# Patient Record
Sex: Female | Born: 1950 | Race: White | Hispanic: No | Marital: Married | Smoking: Current every day smoker
Health system: Southern US, Community
[De-identification: ages and names within clinical notes are randomized; demographics above are authoritative.]

## PROBLEM LIST (undated history)

## (undated) DIAGNOSIS — I509 Heart failure, unspecified: Secondary | ICD-10-CM

## (undated) DIAGNOSIS — F1010 Alcohol abuse, uncomplicated: Secondary | ICD-10-CM

## (undated) DIAGNOSIS — J189 Pneumonia, unspecified organism: Secondary | ICD-10-CM

## (undated) HISTORY — PX: COLON RESECTION: SHX5231

## (undated) HISTORY — PX: COLOSTOMY: SHX63

---

## 2003-03-21 ENCOUNTER — Inpatient Hospital Stay (HOSPITAL_COMMUNITY): Admission: EM | Admit: 2003-03-21 | Discharge: 2003-04-04 | Payer: Self-pay | Admitting: Emergency Medicine

## 2003-03-21 ENCOUNTER — Encounter (INDEPENDENT_AMBULATORY_CARE_PROVIDER_SITE_OTHER): Payer: Self-pay

## 2003-04-07 ENCOUNTER — Ambulatory Visit (HOSPITAL_COMMUNITY): Admission: RE | Admit: 2003-04-07 | Discharge: 2003-04-07 | Payer: Self-pay | Admitting: General Surgery

## 2003-06-13 ENCOUNTER — Emergency Department (HOSPITAL_COMMUNITY): Admission: EM | Admit: 2003-06-13 | Discharge: 2003-06-13 | Payer: Self-pay | Admitting: Emergency Medicine

## 2003-07-08 ENCOUNTER — Ambulatory Visit (HOSPITAL_COMMUNITY): Admission: RE | Admit: 2003-07-08 | Discharge: 2003-07-08 | Payer: Self-pay | Admitting: Surgery

## 2003-08-15 ENCOUNTER — Encounter (INDEPENDENT_AMBULATORY_CARE_PROVIDER_SITE_OTHER): Payer: Self-pay | Admitting: *Deleted

## 2003-08-15 ENCOUNTER — Inpatient Hospital Stay (HOSPITAL_COMMUNITY): Admission: RE | Admit: 2003-08-15 | Discharge: 2003-08-23 | Payer: Self-pay | Admitting: Surgery

## 2006-09-04 ENCOUNTER — Inpatient Hospital Stay (HOSPITAL_COMMUNITY): Admission: EM | Admit: 2006-09-04 | Discharge: 2006-09-07 | Payer: Self-pay | Admitting: Emergency Medicine

## 2010-01-11 ENCOUNTER — Emergency Department (HOSPITAL_COMMUNITY): Admission: EM | Admit: 2010-01-11 | Discharge: 2010-01-11 | Payer: Self-pay | Admitting: Emergency Medicine

## 2010-06-20 LAB — URINALYSIS, ROUTINE W REFLEX MICROSCOPIC
Bilirubin Urine: NEGATIVE
Glucose, UA: 100 mg/dL — AB
Ketones, ur: 15 mg/dL — AB
Leukocytes, UA: NEGATIVE
Nitrite: NEGATIVE
Protein, ur: 100 mg/dL — AB
Specific Gravity, Urine: 1.015 (ref 1.005–1.030)
Urobilinogen, UA: 1 mg/dL (ref 0.0–1.0)
pH: 7.5 (ref 5.0–8.0)

## 2010-06-20 LAB — URINE MICROSCOPIC-ADD ON

## 2010-06-20 LAB — DIFFERENTIAL
Basophils Absolute: 0 K/uL (ref 0.0–0.1)
Basophils Relative: 0 % (ref 0–1)
Eosinophils Absolute: 0 K/uL (ref 0.0–0.7)
Eosinophils Relative: 0 % (ref 0–5)
Lymphocytes Relative: 8 % — ABNORMAL LOW (ref 12–46)
Lymphs Abs: 0.6 K/uL — ABNORMAL LOW (ref 0.7–4.0)
Monocytes Absolute: 0.2 K/uL (ref 0.1–1.0)
Monocytes Relative: 3 % (ref 3–12)
Neutro Abs: 7.1 K/uL (ref 1.7–7.7)
Neutrophils Relative %: 89 % — ABNORMAL HIGH (ref 43–77)

## 2010-06-20 LAB — CBC
HCT: 47.2 % — ABNORMAL HIGH (ref 36.0–46.0)
Hemoglobin: 16.6 g/dL — ABNORMAL HIGH (ref 12.0–15.0)
MCH: 34.6 pg — ABNORMAL HIGH (ref 26.0–34.0)
MCHC: 35.2 g/dL (ref 30.0–36.0)
MCV: 98.2 fL (ref 78.0–100.0)
Platelets: 286 K/uL (ref 150–400)
RBC: 4.8 MIL/uL (ref 3.87–5.11)
RDW: 13.1 % (ref 11.5–15.5)
WBC: 7.9 K/uL (ref 4.0–10.5)

## 2010-06-20 LAB — BASIC METABOLIC PANEL WITH GFR
BUN: 1 mg/dL — ABNORMAL LOW (ref 6–23)
CO2: 24 meq/L (ref 19–32)
Calcium: 9.5 mg/dL (ref 8.4–10.5)
Chloride: 92 meq/L — ABNORMAL LOW (ref 96–112)
Creatinine, Ser: 0.47 mg/dL (ref 0.4–1.2)
GFR calc non Af Amer: >60 mL/min
Glucose, Bld: 157 mg/dL — ABNORMAL HIGH (ref 70–99)
Potassium: 3.6 meq/L (ref 3.5–5.1)
Sodium: 133 meq/L — ABNORMAL LOW (ref 135–145)

## 2010-08-21 NOTE — H&P (Signed)
Rachel Williams, Rachel Williams                  ACCOUNT NO.:  [id number]   MEDICAL RECORD NO.:  [id number]          PATIENT TYPE:  EMS   LOCATION:  ED                           FACILITY:  [facility]   PHYSICIAN:  Ellie Lunch, M.D.      DATE OF BIRTH:  12-Jul-1950   DATE OF ADMISSION:  09/04/2006  DATE OF DISCHARGE:                              HISTORY & PHYSICAL   PRIMARY CARE PHYSICIAN:  Dr. Andrey Campanile, who has retired.   REASON FOR ADMISSION:  Nausea, vomiting and severe hyponatremia.   Rachel Williams is a 60 year old white female who was actually an employee of  [employer] that comes to the emergency room with a two day  history of severe nausea and vomiting with unable to keep anything down  except for a certain amount of water.  This had an abrupt onset on  Tuesday morning and since then, has been getting worse.  The patient  also has associated dull fevers, however, no chills.  She denies any  abdominal pain, any diarrhea, however, is a little bit constipated.  She  denies any icterus or any history of recent travel.  She is the only one  in her household to have these current symptoms.  This morning the  patient started noticing tremors in both of her hands as well as noticed  that her speech was getting slurred and thus came to the emergency room  for further evaluation.   PAST MEDICAL HISTORY:  Significant only for history of diverticulitis  with a diverticular bleed back in 2004, that required partial resection  of the colon.   ALLERGIES:  NO KNOWN DRUG ALLERGIES.   MEDICATIONS:  The patient is not on any current medications.   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  She is a current smoker and smokes one pack per day  for the past 35 years.  No history of drug use.  She has occasional  alcohol.   REVIEW OF SYSTEMS:  As per HPI.   PHYSICAL EXAMINATION:  VITAL SIGNS: On admission temperature 99.8, blood  pressure 171/105, pulse  122, respiration 20, O2 sats 100% on room air.  GENERAL APPEARANCE:  The patient does not appear to be in any acute  distress.  HEENT:  Pupils are equal, round, reactive to light and accommodation.  No icterus, no pallor.  Oropharynx is clear,  however, extremely dry-  appearing.  NECK:  Supple.  There is no JVD.  CARDIOVASCULAR:  Tachycardia with a regular rate.  No murmurs.  RESPIRATORY:  Clear to auscultation bilaterally.  No rales, rhonchi or  wheezing.  ABDOMEN:  Soft, nontender, nondistended.  Positive bowel sounds.  EXTREMITIES:  No clubbing, cyanosis or edema.   ADMISSION LABS:  A white count of 8.3, hemoglobin 16.7, platelets 308.  Sodium of 120, potassium 3.9, chloride 80, bicarb 20, glucose 119, BUN  8, creatinine 0.66. Total bilirubin 1.9, alkaline phosphatase 68, AST  72, ALT 57, total protein 7.8.  Lipase was normal at 18.  Urinalysis was  normal.   ASSESSMENT/PLAN:  1. Severe nausea and vomiting, most likely secondary to dehydration.      This seems most likely secondary to a viral gastroenteritis.      Lipase level was normal.  ASTs were slightly elevated, thus will go      ahead and get the right upper quadrant ultrasound to make sure      there is no component of cholecystitis, however, unlikely given the      absence of abdominal pain.  We will treat her with IV fluids and      p.r.n. Zofran.  2. Hyponatremia.  This seems to be most likely secondary to hydration      for nausea and vomiting.  Will gently correct her sodium at a rate      no greater than 0.5 mEq every hour.  The patient has already      received a fluid bolus in the emergency room and she is currently      on normal saline.  I do not think the patient needs any 3% normal      saline given absence of any neurological symptoms currently      especially seizures and altered mental status.  Will also check a      TSH level.  If the sodium does not correct in the next 48 hours      with hydration, then  consider evaluation for syndrome of      inappropriate antidiuretic hormone.  3. Elevated liver function tests.  Will go ahead and get a hepatitis      panel as well as right upper quadrant and then evaluate based on      those results.  4. Anion gap acidosis.  Note, glucose is 119, which is normal.  Will      go ahead and obtain a lactic acid level.  The patient denies any      history of excessive aspirin use or any alcohol use, including      atypical alcohol,  methanol and ethylene glycol.  Will monitor her      BMET closely and hydrate her and recheck again in the morning.      Ellie Lunch, M.D.  Electronically Signed     BP/MEDQ  D:  09/04/2006  T:  09/04/2006  Job:  [id number]

## 2010-08-21 NOTE — Discharge Summary (Signed)
Rachel Williams, ARDITO                  ACCOUNT NO.:  [id number]   MEDICAL RECORD NO.:  [id number]          PATIENT TYPE:  INP   LOCATION:  1442                         FACILITY:  [facility]   PHYSICIAN:  Isidor Holts, M.D.  DATE OF BIRTH:  [birth year]   DATE OF ADMISSION:  09/04/2006  DATE OF DISCHARGE:  09/07/2006                               DISCHARGE SUMMARY   PMD:  Previously Dr. Andrey Campanile, now retired.  Currently, Dr. Bradd Canary.   DISCHARGE DIAGNOSES:  1. Acute gastritis, likely viral in etiology.  2. Hyponatremia, secondary to volume depletion, secondary to #1 above.  3. Abnormal liver function tests (LFTs), unclear etiology.  Normal      ultrasound scan.  4. Smoking history.  5. Hypertension.  6. History of diverticulosis.   DISCHARGE MEDICATIONS:  1. Nicoderm CQ patch (21 mg/24 hours) one patch to skin daily.  2. Norvasc 10 mg p.o. daily.  3. Lopressor 12.5 mg p.o. b.i.d.   PROCEDURES:  1. Abdominal/chest x-ray dated 08/31/2006.  This showed no acute      cardiopulmonary findings.  There was moderate constipation.  No      acute abdominal findings.  2. Abdominal ultrasound scan dated 09/04/2006.  This showed normal      gallbladder and biliary tree.  Limited visualization of the      pancreas.  No other findings of significance.   CONSULTATIONS:  None.   ADMISSION HISTORY:  As in H&P note of 09/04/2006 dictated by Dr. Ellie Lunch. However, in brief, this is a 60 year old female with a past  medical history significant only for diverticulosis with diverticular  bleed in 2004, status post partial colonic resection, who presents with  a two day history of severe nausea and vomiting.  No particular culprit  foods, no clustering of symptoms.  No history of recent travel.  In a.m.  of presentation, the patient started noticing tremors of both hands as  well as slurred speech and presented at the Emergency Department.  She  was admitted for the evaluation and investigation and  management.   CLINICAL COURSE:  1. Acute Gastritis.  For details of presentation, refer to admission      history above.  This was deemed likely viral in etiology.  The      patient was managed with bowel rest, intravenous fluid hydration,      antiemetics and proton pump inhibitor, with satisfactory clinical      response.  By 09/06/2006, no further episodes of vomiting were      noted.  We were able to successfully advance the patient's diet,      without any deleterious effects. Lipase level was normal at 18,      effectively ruling out acute pancreatitis.   1. Abnormal LFTs.  At the time of presentation, the patient was noted      to have AST of 72, ALT 57, alkaline phosphatase 68, total bilirubin      of 1.9.  Etiology is unclear.  Abdominal ultrasound scan dated      09/04/2006, showed no abnormal intra-abdominal findings.  Viral      hepatitis A, B, and C serology have been ordered, however, at the      time of this dictation, results were still pending.   1. Profound hyponatremia.  The patient at the time of presentation had      a profound hyponatremia of 120 and this was likely responsible for      tremors and slurred speech, and may also have been contributory to      nausea and vomiting.  The etiology, is likely volume depletion,      against a background of acute gastritis.  Of note, the patient's      chloride was 80.  She was managed with intravenous infusion of      normal saline, and by 09/06/2006, sodium was 133.  We were      therefore able to discontinue iv saline, particularly as serum BUN      was 1 with a creatinine of 0.55, at this point.  We are happy to      note that on 09/07/2006 sodium was 136 and the patient has no      further symptomatology.   1. Hypertension.  This is a new diagnosis.  At the time of initial      presentation, the patient was found to have a blood pressure of      171/105.  She has no prior history of hypertension.  She was       managed with a combination of calcium channel inhibitor as well as      beta blocker, with satisfactory response. We shall defer monitoring      of her blood pressure to her primary MD, who will adjust      antihypertensive medications, as indicated.   1. Smoking history.  The patient is a smoker, about one packet of      cigarettes per day and has done so for the past 25 years.  She has      been counseled appropriately, and placed on Nicoderm CQ patch.   DISPOSITION:  The patient was considered sufficiently recovered and  clinically stable to be discharged in satisfactory condition, on  09/07/2006.  She was recommended to return to regular duties on  09/10/2006.   Activity: As tolerated.   Diet:  No restrictions.   FOLLOW UP INSTRUCTIONS:  The patient was instructed to follow up with  her new PMD, Dr. Bradd Canary, within two weeks of discharge.  She is to  call for an appointment.      Isidor Holts, M.D.  Electronically Signed     CO/MEDQ  D:  09/07/2006  T:  09/07/2006  Job:  [id number]   cc:   Quita Skye. Artis Flock, M.D.  Fax: [phone]

## 2010-08-24 NOTE — H&P (Signed)
NAME:  Rachel Williams, Rachel Williams                            ACCOUNT NO.:  [id number]   MEDICAL RECORD NO.:  [id number]                   PATIENT TYPE:  EMS   LOCATION:  ED                                   FACILITY:  [facility]   PHYSICIAN:  Currie Paris, M.D.           DATE OF BIRTH:  1950/09/06   DATE OF ADMISSION:  03/21/2003  DATE OF DISCHARGE:                                HISTORY & PHYSICAL   CHIEF COMPLAINT:  Abdominal pain, nausea, and vomiting.   HISTORY OF PRESENT ILLNESS:  Ms. Tarnow is a 60 year old lady who presents  from Dr. Andrey Campanile to the ER with abdominal pain.  Today is Monday.  Thursday  previous she developed some abdominal distention, some nausea followed by  some diarrhea and vomiting.  Abdomen became progressively swollen and  distended and she has persisted with nausea and watery diarrhea.  She was  seen yesterday at an urgent care and apparently told she had diverticulitis  and given some antibiotics and sent out for follow-up.  She presented to Dr.  Andrey Campanile today continuing with diarrhea and noticing some blood in her stools.  She has had no significant fevers or chills.  Her abdomen continues to be  uncomfortable but it is very diffuse and not localized.  She has had no  urinary tract symptoms.  The pain is not particularly made better or worse  by moving around.  She notes she had some x-rays yesterday and we got some  more just prior to my seeing her today.   PAST SURGICAL HISTORY:  None.   MEDICATIONS:  She has taken some Celebrex and apparently was given some  antibiotics (Cipro) yesterday with the diagnosis of diverticulitis.  Apparently, she was also given a prescription for Flagyl.   ALLERGIES:  PENICILLIN which causes a rash.   HABITS:  Smokes about one pack per day.  Alcohol.  Two or three drinks per  day.   SOCIAL HISTORY:  She is married.  Works at [employer] in [employer].   REVIEW OF SYSTEMS:  HEENT:  Basically negative.  CHEST:  No  cough or  shortness of breath.  HEART:  Negative.  ABDOMEN:  Negative except for HPI.  GENITOURINARY:  Negative.  Three childbirths without complications.  EXTREMITIES:  Recently had some problems with peripheral edema but that  seems improved now.   FAMILY HISTORY:  Noncontributory.   PHYSICAL EXAMINATION:  GENERAL:  The patient is alert, oriented, appears  somewhat uncomfortable.  VITAL SIGNS:  Temperature 100, blood pressure 125/82, pulse 129,  respirations 24.  HEENT:  Head is normocephalic.  Eyes:  Nonicteric.  Pupils are equal, round,  and regular.  EOMs intact.  Pharynx and mucous membranes do seem dry.  No  lesions noted.  NECK:  Supple.  There is no masses or thyromegaly.  LUNGS:  Normal respirations.  Clear to auscultation.  HEART:  Regular  rhythm.  No murmurs, rubs, or gallops are heard.  Pulses  intact carotid/femoral.  ABDOMEN:  Distended.  Seems to be soft.  Diffusely a little bit tender, but  no real rigidity or guarding.  No rebound.  Bowel sounds are present.  RECTAL:  Not done.  EXTREMITIES:  No cyanosis or edema today.  Good range of motion.   DATA REVIEWED:  Her laboratories yesterday showed a white count of 21,000.  That was apparently the only laboratory yesterday.  Today's chest x-ray is  unremarkable.  KUB and upright show a couple of dilated loops of small  bowels, perhaps a little bit of a ground glass appearance suggesting perhaps  either fluid or fluid filled loops of bowel.  No other evidence of problem.  Laboratories pending.   IMPRESSION:  Acute abdominal pain, nausea, vomiting, and apparent volume  depletion.   PLAN:  Await further workup perhaps is best since this may be more of a  gastroenteritis than an appendicitis or diverticulitis.  She may need CT  scan and also GI consultation.                                               Currie Paris, M.D.    CJS/MEDQ  D:  03/21/2003  T:  03/21/2003  Job:  [id number]

## 2010-08-24 NOTE — Op Note (Signed)
NAME:  Rachel Williams, Rachel Williams                            ACCOUNT NO.:  [id number]   MEDICAL RECORD NO.:  [id number]                   PATIENT TYPE:  INP   LOCATION:  0102                                 FACILITY:  [facility]   PHYSICIAN:  Currie Paris, M.D.           DATE OF BIRTH:  [birth year]   DATE OF PROCEDURE:  03/21/2003  DATE OF DISCHARGE:                                 OPERATIVE REPORT   PREOPERATIVE DIAGNOSIS:  Perforated viscus.   POSTOPERATIVE DIAGNOSES:  Perforated sigmoid diverticulitis with a diffuse  and extensive peritonitis.   OPERATION:  1. Exploratory laparotomy.  2. Sigmoid colectomy.  3. End colostomy, Hartmann closure.  4. Incidental appendectomy.  5. Debridement of peritoneum.   SURGEON:  Currie Paris, M.D.   ASSISTANT:  Gita Kudo, M.D.   ANESTHESIA:  General endotracheal.   CLINICAL HISTORY:  This patient is a 60 year old lady, who has had about a  four day history of nausea, vomiting, and diarrhea which was initially  watery and now bloody today.  She had had increasing abdominal pain and  distention, very diffuse, and she has never had any localized pain at all.  Her initial symptoms were move of the vomiting and diarrhea, followed by the  pain.  On exam, she had diffusely tender abdomen, although not a lot of  rigidity.  CT scan showed a few flecks of free air and a fair amount of  fluid suggestive of a bowel perforation.  She had already been started on  some antibiotics and was then taken to the operating room for exploration.   DESCRIPTION OF PROCEDURE:  The patient was brought to the operating room,  having no further questions.  She understood that she might need a colostomy  if we found perforated diverticulitis.  After satisfactory general  endotracheal anesthesia had been obtained, a Foley catheter was placed and  the abdomen was prepped and draped.  A midline incision was made from just  above the umbilicus to bout midway down the  lower abdomen.  Upon entering,  there was a large amount of very purulent material which was not foul  smelling.  Cultures were taken.  Most of the process appeared to be in the  lower half of the abdomen, so the incision was extended.  The bowel was  pulled out and run, and small bowel had diffuse exudate on it, and there was  a lot of purulent fluid collection and pus that was just up against the  anterior abdominal wall and bathing the uterus and both ovaries.  This was  all suctioned out.  Once I had this suctioned out, I went ahead and ran the  small bowel twice to make sure there was no small bowel perforations and in  the meantime while doing that debrided as much exudate as possible off of  the small bowel.  Upon doing that, I find no evidence  of small bowel  problems.  We checked the appendix, and it appeared to be normal.  Feeling  of the ascending and transverse colon, this appeared to be normal, although  there was exudate on the omentum which we debrided off.  Down in the pelvis,  the uterus, tubes, and ovaries were markedly inflamed but did not appear to  be the primary source.  The proximal sigmoid appeared to have diverticular  disease but no diverticulitis but as we got distal down in towards the  rectosigmoid, found the area of perforation where it apparently had sealed  up against the uterus and then opened back up again.  This was the source of  the abdominal sepsis.   Once this was identified, I decided to go ahead and do a sigmoid colectomy  with a colostomy.  I found an area of the proximal sigmoid which was  relatively free of any tics and also fairly pliable and divided it with a  GIA.  The mesentery was then divided sequentially between clamps from that  point down to the rectosigmoid which the perforation appeared to be fairly  close to the junction of the rectum and sigmoid.  Once I got down beyond  that, I was able to divide the rectum with the GIA again.  I  put two Prolene  sutures on to identify the ends.  We had what appeared to be a nice viable  proximal end as well as distal end.  I stayed close to the bowel so we were  really not deep into the mesentery and well away from the area of the ureter  and did not really mobilize the peritoneum to inspect over there, as I was  well away.   I then spent a long time irrigating and debriding some additional material.  We used about five liters of irrigation total.  About halfway through, we  stopped and went ahead and divided the mesoappendix between clamps, ligated  with 2-0 silk, and divided the appendix with the GIA simply to eliminate  that this was the source of problems since it was also markedly inflamed, at  least on the exterior.   Once we had everything completely irrigated and checked over both liver and  splenic areas and in those areas to make sure we did not have a purulent  collection, we went ahead and took a button of skin for the left lower  quadrant, opened the fascia, made an incision in the abdominal cavity, and  brought the end of the colon out through this several centimeters.  This was  fixed with a suture in the mesentery to keep that further pulling out.  Everything appeared to be dry at this point.  I did try to leave some  Seprafilm along where the rectum had been closed off to see if we could  diminish a little bit the adhesions down there for when we came back.  The  omentum was pulled down over the anterior abdominal wall, and it was closed.  There was a fair amount of swelling of the bowel and a lot of tension here,  so I put two retention sutures in as well.  We also tried to put small bowel  contents back into the stomach and get them suctioned out by the NG.  We had  confirmed the position of the NG manually.   The abdomen was closed with a running #1 PDS in addition to the retentions. Skin was loosely approximated with  some staples, and some Telfa wick was   placed between.  The colostomy was matured with some 4-0 chromics.   The patient was stable throughout.  Estimated blood loss was about 100 mL.  There were no operative complications.  All counts were correct.                                               Currie Paris, M.D.    CJS/MEDQ  D:  03/21/2003  T:  03/21/2003  Job:  [id number]   cc:   Venita Lick. Russella Dar, M.D. [facility] C. Andrey Campanile, M.D.  [address]  [place]  [place] [address]  Fax: [phone]

## 2010-08-24 NOTE — Op Note (Signed)
NAME:  Rachel Williams, Rachel Williams                            ACCOUNT NO.:  [id number]   MEDICAL RECORD NO.:  [id number]                   PATIENT TYPE:  INP   LOCATION:  X007                                 FACILITY:  [facility]   PHYSICIAN:  Currie Paris, M.D.           DATE OF BIRTH:  06-22-1950   DATE OF PROCEDURE:  08/15/2003  DATE OF DISCHARGE:                                 OPERATIVE REPORT   PREOPERATIVE DIAGNOSES:  Sigmoid colostomy with Hartmann status post sigmoid  colectomy for diverticulitis.   POSTOPERATIVE DIAGNOSES:  Sigmoid colostomy with Hartmann status post  sigmoid colectomy for diverticulitis.  Ongoing diverticulosis.   OPERATION:  Takedown of colostomy with partial colectomy, takedown of  splenic flexure and low anterior anastomosis.   SURGEON:  Currie Paris, M.D.   ASSISTANT:  Leonie Man, M.D.   ANESTHESIA:  General endotracheal.   CLINICAL COURSE:  This patient is a 60 year old lady who presented with a  perforated diverticulitis several months ago and underwent partial sigmoid  colectomy with colostomy and Hartmann.  After a fairly lengthy  hospitalization, she did well and is now brought back for reanastomosis.  Preoperative barium study have shown a few tics in the rectal stump near the  end where it was stapled closed and a few more tics in the remaining  descending and sigmoid colon.   DESCRIPTION OF PROCEDURE:  The patient was seen in the holding area and had  no further questions.  She was taken to the operating room and after  satisfactory general endotracheal anesthesia had been obtained was placed in  yellow fin stirrups. The abdomen was clipped, prepped and draped and a Foley  catheter placed.  The old scar was utilized and the abdomen entered through  the midline going slightly above the old scar to get in. There was a still a  fair amount of woodiness to the fascia below the umbilicus but very few  adhesions to the midline which were easily  taken down. There were a few more  adhesions of the small bowel into the pelvis and on the uterus but again  very minimal and came up very  nicely. The colostomy was freed up and  inspected.  At this point, I went ahead and freed up the area around the  colostomy and divided it between Kocher clamps. I freed up the descending  colon and found a few more fairly large tics higher up and which I thought I  would need to resect them.   With retractors in place, I therefore took down the entire splenic flexure  which came down very easily and was primarily done with cautery so that I  had very good mobility and could resect several more inches of the distal  end of the colon which was then done between Kocher clamps after dividing  the mesentery and tying with 2-0 silks.   Attention was turned to  the pelvis.  Both tube and ovary areas were stuck to  the rectal stump and as was the uterus these were readily freed up, the  rectal stump grasped.  It was freed up posteriorly for several inches down  into the sacral hollow and then laterally.  The mesocolon was divided  between clamps and divided with 2-0 silks and I went down to just below the  peritoneal reflection posteriorly and right at the peritoneal reflection  anteriorly so that I thought I was well below any residual diverticular  disease. I used a curved stapler device and I closed off the rectal stump  dividing it and thus resecting it.   I made sure that we had excellent mobility of the proximal section and  excellent viability and all looked fine and easily came down into the  rectum. The closed end of the proximal colon was then reclamped with a  pursestring device, pursestring placed.  The #29 stapler was employed and I  put the anvil into the colon and tied the pursestring down.  Dr. Lurene Shadow, who  was the assistant, went below and cleaned out the rectum with some old mucus  and barium and then inserted the EEA stapler. I guided it  from above and we  brought the trocar out the middle of the staple line.  The anvil was  attached, the stapler closed and fired, this went very nicely and again this  was very lax and no tension.   Dr. Lurene Shadow proctoscoped the patient, there was no bleeding and the  anastomosis appeared intact. With the abdomen insufflated and some water in  the pelvis, there was no evidence of air leaks. Inspection of the  anastomosis showed everything to be intact.  In looking at the staple rings  both were intact.   We made sure everything was dry.  I tacked the colon mesentery down to the  posterior peritoneum so that there would be no openings for small bowel to  come out.  The duodenum came well across and there was nothing to suggest  that pulling down on the colon would obstruct the proximal duodenum.   After irrigating and again checking for hemostasis, the abdomen was closed  with a #1 running PDS on the fascia for the midline and interrupted #0  Novofil's for the fascia at the old colostomy site. We did do an elliptical  incision around the colon and resect the small segment remaining in the  abdominal wound prior to closing.   The skin was closed with staples.  The patient tolerated the procedure well.  There were no operative complications.  All counts were correct. Estimated  blood loss was 200 mL.                                               Currie Paris, M.D.    CJS/MEDQ  D:  08/15/2003  T:  08/15/2003  Job:  [id number]   cc:   Vale Haven. Andrey Campanile, M.D.  [address]  [place]  [place] [address]  Fax: [phone]

## 2010-08-24 NOTE — Consult Note (Signed)
NAME:  Rachel Williams, Rachel Williams                            ACCOUNT NO.:  [id number]   MEDICAL RECORD NO.:  [id number]                   PATIENT TYPE:  EMS   LOCATION:  ED                                   FACILITY:  [facility]   PHYSICIAN:  Abigail Miyamoto, M.D.              DATE OF BIRTH:  1951-02-26   DATE OF CONSULTATION:  06/13/2003  DATE OF DISCHARGE:                                   CONSULTATION   REFERRING PHYSICIAN:  Vale Haven. Andrey Campanile, M.D.   CHIEF COMPLAINT:  Abdominal pain.   HISTORY OF PRESENT ILLNESS:  Rachel Williams is a pleasant 60 year old female who  is status post perforated diverticulitis, requiring exploratory laparotomy,  colostomy, and Hartman's procedure back in December 2004.  Her course was  complicated by postoperative abscess requiring percutaneous drainage.  She  has now done very well and by her report was doing well until last night,  when after a trip to the beach, started having abdominal pain.  She went to  Dr. Tawana Scale office today with worsening right-sided abdominal pain.  She  reports she had some mild nausea but no emesis.  She has had no jaundice.  She has been eating well and her ostomy had been working well by her report.  She also denies fevers or chills.  She has had no chest pain or shortness of  breath and no dysuria.   PAST MEDICAL HISTORY:  Otherwise unremarkable.   PAST SURGICAL HISTORY:  Otherwise unremarkable.   MEDICATIONS:  She is only on over the counter medications.   ALLERGIES:  She has an allergy to PENICILLIN.   HABITS:  She does smoke and drink alcohol on occasion.   PHYSICAL EXAMINATION:  GENERAL:  Reveals a well-developed, well-nourished  female in no acute distress.  VITAL SIGNS:  She is afebrile.  Her vital signs are stable.  ABDOMEN:  Examination was limited to the abdomen which was soft and  nondistended.  There are costive bowel sounds.  There is a well-healed  midline incision.  She is mildly tender with some guarding to the  right side  of the abdomen.  The ostomy on the left side is well profused and  productive.  There are no hernias.   LABORATORY DATA:  The patient has a normal white blood count of 9.3,  hemoglobin 15.7, hematocrit 420.  Neutrophils are 82%.  CMET, amylase, and  lipase are pending.   Two view of the abdominal x-ray shows a large amount of stool and  constipation in her cecum, ascending colon, and transverse colon.  There is  no dilated small bowel, no evidence of bowel obstruction, no air fluid  levels, and no free air.   IMPRESSION/PLAN:  This is a 60 year old female who apparently has become  constipated, causing her to have abdominal pain.  At this point she is not  ill-appearing with a normal white count.  I do  not feel CAT scan is  warranted at this time.  I do not suspect recurrent abscess.  At this point  we will give her some milk of magnesia to see if this will relieve her  constipation.  I am going to place her on Colace.  If this does not resolve  things, we will  no doubt do an enema through her ostomy.  If she does continue to have  problems we will get a CAT scan also to rule out any recurrent abscess.  At  this point she is very agreeable with this plan and will keep her regular  appointment with Dr. Jamey Ripa.                                               Abigail Miyamoto, M.D.    DB/MEDQ  D:  06/13/2003  T:  06/13/2003  Job:  [id number]   cc:   Vale Haven. Andrey Campanile, M.D.  [address]  [place]  [place] [address]  Fax: [phone]

## 2010-08-24 NOTE — Discharge Summary (Signed)
NAME:  [name], [name]                            ACCOUNT NO.:  [id number]   MEDICAL RECORD NO.:  [id number]                   PATIENT TYPE:  INP   LOCATION:  0472                                 FACILITY:  [facility]   PHYSICIAN:  Currie Paris, M.D.           DATE OF BIRTH:  [birth year]   DATE OF ADMISSION:  08/15/2003  DATE OF DISCHARGE:  08/23/2003                                 DISCHARGE SUMMARY   OFFICE MEDICAL RECORD NUMBER:  [id number].   FINAL DIAGNOSES:  1. Colostomy status post sigmoid colectomy and Hartmann for perforated     diverticulitis.  2. Diverticulosis.  3. Postoperative superficial wound hematoma.  4. Postoperative superficial wound infection.   MEDICAL HISTORY:  [name] was admitted on Aug 15, 2003 for take-down of her  colostomy with partial colectomy.  Preoperative studies had shown some  residual diverticular disease.   HOSPITAL COURSE:  The patient was admitted, taken to the operating room, and  her colostomy taken down.  Distal descending colon resected, along with the  proximal rectum and primary anastomosis done.  This had to be done by taking  down the splenic flexure to get adequate mobility.  She basically had a low  anterior anastomosis done with the stapler.   Postoperatively, the patient had some bleeding from her wound in the  recovery room, thought secondary to a superficial skin bleeder, but this  seemed to controlled with a few additional staples.  However, she did  develop a wound hematoma, and late in the course had to be opened up.  Above  the hematoma, she also developed a superficial wound infection, which had to  be opened up.   Postoperatively, she did pass some blood through her rectum, and was  concerned about that.  Although her hemoglobin had dropped after surgery, it  remained stable from this point, and this was thought to simply be some  blood accumulating in the colon from the staple line, which then was passing  out.  Her  hemoglobin stayed stable.  Her white count remained normal.  By  Aug 19, 2003, she was passing gas, had a little mild postoperative nausea.  I was able to start her on liquids, but had to go very slow because of her  nausea.  On Aug 21, 2003, her wound had to be opened up, as noted above, and  cultures obtained.  However, by Aug 23, 2003, the wounds were clean.  The  lower part had a draining hematoma with some old clot, but this was coming  out.  She had some ecchymosis from that, as well.  She did grow some staph,  and was placed on some Septra.  By Aug 23, 2003, she was able to be  discharged on a solid diet, Tylox for pain, limited activities, and wound  changes by home health care.  We did send her home also on some Septra.  LABORATORY DATA:  Preoperative hemoglobin of 15, and postoperatively around  9.9.  White count, as noted, was normal.  We did stop her perioperative  heparin when she had the wound bleeding, and was maintained on __________  ambulating.  She had a __________ staph in her wound, but it was sensitive  to tetracycline and vancomycin; not tested for Septra.                                               Currie Paris, M.D.    CJS/MEDQ  D:  08/31/2003  T:  08/31/2003  Job:  [id number]   cc:   Vale Haven. Andrey Campanile, M.D.  [address]  [place]  [place] [address]  Fax: [phone]

## 2010-08-24 NOTE — Discharge Summary (Signed)
NAME:  MURL, ZOGG                            ACCOUNT NO.:  [id number]   MEDICAL RECORD NO.:  [id number]                   PATIENT TYPE:  INP   LOCATION:  0465                                 FACILITY:  [facility]   PHYSICIAN:  Currie Paris, M.D.           DATE OF BIRTH:  December 13, 1950   DATE OF ADMISSION:  03/21/2003  DATE OF DISCHARGE:  04/04/2003                                 DISCHARGE SUMMARY   FINAL DIAGNOSES:  1. Diverticulitis of the colon with perforation.  2. Peritonitis secondary to #1.  3. Hypovolemia.  4. Protein-calorie malnutrition.  5. Hypokalemia.  6. History of tobacco use.   CLINICAL HISTORY:  Ms. Demello is a 60 year old who presented with acute  abdominal pain which she developed over a couple of days.  She had been seen  at one of the urgent cares the day before and started on antibiotics for a  presumed diagnosis of diverticulitis.  She presented back with increasing  abdominal pain, diffuse, with marked diarrhea.  Past history is significant  in that she is allergic to PENICILLIN.  She has had a history of taking some  Celebrex and had been apparently started on Cipro and Flagyl the day prior.  On exam she had a markedly distended abdomen which was soft and diffusely  tender but without rigidity or guarding.   HOSPITAL COURSE:  The patient was admitted and CT scan done emergently.  This confirmed the finding of free air with a lot of free fluid and  thickened small-bowel mesentery.  She was given IV fluids, IV antibiotics,  and taken to the operating room.  At the time of surgery a perforated  sigmoid diverticulitis with diffuse peritonitis was found.  A sigmoid  colectomy with an end colostomy and Hartmann closure was performed as well  as an incidental appendectomy.   Postoperatively the patient actually had a fairly stable course.  She had  marked elevation in her white count for a few days, but this gradually came  back down.  Her preoperative sodium  was starting to come back up.  Because  of her several days with not eating and marked infection we elected to go  ahead and start TNA, which was begun on the first postoperative day.  She  was kept on NG suction and kept on IV antibiotics throughout her hospital  course.  On the second postoperative day she was feeling a little bit better  but still had marked abdominal tenderness.  Her stoma was viable.  She had  developed slight hypokalemia with a potassium of 3.4.  Her white count was  coming down to 15,000 and her hypokalemia addressed with her TNA.  The next  couple of days she was kept n.p.o. on TNA.  By the fourth postoperative day  we did plug her NG tube, continued TNA, and pulled her NG tube following  that.  She continued to  run a low-grade fever consistent with her diffuse  peritonitis.  On March 27, 2003, we tried her Foley out, and she was  passing gas via her ostomy.  No nausea with her NG clamped.  Her sodium had  drifted down to 127.  Her albumin was noted at 2.1, confirming marked  malnutrition.  Her NG was discontinued and diet slowly started and gradually  advanced.  By the 9th postoperative day she was tolerating liquids, passing  air and a little bit of liquid stool.  Abdomen remained distended, a little  bit tender, but her wound was healing okay without obvious infection.  Hemoglobin stabilized at 9.8, white count was down to 13,000.  She appeared  to be turning some fluid and was given some Lasix.  A CT was obtained to  check for possible occult abscess developing.  This showed fluid collections  which were subsequently drained but turned out to be sterile and clear fluid  and apparently had not been an abscess.  However, the drain was left in.  The patient was discharged with that in place.  The next couple of days she  gradually improved.  Her oral intake was improving.  Her TNA was weaned.  She still had a little peripheral edema thought, again, secondary to   hypoalbuminemia and a little bit of inactivity.  By April 04, 2003, her  fluid collection had dramatically improved.  It was still present.  We  elected at that point to let her be discharged on April 04, 2003, to  continue her Cipro and Flagyl at home, follow up in my office in about 1  week, follow-up CTs at home.   Pathology report showed diverticulosis associated with acute and chronic  diverticulitis and perforated peridiverticular abscess.  Laboratory studies  this admission included initial hemoglobin of 14.7, white count of 25,000.  This settled down to around 9.8 after volume replenishment.  Just prior to  discharge her hemoglobin was 9.4 with a white count of 4900.  Initial white  count showed greater than 20% bands consistent with her perforated  diverticulitis.  PT/PTT were basically normal.  Electrolytes, as noted,  showed hyponatremia and hypokalemia at admission with a sodium of 124 and  potassium 3.3, but just prior to discharge she had improved up to a sodium  of 133, potassium 3.7.  Cholesterol was noted at 99, triglycerides at 103.  Blood cultures were negative.   EKG showed possible left atrial enlargement, incomplete right bundle branch  block, some nonspecific changes.                                               Currie Paris, M.D.    CJS/MEDQ  D:  04/25/2003  T:  04/25/2003  Job:  [id number]   cc:   Vale Haven. Andrey Campanile, M.D.  [address]  [place]  [place] [address]  Fax: [phone]

## 2011-01-24 LAB — BASIC METABOLIC PANEL
CO2: 30
Chloride: 99
GFR calc Af Amer: >60
Glucose, Bld: 119 — ABNORMAL HIGH
Sodium: 136

## 2014-12-19 ENCOUNTER — Encounter (HOSPITAL_COMMUNITY): Payer: Self-pay

## 2014-12-19 ENCOUNTER — Emergency Department (HOSPITAL_COMMUNITY)
Admission: EM | Admit: 2014-12-19 | Discharge: 2014-12-19 | Disposition: A | Discharge status: Home / Self Care | Payer: Medicare Other | Attending: Emergency Medicine | Admitting: Emergency Medicine

## 2014-12-19 ENCOUNTER — Emergency Department (HOSPITAL_COMMUNITY): Payer: Medicare Other

## 2014-12-19 DIAGNOSIS — Z9889 Other specified postprocedural states: Secondary | ICD-10-CM | POA: Insufficient documentation

## 2014-12-19 DIAGNOSIS — K5900 Constipation, unspecified: Secondary | ICD-10-CM

## 2014-12-19 DIAGNOSIS — Z880 Allergy status to penicillin: Secondary | ICD-10-CM | POA: Diagnosis not present

## 2014-12-19 DIAGNOSIS — Z720 Tobacco use: Secondary | ICD-10-CM | POA: Diagnosis not present

## 2014-12-19 LAB — COMPREHENSIVE METABOLIC PANEL
ALT: 16 U/L (ref 14–54)
AST: 35 U/L (ref 15–41)
Albumin: 3.5 g/dL (ref 3.5–5.0)
Alkaline Phosphatase: 98 U/L (ref 38–126)
Anion gap: 15 (ref 5–15)
BUN: 25 mg/dL — AB (ref 6–20)
CHLORIDE: 78 mmol/L — AB (ref 101–111)
CO2: 33 mmol/L — ABNORMAL HIGH (ref 22–32)
Calcium: 9.3 mg/dL (ref 8.9–10.3)
Creatinine, Ser: 0.76 mg/dL (ref 0.44–1.00)
Glucose, Bld: 125 mg/dL — ABNORMAL HIGH (ref 65–99)
POTASSIUM: 3.1 mmol/L — AB (ref 3.5–5.1)
Sodium: 126 mmol/L — ABNORMAL LOW (ref 135–145)
Total Bilirubin: 1.1 mg/dL (ref 0.3–1.2)
Total Protein: 6.7 g/dL (ref 6.5–8.1)

## 2014-12-19 LAB — CBC WITH DIFFERENTIAL/PLATELET
BASOS ABS: 0 10*3/uL (ref 0.0–0.1)
BASOS PCT: 0 % (ref 0–1)
EOS ABS: 0 10*3/uL (ref 0.0–0.7)
EOS PCT: 0 % (ref 0–5)
HCT: 37.7 % (ref 36.0–46.0)
Hemoglobin: 13.5 g/dL (ref 12.0–15.0)
LYMPHS PCT: 13 % (ref 12–46)
Lymphs Abs: 1.1 10*3/uL (ref 0.7–4.0)
MCH: 34.7 pg — ABNORMAL HIGH (ref 26.0–34.0)
MCHC: 35.8 g/dL (ref 30.0–36.0)
MCV: 96.9 fL (ref 78.0–100.0)
MONO ABS: 0.8 10*3/uL (ref 0.1–1.0)
Monocytes Relative: 9 % (ref 3–12)
Neutro Abs: 6.8 10*3/uL (ref 1.7–7.7)
Neutrophils Relative %: 78 % — ABNORMAL HIGH (ref 43–77)
PLATELETS: 208 10*3/uL (ref 150–400)
RBC: 3.89 MIL/uL (ref 3.87–5.11)
RDW: 13.9 % (ref 11.5–15.5)
WBC: 8.7 10*3/uL (ref 4.0–10.5)

## 2014-12-19 MED ORDER — ONDANSETRON HCL 4 MG/2ML IJ SOLN
4.0000 mg | Freq: Once | INTRAMUSCULAR | Status: AC
  Administered 2014-12-19: 4 mg via INTRAVENOUS
  Filled 2014-12-19: qty 2

## 2014-12-19 MED ORDER — SODIUM CHLORIDE 0.9 % IV BOLUS (SEPSIS)
1000.0000 mL | Freq: Once | INTRAVENOUS | Status: AC
  Administered 2014-12-19: 1000 mL via INTRAVENOUS

## 2014-12-19 MED ORDER — FLEET ENEMA 7-19 GM/118ML RE ENEM
1.0000 | ENEMA | Freq: Once | RECTAL | Status: AC
  Administered 2014-12-19: 1 via RECTAL
  Filled 2014-12-19: qty 1

## 2014-12-19 NOTE — ED Notes (Signed)
Patients is aware urine sample is needed

## 2014-12-19 NOTE — Discharge Instructions (Signed)
Increase fluids, fruits, fibers.   OTC products Miralax, Dulcolax, Magnesium citrate.  Potassium was slightly low.  Eat a banana or orange daily.

## 2014-12-19 NOTE — ED Notes (Signed)
Pt states has had constipation x 9 days.  Small bm on Saturday.  Went to MD Thursday and given mirilax.  Pt states not improving.  Pt also was told she is dehydrated.  No n/v. Eating and drinking like normal. Sodium was 133 in MD office.

## 2014-12-19 NOTE — ED Provider Notes (Signed)
CSN: [id number]     Arrival date & time 12/19/14  1024 History   First MD Initiated Contact with Patient 12/19/14 1041     Chief Complaint  Patient presents with  . Constipation     (Consider location/radiation/quality/duration/timing/severity/associated sxs/prior Treatment) HPI.... Constipation  for 10 days. Patient has had nausea and vomiting for several days with decreased oral intake. She saw her primary care doctor on Thursday and her sodium was low. She feels slightly dehydrated. No fever, sweats, chills, chest pain, dyspnea, dysuria. She is status post remote colon resection.  History reviewed. No pertinent past medical history. Past Surgical History  Procedure Laterality Date  . Colon resection    . Colostomy     History reviewed. No pertinent family history. Social History  Substance Use Topics  . Smoking status: Current Every Day Smoker  . Smokeless tobacco: None  . Alcohol Use: Yes     Comment: daily   OB History    No data available     Review of Systems  All other systems reviewed and are negative.     Allergies  Penicillins and Ciprofloxacin  Home Medications   Prior to Admission medications   Not on File   BP 127/82 mmHg  Pulse 96  Temp(Src) 98.2 F (36.8 C) (Oral)  Resp 18  SpO2 96% Physical Exam  Constitutional: She is oriented to person, place, and time. She appears well-developed and well-nourished.  HENT:  Head: Normocephalic and atraumatic.  Eyes: Conjunctivae and EOM are normal. Pupils are equal, round, and reactive to light.  Neck: Normal range of motion. Neck supple.  Cardiovascular: Normal rate and regular rhythm.   Pulmonary/Chest: Effort normal and breath sounds normal.  Abdominal: Soft. Bowel sounds are normal.  Genitourinary:  Rectal exam: No masses. Excessive stool in rectal vault  Musculoskeletal: Normal range of motion.  Neurological: She is alert and oriented to person, place, and time.  Skin: Skin is warm and dry.   Psychiatric: She has a normal mood and affect. Her behavior is normal.  Nursing note and vitals reviewed.   ED Course  Fecal disimpaction Date/Time: 12/19/2014 3:19 PM Performed by: Donnetta Hutching Authorized by: Donnetta Hutching Consent: Verbal consent obtained. Risks and benefits: risks, benefits and alternatives were discussed Consent given by: patient Patient understanding: patient states understanding of the procedure being performed Patient identity confirmed: verbally with patient Comments: 1500:  Digital disimpaction performed with successful extraction of large amount of brown stool. Fleets enema administered.   (including critical care time) Labs Review Labs Reviewed  CBC WITH DIFFERENTIAL/PLATELET - Abnormal; Notable for the following:    MCH 34.7 (*)    Neutrophils Relative % 78 (*)    All other components within normal limits  COMPREHENSIVE METABOLIC PANEL - Abnormal; Notable for the following:    Sodium 126 (*)    Potassium 3.1 (*)    Chloride 78 (*)    CO2 33 (*)    Glucose, Bld 125 (*)    BUN 25 (*)    All other components within normal limits  URINALYSIS, ROUTINE W REFLEX MICROSCOPIC (NOT AT [facility])    Imaging Review Dg Abd Acute W/chest  12/19/2014   CLINICAL DATA:  Constipation for 9 days.  EXAM: DG ABDOMEN ACUTE W/ 1V CHEST  COMPARISON:  Chest x-ray on 04/29/2011 and abdominal series on 09/04/2006.  FINDINGS: The chest shows evidence of mild bibasilar scarring and atelectasis in the lungs, left greater than right. The heart size is normal. There is  no evidence of pulmonary edema, consolidation, pneumothorax, nodule or pleural fluid.  Abdominal films show moderate stool in the rectum. The rest of the colon appears relatively normal and there is no evidence of small bowel obstruction or significant ileus. No free air is identified. There is a foreign body in the pelvis which was seen previously and represents either an IUD or some type of pessary device. Anastomotic suture  line again noted at the level of the expected position of the proximal rectum. No abnormal calcifications are seen. Bony structures show some interval progression of lumbar spondylosis with vacuum disc present at L3-4 and a slightly more accentuated degenerative scoliosis present, convex left.  IMPRESSION: 1. Moderate stool in the rectum without evidence of overt bowel obstruction or free air. Anastomotic suture line again noted at the level of the proximal rectum. 2. Stable foreign body in the pelvis felt to represent an IUD or pessary. 3. Progression of lumbar spondylosis since the prior abdominal series in 2008.   Electronically Signed   By: Irish Lack M.D.   On: 12/19/2014 11:16   I have personally reviewed and evaluated these images and lab results as part of my medical decision-making.   EKG Interpretation None      MDM   Final diagnoses:  Constipation, unspecified constipation type    Patient feels much better after 2 L of IV fluids and a manual disimpaction. She has primary care follow-up. No acute abdomen.    Donnetta Hutching, MD 12/19/14 1520

## 2014-12-19 NOTE — ED Notes (Signed)
Patient transported to X-ray 

## 2014-12-19 NOTE — ED Notes (Signed)
Patient is aware we need urine- unable to go at this time. Will let us know when she is able.

## 2014-12-19 NOTE — ED Notes (Signed)
MD at bedside. 

## 2014-12-19 NOTE — ED Notes (Signed)
Patient ambulated with moderate assistance-unable to urinate

## 2015-05-23 MED FILL — levoFLOXacin 500 MG TABS: 500 | 10 days supply | Qty: 10 | Fill #0

## 2015-05-24 ENCOUNTER — Emergency Department (HOSPITAL_COMMUNITY): Payer: Medicare Other

## 2015-05-24 ENCOUNTER — Encounter (HOSPITAL_COMMUNITY): Payer: Self-pay | Admitting: Emergency Medicine

## 2015-05-24 ENCOUNTER — Emergency Department (HOSPITAL_COMMUNITY)
Admission: EM | Admit: 2015-05-24 | Discharge: 2015-05-24 | Disposition: A | Discharge status: Home / Self Care | Payer: Medicare Other | Source: Home / Self Care | Attending: Emergency Medicine | Admitting: Emergency Medicine

## 2015-05-24 DIAGNOSIS — E871 Hypo-osmolality and hyponatremia: Secondary | ICD-10-CM | POA: Insufficient documentation

## 2015-05-24 DIAGNOSIS — W19XXXA Unspecified fall, initial encounter: Secondary | ICD-10-CM | POA: Diagnosis present

## 2015-05-24 DIAGNOSIS — K5909 Other constipation: Secondary | ICD-10-CM | POA: Diagnosis present

## 2015-05-24 DIAGNOSIS — J029 Acute pharyngitis, unspecified: Secondary | ICD-10-CM | POA: Diagnosis present

## 2015-05-24 DIAGNOSIS — Y9289 Other specified places as the place of occurrence of the external cause: Secondary | ICD-10-CM | POA: Insufficient documentation

## 2015-05-24 DIAGNOSIS — T378X5A Adverse effect of other specified systemic anti-infectives and antiparasitics, initial encounter: Secondary | ICD-10-CM | POA: Diagnosis present

## 2015-05-24 DIAGNOSIS — Z9049 Acquired absence of other specified parts of digestive tract: Secondary | ICD-10-CM

## 2015-05-24 DIAGNOSIS — E876 Hypokalemia: Secondary | ICD-10-CM | POA: Diagnosis present

## 2015-05-24 DIAGNOSIS — S0993XA Unspecified injury of face, initial encounter: Secondary | ICD-10-CM | POA: Insufficient documentation

## 2015-05-24 DIAGNOSIS — R062 Wheezing: Secondary | ICD-10-CM | POA: Diagnosis present

## 2015-05-24 DIAGNOSIS — Y9389 Activity, other specified: Secondary | ICD-10-CM | POA: Insufficient documentation

## 2015-05-24 DIAGNOSIS — E440 Moderate protein-calorie malnutrition: Secondary | ICD-10-CM | POA: Diagnosis present

## 2015-05-24 DIAGNOSIS — S0083XA Contusion of other part of head, initial encounter: Secondary | ICD-10-CM | POA: Diagnosis present

## 2015-05-24 DIAGNOSIS — I472 Ventricular tachycardia: Secondary | ICD-10-CM | POA: Diagnosis present

## 2015-05-24 DIAGNOSIS — W06XXXA Fall from bed, initial encounter: Secondary | ICD-10-CM

## 2015-05-24 DIAGNOSIS — R296 Repeated falls: Secondary | ICD-10-CM | POA: Diagnosis present

## 2015-05-24 DIAGNOSIS — Z881 Allergy status to other antibiotic agents status: Secondary | ICD-10-CM

## 2015-05-24 DIAGNOSIS — Z933 Colostomy status: Secondary | ICD-10-CM

## 2015-05-24 DIAGNOSIS — S6991XA Unspecified injury of right wrist, hand and finger(s), initial encounter: Secondary | ICD-10-CM | POA: Insufficient documentation

## 2015-05-24 DIAGNOSIS — Z880 Allergy status to penicillin: Secondary | ICD-10-CM

## 2015-05-24 DIAGNOSIS — Z6829 Body mass index (BMI) 29.0-29.9, adult: Secondary | ICD-10-CM

## 2015-05-24 DIAGNOSIS — I471 Supraventricular tachycardia: Secondary | ICD-10-CM | POA: Diagnosis present

## 2015-05-24 DIAGNOSIS — F17200 Nicotine dependence, unspecified, uncomplicated: Secondary | ICD-10-CM

## 2015-05-24 DIAGNOSIS — Y998 Other external cause status: Secondary | ICD-10-CM

## 2015-05-24 DIAGNOSIS — I272 Other secondary pulmonary hypertension: Secondary | ICD-10-CM | POA: Diagnosis present

## 2015-05-24 DIAGNOSIS — L270 Generalized skin eruption due to drugs and medicaments taken internally: Secondary | ICD-10-CM | POA: Diagnosis present

## 2015-05-24 DIAGNOSIS — R531 Weakness: Secondary | ICD-10-CM | POA: Insufficient documentation

## 2015-05-24 DIAGNOSIS — F17210 Nicotine dependence, cigarettes, uncomplicated: Secondary | ICD-10-CM | POA: Diagnosis present

## 2015-05-24 DIAGNOSIS — E860 Dehydration: Secondary | ICD-10-CM | POA: Diagnosis present

## 2015-05-24 DIAGNOSIS — Z9109 Other allergy status, other than to drugs and biological substances: Secondary | ICD-10-CM

## 2015-05-24 DIAGNOSIS — Z820 Family history of epilepsy and other diseases of the nervous system: Secondary | ICD-10-CM

## 2015-05-24 DIAGNOSIS — M4850XA Collapsed vertebra, not elsewhere classified, site unspecified, initial encounter for fracture: Secondary | ICD-10-CM | POA: Diagnosis present

## 2015-05-24 DIAGNOSIS — I071 Rheumatic tricuspid insufficiency: Secondary | ICD-10-CM | POA: Diagnosis present

## 2015-05-24 DIAGNOSIS — Z809 Family history of malignant neoplasm, unspecified: Secondary | ICD-10-CM

## 2015-05-24 LAB — BASIC METABOLIC PANEL
ANION GAP: 15 (ref 5–15)
BUN: 20 mg/dL (ref 6–20)
CALCIUM: 8.8 mg/dL — AB (ref 8.9–10.3)
CO2: 34 mmol/L — ABNORMAL HIGH (ref 22–32)
Chloride: 76 mmol/L — ABNORMAL LOW (ref 101–111)
Creatinine, Ser: 0.81 mg/dL (ref 0.44–1.00)
GLUCOSE: 115 mg/dL — AB (ref 65–99)
POTASSIUM: 2.8 mmol/L — AB (ref 3.5–5.1)
Sodium: 125 mmol/L — ABNORMAL LOW (ref 135–145)

## 2015-05-24 MED ORDER — POTASSIUM CHLORIDE CRYS ER 20 MEQ PO TBCR
60.0000 meq | EXTENDED_RELEASE_TABLET | Freq: Once | ORAL | Status: AC
  Administered 2015-05-24: 60 meq via ORAL
  Filled 2015-05-24: qty 3

## 2015-05-24 MED ORDER — SODIUM CHLORIDE 0.9 % IV BOLUS (SEPSIS)
1000.0000 mL | Freq: Once | INTRAVENOUS | Status: AC
  Administered 2015-05-24: 1000 mL via INTRAVENOUS

## 2015-05-24 MED ORDER — SODIUM CHLORIDE 0.9 % IV BOLUS (SEPSIS): 1000.0000 mL | Freq: Once | INTRAVENOUS | Status: DC

## 2015-05-24 MED FILL — IBUPROFEN 800 MG TABLET: 800 | 10 days supply | Qty: 30 | Fill #0

## 2015-05-24 NOTE — ED Provider Notes (Signed)
CSN: [id number]     Arrival date & time 05/24/15  0805 History   First MD Initiated Contact with Patient 05/24/15 [phone]     Chief Complaint  Patient presents with  . Wrist Injury     (Consider location/radiation/quality/duration/timing/severity/associated sxs/prior Treatment) HPI Patient presents with concern of weakness and a fall. She is here with her daughter who assists with the history of present illness. This past week, patient has had worsening generalized weakness, without new abdominal pain, chest pain, syncope, vomiting, diarrhea. Patient has history of colon resection, chronic malabsorption. Patient saw her physician yesterday, was diagnosed with hyponatremia, possible pneumonia. Since yesterday she has been drinking additional fluids, hasn't started a course of Levaquin today. Today, just prior to ED arrival the patient had a mechanical fall from bed height. She fell onto her right face, right wrist. No loss of consciousness, no subsequent confusion, disorientation, inability to use the hand or wrist. Pain is sore in both areas, moderate. No medication taken for pain relief.   History reviewed. No pertinent past medical history. Past Surgical History  Procedure Laterality Date  . Colon resection    . Colostomy     No family history on file. Social History  Substance Use Topics  . Smoking status: Current Every Day Smoker  . Smokeless tobacco: None  . Alcohol Use: Yes     Comment: daily   OB History    No data available     Review of Systems  Constitutional:       Per HPI, otherwise negative  HENT:       Per HPI, otherwise negative  Respiratory:       Per HPI, otherwise negative  Cardiovascular:       Per HPI, otherwise negative  Gastrointestinal: Negative for vomiting.  Endocrine:       Negative aside from HPI  Genitourinary:       Neg aside from HPI   Musculoskeletal:       Per HPI, otherwise negative  Skin: Positive for color change.    Neurological: Positive for weakness. Negative for syncope.      Allergies  Penicillins and Ciprofloxacin  Home Medications   Prior to Admission medications   Not on File   BP 121/91 mmHg  Pulse 96  Temp(Src) 98.1 F (36.7 C) (Oral)  Resp 18  SpO2 99% Physical Exam  Constitutional: She is oriented to person, place, and time. She appears well-developed and well-nourished. She has a sickly appearance. No distress.  HENT:  Head: Normocephalic and atraumatic.    Eyes: Conjunctivae and EOM are normal.  No evidence for entrapment of the extraocular muscles, with full range of motion, reactivity  Cardiovascular: Normal rate and regular rhythm.   Pulmonary/Chest: Effort normal and breath sounds normal. No stridor. No respiratory distress.  Abdominal: She exhibits no distension. There is no tenderness.  Musculoskeletal: She exhibits no edema.       Right wrist: She exhibits tenderness and bony tenderness. She exhibits normal range of motion, no effusion and no crepitus.  Neurological: She is alert and oriented to person, place, and time. No cranial nerve deficit.  Skin: Skin is warm and dry.  Psychiatric: She has a normal mood and affect.  Nursing note and vitals reviewed.   ED Course  Procedures (including critical care time) Labs Review Labs Reviewed  BASIC METABOLIC PANEL - Abnormal; Notable for the following:    Sodium 125 (*)    Potassium 2.8 (*)    Chloride  76 (*)    CO2 34 (*)    Glucose, Bld 115 (*)    Calcium 8.8 (*)    All other components within normal limits    Imaging Review Dg Wrist Complete Right  05/24/2015  CLINICAL DATA:  Pain following fall EXAM: RIGHT WRIST - COMPLETE 3+ VIEW COMPARISON:  None. FINDINGS: Frontal, oblique, lateral, and ulnar deviation scaphoid images were obtained. There is no demonstrable fracture or dislocation. Bones are diffusely osteoporotic. There is marked osteoarthritic change in the scaphotrapezial and first carpal -metacarpal  joints. There is also narrowing of the radiocarpal joint. There is calcification in the triangular fibrocartilage region. IMPRESSION: No acute fracture or dislocation. 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 Calcification in the triangular fibrocartilage region may have degenerative etiology but also may be indicative of chronic tear in this region. Electronically Signed   By: Bretta Bang III M.D.   On: 05/24/2015 08:26   I have personally reviewed and evaluated these images and lab results as part of my medical decision-making.  After the initial eval, the family requests IVF, labs  11:03 AM Patient and daughter aware of all results, including hyponatremia, hypokalemia. I offered admission for fluid resuscitation, monitoring. Patient deferred, states that she would like to complete 1 L fluid resuscitation, follow-up with primary care.  MDM  Patient presents with ongoing weakness, and following a fall that occurred just prior to ED arrival. Here the patient is awake, alert, appropriately interactive. She is hemodynamically stable. Patient was seen yesterday by her physician, knows that she is hyponatremic, is attempting fluid resuscitation at home. Here, patient is found to have persistent hyponatremia. Patient had preference for discharge, with close outpatient follow-up. X-rays unremarkable. Patient discharged after fluid resuscitation to follow-up with primary care.  Gerhard Munch, MD 05/24/15 1104

## 2015-05-24 NOTE — ED Notes (Signed)
Per EMS- fell out of bed this am-having right wrist pain-no LOC-no deformity

## 2015-05-24 NOTE — ED Notes (Signed)
MD aware of BP, Says pt may be DC if requested

## 2015-05-24 NOTE — Discharge Instructions (Signed)
As discussed, it is important that you monitor your condition carefully.  Drink plenty of fluids, like Gatorade.  Please be sure to see your physician within the next 5 days for repeat evaluation and a blood test to insure that your electrolyte abnormalities have resolved.  Return here for concerning changes in your condition.

## 2015-05-27 ENCOUNTER — Emergency Department (HOSPITAL_COMMUNITY): Payer: Medicare Other

## 2015-05-27 ENCOUNTER — Inpatient Hospital Stay (HOSPITAL_COMMUNITY)
Admission: EM | Admit: 2015-05-27 | Discharge: 2015-05-30 | DRG: 641 | Disposition: A | Discharge status: Home Health | Payer: Medicare Other | Attending: Internal Medicine | Admitting: Internal Medicine

## 2015-05-27 ENCOUNTER — Encounter (HOSPITAL_COMMUNITY): Payer: Self-pay | Admitting: Emergency Medicine

## 2015-05-27 ENCOUNTER — Inpatient Hospital Stay (HOSPITAL_COMMUNITY): Payer: Medicare Other

## 2015-05-27 DIAGNOSIS — F17210 Nicotine dependence, cigarettes, uncomplicated: Secondary | ICD-10-CM | POA: Diagnosis present

## 2015-05-27 DIAGNOSIS — J029 Acute pharyngitis, unspecified: Secondary | ICD-10-CM | POA: Diagnosis present

## 2015-05-27 DIAGNOSIS — Z6829 Body mass index (BMI) 29.0-29.9, adult: Secondary | ICD-10-CM | POA: Diagnosis not present

## 2015-05-27 DIAGNOSIS — I071 Rheumatic tricuspid insufficiency: Secondary | ICD-10-CM | POA: Diagnosis present

## 2015-05-27 DIAGNOSIS — I4729 Other ventricular tachycardia: Secondary | ICD-10-CM

## 2015-05-27 DIAGNOSIS — K5909 Other constipation: Secondary | ICD-10-CM | POA: Diagnosis present

## 2015-05-27 DIAGNOSIS — M4850XA Collapsed vertebra, not elsewhere classified, site unspecified, initial encounter for fracture: Secondary | ICD-10-CM | POA: Diagnosis present

## 2015-05-27 DIAGNOSIS — M4850XD Collapsed vertebra, not elsewhere classified, site unspecified, subsequent encounter for fracture with routine healing: Secondary | ICD-10-CM

## 2015-05-27 DIAGNOSIS — Z789 Other specified health status: Secondary | ICD-10-CM | POA: Diagnosis not present

## 2015-05-27 DIAGNOSIS — Z9049 Acquired absence of other specified parts of digestive tract: Secondary | ICD-10-CM | POA: Diagnosis not present

## 2015-05-27 DIAGNOSIS — Z881 Allergy status to other antibiotic agents status: Secondary | ICD-10-CM | POA: Diagnosis not present

## 2015-05-27 DIAGNOSIS — Z820 Family history of epilepsy and other diseases of the nervous system: Secondary | ICD-10-CM | POA: Diagnosis not present

## 2015-05-27 DIAGNOSIS — Z720 Tobacco use: Secondary | ICD-10-CM

## 2015-05-27 DIAGNOSIS — Z7289 Other problems related to lifestyle: Secondary | ICD-10-CM | POA: Diagnosis present

## 2015-05-27 DIAGNOSIS — S0083XA Contusion of other part of head, initial encounter: Secondary | ICD-10-CM | POA: Diagnosis present

## 2015-05-27 DIAGNOSIS — F17200 Nicotine dependence, unspecified, uncomplicated: Secondary | ICD-10-CM | POA: Diagnosis present

## 2015-05-27 DIAGNOSIS — I471 Supraventricular tachycardia: Secondary | ICD-10-CM | POA: Diagnosis present

## 2015-05-27 DIAGNOSIS — R55 Syncope and collapse: Secondary | ICD-10-CM | POA: Diagnosis not present

## 2015-05-27 DIAGNOSIS — W19XXXA Unspecified fall, initial encounter: Secondary | ICD-10-CM | POA: Diagnosis present

## 2015-05-27 DIAGNOSIS — Z809 Family history of malignant neoplasm, unspecified: Secondary | ICD-10-CM | POA: Diagnosis not present

## 2015-05-27 DIAGNOSIS — Z880 Allergy status to penicillin: Secondary | ICD-10-CM | POA: Diagnosis not present

## 2015-05-27 DIAGNOSIS — I472 Ventricular tachycardia: Secondary | ICD-10-CM | POA: Diagnosis present

## 2015-05-27 DIAGNOSIS — I272 Other secondary pulmonary hypertension: Secondary | ICD-10-CM | POA: Diagnosis present

## 2015-05-27 DIAGNOSIS — K5900 Constipation, unspecified: Secondary | ICD-10-CM

## 2015-05-27 DIAGNOSIS — R296 Repeated falls: Secondary | ICD-10-CM

## 2015-05-27 DIAGNOSIS — R531 Weakness: Secondary | ICD-10-CM | POA: Diagnosis present

## 2015-05-27 DIAGNOSIS — E876 Hypokalemia: Secondary | ICD-10-CM | POA: Diagnosis present

## 2015-05-27 DIAGNOSIS — E440 Moderate protein-calorie malnutrition: Secondary | ICD-10-CM | POA: Diagnosis present

## 2015-05-27 DIAGNOSIS — T378X5A Adverse effect of other specified systemic anti-infectives and antiparasitics, initial encounter: Secondary | ICD-10-CM | POA: Diagnosis present

## 2015-05-27 DIAGNOSIS — S0083XD Contusion of other part of head, subsequent encounter: Secondary | ICD-10-CM

## 2015-05-27 DIAGNOSIS — Z9109 Other allergy status, other than to drugs and biological substances: Secondary | ICD-10-CM | POA: Diagnosis not present

## 2015-05-27 DIAGNOSIS — E871 Hypo-osmolality and hyponatremia: Secondary | ICD-10-CM | POA: Diagnosis present

## 2015-05-27 DIAGNOSIS — Z933 Colostomy status: Secondary | ICD-10-CM | POA: Diagnosis not present

## 2015-05-27 DIAGNOSIS — E860 Dehydration: Secondary | ICD-10-CM | POA: Diagnosis present

## 2015-05-27 DIAGNOSIS — R062 Wheezing: Secondary | ICD-10-CM | POA: Diagnosis present

## 2015-05-27 DIAGNOSIS — L270 Generalized skin eruption due to drugs and medicaments taken internally: Secondary | ICD-10-CM | POA: Diagnosis present

## 2015-05-27 HISTORY — DX: Pneumonia, unspecified organism: J18.9

## 2015-05-27 LAB — BASIC METABOLIC PANEL
Anion gap: 13 (ref 5–15)
BUN: 18 mg/dL (ref 6–20)
CALCIUM: 7.8 mg/dL — AB (ref 8.9–10.3)
CO2: 25 mmol/L (ref 22–32)
CREATININE: 0.66 mg/dL (ref 0.44–1.00)
Chloride: 90 mmol/L — ABNORMAL LOW (ref 101–111)
GFR calc Af Amer: >60 mL/min (ref >60)
GLUCOSE: 100 mg/dL — AB (ref 65–99)
Potassium: 3.1 mmol/L — ABNORMAL LOW (ref 3.5–5.1)
Sodium: 128 mmol/L — ABNORMAL LOW (ref 135–145)

## 2015-05-27 LAB — MRSA PCR SCREENING: MRSA BY PCR: NEGATIVE

## 2015-05-27 LAB — CBC WITH DIFFERENTIAL/PLATELET
BASOS PCT: 0 %
Basophils Absolute: 0 10*3/uL (ref 0.0–0.1)
EOS ABS: 0 10*3/uL (ref 0.0–0.7)
EOS PCT: 0 %
HCT: 32.5 % — ABNORMAL LOW (ref 36.0–46.0)
HEMOGLOBIN: 11.9 g/dL — AB (ref 12.0–15.0)
Lymphocytes Relative: 16 %
Lymphs Abs: 1.1 10*3/uL (ref 0.7–4.0)
MCH: 35.5 pg — AB (ref 26.0–34.0)
MCHC: 36.6 g/dL — AB (ref 30.0–36.0)
MCV: 97 fL (ref 78.0–100.0)
Monocytes Absolute: 0.4 10*3/uL (ref 0.1–1.0)
Monocytes Relative: 5 %
NEUTROS PCT: 79 %
Neutro Abs: 5.5 10*3/uL (ref 1.7–7.7)
PLATELETS: 300 10*3/uL (ref 150–400)
RBC: 3.35 MIL/uL — AB (ref 3.87–5.11)
RDW: 13.4 % (ref 11.5–15.5)
WBC: 7 10*3/uL (ref 4.0–10.5)

## 2015-05-27 LAB — COMPREHENSIVE METABOLIC PANEL
ALBUMIN: 3.4 g/dL — AB (ref 3.5–5.0)
ALT: 18 U/L (ref 14–54)
ANION GAP: 16 — AB (ref 5–15)
AST: 60 U/L — ABNORMAL HIGH (ref 15–41)
Alkaline Phosphatase: 121 U/L (ref 38–126)
BUN: 20 mg/dL (ref 6–20)
CO2: 26 mmol/L (ref 22–32)
Calcium: 8.4 mg/dL — ABNORMAL LOW (ref 8.9–10.3)
Chloride: 84 mmol/L — ABNORMAL LOW (ref 101–111)
Creatinine, Ser: 0.74 mg/dL (ref 0.44–1.00)
GFR calc Af Amer: >60 mL/min (ref >60)
GFR calc non Af Amer: >60 mL/min (ref >60)
GLUCOSE: 103 mg/dL — AB (ref 65–99)
POTASSIUM: 3.6 mmol/L (ref 3.5–5.1)
SODIUM: 126 mmol/L — AB (ref 135–145)
Total Bilirubin: 1.6 mg/dL — ABNORMAL HIGH (ref 0.3–1.2)
Total Protein: 6.9 g/dL (ref 6.5–8.1)

## 2015-05-27 LAB — URINALYSIS, ROUTINE W REFLEX MICROSCOPIC
GLUCOSE, UA: 100 mg/dL — AB
Hgb urine dipstick: NEGATIVE
KETONES UR: 15 mg/dL — AB
Leukocytes, UA: NEGATIVE
Nitrite: NEGATIVE
PH: 8 (ref 5.0–8.0)
PROTEIN: NEGATIVE mg/dL
Specific Gravity, Urine: 1.018 (ref 1.005–1.030)

## 2015-05-27 LAB — URIC ACID: Uric Acid, Serum: 3.4 mg/dL (ref 2.3–6.6)

## 2015-05-27 LAB — TSH: TSH: 1.442 u[IU]/mL (ref 0.350–4.500)

## 2015-05-27 LAB — PROTIME-INR
INR: 0.98 (ref 0.00–1.49)
PROTHROMBIN TIME: 13.2 s (ref 11.6–15.2)

## 2015-05-27 LAB — LIPASE, BLOOD: Lipase: 18 U/L (ref 11–51)

## 2015-05-27 LAB — SODIUM, URINE, RANDOM: Sodium, Ur: 100 mmol/L

## 2015-05-27 LAB — OSMOLALITY, URINE: Osmolality, Ur: 512 mOsm/kg (ref 300–900)

## 2015-05-27 LAB — TROPONIN I

## 2015-05-27 LAB — CREATININE, URINE, RANDOM: Creatinine, Urine: 89.07 mg/dL

## 2015-05-27 LAB — CK: CK TOTAL: 47 U/L (ref 38–234)

## 2015-05-27 LAB — BRAIN NATRIURETIC PEPTIDE: B NATRIURETIC PEPTIDE 5: 206.4 pg/mL — AB (ref 0.0–100.0)

## 2015-05-27 LAB — MAGNESIUM: Magnesium: 1 mg/dL — ABNORMAL LOW (ref 1.7–2.4)

## 2015-05-27 MED ORDER — SENNOSIDES-DOCUSATE SODIUM 8.6-50 MG PO TABS
1.0000 | ORAL_TABLET | Freq: Two times a day (BID) | ORAL | Status: DC
  Administered 2015-05-27 – 2015-05-28 (×2): 1 via ORAL
  Filled 2015-05-27 (×2): qty 1

## 2015-05-27 MED ORDER — SODIUM CHLORIDE 0.9% FLUSH
3.0000 mL | Freq: Two times a day (BID) | INTRAVENOUS | Status: DC
  Administered 2015-05-29: 3 mL via INTRAVENOUS

## 2015-05-27 MED ORDER — ONDANSETRON HCL 4 MG PO TABS
4.0000 mg | ORAL_TABLET | Freq: Four times a day (QID) | ORAL | Status: DC | PRN
  Administered 2015-05-30: 4 mg via ORAL
  Filled 2015-05-27: qty 1

## 2015-05-27 MED ORDER — LEVOFLOXACIN IN D5W 750 MG/150ML IV SOLN
750.0000 mg | Freq: Once | INTRAVENOUS | Status: AC
  Administered 2015-05-27: 750 mg via INTRAVENOUS
  Filled 2015-05-27: qty 150

## 2015-05-27 MED ORDER — SODIUM CHLORIDE 0.9 % IV BOLUS (SEPSIS)
500.0000 mL | Freq: Once | INTRAVENOUS | Status: AC
  Administered 2015-05-27: 500 mL via INTRAVENOUS

## 2015-05-27 MED ORDER — BISACODYL 10 MG RE SUPP: 10.0000 mg | Freq: Once | RECTAL | Status: DC

## 2015-05-27 MED ORDER — ENSURE ENLIVE PO LIQD
237.0000 mL | Freq: Two times a day (BID) | ORAL | Status: DC
  Administered 2015-05-28 – 2015-05-29 (×3): 237 mL via ORAL

## 2015-05-27 MED ORDER — LORAZEPAM 1 MG PO TABS: 1.0000 mg | ORAL_TABLET | Freq: Four times a day (QID) | ORAL | Status: DC | PRN

## 2015-05-27 MED ORDER — ONDANSETRON HCL 4 MG/2ML IJ SOLN: 4.0000 mg | Freq: Four times a day (QID) | INTRAMUSCULAR | Status: DC | PRN

## 2015-05-27 MED ORDER — ENOXAPARIN SODIUM 40 MG/0.4ML ~~LOC~~ SOLN
40.0000 mg | SUBCUTANEOUS | Status: DC
  Filled 2015-05-27 (×3): qty 0.4

## 2015-05-27 MED ORDER — POTASSIUM CHLORIDE CRYS ER 20 MEQ PO TBCR
40.0000 meq | EXTENDED_RELEASE_TABLET | Freq: Once | ORAL | Status: AC
  Administered 2015-05-27: 40 meq via ORAL
  Filled 2015-05-27: qty 2

## 2015-05-27 MED ORDER — FOLIC ACID 1 MG PO TABS
1.0000 mg | ORAL_TABLET | Freq: Every day | ORAL | Status: DC
  Administered 2015-05-27 – 2015-05-30 (×4): 1 mg via ORAL
  Filled 2015-05-27 (×4): qty 1

## 2015-05-27 MED ORDER — BISACODYL 10 MG RE SUPP: 10.0000 mg | Freq: Every day | RECTAL | Status: DC | PRN

## 2015-05-27 MED ORDER — LORAZEPAM 2 MG/ML IJ SOLN: 1.0000 mg | Freq: Four times a day (QID) | INTRAMUSCULAR | Status: DC | PRN

## 2015-05-27 MED ORDER — MAGNESIUM SULFATE 2 GM/50ML IV SOLN
2.0000 g | Freq: Once | INTRAVENOUS | Status: AC
  Administered 2015-05-27: 2 g via INTRAVENOUS
  Filled 2015-05-27 (×2): qty 50

## 2015-05-27 MED ORDER — VITAMIN B-1 100 MG PO TABS
100.0000 mg | ORAL_TABLET | Freq: Every day | ORAL | Status: DC
  Administered 2015-05-27 – 2015-05-30 (×4): 100 mg via ORAL
  Filled 2015-05-27 (×4): qty 1

## 2015-05-27 MED ORDER — LEVOFLOXACIN 500 MG PO TABS
500.0000 mg | ORAL_TABLET | Freq: Every day | ORAL | Status: AC
  Administered 2015-05-28: 500 mg via ORAL
  Filled 2015-05-27: qty 1

## 2015-05-27 MED ORDER — ACETAMINOPHEN 325 MG PO TABS
650.0000 mg | ORAL_TABLET | Freq: Four times a day (QID) | ORAL | Status: DC | PRN
  Administered 2015-05-27: 650 mg via ORAL
  Filled 2015-05-27: qty 2

## 2015-05-27 MED ORDER — ACETAMINOPHEN 650 MG RE SUPP: 650.0000 mg | Freq: Four times a day (QID) | RECTAL | Status: DC | PRN

## 2015-05-27 MED ORDER — ADULT MULTIVITAMIN W/MINERALS CH
1.0000 | ORAL_TABLET | Freq: Every day | ORAL | Status: DC
  Administered 2015-05-27 – 2015-05-30 (×4): 1 via ORAL
  Filled 2015-05-27 (×4): qty 1

## 2015-05-27 MED ORDER — POLYETHYLENE GLYCOL 3350 17 G PO PACK
17.0000 g | PACK | Freq: Two times a day (BID) | ORAL | Status: DC
Start: 2015-05-27 — End: 2015-05-30
  Administered 2015-05-27 – 2015-05-30 (×6): 17 g via ORAL
  Filled 2015-05-27 (×7): qty 1

## 2015-05-27 MED ORDER — THIAMINE HCL 100 MG/ML IJ SOLN
Freq: Once | INTRAVENOUS | Status: AC
  Administered 2015-05-27: 22:00:00 via INTRAVENOUS
  Filled 2015-05-27 (×2): qty 1000

## 2015-05-27 MED ORDER — THIAMINE HCL 100 MG/ML IJ SOLN: 100.0000 mg | Freq: Every day | INTRAMUSCULAR | Status: DC

## 2015-05-27 MED ORDER — SODIUM CHLORIDE 0.9 % IV SOLN
INTRAVENOUS | Status: DC
  Administered 2015-05-27: 15:00:00 via INTRAVENOUS

## 2015-05-27 NOTE — ED Notes (Signed)
Bed: ZO10 Expected date:  Expected time:  Means of arrival:  Comments: EMS-abd. issues

## 2015-05-27 NOTE — ED Notes (Signed)
Pt arrived via EMS with report of generalized weakness, poor appetite, n/v x3 in past 24hrs and no BM x2 weeks. Pt has been recently treated for PNA and falling.

## 2015-05-27 NOTE — ED Notes (Signed)
Delay in lab draw, MD at the bedside

## 2015-05-27 NOTE — ED Notes (Signed)
Pt taken to x-ray and returned to room without distress noted. 

## 2015-05-27 NOTE — ED Notes (Signed)
Admitting MD at bedside.

## 2015-05-27 NOTE — H&P (Signed)
[facility] History and Physical   Patient: Rachel Williams ZOX:[id number]   PCP: No primary care provider on file. DOB: [birth year]   DOA: 05/27/2015   DOS: 05/27/2015   DOS: the patient was seen and examined on 05/27/2015  Referring physician: Dr. Clarice Pole Chief Complaint: Recurrent fall  HPI: SHENEA GIACOBBE is a 65 y.o. female with Past medical history of colon resection for perforation. Patient presented with complains of recurrent fall. Patient mentions that she has been having increasing frequency of recurrent fall over last few weeks. Her fall are described as she will be losing balance. She denies any dizziness or lightheadedness or passing out episode she denies any palpitation she denies any localized weakness or weakness of bilateral lower extremities. She denies any loss of control of bowel or bladder as well. She denies any focal deficit noting numbness at the time of my evaluation. She has sustained a facial bruise as well as a wrist fracture and was recently seen in the ER. She has a history of chronic constipation and was admitted in the past for the same and has been having no bowel movement for last 2 weeks. She also complains of occasional on and off cough but when she has a call she has nausea and she has borderline intake and has not had anything to eat for last 3 days. She also was seen by PCP recently and was started on Levaquin for suspected pneumonia. No other recent change in meds reported.  The patient is coming from home.  At her baseline ambulates without a walker And is independent for most of her ADL; manages her medication on her own.  Review of Systems: as mentioned in the history of present illness.  A comprehensive review of the other systems is negative.  Past Medical History  Diagnosis Date  . Pneumonia    Past Surgical History  Procedure Laterality Date  . Colon resection      perforation  . Colostomy     Social History:  reports that she has  been smoking Cigarettes.  She has a 50 pack-year smoking history. She does not have any smokeless tobacco history on file. She reports that she drinks about 8.4 oz of alcohol per week. She reports that she does not use illicit drugs.  Allergies  Allergen Reactions  . Penicillins     Has patient had a PCN reaction causing immediate rash, facial/tongue/throat swelling, SOB or lightheadedness with hypotension: Yes. Has patient had a PCN reaction causing severe rash involving mucus membranes or skin necrosis: No answer Has patient had a PCN reaction that required hospitalization No Has patient had a PCN reaction occurring within the last 10 years: No If all of the above answers are "NO", then may proceed with Cephalosporin use.    . Ciprofloxacin Rash  . Nickel Rash    Anything with nickel in it.     Family History  Problem Relation Age of Onset  . Alzheimer's disease Mother   . Cancer Sister     Prior to Admission medications   Medication Sig Start Date End Date Taking? Authorizing Provider  diphenhydrAMINE (BENADRYL) 25 MG tablet Take 25 mg by mouth every 6 (six) hours as needed for allergies.   Yes Historical Provider, MD  levofloxacin (LEVAQUIN) 500 MG tablet Take 500 mg by mouth daily. 05/23/15  Yes Historical Provider, MD    Physical Exam: Filed Vitals:   05/27/15 1613 05/27/15 1630 05/27/15 1730 05/27/15 1800  BP: 133/97 137/111 109/73  114/79  Pulse: 100 102    Temp:      TempSrc:      Resp: 17 18 16 15   SpO2: 99% 98%      General: Alert, Awake and Oriented to Time, Place and Person. Appear in moderate distress Eyes: PERRL ENT: Oral Mucosa clear dry. Neck: no JVD Cardiovascular: S1 and S2 Present, no Murmur, Peripheral Pulses Present Respiratory: Bilateral Air entry equal and Decreased,  Clear to Auscultation, no Crackles, no wheezes Abdomen: Bowel Sound present, Soft and no tenderness Skin: no Rash Extremities: no Pedal edema, no calf tenderness Neurologic: Mental  status AAOx3, speech normal, attention normal,  Cranial Nerves PERRL, EOM normal and present, facial sensation to light touch present,  Motor strength bilateral equal strength 5/5,  Sensation present to light touch,  Reflexes present knee and biceps, babinski negative,  Cerebellar test normal finger nose finger.  Labs on Admission:  CBC:  Recent Labs Lab 05/27/15 1300  WBC 7.0  NEUTROABS 5.5  HGB 11.9*  HCT 32.5*  MCV 97.0  PLT 300    CMP     Component Value Date/Time   NA 128* 05/27/2015 1701   K 3.1* 05/27/2015 1701   CL 90* 05/27/2015 1701   CO2 25 05/27/2015 1701   GLUCOSE 100* 05/27/2015 1701   BUN 18 05/27/2015 1701   CREATININE 0.66 05/27/2015 1701   CALCIUM 7.8* 05/27/2015 1701   PROT 6.9 05/27/2015 1300   ALBUMIN 3.4* 05/27/2015 1300   AST 60* 05/27/2015 1300   ALT 18 05/27/2015 1300   ALKPHOS 121 05/27/2015 1300   BILITOT 1.6* 05/27/2015 1300   GFRNONAA >60 05/27/2015 1701   GFRAA >60 05/27/2015 1701     Recent Labs Lab 05/27/15 1300 05/27/15 1701  CKTOTAL  --  47  TROPONINI <0.03  --    BNP (last 3 results)  Recent Labs  05/27/15 1300  BNP 206.4*    ProBNP (last 3 results) No results for input(s): PROBNP in the last 8760 hours.   Radiological Exams on Admission: Dg Chest 2 View  05/27/2015  CLINICAL DATA:  Pt arrived via EMS with report of generalized weakness, poor appetite, n/v x3 in past 24hrs and no BM x2 weeks. Pt has been recently treated for PNA and falling. Smoker. EXAM: CHEST  2 VIEW COMPARISON:  12/19/2014 FINDINGS: The cardiac silhouette is normal in size and configuration. No mediastinal or hilar masses or evidence of adenopathy. Lungs are clear.  No pleural effusion or pneumothorax. Mild wedge-shaped compression deformity of a mid thoracic vertebra appears new when compared to a lateral chest radiograph dated 04/29/2011, but is of unclear chronicity. Most recent prior study was an AP exam only. Bony thorax is demineralized but  otherwise intact. IMPRESSION: 1. No acute cardiopulmonary disease. 2. Mild compression fracture of a mid thoracic vertebrae of unclear chronicity, but new since January 2013. Electronically Signed   By: Amie Portland M.D.   On: 05/27/2015 14:36   Ct Head Wo Contrast  05/27/2015  CLINICAL DATA:  Pt arrived via EMS with report of generalized weakness, poor appetite, n/v x3 in past 24hrs and no BM x2 weeks. Pt has been recently treated for PNA and falling. EXAM: CT HEAD WITHOUT CONTRAST CT MAXILLOFACIAL WITHOUT CONTRAST TECHNIQUE: Multidetector CT imaging of the head and maxillofacial structures were performed using the standard protocol without intravenous contrast. Multiplanar CT image reconstructions of the maxillofacial structures were also generated. COMPARISON:  None. FINDINGS: CT HEAD FINDINGS The ventricles are normal in size  and configuration. There is mild sulcal enlargement reflecting mild generalized atrophy. There are no parenchymal masses or mass effect. There is no evidence of a cortical infarct. Mild periventricular white matter hypoattenuation is noted consistent with chronic microvascular ischemic change. There are no extra-axial masses or abnormal fluid collections. There is no intracranial hemorrhage. The single posterior right ethmoid air cell is opacified with mucosal thickening. Remaining visualized sinuses and mastoid air cells are clear. No skull fracture. CT MAXILLOFACIAL FINDINGS No fractures. Small right cheek hematoma. No other soft tissue abnormality. Single posterior right ethmoid air cell is mostly opacified with mucosal thickening. Small mucous retention cyst lies at the base of the left maxillary sinus. Sinuses otherwise clear. Clear mastoid air cells and middle ear cavities. Normal globes and orbits. IMPRESSION: HEAD CT:  No acute intracranial abnormalities.  No skull fracture. MAXILLOFACIAL CT: No fractures. Small right cheek soft tissue hematoma. Electronically Signed   By: Amie Portland M.D.   On: 05/27/2015 14:53   Dg Abd 2 Views  05/27/2015  CLINICAL DATA:  N/V after fall today; hx perforated bowel years ago per pt EXAM: ABDOMEN - 2 VIEW COMPARISON:  12/19/2014 FINDINGS: Stable foreign body in the pelvis as previously described most consistent with intrauterine device. No abnormally dilated loops of bowel. No evidence of free air. IMPRESSION: No acute findings Electronically Signed   By: Esperanza Heir M.D.   On: 05/27/2015 17:58   Ct Maxillofacial Wo Cm  05/27/2015  CLINICAL DATA:  Pt arrived via EMS with report of generalized weakness, poor appetite, n/v x3 in past 24hrs and no BM x2 weeks. Pt has been recently treated for PNA and falling. EXAM: CT HEAD WITHOUT CONTRAST CT MAXILLOFACIAL WITHOUT CONTRAST TECHNIQUE: Multidetector CT imaging of the head and maxillofacial structures were performed using the standard protocol without intravenous contrast. Multiplanar CT image reconstructions of the maxillofacial structures were also generated. COMPARISON:  None. FINDINGS: CT HEAD FINDINGS The ventricles are normal in size and configuration. There is mild sulcal enlargement reflecting mild generalized atrophy. There are no parenchymal masses or mass effect. There is no evidence of a cortical infarct. Mild periventricular white matter hypoattenuation is noted consistent with chronic microvascular ischemic change. There are no extra-axial masses or abnormal fluid collections. There is no intracranial hemorrhage. The single posterior right ethmoid air cell is opacified with mucosal thickening. Remaining visualized sinuses and mastoid air cells are clear. No skull fracture. CT MAXILLOFACIAL FINDINGS No fractures. Small right cheek hematoma. No other soft tissue abnormality. Single posterior right ethmoid air cell is mostly opacified with mucosal thickening. Small mucous retention cyst lies at the base of the left maxillary sinus. Sinuses otherwise clear. Clear mastoid air cells and middle  ear cavities. Normal globes and orbits. IMPRESSION: HEAD CT:  No acute intracranial abnormalities.  No skull fracture. MAXILLOFACIAL CT: No fractures. Small right cheek soft tissue hematoma. Electronically Signed   By: Amie Portland M.D.   On: 05/27/2015 14:53   Assessment/Plan 1. Hyponatremia Patient presents with recurrent fall. Workup shows that she has significant hyponatremia. Etiology of the hyponatremia is unclear but probably dehydration. We will get further workup. Patient is currently on IV hydration. We'll monitor closely with every 4 hours BMP. Monitor on telemetry at present  2. Recurrent fall. Likely multifactorial. Patient has chronic alcohol use, chronic hyponatremia. CT of the head is unremarkable no focal deficit on examination. We will get further workup. PTOT consultation.  3. Chronic alcohol use. Monitor on telemetry and monitored with CIWA  protocol.  4. Active smoker. Nicotine patch. Counseled against smoking.  5. Hypomagnesemia hypokalemia. Replacing. Monitor and recheck in the morning.  6. Dehydration with chronic constipation. X-ray abdomen does not show any obstruction. Will use MiraLAX and Senokot. Dulcolax suppository 1. May need an enema.  7. Acute pharyngitis. Next and PCP has started the patient on Levaquin for 10 days. Given that is no evidence of pneumonia on the chest x-ray I would reduce the duration to 5 days.   Nutrition: Regular diet DVT Prophylaxis: subcutaneous Heparin  Advance goals of care discussion: full code   Consults: none  Family Communication: family was present at bedside, opportunity was given to ask question and all questions were answered satisfactorily at the time of interview. Disposition: Admitted as inpatient, telemetry unit.  Author: Lynden Oxford, MD [facility] Pager: [phone] 05/27/2015  If 7PM-7AM, please contact night-coverage [email or web address] Password [email or web address]

## 2015-05-27 NOTE — Progress Notes (Signed)
Patient arrived from ED in stable condition. Placed on telemetry and contact precautions for MRSA placed. Earnest Conroy. Clelia Croft, RN

## 2015-05-27 NOTE — ED Provider Notes (Signed)
CSN: [id number]     Arrival date & time 05/27/15  1221 History   First MD Initiated Contact with Patient 05/27/15 1247     Chief Complaint  Patient presents with  . Weakness     (Consider location/radiation/quality/duration/timing/severity/associated sxs/prior Treatment) HPI Patient has had approximately 2 weeks of increasing weakness and difficulty taking in oral food. She has become very weak and has fallen twice in this past week. She was seen in the emergency department 3 days ago for fall with her wrist and face injury. Since that time she has remained weak and unable to eat anything. She was diagnosed with pneumonia at the beginning of the week and is being treated with oral Levaquin. She reports she does continue to have some cough. She has not had documented fever. Past Medical History  Diagnosis Date  . Pneumonia    Past Surgical History  Procedure Laterality Date  . Colon resection    . Colostomy     Family History  Problem Relation Age of Onset  . Alzheimer's disease Mother   . Cancer Sister    Social History  Substance Use Topics  . Smoking status: Current Every Day Smoker  . Smokeless tobacco: None  . Alcohol Use: Yes     Comment: daily   OB History    No data available     Review of Systems 10 Systems reviewed and are negative for acute change except as noted in the HPI.    Allergies  Penicillins; Ciprofloxacin; and Nickel  Home Medications   Prior to Admission medications   Medication Sig Start Date End Date Taking? Authorizing Provider  diphenhydrAMINE (BENADRYL) 25 MG tablet Take 25 mg by mouth every 6 (six) hours as needed for allergies.   Yes Historical Provider, MD  levofloxacin (LEVAQUIN) 500 MG tablet Take 500 mg by mouth daily. 05/23/15  Yes Historical Provider, MD   BP 130/92 mmHg  Pulse 99  Temp(Src) 98.4 F (36.9 C) (Oral)  Resp 18  SpO2 99% Physical Exam  Constitutional: She is oriented to person, place, and time.  Patient is alert  and nontoxic. She does not have acute respiratory distress.  HENT:  Nose: Nose normal.  Mouth/Throat: Oropharynx is clear and moist.  Large left-sided facial swelling from the forehead to the angle of the mandible. diffuse ecchymosis.  Eyes: EOM are normal. Pupils are equal, round, and reactive to light. No scleral icterus.  Neck: Neck supple.  Cardiovascular: Normal heart sounds and intact distal pulses.   Heart is tachycardic with occasional ectopic beat. On monitor this corresponds to sinus rhythm with occasional PACs.  Pulmonary/Chest: Effort normal. She exhibits no tenderness.  Breath sounds are decreased at the bases. No gross wheeze rhonchi rail  Abdominal: Soft. Bowel sounds are normal. She exhibits no distension. There is no tenderness. There is no guarding.  Musculoskeletal:  Patient is wearing a splint on the right upper extremity which has bruising present. Bruising on the right shoulder. Normal range of motion of bilateral lower extremities. No calf tenderness. No peripheral edema.  Neurological: She is alert and oriented to person, place, and time. No cranial nerve deficit. She exhibits normal muscle tone.  Patient is generally weak but does not have a focal neurologic deficit. She was not ambulated.  Skin: Skin is warm and dry. There is pallor.  Psychiatric: She has a normal mood and affect.    ED Course  Procedures (including critical care time) Labs Review Labs Reviewed  COMPREHENSIVE METABOLIC PANEL -  Abnormal; Notable for the following:    Sodium 126 (*)    Chloride 84 (*)    Glucose, Bld 103 (*)    Calcium 8.4 (*)    Albumin 3.4 (*)    AST 60 (*)    Total Bilirubin 1.6 (*)    Anion gap 16 (*)    All other components within normal limits  BRAIN NATRIURETIC PEPTIDE - Abnormal; Notable for the following:    B Natriuretic Peptide 206.4 (*)    All other components within normal limits  CBC WITH DIFFERENTIAL/PLATELET - Abnormal; Notable for the following:    RBC  3.35 (*)    Hemoglobin 11.9 (*)    HCT 32.5 (*)    MCH 35.5 (*)    MCHC 36.6 (*)    All other components within normal limits  CULTURE, BLOOD (ROUTINE X 2)  CULTURE, BLOOD (ROUTINE X 2)  LIPASE, BLOOD  TROPONIN I  PROTIME-INR  URINALYSIS, ROUTINE W REFLEX MICROSCOPIC (NOT AT [facility])  BASIC METABOLIC PANEL  OSMOLALITY, URINE  OSMOLALITY  SODIUM, URINE, RANDOM  CREATININE, URINE, RANDOM  URIC ACID  TSH  CK  MAGNESIUM  I-STAT CG4 LACTIC ACID, ED    Imaging Review Dg Chest 2 View  05/27/2015  CLINICAL DATA:  Pt arrived via EMS with report of generalized weakness, poor appetite, n/v x3 in past 24hrs and no BM x2 weeks. Pt has been recently treated for PNA and falling. Smoker. EXAM: CHEST  2 VIEW COMPARISON:  12/19/2014 FINDINGS: The cardiac silhouette is normal in size and configuration. No mediastinal or hilar masses or evidence of adenopathy. Lungs are clear.  No pleural effusion or pneumothorax. Mild wedge-shaped compression deformity of a mid thoracic vertebra appears new when compared to a lateral chest radiograph dated 04/29/2011, but is of unclear chronicity. Most recent prior study was an AP exam only. Bony thorax is demineralized but otherwise intact. IMPRESSION: 1. No acute cardiopulmonary disease. 2. Mild compression fracture of a mid thoracic vertebrae of unclear chronicity, but new since January 2013. Electronically Signed   By: Amie Portland M.D.   On: 05/27/2015 14:36   Ct Head Wo Contrast  05/27/2015  CLINICAL DATA:  Pt arrived via EMS with report of generalized weakness, poor appetite, n/v x3 in past 24hrs and no BM x2 weeks. Pt has been recently treated for PNA and falling. EXAM: CT HEAD WITHOUT CONTRAST CT MAXILLOFACIAL WITHOUT CONTRAST TECHNIQUE: Multidetector CT imaging of the head and maxillofacial structures were performed using the standard protocol without intravenous contrast. Multiplanar CT image reconstructions of the maxillofacial structures were also generated.  COMPARISON:  None. FINDINGS: CT HEAD FINDINGS The ventricles are normal in size and configuration. There is mild sulcal enlargement reflecting mild generalized atrophy. There are no parenchymal masses or mass effect. There is no evidence of a cortical infarct. Mild periventricular white matter hypoattenuation is noted consistent with chronic microvascular ischemic change. There are no extra-axial masses or abnormal fluid collections. There is no intracranial hemorrhage. The single posterior right ethmoid air cell is opacified with mucosal thickening. Remaining visualized sinuses and mastoid air cells are clear. No skull fracture. CT MAXILLOFACIAL FINDINGS No fractures. Small right cheek hematoma. No other soft tissue abnormality. Single posterior right ethmoid air cell is mostly opacified with mucosal thickening. Small mucous retention cyst lies at the base of the left maxillary sinus. Sinuses otherwise clear. Clear mastoid air cells and middle ear cavities. Normal globes and orbits. IMPRESSION: HEAD CT:  No acute intracranial abnormalities.  No  skull fracture. MAXILLOFACIAL CT: No fractures. Small right cheek soft tissue hematoma. Electronically Signed   By: Amie Portland M.D.   On: 05/27/2015 14:53   Ct Maxillofacial Wo Cm  05/27/2015  CLINICAL DATA:  Pt arrived via EMS with report of generalized weakness, poor appetite, n/v x3 in past 24hrs and no BM x2 weeks. Pt has been recently treated for PNA and falling. EXAM: CT HEAD WITHOUT CONTRAST CT MAXILLOFACIAL WITHOUT CONTRAST TECHNIQUE: Multidetector CT imaging of the head and maxillofacial structures were performed using the standard protocol without intravenous contrast. Multiplanar CT image reconstructions of the maxillofacial structures were also generated. COMPARISON:  None. FINDINGS: CT HEAD FINDINGS The ventricles are normal in size and configuration. There is mild sulcal enlargement reflecting mild generalized atrophy. There are no parenchymal masses or  mass effect. There is no evidence of a cortical infarct. Mild periventricular white matter hypoattenuation is noted consistent with chronic microvascular ischemic change. There are no extra-axial masses or abnormal fluid collections. There is no intracranial hemorrhage. The single posterior right ethmoid air cell is opacified with mucosal thickening. Remaining visualized sinuses and mastoid air cells are clear. No skull fracture. CT MAXILLOFACIAL FINDINGS No fractures. Small right cheek hematoma. No other soft tissue abnormality. Single posterior right ethmoid air cell is mostly opacified with mucosal thickening. Small mucous retention cyst lies at the base of the left maxillary sinus. Sinuses otherwise clear. Clear mastoid air cells and middle ear cavities. Normal globes and orbits. IMPRESSION: HEAD CT:  No acute intracranial abnormalities.  No skull fracture. MAXILLOFACIAL CT: No fractures. Small right cheek soft tissue hematoma. Electronically Signed   By: Amie Portland M.D.   On: 05/27/2015 14:53   I have personally reviewed and evaluated these images and lab results as part of my medical decision-making.   EKG Interpretation None      MDM   Final diagnoses:  Recurrent falls  Hyponatremia  Generalized weakness  Facial contusion, subsequent encounter    Patient has recurrent falls. At this time she is also experiencing generalized weakness and poor by mouth intake. Falls have resulted in physical injury. Patient will be admitted for further diagnostic evaluation and treatment of dehydration and hyponatremia.   Arby Barrette, MD 05/27/15 [phone]

## 2015-05-27 NOTE — ED Notes (Signed)
Pt taken to xray 

## 2015-05-27 NOTE — ED Notes (Signed)
Pt taken to CT scan and returned to room without distress noted. 

## 2015-05-27 NOTE — ED Notes (Signed)
Unable to collect labs at this time patient is not in the room 

## 2015-05-28 LAB — COMPREHENSIVE METABOLIC PANEL
ALT: 14 U/L (ref 14–54)
AST: 53 U/L — AB (ref 15–41)
Albumin: 2.8 g/dL — ABNORMAL LOW (ref 3.5–5.0)
Alkaline Phosphatase: 106 U/L (ref 38–126)
Anion gap: 10 (ref 5–15)
BILIRUBIN TOTAL: 1.1 mg/dL (ref 0.3–1.2)
BUN: 18 mg/dL (ref 6–20)
CO2: 26 mmol/L (ref 22–32)
CREATININE: 0.59 mg/dL (ref 0.44–1.00)
Calcium: 7.7 mg/dL — ABNORMAL LOW (ref 8.9–10.3)
Chloride: 96 mmol/L — ABNORMAL LOW (ref 101–111)
GFR calc Af Amer: >60 mL/min (ref >60)
Glucose, Bld: 107 mg/dL — ABNORMAL HIGH (ref 65–99)
POTASSIUM: 3.2 mmol/L — AB (ref 3.5–5.1)
Sodium: 132 mmol/L — ABNORMAL LOW (ref 135–145)
TOTAL PROTEIN: 5.7 g/dL — AB (ref 6.5–8.1)

## 2015-05-28 LAB — OSMOLALITY: Osmolality: 265 mOsm/kg — ABNORMAL LOW (ref 275–295)

## 2015-05-28 LAB — CBC
HEMATOCRIT: 26.5 % — AB (ref 36.0–46.0)
Hemoglobin: 9.6 g/dL — ABNORMAL LOW (ref 12.0–15.0)
MCH: 34.8 pg — ABNORMAL HIGH (ref 26.0–34.0)
MCHC: 36.2 g/dL — ABNORMAL HIGH (ref 30.0–36.0)
MCV: 96 fL (ref 78.0–100.0)
Platelets: 233 10*3/uL (ref 150–400)
RBC: 2.76 MIL/uL — ABNORMAL LOW (ref 3.87–5.11)
RDW: 13.4 % (ref 11.5–15.5)
WBC: 7.5 10*3/uL (ref 4.0–10.5)

## 2015-05-28 MED ORDER — SODIUM CHLORIDE 0.9 % IV SOLN
INTRAVENOUS | Status: DC
  Administered 2015-05-28 – 2015-05-29 (×3): via INTRAVENOUS

## 2015-05-28 MED ORDER — NICOTINE 21 MG/24HR TD PT24
21.0000 mg | MEDICATED_PATCH | Freq: Every day | TRANSDERMAL | Status: DC
  Administered 2015-05-28 – 2015-05-29 (×2): 21 mg via TRANSDERMAL
  Filled 2015-05-28 (×3): qty 1

## 2015-05-28 MED ORDER — TAMSULOSIN HCL 0.4 MG PO CAPS
0.4000 mg | ORAL_CAPSULE | Freq: Every day | ORAL | Status: DC
  Administered 2015-05-28 – 2015-05-30 (×3): 0.4 mg via ORAL
  Filled 2015-05-28 (×3): qty 1

## 2015-05-28 MED ORDER — SODIUM CHLORIDE 0.9 % IV BOLUS (SEPSIS)
1000.0000 mL | Freq: Once | INTRAVENOUS | Status: AC
  Administered 2015-05-28: 1000 mL via INTRAVENOUS

## 2015-05-28 MED ORDER — SENNOSIDES-DOCUSATE SODIUM 8.6-50 MG PO TABS
2.0000 | ORAL_TABLET | Freq: Two times a day (BID) | ORAL | Status: DC
  Administered 2015-05-29 – 2015-05-30 (×4): 2 via ORAL
  Filled 2015-05-28 (×4): qty 2

## 2015-05-28 MED ORDER — POTASSIUM CHLORIDE CRYS ER 20 MEQ PO TBCR
40.0000 meq | EXTENDED_RELEASE_TABLET | Freq: Every day | ORAL | Status: DC
  Administered 2015-05-28 – 2015-05-30 (×3): 40 meq via ORAL
  Filled 2015-05-28 (×3): qty 2

## 2015-05-28 NOTE — Progress Notes (Signed)
Patient had not urinated in 9 hrs. She had been up multiple times to the [place] and was unable to urinate. Bladder was scanned and it showed 370cc. On call was notified and new orders were given for an in and out cath. Cath was done and output was 500cc.

## 2015-05-28 NOTE — Progress Notes (Signed)
Patient given soap suds enema. Pt tolerated 250cc. Pt had small liquid BM after enema.  Earnest Conroy. Clelia Croft, RN

## 2015-05-28 NOTE — Progress Notes (Signed)
Patient's orthostatic vitals were taken this morning and in the computer.

## 2015-05-28 NOTE — Progress Notes (Signed)
[facility] Progress Note  Patient: Rachel Williams JXB:[id number]   PCP: No primary care provider on file. DOB: Jan 11, 1951   DOA: 05/27/2015   DOS: 05/28/2015   Date of Service: the patient was seen and examined on 05/28/2015  Subjective: Patient this morning wanted to leave the hospital to smoke. She was explained the consequences of leaving AGAINST MEDICAL ADVICE without any adequate workup. Patient is admitted to stay in the hospital. Does not have any acute complaint of dizziness or lightheadedness. Nutrition: Tolerating oral diet Activity: Ambulating in the room with assistance Last BM: 05/13/2015  Assessment and Plan: 1. Hyponatremia Likely secondary to dehydration and poor oral intake. Currently resolved. We will continue to closely monitor.  2. Recurrent fall. Likely multifactorial. Patient has chronic alcohol use, chronic hyponatremia. CT of the head is unremarkable no focal deficit on examination. TSH and CK are unremarkable. Patient has mild unsteadiness at present. Continue thiamine PTOT consultation.  3. Chronic alcohol use. Monitor on telemetry and monitored with CIWA protocol. Continue thiamine  4. Active smoker. Nicotine patch. Counseled against smoking.  5. Hypomagnesemia hypokalemia. Replacing. Monitor and recheck in the morning.  6. Dehydration with chronic constipation. X-ray abdomen does not show any obstruction. Will use MiraLAX and Senokot. Dulcolax suppository 1. Soapsuds enema 1, continue aggressive hydration and will increase the bowel regimen  7. Acute pharyngitis. PCP has started the patient on Levaquin for 10 days. Given that is no evidence of pneumonia on the chest x-ray I would reduce the duration to 5 days.  Nutrition: Regular diet DVT Prophylaxis: subcutaneous Heparin  Advance goals of care discussion: Full code  Brief Summary of Hospitalization:  Daily update, Procedures: None Consultants: None Antibiotics: Anti-infectives    Start     Dose/Rate Route Frequency Ordered Stop   05/28/15 1000  levofloxacin (LEVAQUIN) tablet 500 mg     500 mg Oral Daily 05/27/15 1652 05/28/15 0918   05/27/15 1415  levofloxacin (LEVAQUIN) IVPB 750 mg     750 mg 100 mL/hr over 90 Minutes Intravenous  Once 05/27/15 1401 05/27/15 1635       Family Communication: family was present at bedside, at the time of interview.  Opportunity was given to ask question and all questions were answered satisfactorily.   Disposition:  Expected discharge date: 05/29/2015 Barriers to safe discharge: Constipation   Intake/Output Summary (Last 24 hours) at 05/28/15 1644 Last data filed at 05/28/15 1300  Gross per 24 hour  Intake   1280 ml  Output    900 ml  Net    380 ml   Filed Weights   05/27/15 1854 05/28/15 0557  Weight: 69.4 kg (153 lb) 71.4 kg (157 lb 6.5 oz)    Objective: Physical Exam: Filed Vitals:   05/27/15 2143 05/28/15 0106 05/28/15 0557 05/28/15 1330  BP: 94/70 114/82 108/72 104/76  Pulse:   96 102  Temp:   98.5 F (36.9 C) 98.4 F (36.9 C)  TempSrc:   Oral Oral  Resp:   16 16  Height:      Weight:   71.4 kg (157 lb 6.5 oz)   SpO2:   95% 96%     General: Appear in mild distress, NO Rash; Oral Mucosa moist. Cardiovascular: S1 and S2 Present, no Murmur, no JVD Respiratory: Bilateral Air entry present and Clear to Auscultation, no Crackles, no wheezes Abdomen: Bowel Sound present, Soft and no tenderness Extremities: no Pedal edema, no calf tenderness Neurology: Grossly no focal neuro deficit.  Data Reviewed: CBC:  Recent Labs Lab 05/27/15 1300 05/28/15 0551  WBC 7.0 7.5  NEUTROABS 5.5  --   HGB 11.9* 9.6*  HCT 32.5* 26.5*  MCV 97.0 96.0  PLT 300 233   Basic Metabolic Panel:  Recent Labs Lab 05/24/15 0901 05/27/15 1300 05/27/15 1701 05/28/15 0551  NA 125* 126* 128* 132*  K 2.8* 3.6 3.1* 3.2*  CL 76* 84* 90* 96*  CO2 34* GLUCOSE 115* 103* 100* 107*  BUN CREATININE 0.81  0.74 0.66 0.59  CALCIUM 8.8* 8.4* 7.8* 7.7*  MG  --   --  1.0*  --    Liver Function Tests:  Recent Labs Lab 05/27/15 1300 05/28/15 0551  AST 60* 53*  ALT 18 14  ALKPHOS 121 106  BILITOT 1.6* 1.1  PROT 6.9 5.7*  ALBUMIN 3.4* 2.8*    Recent Labs Lab 05/27/15 1300  LIPASE 18   No results for input(s): AMMONIA in the last 168 hours.  Cardiac Enzymes:  Recent Labs Lab 05/27/15 1300 05/27/15 1701  CKTOTAL  --  47  TROPONINI <0.03  --     BNP (last 3 results)  Recent Labs  05/27/15 1300  BNP 206.4*    CBG: No results for input(s): GLUCAP in the last 168 hours.  Recent Results (from the past 240 hour(s))  MRSA PCR Screening     Status: None   Collection Time: 05/27/15  7:05 PM  Result Value Ref Range Status   MRSA by PCR NEGATIVE NEGATIVE Final    Comment:        The GeneXpert MRSA Assay (FDA approved for NASAL specimens only), is one component of a comprehensive MRSA colonization surveillance program. It is not intended to diagnose MRSA infection nor to guide or monitor treatment for MRSA infections.      Studies: Dg Abd 2 Views  05/27/2015  CLINICAL DATA:  N/V after fall today; hx perforated bowel years ago per pt EXAM: ABDOMEN - 2 VIEW COMPARISON:  12/19/2014 FINDINGS: Stable foreign body in the pelvis as previously described most consistent with intrauterine device. No abnormally dilated loops of bowel. No evidence of free air. IMPRESSION: No acute findings Electronically Signed   By: Esperanza Heir M.D.   On: 05/27/2015 17:58     Scheduled Meds: . bisacodyl  10 mg Rectal Once  . enoxaparin (LOVENOX) injection  40 mg Subcutaneous Q24H  . feeding supplement (ENSURE ENLIVE)  237 mL Oral BID BM  . folic acid  1 mg Oral Daily  . multivitamin with minerals  1 tablet Oral Daily  . nicotine  21 mg Transdermal Daily  . polyethylene glycol  17 g Oral BID  . potassium chloride  40 mEq Oral Daily  . senna-docusate  1 tablet Oral BID  . sodium chloride  flush  3 mL Intravenous Q12H  . tamsulosin  0.4 mg Oral Daily  . thiamine  100 mg Oral Daily   Or  . thiamine  100 mg Intravenous Daily   Continuous Infusions: . sodium chloride 125 mL/hr at 05/28/15 0919   PRN Meds: acetaminophen **OR** acetaminophen, bisacodyl, LORazepam **OR** LORazepam, ondansetron **OR** ondansetron (ZOFRAN) IV  Time spent: 30 minutes  Author: Lynden Oxford, MD [facility] Pager: [phone] 05/28/2015 4:44 PM  If 7PM-7AM, please contact night-coverage at [email or web address], password [facility]

## 2015-05-28 NOTE — Progress Notes (Signed)
Patient successfully voided 400cc after Flomax and IV bolus.  Earnest Conroy. Clelia Croft, RN

## 2015-05-28 NOTE — Progress Notes (Signed)
Patient's BP at 1952 was 84/58. HR in the 100's. No complaints of dizziness. On call was notified and told RN to give a 250cc bolus and to recheck. Bolus is going now and BP was be rechecked at 2130.

## 2015-05-29 ENCOUNTER — Inpatient Hospital Stay (HOSPITAL_COMMUNITY): Payer: Medicare Other

## 2015-05-29 DIAGNOSIS — I4729 Other ventricular tachycardia: Secondary | ICD-10-CM

## 2015-05-29 DIAGNOSIS — E440 Moderate protein-calorie malnutrition: Secondary | ICD-10-CM | POA: Diagnosis present

## 2015-05-29 DIAGNOSIS — I472 Ventricular tachycardia: Secondary | ICD-10-CM

## 2015-05-29 DIAGNOSIS — R55 Syncope and collapse: Secondary | ICD-10-CM

## 2015-05-29 LAB — BASIC METABOLIC PANEL
ANION GAP: 9 (ref 5–15)
Anion gap: 9 (ref 5–15)
BUN: 11 mg/dL (ref 6–20)
BUN: 9 mg/dL (ref 6–20)
CALCIUM: 7.4 mg/dL — AB (ref 8.9–10.3)
CO2: 22 mmol/L (ref 22–32)
CO2: 23 mmol/L (ref 22–32)
CREATININE: 0.52 mg/dL (ref 0.44–1.00)
Calcium: 7.2 mg/dL — ABNORMAL LOW (ref 8.9–10.3)
Chloride: 100 mmol/L — ABNORMAL LOW (ref 101–111)
Chloride: 102 mmol/L (ref 101–111)
Creatinine, Ser: 0.47 mg/dL (ref 0.44–1.00)
GFR calc Af Amer: >60 mL/min (ref >60)
GFR calc Af Amer: >60 mL/min (ref >60)
GLUCOSE: 168 mg/dL — AB (ref 65–99)
GLUCOSE: 144 mg/dL — AB (ref 65–99)
POTASSIUM: 3.1 mmol/L — AB (ref 3.5–5.1)
POTASSIUM: 3.5 mmol/L (ref 3.5–5.1)
SODIUM: 131 mmol/L — AB (ref 135–145)
Sodium: 134 mmol/L — ABNORMAL LOW (ref 135–145)

## 2015-05-29 LAB — TROPONIN I: Troponin I: <0.03 ng/mL (ref <0.031)

## 2015-05-29 LAB — MAGNESIUM
MAGNESIUM: 1.1 mg/dL — AB (ref 1.7–2.4)
Magnesium: 1.8 mg/dL (ref 1.7–2.4)

## 2015-05-29 MED ORDER — GUAIFENESIN ER 600 MG PO TB12
600.0000 mg | ORAL_TABLET | Freq: Two times a day (BID) | ORAL | Status: DC
  Administered 2015-05-29 – 2015-05-30 (×3): 600 mg via ORAL
  Filled 2015-05-29 (×3): qty 1

## 2015-05-29 MED ORDER — POTASSIUM CHLORIDE CRYS ER 20 MEQ PO TBCR
40.0000 meq | EXTENDED_RELEASE_TABLET | Freq: Once | ORAL | Status: AC
  Administered 2015-05-29: 40 meq via ORAL
  Filled 2015-05-29: qty 2

## 2015-05-29 MED ORDER — ENSURE ENLIVE PO LIQD: 237.0000 mL | Freq: Two times a day (BID) | ORAL | Status: DC

## 2015-05-29 MED ORDER — DIPHENHYDRAMINE HCL 25 MG PO CAPS: 25.0000 mg | ORAL_CAPSULE | Freq: Four times a day (QID) | ORAL | Status: DC | PRN

## 2015-05-29 MED ORDER — METOPROLOL TARTRATE 25 MG PO TABS
25.0000 mg | ORAL_TABLET | Freq: Two times a day (BID) | ORAL | Status: DC
  Administered 2015-05-29 – 2015-05-30 (×2): 25 mg via ORAL
  Filled 2015-05-29 (×2): qty 1

## 2015-05-29 MED ORDER — ALBUTEROL SULFATE HFA 108 (90 BASE) MCG/ACT IN AERS: 2.0000 | INHALATION_SPRAY | Freq: Four times a day (QID) | RESPIRATORY_TRACT | Status: DC | PRN

## 2015-05-29 MED ORDER — PREDNISONE 10 MG PO TABS: ORAL_TABLET | ORAL | Status: DC

## 2015-05-29 MED ORDER — POTASSIUM CHLORIDE CRYS ER 20 MEQ PO TBCR: 40.0000 meq | EXTENDED_RELEASE_TABLET | Freq: Every day | ORAL | Status: DC

## 2015-05-29 MED ORDER — DIPHENHYDRAMINE HCL 25 MG PO TABS: 25.0000 mg | ORAL_TABLET | Freq: Four times a day (QID) | ORAL | Status: DC | PRN

## 2015-05-29 MED ORDER — MAGNESIUM OXIDE 400 (241.3 MG) MG PO TABS: 400.0000 mg | ORAL_TABLET | Freq: Two times a day (BID) | ORAL | Status: DC

## 2015-05-29 MED ORDER — SENNOSIDES-DOCUSATE SODIUM 8.6-50 MG PO TABS: 1.0000 | ORAL_TABLET | Freq: Two times a day (BID) | ORAL | Status: DC

## 2015-05-29 MED ORDER — HYDROCORTISONE 1 % EX CREA: 1.0000 "application " | TOPICAL_CREAM | Freq: Two times a day (BID) | CUTANEOUS | Status: AC

## 2015-05-29 MED ORDER — GUAIFENESIN ER 600 MG PO TB12: 600.0000 mg | ORAL_TABLET | Freq: Two times a day (BID) | ORAL | Status: DC

## 2015-05-29 MED ORDER — POLYETHYLENE GLYCOL 3350 17 G PO PACK: 17.0000 g | PACK | Freq: Two times a day (BID) | ORAL | Status: DC

## 2015-05-29 MED ORDER — MAGNESIUM SULFATE 2 GM/50ML IV SOLN
2.0000 g | Freq: Once | INTRAVENOUS | Status: AC
  Administered 2015-05-29: 2 g via INTRAVENOUS
  Filled 2015-05-29: qty 50

## 2015-05-29 MED ORDER — FOLIC ACID 1 MG PO TABS: 1.0000 mg | ORAL_TABLET | Freq: Every day | ORAL | Status: DC

## 2015-05-29 MED ORDER — MAGNESIUM OXIDE 400 (241.3 MG) MG PO TABS
400.0000 mg | ORAL_TABLET | Freq: Two times a day (BID) | ORAL | Status: DC
  Administered 2015-05-29 – 2015-05-30 (×2): 400 mg via ORAL
  Filled 2015-05-29 (×2): qty 1

## 2015-05-29 MED ORDER — THIAMINE HCL 100 MG PO TABS: 100.0000 mg | ORAL_TABLET | Freq: Every day | ORAL | Status: DC

## 2015-05-29 MED ORDER — NICOTINE 21 MG/24HR TD PT24: 21.0000 mg | MEDICATED_PATCH | Freq: Every day | TRANSDERMAL | Status: DC

## 2015-05-29 MED ORDER — BISACODYL 10 MG RE SUPP: 10.0000 mg | Freq: Every day | RECTAL | Status: DC | PRN

## 2015-05-29 MED ORDER — PREDNISONE 20 MG PO TABS
40.0000 mg | ORAL_TABLET | Freq: Once | ORAL | Status: AC
  Administered 2015-05-29: 40 mg via ORAL
  Filled 2015-05-29: qty 2

## 2015-05-29 MED FILL — predniSONE 10 MG TABS: 10 | 7 days supply | Qty: 16 | Fill #0

## 2015-05-29 MED FILL — FOLIC ACID 1 MG TABLET: 1 | 30 days supply | Qty: 30 | Fill #0

## 2015-05-29 MED FILL — PROAIR HFA 90 MCG INHALER: 108 (90 BAS | 16 days supply | Qty: 9 | Fill #0

## 2015-05-29 MED FILL — KLOR-CON M20 TABLET: 20 | 15 days supply | Qty: 30 | Fill #0

## 2015-05-29 MED FILL — POLYETHYLENE GLYCOL 3350: 7 days supply | Qty: 255 | Fill #0

## 2015-05-29 NOTE — Progress Notes (Signed)
Spoke with pt and family at bedside concerning discharge plans. Pt and family member states that pt has 24hrs coverage with [facility] and [facility].  [facility] in house rep called.

## 2015-05-29 NOTE — Progress Notes (Signed)
CSW received referral for home health needs.   Inappropriate CSW referral.   RNCM to follow for home health needs.   CSW signing off.   Loletta Specter, MSW, LCSW Clinical Social Work [phone]

## 2015-05-29 NOTE — Progress Notes (Signed)
Initial Nutrition Assessment  DOCUMENTATION CODES:   Non-severe (moderate) malnutrition in context of chronic illness  INTERVENTION:  - Continue Ensure Enlive po BID, each supplement provides 350 kcal and 20 grams of protein - Encourage PO intakes with meals and supplement - RD will continue to monitor for needs  NUTRITION DIAGNOSIS:   Inadequate oral intake related to acute illness, poor appetite as evidenced by per patient/family report  GOAL:   Patient will meet greater than or equal to 90% of their needs  MONITOR:   PO intake, Supplement acceptance, Weight trends, Labs, Skin, I & O's  REASON FOR ASSESSMENT:   Malnutrition Screening Tool  ASSESSMENT:   65 y.o. female with Past medical history of colon resection for perforation. Patient presented with complains of recurrent fall. Patient mentions that she has been having increasing frequency of recurrent fall over last few weeks. Her fall is described as she will be losing balance. She denies any dizziness or lightheadedness or passing out episode she denies any palpitation she denies any localized weakness or weakness of bilateral lower extremities.  Pt seen for MST. BMI indicates overweight status. Pt ate 50% breakfast, 25% lunch, and 50% dinner yesterday (2/19) per chart review and 50% of breakfast this AM. She was also eating snacks brought by daughter (chicken nuggets and hashbrown from [employer]) shortly before RD visit. Pt denies any pain or nausea at this time. She state that appetite has been improving since admission and she has been drinking Ensure although she was not drinking nutrition supplements PTA.   Daughter states that pt got sick the week before Christmas and since that time appetite has been decreased. She states that when pt gets congested she often vomits and refuses PO intakes. Daughter states that pt recently had pneumonia and bronchitis. She states that pt's UBW is 163 lbs and that she last weighed this  in December 2016. Compared to weight from admission (153 lbs) pt has lost 10 lbs (6% body weight) in the past 2 months which is significant for time frame. No muscle or fat wasting noted during physical assessment.  Not meeting needs PTA. Encouraged pt to continue drinking Ensure between meals. Medications reviewed. Labs reviewed; Na: 131 mmol/L, K: 3.1 mmol/L, Cl: 100 mmol/L, Ca: 7.2 mg/dL, Mg: 1.1 mg/dL.   Diet Order:  Diet regular Room service appropriate?: Yes; Fluid consistency:: Thin  Skin:  Reviewed, no issues  Last BM:  2/19  Height:   Ht Readings from Last 1 Encounters:  05/27/15  (1.575 m)    Weight:   Wt Readings from Last 1 Encounters:  05/29/15 158 lb 11.7 oz (72 kg)    Ideal Body Weight:  50 kg (kg)  BMI:  Body mass index is 29.02 kg/(m^2).  Estimated Nutritional Needs:   Kcal:  1450-1650  Protein:  72-85 grams  Fluid:  2 L/day  EDUCATION NEEDS:   No education needs identified at this time     Trenton Gammon, RD, LDN Inpatient Clinical Dietitian Pager # [phone] After hours/weekend pager # [phone]

## 2015-05-29 NOTE — Progress Notes (Signed)
  Echocardiogram 2D Echocardiogram has been performed.  [name] 05/29/2015, 4:34 PM

## 2015-05-29 NOTE — Evaluation (Signed)
Physical Therapy One Time Evaluation Patient Details Name: Rachel Williams MRN: [id number] DOB: [birth year] Today's Date: 05/29/2015   History of Present Illness  Pt is a 65 year old female admitted with recurrent fall and hyponatremia.  PMhx of pneumonia and colon resection  Clinical Impression  Patient evaluated by Physical Therapy with no further acute PT needs identified. All education has been completed and the patient has no further questions.  Pt requiring min assist for mobility at this time.  Family present and reports she will have 24/7 assist at home.  Pt already has all DME needs and was recently using w/c and BSC prior to admission.  Pt also already set up with HHPT however had been unable to tolerate.  Recommend resuming HHPT upon d/c and 24/7 assist for safety at home due to falls. See below for any follow-up Physical Therapy or equipment needs. PT is signing off. Thank you for this referral.     Follow Up Recommendations Home health PT;Supervision for mobility/OOB    Equipment Recommendations  None recommended by PT    Recommendations for Other Services       Precautions / Restrictions Precautions Precautions: Fall Precaution Comments: family reports rib fx and brace to R wrist present (unable to find further info in chart)      Mobility  Bed Mobility Overal bed mobility: Needs Assistance Bed Mobility: Supine to Sit;Sit to Supine     Supine to sit: Supervision Sit to supine: Supervision   General bed mobility comments: increased time and effort  Transfers Overall transfer level: Needs assistance Equipment used: Rolling walker (2 wheeled) Transfers: Sit to/from Stand Sit to Stand: Min assist         General transfer comment: slight assist to rise  Ambulation/Gait Ambulation/Gait assistance: Min guard Ambulation Distance (Feet): 10 Feet (total) Assistive device: Rolling walker (2 wheeled) Gait Pattern/deviations: Step-through pattern;Decreased stride  length     General Gait Details: pt ambulated 5 feet forward and then stated she needed to return to bed due to fatigue and ambulated backwards with cues for RW safety upon returning to bed  Stairs            Wheelchair Mobility    Modified Rankin (Stroke Patients Only)       Balance Overall balance assessment: History of Falls;Needs assistance         Standing balance support: Bilateral upper extremity supported Standing balance-Leahy Scale: Poor                               Pertinent Vitals/Pain Pain Assessment: No/denies pain    Home Living Family/patient expects to be discharged to:: Private residence Living Arrangements: Alone Available Help at Discharge: Available 24 hours/day;Personal care attendant;Family         Home Layout: Two level Home Equipment: [place] - 2 wheels;Bedside commode;Wheelchair - manual      Prior Function Level of Independence: Needs assistance   Gait / Transfers Assistance Needed: requiring more assist recently, PTA was using w/c and BSC at home due to falls           Hand Dominance        Extremity/Trunk Assessment   Upper Extremity Assessment: Generalized weakness           Lower Extremity Assessment: Generalized weakness         Communication   Communication: No difficulties  Cognition Arousal/Alertness: Awake/alert Behavior During Therapy: [facility] for  tasks assessed/performed Overall Cognitive Status: Within Functional Limits for tasks assessed                      General Comments      Exercises        Assessment/Plan    PT Assessment All further PT needs can be met in the next venue of care  PT Diagnosis Difficulty walking;Generalized weakness   PT Problem List Decreased strength;Decreased activity tolerance;Decreased mobility;Decreased balance;Pain;Decreased knowledge of use of DME  PT Treatment Interventions     PT Goals (Current goals can be found in the Care Plan  section) Acute Rehab PT Goals PT Goal Formulation: All assessment and education complete, DC therapy    Frequency     Barriers to discharge        Co-evaluation               End of Session Equipment Utilized During Treatment:  (declined due to rib fxs) Activity Tolerance: Patient limited by fatigue Patient left: in bed;with call bell/phone within reach;with bed alarm set;with family/visitor present Nurse Communication: Mobility status         Time: 1135-1145 PT Time Calculation (min) (ACUTE ONLY): 10 min   Charges:   PT Evaluation $PT Eval Low Complexity: 1 Procedure     PT G Codes:        Madonna Flegal,KATHrine E 05/29/2015, 2:37 PM Carmelia Bake, PT, DPT 05/29/2015 Pager: [phone]

## 2015-05-29 NOTE — Progress Notes (Signed)
[facility] Progress Note  Patient: Rachel Williams XLK:[id number]   PCP: Neldon Labella, MD DOB: Oct 01, 1950   DOA: 05/27/2015   DOS: 05/29/2015   Date of Service: the patient was seen and examined on 05/29/2015  Subjective: Patient did not have any Complaint other than rash on her back. Does not have any nausea or vomiting. Tolerating oral diet with minimal intake. No diarrhea no constipation. No burning urination. In the afternoon patient had an episode of NSVT 3. She will remain asymptomatic. Nutrition: Tolerating oral diet Activity: Ambulating in the room with assistance Last BM: 05/28/2015  Assessment and Plan: 1. Hyponatremia Likely secondary to dehydration and poor oral intake. Currently resolved. We will continue to closely monitor.  2. Recurrent fall. Likely multifactorial. Patient has chronic alcohol use, chronic hyponatremia. CT of the head is unremarkable no focal deficit on examination. TSH and CK are unremarkable. Patient has mild unsteadiness at present. Continue thiamine  3. Chronic alcohol use. Monitor on telemetry and monitored with CIWA protocol. Continue thiamine  4. Active smoker. Nicotine patch. Counseled against smoking.  5. Hypomagnesemia hypokalemia. Replacing. Monitor and recheck in the morning.  6. Dehydration with chronic constipation. X-ray abdomen does not show any obstruction. Will use MiraLAX and Senokot. Dulcolax suppository 1. Soapsuds enema 1, continue aggressive hydration and will increase the bowel regimen  7. Acute pharyngitis. PCP has started the patient on Levaquin for 10 days. Given that is no evidence of pneumonia on the chest x-ray I would reduce the duration to 5 days. Patient will be given low-dose prednisone taper.  8. Rash. Probably drug induced. Will use Benadryl, hydrocortisone cream as well as oral prednisone.  9. Nonsustained run of atrial tachycardia. Next and patient has episodes of nonsustained ventricular  tachycardia. We will get echo Probably in the setting of hypokalemia as well as hypomagnesemia. But this can explain patient's recurrent fall.  Nutrition: Regular diet DVT Prophylaxis: subcutaneous Heparin  Advance goals of care discussion: Full code  Brief Summary of Hospitalization:  Daily update, Procedures: None Consultants: None Antibiotics: Anti-infectives    Start     Dose/Rate Route Frequency Ordered Stop   05/28/15 1000  levofloxacin (LEVAQUIN) tablet 500 mg     500 mg Oral Daily 05/27/15 1652 05/28/15 0918   05/27/15 1415  levofloxacin (LEVAQUIN) IVPB 750 mg     750 mg 100 mL/hr over 90 Minutes Intravenous  Once 05/27/15 1401 05/27/15 1635       Family Communication: family was present at bedside, at the time of interview.  Opportunity was given to ask question and all questions were answered satisfactorily.   Disposition:  Expected discharge date: 05/30/2015 Barriers to safe discharge: Constipation   Intake/Output Summary (Last 24 hours) at 05/29/15 2016 Last data filed at 05/29/15 1843  Gross per 24 hour  Intake   2480 ml  Output   1300 ml  Net   1180 ml   Filed Weights   05/27/15 1854 05/28/15 0557 05/29/15 0605  Weight: 69.4 kg (153 lb) 71.4 kg (157 lb 6.5 oz) 72 kg (158 lb 11.7 oz)    Objective: Physical Exam: Filed Vitals:   05/28/15 1330 05/28/15 2127 05/29/15 0605 05/29/15 1605  BP: 104/76 117/87 142/92 126/74  Pulse: 102 95 97 96  Temp: 98.4 F (36.9 C) 98.4 F (36.9 C) 98 F (36.7 C) 98 F (36.7 C)  TempSrc: Oral Oral Oral Oral  Resp: 16 18 18 20   Height:      Weight:   72 kg (  158 lb 11.7 oz)   SpO2: 96% 97% 96% 94%     General: Appear in mild distress, NO Rash; Oral Mucosa moist. Cardiovascular: S1 and S2 Present, no Murmur, no JVD Respiratory: Bilateral Air entry present and Clear to Auscultation, no Crackles, no wheezes Abdomen: Bowel Sound present, Soft and no tenderness Extremities: no Pedal edema, no calf  tenderness Neurology: Grossly no focal neuro deficit.  Data Reviewed: CBC:  Recent Labs Lab 05/27/15 1300 05/28/15 0551  WBC 7.0 7.5  NEUTROABS 5.5  --   HGB 11.9* 9.6*  HCT 32.5* 26.5*  MCV 97.0 96.0  PLT 300 233   Basic Metabolic Panel:  Recent Labs Lab 05/27/15 1300 05/27/15 1701 05/28/15 0551 05/29/15 0809 05/29/15 1344  NA 126* 128* 132* 131* 134*  K 3.6 3.1* 3.2* 3.1* 3.5  CL 84* 90* 96* 100* 102  CO2 GLUCOSE 103* 100* 107* 168* 144*  BUN CREATININE 0.74 0.66 0.59 0.52 0.47  CALCIUM 8.4* 7.8* 7.7* 7.2* 7.4*  MG  --  1.0*  --  1.1* 1.8   Liver Function Tests:  Recent Labs Lab 05/27/15 1300 05/28/15 0551  AST 60* 53*  ALT 18 14  ALKPHOS 121 106  BILITOT 1.6* 1.1  PROT 6.9 5.7*  ALBUMIN 3.4* 2.8*    Recent Labs Lab 05/27/15 1300  LIPASE 18   No results for input(s): AMMONIA in the last 168 hours.  Cardiac Enzymes:  Recent Labs Lab 05/27/15 1300 05/27/15 1701 05/29/15 1345  CKTOTAL  --  47  --   TROPONINI <0.03  --  <0.03    BNP (last 3 results)  Recent Labs  05/27/15 1300  BNP 206.4*    CBG: No results for input(s): GLUCAP in the last 168 hours.  Recent Results (from the past 240 hour(s))  Culture, blood (routine x 2)     Status: None (Preliminary result)   Collection Time: 05/27/15  2:10 PM  Result Value Ref Range Status   Specimen Description BLOOD LEFT ARM  5 ML IN [facility] BOTTLE  Final   Special Requests NONE  Final   Culture   Final    NO GROWTH 2 DAYS Performed at [facility]    Report Status PENDING  Incomplete  Culture, blood (routine x 2)     Status: None (Preliminary result)   Collection Time: 05/27/15  2:55 PM  Result Value Ref Range Status   Specimen Description BLOOD RIGHT ARM  5 ML IN [facility] BOTTLE  Final   Special Requests NONE  Final   Culture   Final    NO GROWTH 2 DAYS Performed at [facility]    Report Status PENDING  Incomplete  MRSA PCR Screening      Status: None   Collection Time: 05/27/15  7:05 PM  Result Value Ref Range Status   MRSA by PCR NEGATIVE NEGATIVE Final    Comment:        The GeneXpert MRSA Assay (FDA approved for NASAL specimens only), is one component of a comprehensive MRSA colonization surveillance program. It is not intended to diagnose MRSA infection nor to guide or monitor treatment for MRSA infections.      Studies: No results found.   Scheduled Meds: . enoxaparin (LOVENOX) injection  40 mg Subcutaneous Q24H  . feeding supplement (ENSURE ENLIVE)  237 mL Oral BID BM  . folic acid  1 mg Oral Daily  . guaiFENesin  600 mg Oral BID  . magnesium oxide  400 mg Oral BID  . metoprolol tartrate  25 mg Oral BID  . multivitamin with minerals  1 tablet Oral Daily  . nicotine  21 mg Transdermal Daily  . polyethylene glycol  17 g Oral BID  . potassium chloride  40 mEq Oral Daily  . senna-docusate  2 tablet Oral BID  . sodium chloride flush  3 mL Intravenous Q12H  . tamsulosin  0.4 mg Oral Daily  . thiamine  100 mg Oral Daily   Or  . thiamine  100 mg Intravenous Daily   Continuous Infusions:   PRN Meds: acetaminophen **OR** acetaminophen, bisacodyl, diphenhydrAMINE, LORazepam **OR** LORazepam, ondansetron **OR** ondansetron (ZOFRAN) IV  Time spent: 30 minutes  Author: Lynden Oxford, MD [facility] Pager: [phone] 05/29/2015 8:16 PM  If 7PM-7AM, please contact night-coverage at [email or web address], password [facility]

## 2015-05-29 NOTE — Progress Notes (Addendum)
CCMD alerted me that patient had a 6 beat run of v-tach.  Pt was asymptomatic.  MD made aware.

## 2015-05-30 LAB — BASIC METABOLIC PANEL
Anion gap: 7 (ref 5–15)
BUN: 11 mg/dL (ref 6–20)
CHLORIDE: 101 mmol/L (ref 101–111)
CO2: 22 mmol/L (ref 22–32)
Calcium: 7.8 mg/dL — ABNORMAL LOW (ref 8.9–10.3)
Creatinine, Ser: 0.41 mg/dL — ABNORMAL LOW (ref 0.44–1.00)
GFR calc Af Amer: >60 mL/min (ref >60)
GFR calc non Af Amer: >60 mL/min (ref >60)
GLUCOSE: 127 mg/dL — AB (ref 65–99)
POTASSIUM: 4.3 mmol/L (ref 3.5–5.1)
Sodium: 130 mmol/L — ABNORMAL LOW (ref 135–145)

## 2015-05-30 LAB — MAGNESIUM: Magnesium: 1.5 mg/dL — ABNORMAL LOW (ref 1.7–2.4)

## 2015-05-30 MED ORDER — METOPROLOL TARTRATE 25 MG PO TABS: 25.0000 mg | ORAL_TABLET | Freq: Two times a day (BID) | ORAL | Status: DC

## 2015-05-30 MED ORDER — MAGNESIUM SULFATE 2 GM/50ML IV SOLN: 2.0000 g | Freq: Once | INTRAVENOUS | Status: DC

## 2015-05-30 MED ORDER — MAGNESIUM SULFATE 2 GM/50ML IV SOLN
2.0000 g | Freq: Once | INTRAVENOUS | Status: AC
  Administered 2015-05-30: 2 g via INTRAVENOUS
  Filled 2015-05-30: qty 50

## 2015-05-30 MED FILL — METOPROLOL TARTRATE 25 MG T: 25 | 15 days supply | Qty: 30 | Fill #0

## 2015-05-30 NOTE — Progress Notes (Signed)
Went over all discharge information with patient and family.  All questions answered. IV taken out.  Made pt aware that prescriptions were sent to pharmacy.  Pt wheeled out.

## 2015-05-31 NOTE — Discharge Summary (Addendum)
[facility] Discharge Summary   Patient: Rachel Williams:[id number]   PCP: Neldon Labella, MD DOB: 1951-02-14   Date of admission: 05/27/2015   Date of discharge: 05/30/2015    Discharge Diagnoses:  Principal Problem:   Hyponatremia Active Problems:   Recurrent falls   Facial bruising   Hypomagnesemia   Hypokalemia   Alcohol use (HCC), daily 2-3 drinks of vodka   Smoker   Dehydration   Vertebral compression fracture (HCC)   Acute pharyngitis   Chronic constipation   Generalized weakness   Malnutrition of moderate degree   NSVT (nonsustained ventricular tachycardia) (HCC)  Recommendations for Outpatient Follow-up:  1.  Please follow-up with PCP in one week, get repeat blood work, that reevaluated of her pneumonia as well as recurrent fall. 2. Continue [facility] home [facility]  Follow-up Information    Follow up with Neldon Labella, MD. Schedule an appointment as soon as possible for a visit in 1 week.   Specialty:  Family Medicine   Why:  with repeat blood work BMP and magnesium    Contact information:   [address] [place] [address] [phone]      Diet recommendation: Regular diet  Activity: The patient is advised to gradually reintroduce usual activities.  Discharge Condition: fair  History of present illness: As per the H and P dictated on admission, "Rachel Williams is a 65 y.o. female with Past medical history of colon resection for perforation. Patient presented with complains of recurrent fall. Patient mentions that she has been having increasing frequency of recurrent fall over last few weeks. Her fall are described as she will be losing balance. She denies any dizziness or lightheadedness or passing out episode she denies any palpitation she denies any localized weakness or weakness of bilateral lower extremities. She denies any loss of control of bowel or bladder as well. She denies any focal deficit noting numbness at the time of my  evaluation. She has sustained a facial bruise as well as a wrist fracture and was recently seen in the ER. She has a history of chronic constipation and was admitted in the past for the same and has been having no bowel movement for last 2 weeks. She also complains of occasional on and off cough but when she has a call she has nausea and she has borderline intake and has not had anything to eat for last 3 days. She also was seen by PCP recently and was started on Levaquin for suspected pneumonia. No other recent change in meds reported."  Hospital Course:  Patient presented with recurrent fall. Recently seen in the ER with a wrist fracture because of the fall. No hypoxia or tachycardia or cardiac arrhythmias noted.  Summary of her active problems in the hospital is as following. 1. Hyponatremia Likely secondary to dehydration and poor oral intake. Currently resolved after IV hydration. No weakness or deficit identified  2. Recurrent fall. Likely multifactorial. Patient has chronic alcohol use, chronic hyponatremia. CT of the head is unremarkable no focal deficit on examination. TSH and CK are unremarkable. Patient has mild unsteadiness at present. Continue thiamine  3. Chronic alcohol use. monitored with CIWA protocol. No withdrawal noted here in the hospital recommended patient to moderate her alcohol intake to avoid hyponatremia and frequent fall as well as poor oral intake. Continue thiamine  4. Active smoker. Nicotine patch. Counseled against smoking. Patient has blatantly refused to quit smoking.  5. Hypomagnesemia hypokalemia. Etiology is unclear since the patient did  not have any GI loss or urinary. Primarily poor oral intake We'll place her on regular scheduled replacement and recommend to follow-up with PCP with recheck of the lab work  6. Dehydration with chronic constipation. X-ray abdomen does not show any obstruction. Will use MiraLAX and Senokot.  Soapsuds enema 1,  patient recommended to remain compliant with bowel regimen on discharge  7. Acute pharyngitis. Suspected COPD with exacerbation without any hypoxia PCP has started the patient on Levaquin for 10 days. Given that is no evidence of pneumonia on the chest x-ray I would reduce the duration to 5 days. Given patient's history of prolonged smoking with bilateral wheezing Patient will be given low-dose prednisone taper along with albuterol inhalers.  8. Rash. Probably drug induced. Allergic reaction to Levaquin. Recommended patient and family to avoid taking any fluoroquinolone in future. Will use Benadryl, hydrocortisone cream as well as oral prednisone.  9. Nonsustained run of atrial tachycardia.  RV dysfunction with pulmonary hypertension had 3 episodes of 6 beats nonsustained ventricular tachycardia. Probably in the setting of hypokalemia as well as hypomagnesemia. EKG shows right bundle branch block, troponins are negative. Patient did not have any acute symptoms at the time of the events. Echogram shows normal ejection fraction. Patient will need outpatient cardiology referral for pulmonary hypertension seen on echocardiogram. Likely from undiagnosed COPD.   All other chronic medical condition were stable during the hospitalization.  Patient was seen by physical therapy, who recommended home health which was arranged by Child psychotherapist and case Production designer, theatre/television/film. On the day of the discharge the patient's vitals remained stable and telemetry did not have any events, patient requested to be discharged home and no other acute medical condition were reported by patient. the patient was felt safe to be discharge at home with home health.  Procedures and Results:  Echocardiogram  Study Conclusions  - Left ventricle: The cavity size was normal. Systolic function was normal. The estimated ejection fraction was in the range of 60% to 65%. Wall motion was normal; there were no regional wall motion  abnormalities. - Aortic valve: Trileaflet; normal thickness, mildly calcified leaflets. - Mitral valve: There was trivial regurgitation. - Right ventricle: The cavity size was mildly dilated. Wall thickness was normal. Systolic function was severely reduced. - Right atrium: The atrium was moderately dilated. - Tricuspid valve: There was mild regurgitation. - Pulmonary arteries: PA peak pressure: 56 mm Hg (S).  Impressions:  - The right ventricular systolic pressure was increased consistent with moderate pulmonary hypertension.  Consultations:  none  DISCHARGE MEDICATION: Discharge Medication List as of 05/30/2015 10:05 AM    START taking these medications   Details  albuterol (PROVENTIL HFA;VENTOLIN HFA) 108 (90 Base) MCG/ACT inhaler Inhale 2 puffs into the lungs every 6 (six) hours as needed for wheezing or shortness of breath., Starting 05/29/2015, Until Discontinued, Normal    bisacodyl (DULCOLAX) 10 MG suppository Place 1 suppository (10 mg total) rectally daily as needed for moderate constipation., Starting 05/29/2015, Until Discontinued, Normal    feeding supplement, ENSURE ENLIVE, (ENSURE ENLIVE) LIQD Take 237 mLs by mouth 2 (two) times daily between meals., Starting 05/29/2015, Until Discontinued, Normal    folic acid (FOLVITE) 1 MG tablet Take 1 tablet (1 mg total) by mouth daily., Starting 05/29/2015, Until Discontinued, Normal    guaiFENesin (MUCINEX) 600 MG 12 hr tablet Take 1 tablet (600 mg total) by mouth 2 (two) times daily., Starting 05/29/2015, Until Discontinued, Normal    hydrocortisone cream 1 % Apply 1 application topically  2 (two) times daily., Starting 05/29/2015, Until Sat 06/03/15, Normal    magnesium oxide (MAG-OX) 400 (241.3 Mg) MG tablet Take 1 tablet (400 mg total) by mouth 2 (two) times daily., Starting 05/29/2015, Until Discontinued, Normal    metoprolol tartrate (LOPRESSOR) 25 MG tablet Take 1 tablet (25 mg total) by mouth 2 (two) times daily.,  Starting 05/30/2015, Until Discontinued, Normal    nicotine (NICODERM CQ - DOSED IN MG/24 HOURS) 21 mg/24hr patch Place 1 patch (21 mg total) onto the skin daily., Starting 05/29/2015, Until Discontinued, Normal    polyethylene glycol (MIRALAX / GLYCOLAX) packet Take 17 g by mouth 2 (two) times daily., Starting 05/29/2015, Until Discontinued, Normal    potassium chloride SA (K-DUR,KLOR-CON) 20 MEQ tablet Take 2 tablets (40 mEq total) by mouth daily., Starting 05/29/2015, Until Discontinued, Normal    predniSONE (DELTASONE) 10 MG tablet Take  daily for 1days,Take  daily for 2days,Take  daily for 2days,Take  daily for 2days, then stop., Normal    senna-docusate (SENOKOT-S) 8.6-50 MG tablet Take 1 tablet by mouth 2 (two) times daily., Starting 05/29/2015, Until Discontinued, Normal    thiamine 100 MG tablet Take 1 tablet (100 mg total) by mouth daily., Starting 05/29/2015, Until Discontinued, Normal      CONTINUE these medications which have CHANGED   Details  diphenhydrAMINE (BENADRYL) 25 MG tablet Take 1 tablet (25 mg total) by mouth every 6 (six) hours as needed for itching or allergies., Starting 05/29/2015, Until Discontinued, Normal      STOP taking these medications     levofloxacin (LEVAQUIN) 500 MG tablet        Allergies  Allergen Reactions  . Penicillins     Has patient had a PCN reaction causing immediate rash, facial/tongue/throat swelling, SOB or lightheadedness with hypotension: Yes. Has patient had a PCN reaction causing severe rash involving mucus membranes or skin necrosis: No answer Has patient had a PCN reaction that required hospitalization No Has patient had a PCN reaction occurring within the last 10 years: No If all of the above answers are "NO", then may proceed with Cephalosporin use.    . Ciprofloxacin Rash  . Levofloxacin Rash    Rash on back after 5 days therapy.   . Nickel Rash    Anything with nickel in it.    Discharge Instructions     Diet general    Complete by:  As directed      Discharge instructions    Complete by:  As directed   It is important that you read following instructions as well as go over your medication list with RN to help you understand your care after this hospitalization.  Discharge Instructions: Please follow-up with PCP in one week with BMP  Please request your primary care physician to go over all Hospital Tests and Procedure/Radiological results at the follow up,  Please get all Hospital records sent to your PCP by signing hospital release before you go home.   Do not drive, operating heavy machinery, perform activities at heights, swimming or participation in water activities or provide baby sitting services if your were admitted for syncope, until you have been seen by Primary Care Physician or a Neurologist and advised to do so again. Do not take more than prescribed Pain, Sleep and Anxiety Medications. You were cared for by a hospitalist during your hospital stay. If you have any questions about your discharge medications or the care you received while you were in the hospital after you are  discharged, you can call the unit and ask to speak with the hospitalist on call if the hospitalist that took care of you is not available.  Once you are discharged, your primary care physician will handle any further medical issues. Please note that NO REFILLS for any discharge medications will be authorized once you are discharged, as it is imperative that you return to your primary care physician (or establish a relationship with a primary care physician if you do not have one) for your aftercare needs so that they can reassess your need for medications and monitor your lab values. You Must read complete instructions/literature along with all the possible adverse reactions/side effects for all the Medicines you take and that have been prescribed to you. Take any new Medicines after you have completely understood  and accept all the possible adverse reactions/side effects. Wear Seat belts while driving. If you have smoked or chewed Tobacco in the last 2 yrs please stop smoking and/or stop any Recreational drug use.     Increase activity slowly    Complete by:  As directed           Discharge Exam: Filed Weights   05/28/15 0557 05/29/15 0605 05/30/15 0454  Weight: 71.4 kg (157 lb 6.5 oz) 72 kg (158 lb 11.7 oz) 72.5 kg (159 lb 13.3 oz)   Filed Vitals:   05/29/15 2201 05/30/15 0454  BP: 125/84 126/80  Pulse: 108 88  Temp: 98.7 F (37.1 C) 98.2 F (36.8 C)  Resp: 20 20   General: Appear in mild distress, no Rash; Oral Mucosa moist. Cardiovascular: S1 and S2 Present, no Murmur, no JVD Respiratory: Bilateral Air entry present and bilateral basal Crackles, no wheezes Abdomen: Bowel Sound present, Soft and no tenderness Extremities: no Pedal edema, no calf tenderness Neurology: Grossly no focal neuro deficit.  The results of significant diagnostics from this hospitalization (including imaging, microbiology, ancillary and laboratory) are listed below for reference.    Significant Diagnostic Studies: Dg Chest 2 View  05/27/2015  CLINICAL DATA:  Pt arrived via EMS with report of generalized weakness, poor appetite, n/v x3 in past 24hrs and no BM x2 weeks. Pt has been recently treated for PNA and falling. Smoker. EXAM: CHEST  2 VIEW COMPARISON:  12/19/2014 FINDINGS: The cardiac silhouette is normal in size and configuration. No mediastinal or hilar masses or evidence of adenopathy. Lungs are clear.  No pleural effusion or pneumothorax. Mild wedge-shaped compression deformity of a mid thoracic vertebra appears new when compared to a lateral chest radiograph dated 04/29/2011, but is of unclear chronicity. Most recent prior study was an AP exam only. Bony thorax is demineralized but otherwise intact. IMPRESSION: 1. No acute cardiopulmonary disease. 2. Mild compression fracture of a mid thoracic vertebrae of  unclear chronicity, but new since January 2013. Electronically Signed   By: Amie Portland M.D.   On: 05/27/2015 14:36   Dg Wrist Complete Right  05/24/2015  CLINICAL DATA:  Pain following fall EXAM: RIGHT WRIST - COMPLETE 3+ VIEW COMPARISON:  None. FINDINGS: Frontal, oblique, lateral, and ulnar deviation scaphoid images were obtained. There is no demonstrable fracture or dislocation. Bones are diffusely osteoporotic. There is marked osteoarthritic change in the scaphotrapezial and first carpal -metacarpal joints. There is also narrowing of the radiocarpal joint. There is calcification in the triangular fibrocartilage region. IMPRESSION: No acute fracture or dislocation. 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 Calcification in the triangular fibrocartilage region may have degenerative etiology but also  may be indicative of chronic tear in this region. Electronically Signed   By: Bretta Bang III M.D.   On: 05/24/2015 08:26   Ct Head Wo Contrast  05/27/2015  CLINICAL DATA:  Pt arrived via EMS with report of generalized weakness, poor appetite, n/v x3 in past 24hrs and no BM x2 weeks. Pt has been recently treated for PNA and falling. EXAM: CT HEAD WITHOUT CONTRAST CT MAXILLOFACIAL WITHOUT CONTRAST TECHNIQUE: Multidetector CT imaging of the head and maxillofacial structures were performed using the standard protocol without intravenous contrast. Multiplanar CT image reconstructions of the maxillofacial structures were also generated. COMPARISON:  None. FINDINGS: CT HEAD FINDINGS The ventricles are normal in size and configuration. There is mild sulcal enlargement reflecting mild generalized atrophy. There are no parenchymal masses or mass effect. There is no evidence of a cortical infarct. Mild periventricular white matter hypoattenuation is noted consistent with chronic microvascular ischemic change. There are no extra-axial masses or abnormal fluid collections.  There is no intracranial hemorrhage. The single posterior right ethmoid air cell is opacified with mucosal thickening. Remaining visualized sinuses and mastoid air cells are clear. No skull fracture. CT MAXILLOFACIAL FINDINGS No fractures. Small right cheek hematoma. No other soft tissue abnormality. Single posterior right ethmoid air cell is mostly opacified with mucosal thickening. Small mucous retention cyst lies at the base of the left maxillary sinus. Sinuses otherwise clear. Clear mastoid air cells and middle ear cavities. Normal globes and orbits. IMPRESSION: HEAD CT:  No acute intracranial abnormalities.  No skull fracture. MAXILLOFACIAL CT: No fractures. Small right cheek soft tissue hematoma. Electronically Signed   By: Amie Portland M.D.   On: 05/27/2015 14:53   Dg Abd 2 Views  05/27/2015  CLINICAL DATA:  N/V after fall today; hx perforated bowel years ago per pt EXAM: ABDOMEN - 2 VIEW COMPARISON:  12/19/2014 FINDINGS: Stable foreign body in the pelvis as previously described most consistent with intrauterine device. No abnormally dilated loops of bowel. No evidence of free air. IMPRESSION: No acute findings Electronically Signed   By: Esperanza Heir M.D.   On: 05/27/2015 17:58   Ct Maxillofacial Wo Cm  05/27/2015  CLINICAL DATA:  Pt arrived via EMS with report of generalized weakness, poor appetite, n/v x3 in past 24hrs and no BM x2 weeks. Pt has been recently treated for PNA and falling. EXAM: CT HEAD WITHOUT CONTRAST CT MAXILLOFACIAL WITHOUT CONTRAST TECHNIQUE: Multidetector CT imaging of the head and maxillofacial structures were performed using the standard protocol without intravenous contrast. Multiplanar CT image reconstructions of the maxillofacial structures were also generated. COMPARISON:  None. FINDINGS: CT HEAD FINDINGS The ventricles are normal in size and configuration. There is mild sulcal enlargement reflecting mild generalized atrophy. There are no parenchymal masses or mass  effect. There is no evidence of a cortical infarct. Mild periventricular white matter hypoattenuation is noted consistent with chronic microvascular ischemic change. There are no extra-axial masses or abnormal fluid collections. There is no intracranial hemorrhage. The single posterior right ethmoid air cell is opacified with mucosal thickening. Remaining visualized sinuses and mastoid air cells are clear. No skull fracture. CT MAXILLOFACIAL FINDINGS No fractures. Small right cheek hematoma. No other soft tissue abnormality. Single posterior right ethmoid air cell is mostly opacified with mucosal thickening. Small mucous retention cyst lies at the base of the left maxillary sinus. Sinuses otherwise clear. Clear mastoid air cells and middle ear cavities. Normal globes and orbits. IMPRESSION: HEAD CT:  No acute intracranial abnormalities.  No skull  fracture. MAXILLOFACIAL CT: No fractures. Small right cheek soft tissue hematoma. Electronically Signed   By: Amie Portland M.D.   On: 05/27/2015 14:53    Microbiology: Recent Results (from the past 240 hour(s))  Culture, blood (routine x 2)     Status: None (Preliminary result)   Collection Time: 05/27/15  2:10 PM  Result Value Ref Range Status   Specimen Description BLOOD LEFT ARM  5 ML IN [facility] BOTTLE  Final   Special Requests NONE  Final   Culture   Final    NO GROWTH 3 DAYS Performed at [facility]    Report Status PENDING  Incomplete  Culture, blood (routine x 2)     Status: None (Preliminary result)   Collection Time: 05/27/15  2:55 PM  Result Value Ref Range Status   Specimen Description BLOOD RIGHT ARM  5 ML IN [facility] BOTTLE  Final   Special Requests NONE  Final   Culture   Final    NO GROWTH 3 DAYS Performed at [facility]    Report Status PENDING  Incomplete  MRSA PCR Screening     Status: None   Collection Time: 05/27/15  7:05 PM  Result Value Ref Range Status   MRSA by PCR NEGATIVE NEGATIVE Final    Comment:        The  GeneXpert MRSA Assay (FDA approved for NASAL specimens only), is one component of a comprehensive MRSA colonization surveillance program. It is not intended to diagnose MRSA infection nor to guide or monitor treatment for MRSA infections.      Labs: CBC:  Recent Labs Lab 05/27/15 1300 05/28/15 0551  WBC 7.0 7.5  NEUTROABS 5.5  --   HGB 11.9* 9.6*  HCT 32.5* 26.5*  MCV 97.0 96.0  PLT 300 233   Basic Metabolic Panel:  Recent Labs Lab 05/27/15 1701 05/28/15 0551 05/29/15 0809 05/29/15 1344 05/30/15 0505  NA 128* 132* 131* 134* 130*  K 3.1* 3.2* 3.1* 3.5 4.3  CL 90* 96* 100* 102 101  CO2 GLUCOSE 100* 107* 168* 144* 127*  BUN CREATININE 0.66 0.59 0.52 0.47 0.41*  CALCIUM 7.8* 7.7* 7.2* 7.4* 7.8*  MG 1.0*  --  1.1* 1.8 1.5*   Liver Function Tests:  Recent Labs Lab 05/27/15 1300 05/28/15 0551  AST 60* 53*  ALT 18 14  ALKPHOS 121 106  BILITOT 1.6* 1.1  PROT 6.9 5.7*  ALBUMIN 3.4* 2.8*    Recent Labs Lab 05/27/15 1300  LIPASE 18   No results for input(s): AMMONIA in the last 168 hours. Cardiac Enzymes:  Recent Labs Lab 05/27/15 1300 05/27/15 1701 05/29/15 1345  CKTOTAL  --  47  --   TROPONINI <0.03  --  <0.03   BNP (last 3 results)  Recent Labs  05/27/15 1300  BNP 206.4*   CBG: No results for input(s): GLUCAP in the last 168 hours. Time spent: 30 minutes  Signed:  Willmar Stockinger  [facility] 05/30/2015, 9:15 AM

## 2015-06-01 LAB — CULTURE, BLOOD (ROUTINE X 2)
CULTURE: NO GROWTH
CULTURE: NO GROWTH

## 2015-06-12 MED FILL — VIT D2 1.25 MG (50,000 UNIT: 1.25 MG | 28 days supply | Qty: 12 | Fill #0

## 2015-06-13 MED FILL — SULFAMETHOXAZOLE/TMP DS TAB: 800-160 | 7 days supply | Qty: 14 | Fill #0

## 2015-07-04 MED FILL — VIT D2 1.25 MG (50,000 UNIT: 1.25 MG | 84 days supply | Qty: 12 | Fill #0

## 2016-03-11 ENCOUNTER — Inpatient Hospital Stay (HOSPITAL_COMMUNITY): Payer: Medicare Other

## 2016-03-11 ENCOUNTER — Emergency Department (HOSPITAL_COMMUNITY): Payer: Medicare Other

## 2016-03-11 ENCOUNTER — Inpatient Hospital Stay (HOSPITAL_COMMUNITY)
Admission: EM | Admit: 2016-03-11 | Discharge: 2016-03-13 | DRG: 871 | Disposition: A | Discharge status: Home Health | Payer: Medicare Other | Attending: Internal Medicine | Admitting: Internal Medicine

## 2016-03-11 ENCOUNTER — Encounter (HOSPITAL_COMMUNITY): Payer: Self-pay | Admitting: Emergency Medicine

## 2016-03-11 DIAGNOSIS — J181 Lobar pneumonia, unspecified organism: Secondary | ICD-10-CM | POA: Diagnosis present

## 2016-03-11 DIAGNOSIS — Z79899 Other long term (current) drug therapy: Secondary | ICD-10-CM | POA: Diagnosis not present

## 2016-03-11 DIAGNOSIS — Z6825 Body mass index (BMI) 25.0-25.9, adult: Secondary | ICD-10-CM

## 2016-03-11 DIAGNOSIS — Z880 Allergy status to penicillin: Secondary | ICD-10-CM | POA: Diagnosis not present

## 2016-03-11 DIAGNOSIS — E878 Other disorders of electrolyte and fluid balance, not elsewhere classified: Secondary | ICD-10-CM

## 2016-03-11 DIAGNOSIS — F329 Major depressive disorder, single episode, unspecified: Secondary | ICD-10-CM | POA: Diagnosis present

## 2016-03-11 DIAGNOSIS — E860 Dehydration: Secondary | ICD-10-CM | POA: Diagnosis not present

## 2016-03-11 DIAGNOSIS — Z789 Other specified health status: Secondary | ICD-10-CM | POA: Diagnosis not present

## 2016-03-11 DIAGNOSIS — R627 Adult failure to thrive: Secondary | ICD-10-CM | POA: Diagnosis present

## 2016-03-11 DIAGNOSIS — F419 Anxiety disorder, unspecified: Secondary | ICD-10-CM | POA: Diagnosis present

## 2016-03-11 DIAGNOSIS — Z7289 Other problems related to lifestyle: Secondary | ICD-10-CM | POA: Diagnosis present

## 2016-03-11 DIAGNOSIS — D649 Anemia, unspecified: Secondary | ICD-10-CM | POA: Diagnosis present

## 2016-03-11 DIAGNOSIS — A419 Sepsis, unspecified organism: Principal | ICD-10-CM | POA: Diagnosis present

## 2016-03-11 DIAGNOSIS — R64 Cachexia: Secondary | ICD-10-CM | POA: Diagnosis present

## 2016-03-11 DIAGNOSIS — Z933 Colostomy status: Secondary | ICD-10-CM | POA: Diagnosis not present

## 2016-03-11 DIAGNOSIS — E440 Moderate protein-calorie malnutrition: Secondary | ICD-10-CM | POA: Diagnosis present

## 2016-03-11 DIAGNOSIS — K5909 Other constipation: Secondary | ICD-10-CM | POA: Diagnosis present

## 2016-03-11 DIAGNOSIS — F17210 Nicotine dependence, cigarettes, uncomplicated: Secondary | ICD-10-CM | POA: Diagnosis present

## 2016-03-11 DIAGNOSIS — G9341 Metabolic encephalopathy: Secondary | ICD-10-CM | POA: Diagnosis present

## 2016-03-11 DIAGNOSIS — Z881 Allergy status to other antibiotic agents status: Secondary | ICD-10-CM

## 2016-03-11 DIAGNOSIS — R296 Repeated falls: Secondary | ICD-10-CM | POA: Diagnosis present

## 2016-03-11 DIAGNOSIS — Z91048 Other nonmedicinal substance allergy status: Secondary | ICD-10-CM

## 2016-03-11 DIAGNOSIS — E46 Unspecified protein-calorie malnutrition: Secondary | ICD-10-CM

## 2016-03-11 DIAGNOSIS — E876 Hypokalemia: Secondary | ICD-10-CM

## 2016-03-11 DIAGNOSIS — F17200 Nicotine dependence, unspecified, uncomplicated: Secondary | ICD-10-CM | POA: Diagnosis present

## 2016-03-11 DIAGNOSIS — R740 Nonspecific elevation of levels of transaminase and lactic acid dehydrogenase [LDH]: Secondary | ICD-10-CM | POA: Diagnosis present

## 2016-03-11 DIAGNOSIS — R531 Weakness: Secondary | ICD-10-CM | POA: Diagnosis not present

## 2016-03-11 DIAGNOSIS — Z818 Family history of other mental and behavioral disorders: Secondary | ICD-10-CM | POA: Diagnosis not present

## 2016-03-11 DIAGNOSIS — E871 Hypo-osmolality and hyponatremia: Secondary | ICD-10-CM | POA: Diagnosis present

## 2016-03-11 DIAGNOSIS — Z808 Family history of malignant neoplasm of other organs or systems: Secondary | ICD-10-CM | POA: Diagnosis not present

## 2016-03-11 DIAGNOSIS — F4321 Adjustment disorder with depressed mood: Secondary | ICD-10-CM | POA: Diagnosis not present

## 2016-03-11 LAB — CBC WITH DIFFERENTIAL/PLATELET
BASOS PCT: 0 %
Basophils Absolute: 0 10*3/uL (ref 0.0–0.1)
EOS ABS: 0 10*3/uL (ref 0.0–0.7)
EOS PCT: 0 %
HEMATOCRIT: 32.2 % — AB (ref 36.0–46.0)
HEMOGLOBIN: 12.1 g/dL (ref 12.0–15.0)
LYMPHS PCT: 11 %
Lymphs Abs: 1.4 10*3/uL (ref 0.7–4.0)
MCH: 35.2 pg — AB (ref 26.0–34.0)
MCHC: 37.6 g/dL — ABNORMAL HIGH (ref 30.0–36.0)
MCV: 93.6 fL (ref 78.0–100.0)
MONOS PCT: 4 %
Monocytes Absolute: 0.5 10*3/uL (ref 0.1–1.0)
NEUTROS ABS: 11 10*3/uL — AB (ref 1.7–7.7)
NEUTROS PCT: 85 %
Platelets: 287 10*3/uL (ref 150–400)
RBC: 3.44 MIL/uL — ABNORMAL LOW (ref 3.87–5.11)
RDW: 13.2 % (ref 11.5–15.5)
WBC: 12.9 10*3/uL — ABNORMAL HIGH (ref 4.0–10.5)

## 2016-03-11 LAB — BASIC METABOLIC PANEL
ANION GAP: 9 (ref 5–15)
BUN: 7 mg/dL (ref 6–20)
CHLORIDE: 87 mmol/L — AB (ref 101–111)
CO2: 30 mmol/L (ref 22–32)
Calcium: 6.6 mg/dL — ABNORMAL LOW (ref 8.9–10.3)
Creatinine, Ser: 0.57 mg/dL (ref 0.44–1.00)
GFR calc Af Amer: >60 mL/min (ref >60)
GLUCOSE: 134 mg/dL — AB (ref 65–99)
POTASSIUM: 2.8 mmol/L — AB (ref 3.5–5.1)
Sodium: 126 mmol/L — ABNORMAL LOW (ref 135–145)

## 2016-03-11 LAB — BLOOD GAS, VENOUS
Acid-Base Excess: 11.1 mmol/L — ABNORMAL HIGH (ref 0.0–2.0)
Bicarbonate: 35.1 mmol/L — ABNORMAL HIGH (ref 20.0–28.0)
FIO2: 21
O2 Saturation: 53.7 %
PCO2 VEN: 43.6 mmHg — AB (ref 44.0–60.0)
PH VEN: 7.517 — AB (ref 7.250–7.430)
Patient temperature: 98.6

## 2016-03-11 LAB — COMPREHENSIVE METABOLIC PANEL
ALBUMIN: 2.6 g/dL — AB (ref 3.5–5.0)
ALT: 26 U/L (ref 14–54)
ANION GAP: 13 (ref 5–15)
AST: 73 U/L — AB (ref 15–41)
Alkaline Phosphatase: 179 U/L — ABNORMAL HIGH (ref 38–126)
BILIRUBIN TOTAL: 1.7 mg/dL — AB (ref 0.3–1.2)
BUN: 7 mg/dL (ref 6–20)
CHLORIDE: 73 mmol/L — AB (ref 101–111)
CO2: 34 mmol/L — AB (ref 22–32)
Calcium: 7.4 mg/dL — ABNORMAL LOW (ref 8.9–10.3)
Creatinine, Ser: 0.59 mg/dL (ref 0.44–1.00)
GFR calc Af Amer: >60 mL/min (ref >60)
GFR calc non Af Amer: >60 mL/min (ref >60)
GLUCOSE: 122 mg/dL — AB (ref 65–99)
POTASSIUM: 2.6 mmol/L — AB (ref 3.5–5.1)
SODIUM: 120 mmol/L — AB (ref 135–145)
Total Protein: 6.3 g/dL — ABNORMAL LOW (ref 6.5–8.1)

## 2016-03-11 LAB — URINALYSIS, ROUTINE W REFLEX MICROSCOPIC
Bilirubin Urine: NEGATIVE
GLUCOSE, UA: NEGATIVE mg/dL
Ketones, ur: NEGATIVE mg/dL
Nitrite: NEGATIVE
PH: 6 (ref 5.0–8.0)
PROTEIN: 30 mg/dL — AB
SPECIFIC GRAVITY, URINE: 1.013 (ref 1.005–1.030)

## 2016-03-11 LAB — CBC
HEMATOCRIT: 26.3 % — AB (ref 36.0–46.0)
HEMOGLOBIN: 9.7 g/dL — AB (ref 12.0–15.0)
MCH: 34.8 pg — ABNORMAL HIGH (ref 26.0–34.0)
MCHC: 36.9 g/dL — ABNORMAL HIGH (ref 30.0–36.0)
MCV: 94.3 fL (ref 78.0–100.0)
PLATELETS: 263 10*3/uL (ref 150–400)
RBC: 2.79 MIL/uL — AB (ref 3.87–5.11)
RDW: 13.2 % (ref 11.5–15.5)
WBC: 8.3 10*3/uL (ref 4.0–10.5)

## 2016-03-11 LAB — I-STAT CG4 LACTIC ACID, ED: Lactic Acid, Venous: 1.77 mmol/L (ref 0.5–1.9)

## 2016-03-11 LAB — MAGNESIUM: MAGNESIUM: 0.9 mg/dL — AB (ref 1.7–2.4)

## 2016-03-11 LAB — URINE MICROSCOPIC-ADD ON: Squamous Epithelial / LPF: NONE SEEN

## 2016-03-11 LAB — I-STAT TROPONIN, ED: Troponin i, poc: 0.01 ng/mL (ref 0.00–0.08)

## 2016-03-11 LAB — PROCALCITONIN: Procalcitonin: 0.35 ng/mL

## 2016-03-11 LAB — PROTIME-INR
INR: 0.99
PROTHROMBIN TIME: 13.1 s (ref 11.4–15.2)

## 2016-03-11 MED ORDER — SENNOSIDES-DOCUSATE SODIUM 8.6-50 MG PO TABS: 1.0000 | ORAL_TABLET | Freq: Every evening | ORAL | Status: DC | PRN

## 2016-03-11 MED ORDER — POTASSIUM CHLORIDE CRYS ER 20 MEQ PO TBCR
40.0000 meq | EXTENDED_RELEASE_TABLET | Freq: Once | ORAL | Status: AC
  Administered 2016-03-12: 40 meq via ORAL
  Filled 2016-03-11: qty 2

## 2016-03-11 MED ORDER — DEXTROSE 5 % IV SOLN
1.0000 g | Freq: Three times a day (TID) | INTRAVENOUS | Status: DC
  Filled 2016-03-11 (×2): qty 1

## 2016-03-11 MED ORDER — VANCOMYCIN HCL 500 MG IV SOLR
500.0000 mg | Freq: Two times a day (BID) | INTRAVENOUS | Status: DC
  Filled 2016-03-11: qty 500

## 2016-03-11 MED ORDER — ACETAMINOPHEN 650 MG RE SUPP: 650.0000 mg | Freq: Four times a day (QID) | RECTAL | Status: DC | PRN

## 2016-03-11 MED ORDER — FOLIC ACID 1 MG PO TABS
1.0000 mg | ORAL_TABLET | Freq: Once | ORAL | Status: AC
  Administered 2016-03-11: 1 mg via ORAL
  Filled 2016-03-11: qty 1

## 2016-03-11 MED ORDER — VANCOMYCIN HCL IN DEXTROSE 1-5 GM/200ML-% IV SOLN
1000.0000 mg | Freq: Once | INTRAVENOUS | Status: AC
  Administered 2016-03-11: 1000 mg via INTRAVENOUS
  Filled 2016-03-11: qty 200

## 2016-03-11 MED ORDER — THIAMINE HCL 100 MG/ML IJ SOLN
100.0000 mg | Freq: Every day | INTRAMUSCULAR | Status: DC
  Filled 2016-03-11: qty 2

## 2016-03-11 MED ORDER — ENOXAPARIN SODIUM 40 MG/0.4ML ~~LOC~~ SOLN
40.0000 mg | Freq: Every day | SUBCUTANEOUS | Status: DC
  Administered 2016-03-12 (×2): 40 mg via SUBCUTANEOUS
  Filled 2016-03-11 (×2): qty 0.4

## 2016-03-11 MED ORDER — DEXTROSE 5 % IV SOLN
2.0000 g | INTRAVENOUS | Status: AC
  Administered 2016-03-11: 2 g via INTRAVENOUS
  Filled 2016-03-11: qty 2

## 2016-03-11 MED ORDER — MAGNESIUM SULFATE 2 GM/50ML IV SOLN
2.0000 g | Freq: Once | INTRAVENOUS | Status: AC
  Administered 2016-03-11: 2 g via INTRAVENOUS
  Filled 2016-03-11: qty 50

## 2016-03-11 MED ORDER — ONDANSETRON HCL 4 MG PO TABS: 4.0000 mg | ORAL_TABLET | Freq: Four times a day (QID) | ORAL | Status: DC | PRN

## 2016-03-11 MED ORDER — POTASSIUM CHLORIDE CRYS ER 20 MEQ PO TBCR
40.0000 meq | EXTENDED_RELEASE_TABLET | Freq: Once | ORAL | Status: AC
  Administered 2016-03-11: 40 meq via ORAL
  Filled 2016-03-11: qty 2

## 2016-03-11 MED ORDER — SODIUM CHLORIDE 0.9 % IV BOLUS (SEPSIS): 250.0000 mL | Freq: Once | INTRAVENOUS | Status: DC

## 2016-03-11 MED ORDER — ONDANSETRON HCL 4 MG/2ML IJ SOLN: 4.0000 mg | Freq: Four times a day (QID) | INTRAMUSCULAR | Status: DC | PRN

## 2016-03-11 MED ORDER — POTASSIUM CHLORIDE IN NACL 40-0.9 MEQ/L-% IV SOLN
INTRAVENOUS | Status: DC
  Administered 2016-03-11 – 2016-03-12 (×2): 125 mL/h via INTRAVENOUS
  Filled 2016-03-11 (×3): qty 1000

## 2016-03-11 MED ORDER — SODIUM CHLORIDE 0.9 % IV BOLUS (SEPSIS)
1000.0000 mL | Freq: Once | INTRAVENOUS | Status: AC
  Administered 2016-03-11: 1000 mL via INTRAVENOUS

## 2016-03-11 MED ORDER — ADULT MULTIVITAMIN W/MINERALS CH
1.0000 | ORAL_TABLET | Freq: Every day | ORAL | Status: DC
  Administered 2016-03-12 – 2016-03-13 (×2): 1 via ORAL
  Filled 2016-03-11 (×2): qty 1

## 2016-03-11 MED ORDER — VITAMIN B-1 100 MG PO TABS
100.0000 mg | ORAL_TABLET | Freq: Every day | ORAL | Status: DC
  Administered 2016-03-12 – 2016-03-13 (×2): 100 mg via ORAL
  Filled 2016-03-11 (×2): qty 1

## 2016-03-11 MED ORDER — AZITHROMYCIN 250 MG PO TABS
500.0000 mg | ORAL_TABLET | Freq: Every day | ORAL | Status: DC
  Administered 2016-03-12 (×2): 500 mg via ORAL
  Filled 2016-03-11 (×2): qty 2

## 2016-03-11 MED ORDER — LORAZEPAM 1 MG PO TABS: 1.0000 mg | ORAL_TABLET | Freq: Four times a day (QID) | ORAL | Status: DC | PRN

## 2016-03-11 MED ORDER — LORAZEPAM 2 MG/ML IJ SOLN: 1.0000 mg | Freq: Four times a day (QID) | INTRAMUSCULAR | Status: DC | PRN

## 2016-03-11 MED ORDER — VITAMIN B-1 100 MG PO TABS
100.0000 mg | ORAL_TABLET | Freq: Once | ORAL | Status: AC
  Administered 2016-03-11: 100 mg via ORAL
  Filled 2016-03-11: qty 1

## 2016-03-11 MED ORDER — ACETAMINOPHEN 325 MG PO TABS: 650.0000 mg | ORAL_TABLET | Freq: Four times a day (QID) | ORAL | Status: DC | PRN

## 2016-03-11 MED ORDER — NICOTINE 21 MG/24HR TD PT24
21.0000 mg | MEDICATED_PATCH | Freq: Every day | TRANSDERMAL | Status: DC
  Filled 2016-03-11 (×2): qty 1

## 2016-03-11 MED ORDER — DEXTROSE 5 % IV SOLN
1.0000 g | Freq: Every day | INTRAVENOUS | Status: DC
  Administered 2016-03-12 (×2): 1 g via INTRAVENOUS
  Filled 2016-03-11 (×2): qty 10

## 2016-03-11 MED ORDER — FOLIC ACID 1 MG PO TABS
1.0000 mg | ORAL_TABLET | Freq: Every day | ORAL | Status: DC
  Administered 2016-03-12 – 2016-03-13 (×2): 1 mg via ORAL
  Filled 2016-03-11 (×2): qty 1

## 2016-03-11 NOTE — ED Triage Notes (Signed)
Pt sent from [facility] for dehydration, UTI, significant increase in confusion. Pt fell on Friday, found by daughter on ground on Saturday. Unable to ambulate due to increased weakness. Not eating or drinking, lost 5 pounds in 1 month. [facility]. Right knee pain. No other pain.

## 2016-03-11 NOTE — ED Notes (Signed)
Patient reminded of the need for urine. 

## 2016-03-11 NOTE — ED Notes (Signed)
Coming from [facility]-states confusion, possible UTI

## 2016-03-11 NOTE — H&P (Signed)
TRH H&P   Patient Demographics:    Rachel RamsaySusan Williams, is a 65 y.o. female  MRN: [id number]   DOB - Nov 09, 1950  Admit Date - 03/11/2016  Outpatient Primary MD for the patient is Neldon LabellaMILLER,LISA LYNN, MD  Referring MD Dr Penne LashIssacs  Outpatient Specialists:None  Patient coming from: Home  Chief Complaint  Patient presents with  . Altered Mental Status  . Hypotension      HPI:    Rachel RamsaySusan Williams  is a 65 y.o. female, With history of severe anxiety, malnutrition, hyponatremia, recurrent falls, ongoing tobacco and alcohol use who was brought to the ED by patient's daughter since she was not eating and drinking properly almost a month. As per daughter patient gets very anxious with small stresses and recently she has been very troubled with some family members coming to visit them. Patient has been increasingly fatigued and having difficulty ambulating in and around the house. Patient was having cough with some whitish phlegm for the past few days and increasingly not taking by mouth. Patient continues to smoke about a pack a day and have 3 drinks every day. Patient has been having 24-hour home care started recently. She had a fall recently and landed on her right knee. Daughter took patient to her PCP today and was found to be extremely dehydrated. She was also found to be confused. Denies head injury. Denies any fevers, chills, nausea, vomiting, headache, blurred vision, dizziness, chest pain, shortness of breath, abdominal pain, dysuria, diarrhea.  In the ED patient was hypotensive with blood pressure of 76/55 mmHg, tachycardic to low 100s, WBC of 12.9, sodium of 120, potassium of 2.6, chloride of 73, CO2 of 34, normal renal function, calcium 7.4 abdomen of 2.6 and AST of 73, alkaline phosphatase of 173. He states that showed left basilar airspace disease. Sepsis pathway initiated in the ED and patient  on 2 L IV normal saline bolus followed by empiric IV vancomycin and cefepime. Blood culture sent and hospitalist admission requested to telemetry. (Blood pressure markedly improved after first liter normal saline bolus)  Of note patient was hospitalized in February this year with severe dehydration and hyponatremia triggered by similar stresses.   Review of systems:    In addition to the HPI above, Poor by mouth intake, confusion, No Fever-chills, No Headache, No changes with Vision or hearing, No problems swallowing food or Liquids, No Chest pain, Cough or Shortness of Breath, No Abdominal pain, No Nausea or Vommiting, Bowel movements are regular, No Blood in stool or Urine, No dysuria, No new skin rashes or bruises, No new joints pains-aches,  Generalized weakness,, tingling, numbness in any extremity, No recent weight gain or loss, No polyuria, polydypsia or polyphagia, significant Mental Stressors.  A full 10 point Review of Systems was done, except as stated above, all other Review of Systems were negative.   With Past History of  the following :    Past Medical History:  Diagnosis Date  . Pneumonia       Past Surgical History:  Procedure Laterality Date  . COLON RESECTION     perforation  . COLOSTOMY        Social History:     Social History  Substance Use Topics  . Smoking status: Current Every Day Smoker    Packs/day: 1.00    Years: 50.00    Types: Cigarettes  . Smokeless tobacco: Never Used  . Alcohol use 8.4 oz/week    14 Standard drinks or equivalent per week     Comment: daily     Lives - At home alone and has 24-hour caregiver  Mobility - ambulated he at baseline    Family History :     Family History  Problem Relation Age of Onset  . Alzheimer's disease Mother   . Cancer Sister       Home Medications:   Prior to Admission medications   Medication Sig Start Date End Date Taking? Authorizing Provider  albuterol (PROVENTIL  HFA;VENTOLIN HFA) 108 (90 Base) MCG/ACT inhaler Inhale 2 puffs into the lungs every 6 (six) hours as needed for wheezing or shortness of breath. Patient not taking: Reported on 03/11/2016 05/29/15   Rolly SalterPranav M Patel, MD  bisacodyl (DULCOLAX) 10 MG suppository Place 1 suppository (10 mg total) rectally daily as needed for moderate constipation. Patient not taking: Reported on 03/11/2016 05/29/15   Rolly SalterPranav M Patel, MD  diphenhydrAMINE (BENADRYL) 25 MG tablet Take 1 tablet (25 mg total) by mouth every 6 (six) hours as needed for itching or allergies. Patient not taking: Reported on 03/11/2016 05/29/15   Rolly SalterPranav M Patel, MD  feeding supplement, ENSURE ENLIVE, (ENSURE ENLIVE) LIQD Take 237 mLs by mouth 2 (two) times daily between meals. Patient not taking: Reported on 03/11/2016 05/29/15   Rolly SalterPranav M Patel, MD  folic acid (FOLVITE) 1 MG tablet Take 1 tablet (1 mg total) by mouth daily. Patient not taking: Reported on 03/11/2016 05/29/15   Rolly SalterPranav M Patel, MD  guaiFENesin (MUCINEX) 600 MG 12 hr tablet Take 1 tablet (600 mg total) by mouth 2 (two) times daily. Patient not taking: Reported on 03/11/2016 05/29/15   Rolly SalterPranav M Patel, MD  magnesium oxide (MAG-OX) 400 (241.3 Mg) MG tablet Take 1 tablet (400 mg total) by mouth 2 (two) times daily. Patient not taking: Reported on 03/11/2016 05/29/15   Rolly SalterPranav M Patel, MD  metoprolol tartrate (LOPRESSOR) 25 MG tablet Take 1 tablet (25 mg total) by mouth 2 (two) times daily. Patient not taking: Reported on 03/11/2016 05/30/15   Rolly SalterPranav M Patel, MD  nicotine (NICODERM CQ - DOSED IN MG/24 HOURS) 21 mg/24hr patch Place 1 patch (21 mg total) onto the skin daily. Patient not taking: Reported on 03/11/2016 05/29/15   Rolly SalterPranav M Patel, MD  polyethylene glycol [facility](MIRALAX / Ethelene HalGLYCOLAX) packet Take 17 g by mouth 2 (two) times daily. Patient not taking: Reported on 03/11/2016 05/29/15   Rolly SalterPranav M Patel, MD  potassium chloride SA (K-DUR,KLOR-CON) 20 MEQ tablet Take 2 tablets (40 mEq total) by mouth  daily. Patient not taking: Reported on 03/11/2016 05/29/15   Rolly SalterPranav M Patel, MD  predniSONE (DELTASONE) 10 MG tablet Take 40mg  daily for 1days,Take 30mg  daily for 2days,Take 20mg  daily for 2days,Take 10mg  daily for 2days, then stop. Patient not taking: Reported on 03/11/2016 05/29/15   Rolly SalterPranav M Patel, MD  senna-docusate (SENOKOT-S) 8.6-50 MG tablet Take 1 tablet by mouth 2 (two)  times daily. Patient not taking: Reported on 03/11/2016 05/29/15   Rolly Salter, MD  thiamine 100 MG tablet Take 1 tablet (100 mg total) by mouth daily. Patient not taking: Reported on 03/11/2016 05/29/15   Rolly Salter, MD     Allergies:     Allergies  Allergen Reactions  . Penicillins     Has patient had a PCN reaction causing immediate rash, facial/tongue/throat swelling, SOB or lightheadedness with hypotension: Yes. Has patient had a PCN reaction causing severe rash involving mucus membranes or skin necrosis: No answer Has patient had a PCN reaction that required hospitalization No Has patient had a PCN reaction occurring within the last 10 years: No If all of the above answers are "NO", then may proceed with Cephalosporin use.    . Ciprofloxacin Rash  . Levofloxacin Rash    Rash on back after 5 days therapy.   . Nickel Rash    Anything with nickel in it.      Physical Exam:   Vitals  Blood pressure 114/90, pulse 108, temperature 98.9 F (37.2 C), temperature source Oral, resp. rate 16, height 5\' 2"  (1.575 m), weight 62.6 kg (138 lb), SpO2 100 %.   Gen.: Elderly female lying in bed not in distress   HEENT: Temporal wasting, no pallor, dry mucosa, supple neck Chest: Coarse breath sounds over left lung CVS: Normal S1 and S2, no murmurs or gallop GI: Soft, nondistended, nontender, bowel sounds present  Musculoskeletal: Warm, trace edema bilaterally, limited mobility of the right knee due to pain CNS: Alert and oriented, nonfocal   Data Review:    CBC  Recent Labs Lab 03/11/16 1535  WBC 12.9*   HGB 12.1  HCT 32.2*  PLT 287  MCV 93.6  MCH 35.2*  MCHC 37.6*  RDW 13.2  LYMPHSABS 1.4  MONOABS 0.5  EOSABS 0.0  BASOSABS 0.0   ------------------------------------------------------------------------------------------------------------------  Chemistries   Recent Labs Lab 03/11/16 1535  NA 120*  K 2.6*  CL 73*  CO2 34*  GLUCOSE 122*  BUN 7  CREATININE 0.59  CALCIUM 7.4*  AST 73*  ALT 26  ALKPHOS 179*  BILITOT 1.7*   ------------------------------------------------------------------------------------------------------------------ estimated creatinine clearance is 59.4 mL/min (by C-G formula based on SCr of 0.59 mg/dL). ------------------------------------------------------------------------------------------------------------------ No results for input(s): TSH, T4TOTAL, T3FREE, THYROIDAB in the last 72 hours.  Invalid input(s): FREET3  Coagulation profile  Recent Labs Lab 03/11/16 1535  INR 0.99   ------------------------------------------------------------------------------------------------------------------- No results for input(s): DDIMER in the last 72 hours. -------------------------------------------------------------------------------------------------------------------  Cardiac Enzymes No results for input(s): CKMB, TROPONINI, MYOGLOBIN in the last 168 hours.  Invalid input(s): CK ------------------------------------------------------------------------------------------------------------------    Component Value Date/Time   BNP 206.4 (H) 05/27/2015 1300     ---------------------------------------------------------------------------------------------------------------  Urinalysis    Component Value Date/Time   COLORURINE AMBER (A) 05/27/2015 1620   APPEARANCEUR CLEAR 05/27/2015 1620   LABSPEC 1.018 05/27/2015 1620   PHURINE 8.0 05/27/2015 1620   GLUCOSEU 100 (A) 05/27/2015 1620   HGBUR NEGATIVE 05/27/2015 1620   BILIRUBINUR SMALL (A)  05/27/2015 1620   KETONESUR 15 (A) 05/27/2015 1620   PROTEINUR NEGATIVE 05/27/2015 1620   UROBILINOGEN 1.0 01/11/2010 1611   NITRITE NEGATIVE 05/27/2015 1620   LEUKOCYTESUR NEGATIVE 05/27/2015 1620    ----------------------------------------------------------------------------------------------------------------   Imaging Results:    Dg Chest 1 View  Result Date: 03/11/2016 CLINICAL DATA:  Dehydration and malnutrition.  Dyspnea and cough. EXAM: CHEST 1 VIEW COMPARISON:  03/11/2016 at 1520 hours and 05/17/2015 CXR FINDINGS: A lateral  view of the chest was requested only. The airspace opacity at the left lung base appears to localize to the lingula. Chronic stable mild compression of a mid thoracic vertebral body possibly T7 is unchanged. No effusion or pneumothorax. IMPRESSION: Left basilar airspace disease noted earlier appears to localize to the lingula. Chronic stable mid thoracic compression fracture possibly representing the T7 vertebral body. Electronically Signed   By: Tollie Eth M.D.   On: 03/11/2016 18:03   Dg Knee 2 Views Right  Result Date: 03/11/2016 CLINICAL DATA:  Status post fall.  Right knee pain. EXAM: RIGHT KNEE - 1-2 VIEW COMPARISON:  None. FINDINGS: No evidence of fracture, dislocation, or joint effusion. Severe generalized osteopenia. No evidence of arthropathy or other focal bone abnormality. Soft tissues are unremarkable. IMPRESSION: No acute osseous injury of the right knee. Electronically Signed   By: Elige Ko   On: 03/11/2016 17:20   Dg Chest Port 1 View  Result Date: 03/11/2016 CLINICAL DATA:  Dehydration, confusion. EXAM: PORTABLE CHEST 1 VIEW COMPARISON:  05/27/2015 FINDINGS: There is a hazy airspace opacity in the left lower lung concerning for pneumonia. There is no pleural effusion or pneumothorax. The heart and mediastinal contours are unremarkable. The osseous structures are unremarkable. IMPRESSION: 1. Hazy airspace opacity in the left lower lung  concerning for pneumonia. Followup PA and lateral chest X-ray is recommended in 3-4 weeks following trial of antibiotic therapy to ensure resolution and exclude underlying malignancy. Electronically Signed   By: Elige Ko   On: 03/11/2016 15:43    My personal review of EKG: Yes tachycardia at 104, no ST-T changes   Assessment & Plan:    Principal Problem: Sepsis (HCC) Likely secondary to severe dehydration and left lobar pneumonia. Resolved with IV fluid bolus in the ED. Admit to telemetry. Received 2 L IV normal saline in the ED. Will place on IV normal saline with potassium at 1 25 mL per hour. Supplement by mouth potassium. Check magnesium. Check urine sodium and osmolality. Monitor sodium every 6 hours and avoid overcorrection.  Active Problems:  Lobar pneumonia (HCC) Place on empiric Rocephin and azithromycin. Follow blood culture, strep pneumonia antigen and urine Legionella antigen. Check HIV. Supportive care with antitussives and Tylenol.    Hyponatremia Secondary to dehydration and? Ongoing alcohol use. Continue IV hydration as above.  Acute metabolic encephalopathy Possibly due to early sepsis and dehydration/hyponatremia. Resolved with resuscitation in the ED.    Recurrent falls PT evaluation. Xray knee negative for any injury.   Hypokalemia Secondary to dehydration. Replenish with by mouth and IV fluids. Check magnesium.    Alcohol use (HCC) Drinks daily 2-3 drinks of vodka. Monitor on CIWA. Added thiamine, folate and multivitamin.     Malnutrition of moderate degree Nutrition consult.   Tobacco use Smokes one pack per day. Counseled on cessation. Order nicotine patch.   Severe anxiety and depression Not on any medications. Will consult psychiatry for assistance.   DVT Prophylaxis lovenox  AM Labs Ordered, also please review Full Orders  Family Communication: Admission, patients condition and plan of care including tests being ordered have been  discussed with the patient and her daughter at bedside  Code Status full code  Likely DC to  home  Condition GUARDED    Consults called: psychiatry  Admission status: inpt   Time spent in minutes : 70   Eddie North M.D on 03/11/2016 at 6:27 PM  Between 7am to 7pm - Pager - [phone]. After 7pm go to [email or web address] -  password [facility]  [facility] - Office  [phone]

## 2016-03-11 NOTE — ED Provider Notes (Addendum)
[facility]-EMERGENCY DEPT Provider Note   CSN: [id number] Arrival date & time: 03/11/16  1438     History   Chief Complaint Chief Complaint  Patient presents with  . Altered Mental Status  . Hypotension    HPI Rachel Williams is a 65 y.o. female.  HPI [age] old female with past medical history as below who presents with dehydration and confusion. The patient states that over the last month, she has had progressively worsening decreased by mouth intake. She has a history of malnutrition secondary to anxiety and subsequent decreased by mouth intake. Over the last several days, she has been increasingly fatigued and is having difficulty getting around the house. She went to her primary today for evaluation, which time she was noted to be hypotensive and markedly weak. She subsequent sent here for evaluation. Currently, she states that she feels tired but is otherwise "all right." Denies any chest pain, cough, shortness of breath, abdominal pain, nausea, vomiting, or dysuria. She does note increased anxiety and subsequent decreased by mouth intake. She previously had 24-hour care at home and this has recently restarted due to her weakness over the last 2 days.  Past Medical History:  Diagnosis Date  . Pneumonia     Patient Active Problem List   Diagnosis Date Noted  . Malnutrition of moderate degree 05/29/2015  . NSVT (nonsustained ventricular tachycardia) (HCC) 05/29/2015  . Hyponatremia 05/27/2015  . Recurrent falls 05/27/2015  . Facial bruising 05/27/2015  . Hypomagnesemia 05/27/2015  . Hypokalemia 05/27/2015  . Alcohol use (HCC), daily 2-3 drinks of vodka 05/27/2015  . Smoker 05/27/2015  . Dehydration 05/27/2015  . Vertebral compression fracture (HCC) 05/27/2015  . Acute pharyngitis 05/27/2015  . Chronic constipation 05/27/2015  . Generalized weakness     Past Surgical History:  Procedure Laterality Date  . COLON RESECTION     perforation  . COLOSTOMY      OB History    No  data available       Home Medications    Prior to Admission medications   Medication Sig Start Date End Date Taking? Authorizing Provider  albuterol (PROVENTIL HFA;VENTOLIN HFA) 108 (90 Base) MCG/ACT inhaler Inhale 2 puffs into the lungs every 6 (six) hours as needed for wheezing or shortness of breath. 05/29/15   Rolly SalterPranav M Patel, MD  bisacodyl (DULCOLAX) 10 MG suppository Place 1 suppository (10 mg total) rectally daily as needed for moderate constipation. 05/29/15   Rolly SalterPranav M Patel, MD  diphenhydrAMINE (BENADRYL) 25 MG tablet Take 1 tablet (25 mg total) by mouth every 6 (six) hours as needed for itching or allergies. 05/29/15   Rolly SalterPranav M Patel, MD  feeding supplement, ENSURE ENLIVE, (ENSURE ENLIVE) LIQD Take 237 mLs by mouth 2 (two) times daily between meals. 05/29/15   Rolly SalterPranav M Patel, MD  folic acid (FOLVITE) 1 MG tablet Take 1 tablet (1 mg total) by mouth daily. 05/29/15   Rolly SalterPranav M Patel, MD  guaiFENesin (MUCINEX) 600 MG 12 hr tablet Take 1 tablet (600 mg total) by mouth 2 (two) times daily. 05/29/15   Rolly SalterPranav M Patel, MD  magnesium oxide (MAG-OX) 400 (241.3 Mg) MG tablet Take 1 tablet (400 mg total) by mouth 2 (two) times daily. 05/29/15   Rolly SalterPranav M Patel, MD  metoprolol tartrate (LOPRESSOR) 25 MG tablet Take 1 tablet (25 mg total) by mouth 2 (two) times daily. 05/30/15   Rolly SalterPranav M Patel, MD  nicotine (NICODERM CQ - DOSED IN MG/24 HOURS) 21 mg/24hr patch Place 1 patch (  21 mg total) onto the skin daily. 05/29/15   Rolly Salter, MD  polyethylene glycol (MIRALAX / Ethelene Hal) packet Take 17 g by mouth 2 (two) times daily. 05/29/15   Rolly Salter, MD  potassium chloride SA (K-DUR,KLOR-CON) 20 MEQ tablet Take 2 tablets (40 mEq total) by mouth daily. 05/29/15   Rolly Salter, MD  predniSONE (DELTASONE) 10 MG tablet Take 40mg  daily for 1days,Take 30mg  daily for 2days,Take 20mg  daily for 2days,Take 10mg  daily for 2days, then stop. 05/29/15   Rolly Salter, MD  senna-docusate (SENOKOT-S) 8.6-50 MG tablet Take 1  tablet by mouth 2 (two) times daily. 05/29/15   Rolly Salter, MD  thiamine 100 MG tablet Take 1 tablet (100 mg total) by mouth daily. 05/29/15   Rolly Salter, MD    Family History Family History  Problem Relation Age of Onset  . Alzheimer's disease Mother   . Cancer Sister     Social History Social History  Substance Use Topics  . Smoking status: Current Every Day Smoker    Packs/day: 1.00    Years: 50.00    Types: Cigarettes  . Smokeless tobacco: Never Used  . Alcohol use 8.4 oz/week    14 Standard drinks or equivalent per week     Comment: daily     Allergies   Penicillins; Ciprofloxacin; Levofloxacin; and Nickel   Review of Systems Review of Systems  Constitutional: Positive for appetite change, fatigue and unexpected weight change. Negative for chills and fever.  HENT: Negative for congestion and rhinorrhea.   Eyes: Negative for visual disturbance.  Respiratory: Negative for cough, shortness of breath and wheezing.   Cardiovascular: Negative for chest pain and leg swelling.  Gastrointestinal: Negative for abdominal pain, diarrhea, nausea and vomiting.  Genitourinary: Negative for dysuria and flank pain.  Musculoskeletal: Negative for neck pain and neck stiffness.  Skin: Negative for rash and wound.  Allergic/Immunologic: Negative for immunocompromised state.  Neurological: Positive for weakness. Negative for syncope and headaches.  All other systems reviewed and are negative.    Physical Exam Updated Vital Signs BP 121/93 (BP Location: Left Arm)   Pulse 91   Temp 98.9 F (37.2 C) (Oral)   Resp 18   Ht 5\' 2"  (1.575 m)   Wt 138 lb (62.6 kg)   SpO2 98%   BMI 25.24 kg/m   Physical Exam  Constitutional: She is oriented to person, place, and time. She appears well-developed.  Malnourished, cachectic  HENT:  Head: Normocephalic and atraumatic.  Dry mucous membranes  Eyes: Conjunctivae are normal.  Neck: Neck supple.  Cardiovascular: Normal rate,  regular rhythm and normal heart sounds.  Exam reveals no friction rub.   No murmur heard. Pulmonary/Chest: Effort normal and breath sounds normal. No respiratory distress. She has no wheezes. She has no rales.  Abdominal: She exhibits no distension.  Musculoskeletal: She exhibits tenderness (Mild tenderness to palpation over right anterior knee. No deformity or swelling.). She exhibits no edema.  Neurological: She is alert and oriented to person, place, and time. She exhibits normal muscle tone.  Skin: Skin is warm. Capillary refill takes less than 2 seconds.  Psychiatric: She has a normal mood and affect.  Nursing note and vitals reviewed.    ED Treatments / Results  Labs (all labs ordered are listed, but only abnormal results are displayed) Labs Reviewed  COMPREHENSIVE METABOLIC PANEL - Abnormal; Notable for the following:       Result Value   Sodium 120 (*)  Potassium 2.6 (*)    Chloride 73 (*)    CO2 34 (*)    Glucose, Bld 122 (*)    Calcium 7.4 (*)    Total Protein 6.3 (*)    Albumin 2.6 (*)    AST 73 (*)    Alkaline Phosphatase 179 (*)    Total Bilirubin 1.7 (*)    All other components within normal limits  CBC WITH DIFFERENTIAL/PLATELET - Abnormal; Notable for the following:    WBC 12.9 (*)    RBC 3.44 (*)    HCT 32.2 (*)    MCH 35.2 (*)    MCHC 37.6 (*)    All other components within normal limits  BLOOD GAS, VENOUS - Abnormal; Notable for the following:    pH, Ven 7.517 (*)    pCO2, Ven 43.6 (*)    Bicarbonate 35.1 (*)    Acid-Base Excess 11.1 (*)    All other components within normal limits  CULTURE, BLOOD (ROUTINE X 2)  CULTURE, BLOOD (ROUTINE X 2)  URINE CULTURE  PROTIME-INR  PROCALCITONIN  URINALYSIS, ROUTINE W REFLEX MICROSCOPIC (NOT [facility])  I-STAT CG4 LACTIC ACID, ED  I-STAT CG4 LACTIC ACID, ED  Rosezena SensorI-STAT TROPOININ, ED    EKG  EKG Interpretation  Date/Time:  Monday March 11 2016 16:07:35 EST Ventricular Rate:  104 PR Interval:    QRS  Duration: 128 QT Interval:  378 QTC Calculation: 476 R Axis:   52 Text Interpretation:  Interpretation limited secondary to artifact Confirmed by Junita Kubota MD, Sheria LangAMERON [phone]([id number]) on 03/11/2016 4:55:18 PM       Radiology Dg Chest Port 1 View  Result Date: 03/11/2016 CLINICAL DATA:  Dehydration, confusion. EXAM: PORTABLE CHEST 1 VIEW COMPARISON:  05/27/2015 FINDINGS: There is a hazy airspace opacity in the left lower lung concerning for pneumonia. There is no pleural effusion or pneumothorax. The heart and mediastinal contours are unremarkable. The osseous structures are unremarkable. IMPRESSION: 1. Hazy airspace opacity in the left lower lung concerning for pneumonia. Followup PA and lateral chest X-ray is recommended in 3-4 weeks following trial of antibiotic therapy to ensure resolution and exclude underlying malignancy. Electronically Signed   By: Elige KoHetal  Patel   On: 03/11/2016 15:43    Procedures .Critical Care Performed by: Shaune PollackISAACS, Peggi Yono Authorized by: Shaune PollackISAACS, Deazia Lampi   Critical care provider statement:    Critical care time (minutes):  35   Critical care time was exclusive of:  Separately billable procedures and treating other patients   Critical care was necessary to treat or prevent imminent or life-threatening deterioration of the following conditions:  Circulatory failure, shock and sepsis   Critical care was time spent personally by me on the following activities:  Blood draw for specimens, development of treatment plan with patient or surrogate, discussions with consultants, evaluation of patient's response to treatment, examination of patient, obtaining history from patient or surrogate, ordering and performing treatments and interventions, ordering and review of laboratory studies, ordering and review of radiographic studies, pulse oximetry, re-evaluation of patient's condition and review of old charts    (including critical care time)  Medications Ordered in ED Medications    vancomycin (VANCOCIN) IVPB 1000 mg/200 mL premix (1,000 mg Intravenous New Bag/Given 03/11/16 1651)  thiamine (VITAMIN B-1) tablet 100 mg (not administered)  folic acid (FOLVITE) tablet 1 mg (not administered)  potassium chloride SA (K-DUR,KLOR-CON) CR tablet 40 mEq (not administered)  magnesium sulfate IVPB 2 g 50 mL (not administered)  ceFEPIme (MAXIPIME) 1 g in dextrose 5 %  50 mL IVPB (not administered)  vancomycin (VANCOCIN) 500 mg in sodium chloride 0.9 % 100 mL IVPB (not administered)  sodium chloride 0.9 % bolus 1,000 mL (0 mLs Intravenous Stopped 03/11/16 1653)    And  sodium chloride 0.9 % bolus 1,000 mL (0 mLs Intravenous Stopped 03/11/16 1653)  ceFEPIme (MAXIPIME) 2 g in dextrose 5 % 50 mL IVPB (0 g Intravenous Stopped 03/11/16 1653)     Initial Impression / Assessment and Plan / ED Course  I have reviewed the triage vital signs and the nursing notes.  Pertinent labs & imaging results that were available during my care of the patient were reviewed by me and considered in my medical decision making (see chart for details).  Clinical Course     65 year old female with past medical history as above here with generalized weakness, fatigue, and decreased PO intake with weight loss. On arrival, pt hypotensive, HR normal. Exam as above. Concern for severe sepsis/shock 2/2 occult infection (UTI, PNA), severe dehydration 2/2 poor PO intake with failure to thrive. No CP or signs of ACS. Will start code sepsis, fluids, and monitor.  IVF, ABX given. CXR shows PNA, which may explain etiology for her acute drop in BP, which has responded very well to IVF. Otherwise, labs show marked hyponatremia, hypokalemia, hypochloridemia c/w severe malnurition. 2L NS initially given 2/2 sepsis; will be cautious with further fluids in setting of hyponatremia, although do believe she needed the volume 2/2 severe dehydration and sepsis. Will admit to Hospitalist service.  SEPSIS NOTE: The patient is noted to  have a MAP's <65/ SBP's <90. With the current information available to me, I don't think the patient is in septic shock. The MAP's <65/ SBP's <90, is related to severe dehydration secondary to decreased PO intake. While I do feel that patient meets SIRS criteria with pneumonia on CXR, her BP has markedly responded to small boluses and I feel that 30 cc/kg FULL bolus is contra-indicated given normal lactic acid, normal renal function, improved perfusion, and severe hyponatremia on lab work, which will need to be more slowly corrected.   Final Clinical Impressions(s) / ED Diagnoses   Final diagnoses:  Dehydration  Sepsis, due to unspecified organism (HCC)  Hypokalemia  Hyponatremia  Hypochloremia      Shaune Pollack, MD 03/11/16 1658    Shaune Pollack, MD 03/11/16 [phone]

## 2016-03-11 NOTE — ED Notes (Signed)
RN/MD notified of abnormal lab 

## 2016-03-11 NOTE — Progress Notes (Signed)
Pharmacy Antibiotic Note  Rachel JewelsSusan M Weinel is a 65 y.o. female admitted on 03/11/2016 with sepsis, possible UTI.  Pharmacy has been consulted for Vancomycin and Cefepime dosing.  Noted PCN allergy noted, but EDP clarifies with patient that reaction was a mild rash.  OK to proceed with Cefepime trial.    Plan:  Cefepime 2g IV stat, then 1g IV q8h  Vancomycin 1g IV x1 then 500 mg IV q12h.  Measure Vanc trough at steady state.  Follow up renal fxn, culture results, and clinical course.  Height: 5\' 2"  (157.5 cm) Weight: 138 lb (62.6 kg) IBW/kg (Calculated) : 50.1  Temp (24hrs), Avg:98.9 F (37.2 C), Min:98.9 F (37.2 C), Max:98.9 F (37.2 C)   Recent Labs Lab 03/11/16 1535 03/11/16 1551  WBC 12.9*  --   CREATININE 0.59  --   LATICACIDVEN  --  1.77    Estimated Creatinine Clearance: 59.4 mL/min (by C-G formula based on SCr of 0.59 mg/dL).    Allergies  Allergen Reactions  . Penicillins     Has patient had a PCN reaction causing immediate rash, facial/tongue/throat swelling, SOB or lightheadedness with hypotension: Yes. Has patient had a PCN reaction causing severe rash involving mucus membranes or skin necrosis: No answer Has patient had a PCN reaction that required hospitalization No Has patient had a PCN reaction occurring within the last 10 years: No If all of the above answers are "NO", then may proceed with Cephalosporin use.    . Ciprofloxacin Rash  . Levofloxacin Rash    Rash on back after 5 days therapy.   . Nickel Rash    Anything with nickel in it.     Antimicrobials this admission: 12/4 Cefepime >>  12/4 Vancomycin >>   Dose adjustments this admission:   Microbiology results: 12/4 BCx: sent 12/4 UCx: sent   Thank you for allowing pharmacy to be a part of this patient's care.  Lynann Beaverhristine Phillips Goulette PharmD, BCPS Pager [phone] 03/11/2016 3:30 PM

## 2016-03-12 DIAGNOSIS — Z808 Family history of malignant neoplasm of other organs or systems: Secondary | ICD-10-CM

## 2016-03-12 DIAGNOSIS — Z818 Family history of other mental and behavioral disorders: Secondary | ICD-10-CM

## 2016-03-12 DIAGNOSIS — Z880 Allergy status to penicillin: Secondary | ICD-10-CM

## 2016-03-12 DIAGNOSIS — F4321 Adjustment disorder with depressed mood: Secondary | ICD-10-CM

## 2016-03-12 DIAGNOSIS — E871 Hypo-osmolality and hyponatremia: Secondary | ICD-10-CM

## 2016-03-12 DIAGNOSIS — Z79899 Other long term (current) drug therapy: Secondary | ICD-10-CM

## 2016-03-12 DIAGNOSIS — Z9109 Other allergy status, other than to drugs and biological substances: Secondary | ICD-10-CM

## 2016-03-12 DIAGNOSIS — F17210 Nicotine dependence, cigarettes, uncomplicated: Secondary | ICD-10-CM

## 2016-03-12 LAB — BASIC METABOLIC PANEL
Anion gap: 10 (ref 5–15)
Anion gap: 9 (ref 5–15)
Anion gap: 7 (ref 5–15)
BUN: 6 mg/dL (ref 6–20)
BUN: 5 mg/dL — ABNORMAL LOW (ref 6–20)
BUN: 5 mg/dL — AB (ref 6–20)
CALCIUM: 6.6 mg/dL — AB (ref 8.9–10.3)
CALCIUM: 6.3 mg/dL — AB (ref 8.9–10.3)
CO2: 26 mmol/L (ref 22–32)
CO2: 23 mmol/L (ref 22–32)
CO2: 24 mmol/L (ref 22–32)
CREATININE: 0.55 mg/dL (ref 0.44–1.00)
CREATININE: 0.59 mg/dL (ref 0.44–1.00)
Calcium: 6.8 mg/dL — ABNORMAL LOW (ref 8.9–10.3)
Chloride: 93 mmol/L — ABNORMAL LOW (ref 101–111)
Chloride: 98 mmol/L — ABNORMAL LOW (ref 101–111)
Chloride: 99 mmol/L — ABNORMAL LOW (ref 101–111)
Creatinine, Ser: 0.51 mg/dL (ref 0.44–1.00)
GFR calc Af Amer: >60 mL/min (ref >60)
GFR calc non Af Amer: >60 mL/min (ref >60)
GFR calc non Af Amer: >60 mL/min (ref >60)
GLUCOSE: 98 mg/dL (ref 65–99)
Glucose, Bld: 117 mg/dL — ABNORMAL HIGH (ref 65–99)
Glucose, Bld: 141 mg/dL — ABNORMAL HIGH (ref 65–99)
POTASSIUM: 4.6 mmol/L (ref 3.5–5.1)
Potassium: 3.9 mmol/L (ref 3.5–5.1)
Potassium: 4.2 mmol/L (ref 3.5–5.1)
SODIUM: 129 mmol/L — AB (ref 135–145)
SODIUM: 130 mmol/L — AB (ref 135–145)
Sodium: 130 mmol/L — ABNORMAL LOW (ref 135–145)

## 2016-03-12 LAB — IRON AND TIBC: IRON: 29 ug/dL (ref 28–170)

## 2016-03-12 LAB — FERRITIN: Ferritin: 648 ng/mL — ABNORMAL HIGH (ref 11–307)

## 2016-03-12 LAB — CORTISOL: Cortisol, Plasma: 13.3 ug/dL

## 2016-03-12 LAB — HIV ANTIBODY (ROUTINE TESTING W REFLEX): HIV SCREEN 4TH GENERATION: NONREACTIVE

## 2016-03-12 LAB — MRSA PCR SCREENING: MRSA by PCR: NEGATIVE

## 2016-03-12 LAB — MAGNESIUM: MAGNESIUM: 1.3 mg/dL — AB (ref 1.7–2.4)

## 2016-03-12 LAB — VITAMIN B12: Vitamin B-12: 399 pg/mL (ref 180–914)

## 2016-03-12 LAB — INFLUENZA PANEL BY PCR (TYPE A & B)
Influenza A By PCR: NEGATIVE
Influenza B By PCR: NEGATIVE

## 2016-03-12 LAB — LACTIC ACID, PLASMA: LACTIC ACID, VENOUS: 1 mmol/L (ref 0.5–1.9)

## 2016-03-12 LAB — TSH: TSH: 2.294 u[IU]/mL (ref 0.350–4.500)

## 2016-03-12 MED ORDER — MAGNESIUM SULFATE 4 GM/100ML IV SOLN
4.0000 g | Freq: Once | INTRAVENOUS | Status: AC
  Administered 2016-03-12: 4 g via INTRAVENOUS
  Filled 2016-03-12: qty 100

## 2016-03-12 MED ORDER — UNJURY CHICKEN SOUP POWDER
8.0000 [oz_av] | Freq: Three times a day (TID) | ORAL | Status: DC
  Administered 2016-03-12 – 2016-03-13 (×3): 8 [oz_av] via ORAL
  Filled 2016-03-12 (×4): qty 27

## 2016-03-12 MED ORDER — SODIUM CHLORIDE 0.9 % IV SOLN
1.0000 g | Freq: Once | INTRAVENOUS | Status: AC
  Administered 2016-03-12: 1 g via INTRAVENOUS
  Filled 2016-03-12: qty 10

## 2016-03-12 MED ORDER — SODIUM CHLORIDE 0.9 % IV SOLN
INTRAVENOUS | Status: DC
  Administered 2016-03-12 – 2016-03-13 (×2): via INTRAVENOUS

## 2016-03-12 MED ORDER — SODIUM CHLORIDE 0.9 % IV BOLUS (SEPSIS)
1000.0000 mL | Freq: Once | INTRAVENOUS | Status: AC
  Administered 2016-03-12: 1000 mL via INTRAVENOUS

## 2016-03-12 NOTE — Progress Notes (Signed)
Initial Nutrition Assessment  DOCUMENTATION CODES:   Severe malnutrition in context of acute illness/injury  INTERVENTION:   Monitor magnesium, potassium, and phosphorus daily for at least 3 days, MD to replete as needed, as pt is at risk for refeeding syndrome given severe malnutrition, poor PO intake x 6 weeks, and low Mg levels.  -Provide Unjury Chicken Soup supplements TID, each provides 100 kcal and 21g protein. -Encourage PO intake -RD to continue to monitor  NUTRITION DIAGNOSIS:   Malnutrition related to acute illness as evidenced by percent weight loss, energy intake < 75% for > 7 days.  GOAL:   Patient will meet greater than or equal to 90% of their needs  MONITOR:   PO intake, Supplement acceptance, Labs, Weight trends, I & O's, Skin  REASON FOR ASSESSMENT:   Consult Assessment of nutrition requirement/status  ASSESSMENT:   65 year old female with severe anxiety, depression, malnutrition, hyponatremia, recurrent falls, ongoing tobacco and alcohol use brought to the ED by her daughter with poor by mouth intake for past 1 month.  Patient in room with daughter at bedside. Pt reports eating well and good appetite. States this was the case PTA. Pt's daughter, however, states that the pt has been refusing to eat or drink for 6 weeks PTA. Pt would eat 1-2 bites of her meal and state she was full. Pt's daughter states this happens when the patient experiences anxiety. Explained to pt the effect of hunger on anxiety levels. Pt's daughter states they will be seeking outpatient therapy for stress and anxiety. Pt eats well at other times when her anxiety is controlled.  Pt does not like Ensure/Boost drinks d/t their sweetness. Pt is willing to try Unjury Chicken soup supplements.   Per pt's daughter, pt weighed 135 lb at admission and the pt has lost 10 lb over the past month ( 7% wt loss x 1 month ,significant for time frame). Unable to perform NFPE d/t pt's  request.   Medications: Folic acid tablet daily, IV Mg sulfate once, Multivitamin with minerals daily, Thiamine tablet daily,   Labs reviewed: Low Na, Mg  Diet Order:  Diet Heart Room service appropriate? Yes; Fluid consistency: Thin  Skin:  Wound (see comment) (MSAD on buttocks)  Last BM:  12/1  Height:   Ht Readings from Last 1 Encounters:  03/11/16 5\' 2"  (1.575 m)    Weight:   Wt Readings from Last 1 Encounters:  03/11/16 146 lb 6.2 oz (66.4 kg)    Ideal Body Weight:  50 kg  BMI:  Body mass index is 26.77 kg/m.  Estimated Nutritional Needs:   Kcal:  1700-1900  Protein:  75-85g  Fluid:  1.7-1.9L/day  EDUCATION NEEDS:   Education needs addressed  Tilda FrancoLindsey Junius Faucett, MS, RD, LDN Pager: [phone] After Hours Pager: [phone]

## 2016-03-12 NOTE — Progress Notes (Signed)
CRITICAL VALUE ALERT  Critical value received:  Calcium 6.3  Date of notification:  03/12/2016   Time of notification:  1120  Critical value read back:Yes.    Nurse who received alert:  Dolores HooseHilary Kiante Petrovich RN  MD notified (1st page):  Dr. Gonzella Lexhungel  Time of first page:  1120  MD notified (2nd page):  Time of second page:  Responding MD:  Dr. Gonzella Lexhungel  Time MD responded:  1120  MD notified. New new orders received.

## 2016-03-12 NOTE — Evaluation (Signed)
Physical Therapy Evaluation Patient Details Name: Rachel Williams MRN: [id number] DOB: 21-Jun-1950 Today's Date: 03/12/2016   History of Present Illness  Rachel Williams  is a 65 y.o. female, With history of severe anxiety, malnutrition, hyponatremia, recurrent falls, ongoing tobacco and alcohol use who was brought to the ED by patient's daughter d/t decr po intake and difficulty amb  Clinical Impression  Pt admitted with above diagnosis. Pt currently with functional limitations due to the deficits listed below (see PT Problem List).   Pt will benefit from skilled PT to increase their independence and safety with mobility to allow discharge to the venue listed below.   Pt will benefit from HHPT, dtr has arranged initial 24hr care; will continue to follow; HR 100 to 120 during gait today, O2 sats 100% on RA  Will continue to follow in acute setting; pt is requesting w/c while in hospital to move around in hallway with dtr's assist     Follow Up Recommendations Home health PT;Supervision/Assistance - 24 hour (inital 24hr-dtr has arranged)    Equipment Recommendations  None recommended by PT    Recommendations for Other Services       Precautions / Restrictions Precautions Precautions: Fall Restrictions Weight Bearing Restrictions: No      Mobility  Bed Mobility Overal bed mobility: Needs Assistance Bed Mobility: Supine to Sit     Supine to sit: Min guard     General bed mobility comments: cues to self assist and not pull up on PT, incr time to complete  Transfers Overall transfer level: Needs assistance Equipment used: Rolling walker (2 wheeled) Transfers: Sit to/from Stand Sit to Stand: Min guard;Min assist         General transfer comment: light assist to rise, steady and transition to/from RW  Ambulation/Gait Ambulation/Gait assistance: Min guard;Min assist Ambulation Distance (Feet): 80 Feet Assistive device: Rolling walker (2 wheeled) Gait Pattern/deviations:  Step-through pattern;Decreased stride length;Trunk flexed;Narrow base of support     General Gait Details: cues for posture, RW position from self; assist to maneuver RW around obstacles   Stairs            Wheelchair Mobility    Modified Rankin (Stroke Patients Only)       Balance Overall balance assessment: Needs assistance;History of Falls           Standing balance-Leahy Scale: Poor Standing balance comment: relies on UE for safe standing                             Pertinent Vitals/Pain Pain Assessment: No/denies pain    Home Living Family/patient expects to be discharged to:: Private residence Living Arrangements: Alone Available Help at Discharge: Personal care attendant;Family;Available 24 hours/day (4hrs/day 4days per wk normally--dtr has arranged 24/7 care "for now") Type of Home: House Home Access: Level entry     Home Layout: One level Home Equipment: Bedside commode;Wheelchair - Careers advisermanual;Other (comment);Walker - 4 wheels (transfer chair)      Prior Function Level of Independence: Needs assistance   Gait / Transfers Assistance Needed: amb I'ly with rollator  ADL's / Homemaking Assistance Needed: independent with bathing and dressing; aide helps with meals and housework        Hand Dominance        Extremity/Trunk Assessment   Upper Extremity Assessment: Generalized weakness           Lower Extremity Assessment: Generalized weakness;RLE deficits/detail RLE Deficits / Details: grossly WFL,  fatigues easily, very slow to move through full  AROM       Communication      Cognition Arousal/Alertness: Awake/alert Behavior During Therapy: Flat affect Overall Cognitive Status: Within Functional Limits for tasks assessed                      General Comments      Exercises     Assessment/Plan    PT Assessment Patient needs continued PT services  PT Problem List Decreased strength;Decreased balance;Decreased  activity tolerance;Decreased mobility          PT Treatment Interventions DME instruction;Gait training;Functional mobility training;Therapeutic activities;Therapeutic exercise;Patient/family education    PT Goals (Current goals can be found in the Care Plan section)  Acute Rehab PT Goals Patient Stated Goal: "to get out of here" PT Goal Formulation: With patient/family Time For Goal Achievement: 03/19/16 Potential to Achieve Goals: Good    Frequency Min 3X/week   Barriers to discharge        Co-evaluation               End of Session Equipment Utilized During Treatment: Gait belt Activity Tolerance: Patient tolerated treatment well Patient left: in chair;with call bell/phone within reach;with family/visitor present;with chair alarm set           Time: 1131-1159 PT Time Calculation (min) (ACUTE ONLY): 28 min   Charges:     PT Treatments $Gait Training: 8-22 mins   PT G Codes:        Arvle Grabe 03/12/2016, 12:21 PM

## 2016-03-12 NOTE — Progress Notes (Addendum)
PROGRESS NOTE                                                                                                                                                                                                             Patient Demographics:    Rachel RamsaySusan Williams, is a 65 y.o. female, DOB - 25-Jan-1951, ZOX:[id number]  Admit date - 03/11/2016   Admitting Physician Eddie NorthNishant Elanor Cale, MD  Outpatient Primary MD for the patient is Neldon LabellaMILLER,LISA LYNN, MD  LOS - 1  Outpatient Specialists:None  Chief Complaint  Patient presents with  . Altered Mental Status  . Hypotension       Brief Narrative  65 year old female with severe anxiety, depression, malnutrition, hyponatremia, recurrent falls, ongoing tobacco and alcohol use brought to the ED by her daughter with poor by mouth intake for past 1 month. Patient found to be in early sepsis with hypotension (systolic blood pressure in 70s), tachycardia, WBC of 12.9, hyponatremia, hypokalemia and mild transaminitis. Chest x-ray showed left basilar airspace disease. Sepsis pathway initiated and patient received 2 L IV normal saline bolus followed by IV vancomycin and cefepime. Admitted to telemetry.   Subjective:   Patient denies any symptoms today. Wanting to go home by tomorrow. Had low systolic blood pressure in the 70s, responded with 1 L normal saline bolus   Assessment  & Plan :    Principal Problem:   Sepsis (HCC) Secondary to severe dehydration, malnutrition and lobar pneumonia. Still has low-grade fever and hypotension. Continue empiric Rocephin and azithromycin. Follow cultures. Flu PCR negative.   Active Problems:  Hypotension Possibly due to dehydration. TSH and cortisol within normal limits. Orthostasis negative. Continue maintenance fluids with intermittent bolus.    Hyponatremia Secondary to dehydration.. Improving with IV fluids. Continue to monitor.  Hypokalemia and  hypomagnesemia Replenish  Hypocalcemia Corrected calcium of 7.7. Replenished low magnesium. Ordered was a calcium gluconate.    Recurrent falls Likely secondary to failure to thrive. Seen by PT recommended home health. Now has 24-hour care.  Anemia Noted for significant drop in H&H from admission. Check iron panel, B12 and folate. Check Hemoccults.    Alcohol use (HCC), daily 2-3 drinks of vodka Monitor on CIWA. Continue thiamine, folate and multivitamin.  Ongoing tobacco use Counseled on cessation. Nicotine patch.    Malnutrition of moderate degree Nutrition consult.   Anxiety  and depression Seen by psych consult. Patient refused to take any medications.      Code Status : Full code  Family Communication  : Daughter at bedside  Disposition Plan  : Home possibly in 1-2 days if sepsis is resolved  Barriers For Discharge : Active symptoms  Consults  : Psychiatry  Procedures  : None  DVT Prophylaxis  :  Lovenox -   Lab Results  Component Value Date   PLT 263 03/11/2016    Antibiotics  :    Anti-infectives    Start     Dose/Rate Route Frequency Ordered Stop   03/12/16 0600  vancomycin (VANCOCIN) 500 mg in sodium chloride 0.9 % 100 mL IVPB  Status:  Discontinued     500 mg 100 mL/hr over 60 Minutes Intravenous Every 12 hours 03/11/16 1656 03/11/16 2225   03/12/16 0000  ceFEPIme (MAXIPIME) 1 g in dextrose 5 % 50 mL IVPB  Status:  Discontinued     1 g 100 mL/hr over 30 Minutes Intravenous Every 8 hours 03/11/16 1656 03/11/16 2225   03/11/16 2300  cefTRIAXone (ROCEPHIN) 1 g in dextrose 5 % 50 mL IVPB     1 g 100 mL/hr over 30 Minutes Intravenous Daily at bedtime 03/11/16 2215 03/18/16 2159   03/11/16 2230  azithromycin (ZITHROMAX) tablet 500 mg     500 mg Oral Daily at bedtime 03/11/16 2215 03/18/16 2159   03/11/16 1545  ceFEPIme (MAXIPIME) 2 g in dextrose 5 % 50 mL IVPB     2 g 100 mL/hr over 30 Minutes Intravenous STAT 03/11/16 1536 03/11/16 1653   03/11/16  1530  vancomycin (VANCOCIN) IVPB 1000 mg/200 mL premix     1,000 mg 200 mL/hr over 60 Minutes Intravenous  Once 03/11/16 1524 03/11/16 1751        Objective:   Vitals:   03/12/16 0241 03/12/16 0525 03/12/16 0753 03/12/16 0939  BP: (!) 88/57 (!) 76/52 (!) 82/56 (!) 87/60  Pulse: 84 98 (!) 108   Resp:  18    Temp:  99.7 F (37.6 C)    TempSrc:  Oral    SpO2:  93%    Weight:      Height:        Wt Readings from Last 3 Encounters:  03/11/16 66.4 kg (146 lb 6.2 oz)  05/30/15 72.5 kg (159 lb 13.3 oz)     Intake/Output Summary (Last 24 hours) at 03/12/16 1355 Last data filed at 03/12/16 0600  Gross per 24 hour  Intake          3907.17 ml  Output                0 ml  Net          3907.17 ml     Physical Exam  Gen: not in distress HEENT: Temporal wasting, no pallor, moist mucosa, supple neck Chest: clear b/l, no added sounds CVS: N S1&S2, no murmurs, rubs or gallop GI: soft, NT, ND, BS+ Musculoskeletal: warm, trace edema CNS: Alert and oriented    Data Review:    CBC  Recent Labs Lab 03/11/16 1535 03/11/16 2239  WBC 12.9* 8.3  HGB 12.1 9.7*  HCT 32.2* 26.3*  PLT 287 263  MCV 93.6 94.3  MCH 35.2* 34.8*  MCHC 37.6* 36.9*  RDW 13.2 13.2  LYMPHSABS 1.4  --   MONOABS 0.5  --   EOSABS 0.0  --   BASOSABS 0.0  --  Chemistries   Recent Labs Lab 03/11/16 1535 03/11/16 1539 03/11/16 2239 03/12/16 0419 03/12/16 1034  NA 120*  --  126* 129* 130*  K 2.6*  --  2.8* 3.9 4.2  CL 73*  --  87* 93* 98*  CO2 34*  --  30 26 23   GLUCOSE 122*  --  134* 117* 141*  BUN 7  --  7 6 5*  CREATININE 0.59  --  0.57 0.55 0.59  CALCIUM 7.4*  --  6.6* 6.6* 6.3*  MG  --  0.9*  --   --  1.3*  AST 73*  --   --   --   --   ALT 26  --   --   --   --   ALKPHOS 179*  --   --   --   --   BILITOT 1.7*  --   --   --   --    ------------------------------------------------------------------------------------------------------------------ No results for input(s): CHOL, HDL,  LDLCALC, TRIG, CHOLHDL, LDLDIRECT in the last 72 hours.  No results found for: HGBA1C ------------------------------------------------------------------------------------------------------------------  Recent Labs  03/12/16 0419  TSH 2.294   ------------------------------------------------------------------------------------------------------------------ No results for input(s): VITAMINB12, FOLATE, FERRITIN, TIBC, IRON, RETICCTPCT in the last 72 hours.  Coagulation profile  Recent Labs Lab 03/11/16 1535  INR 0.99    No results for input(s): DDIMER in the last 72 hours.  Cardiac Enzymes No results for input(s): CKMB, TROPONINI, MYOGLOBIN in the last 168 hours.  Invalid input(s): CK ------------------------------------------------------------------------------------------------------------------    Component Value Date/Time   BNP 206.4 (H) 05/27/2015 1300    Inpatient Medications  Scheduled Meds: . azithromycin  500 mg Oral QHS  . cefTRIAXone (ROCEPHIN)  IV  1 g Intravenous QHS  . enoxaparin (LOVENOX) injection  40 mg Subcutaneous QHS  . folic acid  1 mg Oral Daily  . multivitamin with minerals  1 tablet Oral Daily  . nicotine  21 mg Transdermal Daily  . thiamine  100 mg Oral Daily   Or  . thiamine  100 mg Intravenous Daily   Continuous Infusions: . 0.9 % NaCl with KCl 40 mEq / L 125 mL/hr (03/12/16 0750)   PRN Meds:.acetaminophen **OR** acetaminophen, LORazepam **OR** LORazepam, ondansetron **OR** ondansetron (ZOFRAN) IV, senna-docusate  Micro Results Recent Results (from the past 240 hour(s))  MRSA PCR Screening     Status: None   Collection Time: 03/12/16  2:58 AM  Result Value Ref Range Status   MRSA by PCR NEGATIVE NEGATIVE Final    Comment:        The GeneXpert MRSA Assay (FDA approved for NASAL specimens only), is one component of a comprehensive MRSA colonization surveillance program. It is not intended to diagnose MRSA infection nor to guide  or monitor treatment for MRSA infections.     Radiology Reports Dg Chest 1 View  Result Date: 03/11/2016 CLINICAL DATA:  Dehydration and malnutrition.  Dyspnea and cough. EXAM: CHEST 1 VIEW COMPARISON:  03/11/2016 at 1520 hours and 05/17/2015 CXR FINDINGS: A lateral view of the chest was requested only. The airspace opacity at the left lung base appears to localize to the lingula. Chronic stable mild compression of a mid thoracic vertebral body possibly T7 is unchanged. No effusion or pneumothorax. IMPRESSION: Left basilar airspace disease noted earlier appears to localize to the lingula. Chronic stable mid thoracic compression fracture possibly representing the T7 vertebral body. Electronically Signed   By: Tollie Ethavid  Kwon M.D.   On: 03/11/2016 18:03   Dg Knee  2 Views Right  Result Date: 03/11/2016 CLINICAL DATA:  Status post fall.  Right knee pain. EXAM: RIGHT KNEE - 1-2 VIEW COMPARISON:  None. FINDINGS: No evidence of fracture, dislocation, or joint effusion. Severe generalized osteopenia. No evidence of arthropathy or other focal bone abnormality. Soft tissues are unremarkable. IMPRESSION: No acute osseous injury of the right knee. Electronically Signed   By: Elige Ko   On: 03/11/2016 17:20   Dg Chest Port 1 View  Result Date: 03/11/2016 CLINICAL DATA:  Dehydration, confusion. EXAM: PORTABLE CHEST 1 VIEW COMPARISON:  05/27/2015 FINDINGS: There is a hazy airspace opacity in the left lower lung concerning for pneumonia. There is no pleural effusion or pneumothorax. The heart and mediastinal contours are unremarkable. The osseous structures are unremarkable. IMPRESSION: 1. Hazy airspace opacity in the left lower lung concerning for pneumonia. Followup PA and lateral chest X-ray is recommended in 3-4 weeks following trial of antibiotic therapy to ensure resolution and exclude underlying malignancy. Electronically Signed   By: Elige Ko   On: 03/11/2016 15:43    Time Spent in minutes   25   Eddie North M.D on 03/12/2016 at 1:55 PM  Between 7am to 7pm - Pager - [phone]  After 7pm go to [email or web address] - password [facility]  [facility] -  Office  [phone]

## 2016-03-12 NOTE — Consult Note (Signed)
[facility] Face-to-Face Psychiatry Consult   Reason for Consult:  Severe anxiety Referring Physician:  Dr. Clementeen Graham Patient Identification: Rachel Williams MRN:  [id number] Principal Diagnosis: Sepsis [facility]) Diagnosis:   Patient Active Problem List   Diagnosis Date Noted  . Sepsis ([place]) [A41.9] 03/11/2016  . SIRS (systemic inflammatory response syndrome) (H[facility]) [R65.10] 03/11/2016  . Malnutrition of moderate degree [E44.0] 05/29/2015  . NSVT (nonsustained ventricular tachycardia) ([place]) [I47.2] 05/29/2015  . Hyponatremia [E87.1] 05/27/2015  . Recurrent falls [R29.6] 05/27/2015  . Facial bruising [S00.83XA] 05/27/2015  . Hypomagnesemia [E83.42] 05/27/2015  . Hypokalemia [E87.6] 05/27/2015  . Alcohol use ([place]), daily 2-3 drinks of vodka [Z78.9] 05/27/2015  . Smoker [F17.200] 05/27/2015  . Dehydration [E86.0] 05/27/2015  . Vertebral compression fracture ([place]) [M48.50XA] 05/27/2015  . Acute pharyngitis [J02.9] 05/27/2015  . Chronic constipation [K59.09] 05/27/2015  . Generalized weakness [R53.1]     Total Time spent with patient: 45 minutes  Subjective:   Rachel Williams is a 65 y.o. female patient admitted with severe anxiety.  HPI:   Rachel Williams  is a 65 y.o. female, with history of severe anxiety, malnutrition, hyponatremia, recurrent falls, ongoing tobacco and alcohol use who was brought to the ED by patient's daughter since she was not eating and drinking properly almost a month. Patient stated that her husband died almost a year ago and she continued to be grieving which resulted feeling depressed, isolated, withdrawn, not socializing and not eating or drinking much which resulted related to dehydration at the time of admission. Patient denies active suicidal/homicidal ideation, intention or plans. Patient has no evidence of psychosis. Patient is not open to medication management but is willing to participate in outpatient counseling services upon discharge from the hospital. Patient daughter is  willing to support her and also asking her to consider medication treatment but she has been very tough and not flexible regarding medication management.  Past Psychiatric History: Alcohol abuse and nicotine abuse by history. She does also have a extended grief without counseling services.  Risk to Self: Is patient at risk for suicide?: No Risk to Others:   Prior Inpatient Therapy:   Prior Outpatient Therapy:    Past Medical History:  Past Medical History:  Diagnosis Date  . Pneumonia     Past Surgical History:  Procedure Laterality Date  . COLON RESECTION     perforation  . COLOSTOMY     Family History:  Family History  Problem Relation Age of Onset  . Alzheimer's disease Mother   . Cancer Sister    Family Psychiatric  History: Noncontributory Social History:  History  Alcohol Use  . 8.4 oz/week  . 14 Standard drinks or equivalent per week    Comment: daily     History  Drug Use No    Social History   Social History  . Marital status: Married    Spouse name: N/A  . Number of children: N/A  . Years of education: N/A   Social History Main Topics  . Smoking status: Current Every Day Smoker    Packs/day: 1.00    Years: 50.00    Types: Cigarettes  . Smokeless tobacco: Never Used  . Alcohol use 8.4 oz/week    14 Standard drinks or equivalent per week     Comment: daily  . Drug use: No  . Sexual activity: Not Asked   Other Topics Concern  . None   Social History Narrative  . None   Additional Social History:  Allergies:   Allergies  Allergen Reactions  . Penicillins     Has patient had a PCN reaction causing immediate rash, facial/tongue/throat swelling, SOB or lightheadedness with hypotension: Yes. Has patient had a PCN reaction causing severe rash involving mucus membranes or skin necrosis: No answer Has patient had a PCN reaction that required hospitalization No Has patient had a PCN reaction occurring within the last 10 years: No If all of the  above answers are "NO", then may proceed with Cephalosporin use.    . Ciprofloxacin Rash  . Levofloxacin Rash    Rash on back after 5 days therapy.   . Nickel Rash    Anything with nickel in it.     Labs:  Results for orders placed or performed during the hospital encounter of 03/11/16 (from the past 48 hour(s))  Blood gas, venous ([facility], [facility], [facility])     Status: Abnormal   Collection Time: 03/11/16  1:15 PM  Result Value Ref Range   FIO2 21.00    Delivery systems ROOM AIR    pH, Ven 7.517 (H) 7.250 - 7.430   pCO2, Ven 43.6 (L) 44.0 - 60.0 mmHg   pO2, Ven BELOW REPORTABLE RANGE 32.0 - 45.0 mmHg    Comment: RBV CAMERON ISAACS,MD AT 1630 BY KENNAN DEPUE,RRT,RCP ON 03/11/2016   Bicarbonate 35.1 (H) 20.0 - 28.0 mmol/L   Acid-Base Excess 11.1 (H) 0.0 - 2.0 mmol/L   O2 Saturation 53.7 %   Patient temperature 98.6    Collection site VEIN    Drawn by DRAWN BY RN    Sample type VEIN   Urinalysis, Routine w reflex microscopic (not at [facility])     Status: Abnormal   Collection Time: 03/11/16  3:00 PM  Result Value Ref Range   Color, Urine AMBER (A) YELLOW    Comment: BIOCHEMICALS MAY BE AFFECTED BY COLOR   APPearance TURBID (A) CLEAR   Specific Gravity, Urine 1.013 1.005 - 1.030   pH 6.0 5.0 - 8.0   Glucose, UA NEGATIVE NEGATIVE mg/dL   Hgb urine dipstick LARGE (A) NEGATIVE   Bilirubin Urine NEGATIVE NEGATIVE   Ketones, ur NEGATIVE NEGATIVE mg/dL   Protein, ur 30 (A) NEGATIVE mg/dL   Nitrite NEGATIVE NEGATIVE   Leukocytes, UA LARGE (A) NEGATIVE  Urine microscopic-add on     Status: Abnormal   Collection Time: 03/11/16  3:00 PM  Result Value Ref Range   Squamous Epithelial / LPF NONE SEEN NONE SEEN   WBC, UA TOO NUMEROUS TO COUNT 0 - 5 WBC/hpf   RBC / HPF 6-30 0 - 5 RBC/hpf   Bacteria, UA MANY (A) NONE SEEN  Comprehensive metabolic panel     Status: Abnormal   Collection Time: 03/11/16  3:35 PM  Result Value Ref Range   Sodium 120 (L) 135 - 145 mmol/L   Potassium 2.6 (LL) 3.5 - 5.1  mmol/L    Comment: CRITICAL RESULT CALLED TO, READ BACK BY AND VERIFIED WITH: WEST,S @ 1643 ON [id number] BY POTEAT,S    Chloride 73 (L) 101 - 111 mmol/L   CO2 34 (H) 22 - 32 mmol/L   Glucose, Bld 122 (H) 65 - 99 mg/dL   BUN 7 6 - 20 mg/dL   Creatinine, Ser 0.59 0.44 - 1.00 mg/dL   Calcium 7.4 (L) 8.9 - 10.3 mg/dL   Total Protein 6.3 (L) 6.5 - 8.1 g/dL   Albumin 2.6 (L) 3.5 - 5.0 g/dL   AST 73 (H) 15 - 41 U/L  ALT 26 14 - 54 U/L   Alkaline Phosphatase 179 (H) 38 - 126 U/L   Total Bilirubin 1.7 (H) 0.3 - 1.2 mg/dL   GFR calc non Af Amer >60 >60 mL/min   GFR calc Af Amer >60 >60 mL/min    Comment: (NOTE) The eGFR has been calculated using the CKD EPI equation. This calculation has not been validated in all clinical situations. eGFR's persistently <60 mL/min signify possible Chronic Kidney Disease.    Anion gap 13 5 - 15  CBC with Differential     Status: Abnormal   Collection Time: 03/11/16  3:35 PM  Result Value Ref Range   WBC 12.9 (H) 4.0 - 10.5 K/uL   RBC 3.44 (L) 3.87 - 5.11 MIL/uL   Hemoglobin 12.1 12.0 - 15.0 g/dL   HCT 32.2 (L) 36.0 - 46.0 %   MCV 93.6 78.0 - 100.0 fL   MCH 35.2 (H) 26.0 - 34.0 pg   MCHC 37.6 (H) 30.0 - 36.0 g/dL    Comment: RULED OUT INTERFERING SUBSTANCES   RDW 13.2 11.5 - 15.5 %   Platelets 287 150 - 400 K/uL   Neutrophils Relative % 85 %   Lymphocytes Relative 11 %   Monocytes Relative 4 %   Eosinophils Relative 0 %   Basophils Relative 0 %   Neutro Abs 11.0 (H) 1.7 - 7.7 K/uL   Lymphs Abs 1.4 0.7 - 4.0 K/uL   Monocytes Absolute 0.5 0.1 - 1.0 K/uL   Eosinophils Absolute 0.0 0.0 - 0.7 K/uL   Basophils Absolute 0.0 0.0 - 0.1 K/uL   Smear Review MORPHOLOGY UNREMARKABLE   Protime-INR     Status: None   Collection Time: 03/11/16  3:35 PM  Result Value Ref Range   Prothrombin Time 13.1 11.4 - 15.2 seconds   INR 0.99   Procalcitonin     Status: None   Collection Time: 03/11/16  3:35 PM  Result Value Ref Range   Procalcitonin 0.35 ng/mL     Comment:        Interpretation: PCT (Procalcitonin) <= 0.5 ng/mL: Systemic infection (sepsis) is not likely. Local bacterial infection is possible. (NOTE)         ICU PCT Algorithm               Non ICU PCT Algorithm    ----------------------------     ------------------------------         PCT < 0.25 ng/mL                 PCT < 0.1 ng/mL     Stopping of antibiotics            Stopping of antibiotics       strongly encouraged.               strongly encouraged.    ----------------------------     ------------------------------       PCT level decrease by               PCT < 0.25 ng/mL       >= 80% from peak PCT       OR PCT 0.25 - 0.5 ng/mL          Stopping of antibiotics  encouraged.     Stopping of antibiotics           encouraged.    ----------------------------     ------------------------------       PCT level decrease by              PCT >= 0.25 ng/mL       < 80% from peak PCT        AND PCT >= 0.5 ng/mL            Continuin g antibiotics                                              encouraged.       Continuing antibiotics            encouraged.    ----------------------------     ------------------------------     PCT level increase compared          PCT > 0.5 ng/mL         with peak PCT AND          PCT >= 0.5 ng/mL             Escalation of antibiotics                                          strongly encouraged.      Escalation of antibiotics        strongly encouraged.   Magnesium     Status: Abnormal   Collection Time: 03/11/16  3:39 PM  Result Value Ref Range   Magnesium 0.9 (LL) 1.7 - 2.4 mg/dL    Comment: CRITICAL RESULT CALLED TO, READ BACK BY AND VERIFIED WITH: E.COGGIN RN AT 1954 ON 03/11/16 BY S.VANHOORNE   I-stat troponin, ED (not at [facility], [facility])     Status: None   Collection Time: 03/11/16  3:48 PM  Result Value Ref Range   Troponin i, poc 0.01 0.00 - 0.08 ng/mL   Comment 3            Comment: Due to the  release kinetics of cTnI, a negative result within the first hours of the onset of symptoms does not rule out myocardial infarction with certainty. If myocardial infarction is still suspected, repeat the test at appropriate intervals.   I-Stat CG4 Lactic Acid, ED     Status: None   Collection Time: 03/11/16  3:51 PM  Result Value Ref Range   Lactic Acid, Venous 1.77 0.5 - 1.9 mmol/L  CBC     Status: Abnormal   Collection Time: 03/11/16 10:39 PM  Result Value Ref Range   WBC 8.3 4.0 - 10.5 K/uL   RBC 2.79 (L) 3.87 - 5.11 MIL/uL   Hemoglobin 9.7 (L) 12.0 - 15.0 g/dL   HCT 26.3 (L) 36.0 - 46.0 %   MCV 94.3 78.0 - 100.0 fL   MCH 34.8 (H) 26.0 - 34.0 pg   MCHC 36.9 (H) 30.0 - 36.0 g/dL   RDW 13.2 11.5 - 15.5 %   Platelets 263 150 - 400 K/uL  Basic metabolic panel     Status: Abnormal   Collection Time: 03/11/16 10:39 PM  Result Value Ref Range   Sodium 126 (L) 135 - 145 mmol/L  Potassium 2.8 (L) 3.5 - 5.1 mmol/L   Chloride 87 (L) 101 - 111 mmol/L   CO2 30 22 - 32 mmol/L   Glucose, Bld 134 (H) 65 - 99 mg/dL   BUN 7 6 - 20 mg/dL   Creatinine, Ser 0.57 0.44 - 1.00 mg/dL   Calcium 6.6 (L) 8.9 - 10.3 mg/dL   GFR calc non Af Amer >60 >60 mL/min   GFR calc Af Amer >60 >60 mL/min    Comment: (NOTE) The eGFR has been calculated using the CKD EPI equation. This calculation has not been validated in all clinical situations. eGFR's persistently <60 mL/min signify possible Chronic Kidney Disease.    Anion gap 9 5 - 15  MRSA PCR Screening     Status: None   Collection Time: 03/12/16  2:58 AM  Result Value Ref Range   MRSA by PCR NEGATIVE NEGATIVE    Comment:        The GeneXpert MRSA Assay (FDA approved for NASAL specimens only), is one component of a comprehensive MRSA colonization surveillance program. It is not intended to diagnose MRSA infection nor to guide or monitor treatment for MRSA infections.   Influenza panel by PCR (type A & B, H1N1)     Status: None   Collection  Time: 03/12/16  2:59 AM  Result Value Ref Range   Influenza A By PCR NEGATIVE NEGATIVE   Influenza B By PCR NEGATIVE NEGATIVE    Comment: (NOTE) The Xpert Xpress Flu assay is intended as an aid in the diagnosis of  influenza and should not be used as a sole basis for treatment.  This  assay is FDA approved for nasopharyngeal swab specimens only. Nasal  washings and aspirates are unacceptable for Xpert Xpress Flu testing.   Basic metabolic panel     Status: Abnormal   Collection Time: 03/12/16  4:19 AM  Result Value Ref Range   Sodium 129 (L) 135 - 145 mmol/L   Potassium 3.9 3.5 - 5.1 mmol/L    Comment: DELTA CHECK NOTED REPEATED TO VERIFY NO VISIBLE HEMOLYSIS    Chloride 93 (L) 101 - 111 mmol/L   CO2 26 22 - 32 mmol/L   Glucose, Bld 117 (H) 65 - 99 mg/dL   BUN 6 6 - 20 mg/dL   Creatinine, Ser 0.55 0.44 - 1.00 mg/dL   Calcium 6.6 (L) 8.9 - 10.3 mg/dL   GFR calc non Af Amer >60 >60 mL/min   GFR calc Af Amer >60 >60 mL/min    Comment: (NOTE) The eGFR has been calculated using the CKD EPI equation. This calculation has not been validated in all clinical situations. eGFR's persistently <60 mL/min signify possible Chronic Kidney Disease.    Anion gap 10 5 - 15  TSH     Status: None   Collection Time: 03/12/16  4:19 AM  Result Value Ref Range   TSH 2.294 0.350 - 4.500 uIU/mL    Comment: Performed by a 3rd Generation assay with a functional sensitivity of <=0.01 uIU/mL.  Cortisol     Status: None   Collection Time: 03/12/16  4:19 AM  Result Value Ref Range   Cortisol, Plasma 13.3 ug/dL    Comment: (NOTE) AM    6.7 - 22.6 ug/dL PM   <10.0       ug/dL Performed at [facility]   Lactic acid, plasma     Status: None   Collection Time: 03/12/16  6:39 AM  Result Value Ref Range  Lactic Acid, Venous 1.0 0.5 - 1.9 mmol/L  Basic metabolic panel     Status: Abnormal   Collection Time: 03/12/16 10:34 AM  Result Value Ref Range   Sodium 130 (L) 135 - 145 mmol/L    Potassium 4.2 3.5 - 5.1 mmol/L   Chloride 98 (L) 101 - 111 mmol/L   CO2 23 22 - 32 mmol/L   Glucose, Bld 141 (H) 65 - 99 mg/dL   BUN 5 (L) 6 - 20 mg/dL   Creatinine, Ser 0.59 0.44 - 1.00 mg/dL   Calcium 6.3 (LL) 8.9 - 10.3 mg/dL    Comment: CRITICAL RESULT CALLED TO, READ BACK BY AND VERIFIED WITH: HOLDERNESS,H. RN AT 1116 03/12/16 MULLINS,T    GFR calc non Af Amer >60 >60 mL/min   GFR calc Af Amer >60 >60 mL/min    Comment: (NOTE) The eGFR has been calculated using the CKD EPI equation. This calculation has not been validated in all clinical situations. eGFR's persistently <60 mL/min signify possible Chronic Kidney Disease.    Anion gap 9 5 - 15  Magnesium     Status: Abnormal   Collection Time: 03/12/16 10:34 AM  Result Value Ref Range   Magnesium 1.3 (L) 1.7 - 2.4 mg/dL    Current Facility-Administered Medications  Medication Dose Route Frequency Provider Last Rate Last Dose  . 0.9 % NaCl with KCl 40 mEq / L  infusion   Intravenous Continuous Nishant Dhungel, MD 125 mL/hr at 03/12/16 0750 125 mL/hr at 03/12/16 0750  . acetaminophen (TYLENOL) tablet 650 mg  650 mg Oral Q6H PRN Nishant Dhungel, MD       Or  . acetaminophen (TYLENOL) suppository 650 mg  650 mg Rectal Q6H PRN Nishant Dhungel, MD      . azithromycin (ZITHROMAX) tablet 500 mg  500 mg Oral QHS Nishant Dhungel, MD   500 mg at 03/12/16 0120  . cefTRIAXone (ROCEPHIN) 1 g in dextrose 5 % 50 mL IVPB  1 g Intravenous QHS Nishant Dhungel, MD   1 g at 03/12/16 0120  . enoxaparin (LOVENOX) injection 40 mg  40 mg Subcutaneous QHS Nishant Dhungel, MD   40 mg at 03/12/16 0128  . folic acid (FOLVITE) tablet 1 mg  1 mg Oral Daily Nishant Dhungel, MD   1 mg at 03/12/16 1033  . LORazepam (ATIVAN) tablet 1 mg  1 mg Oral Q6H PRN Louellen Molder, MD       Or  . LORazepam (ATIVAN) injection 1 mg  1 mg Intravenous Q6H PRN Nishant Dhungel, MD      . multivitamin with minerals tablet 1 tablet  1 tablet Oral Daily Nishant Dhungel, MD   1  tablet at 03/12/16 1032  . nicotine (NICODERM CQ - dosed in mg/24 hours) patch 21 mg  21 mg Transdermal Daily Nishant Dhungel, MD      . ondansetron (ZOFRAN) tablet 4 mg  4 mg Oral Q6H PRN Nishant Dhungel, MD       Or  . ondansetron (ZOFRAN) injection 4 mg  4 mg Intravenous Q6H PRN Nishant Dhungel, MD      . senna-docusate (Senokot-S) tablet 1 tablet  1 tablet Oral QHS PRN Nishant Dhungel, MD      . thiamine (VITAMIN B-1) tablet 100 mg  100 mg Oral Daily Nishant Dhungel, MD   100 mg at 03/12/16 1032   Or  . thiamine (B-1) injection 100 mg  100 mg Intravenous Daily Nishant Dhungel, MD  Musculoskeletal: Strength & Muscle Tone: within normal limits Gait & Station: Patient sat in a chair next to her bed Patient leans: N/A  Psychiatric Specialty Exam: Physical Exam as per history and physical   ROS generalized, weakness, tired and grief which is extended from the loss of spouse about a year ago   Blood pressure (!) 87/60, pulse (!) 108, temperature 99.7 F (37.6 C), temperature source Oral, resp. rate 18, height '5\' 2"'  (1.575 m), weight 66.4 kg (146 lb 6.2 oz), SpO2 93 %.Body mass index is 26.77 kg/m.  General Appearance: Casual  Eye Contact:  Good  Speech:  Clear and Coherent  Volume:  Normal  Mood:  Depressed  Affect:  Appropriate and Congruent  Thought Process:  Coherent and Goal Directed  Orientation:  Full (Time, Place, and Person)  Thought Content:  WDL  Suicidal Thoughts:  No  Homicidal Thoughts:  No  Memory:  Immediate;   Good Recent;   Fair Remote;   Fair  Judgement:  Intact  Insight:  Good  Psychomotor Activity:  Decreased  Concentration:  Concentration: Good and Attention Span: Good  Recall:  Good  Fund of Knowledge:  Good  Language:  Good  Akathisia:  Yes  Handed:  Right  AIMS (if indicated):     Assets:  Communication Skills Desire for Improvement Financial Resources/Insurance Housing Leisure Time Resilience Social Support Transportation  ADL's:   Intact  Cognition:  WNL  Sleep:            Treatment Plan Summary: Patient has been suffering with the excessive anxiety and grief since her mother passed away in 2015-05-22.  Patient contract for safety and has denied active suicidal/homicidal ideation, intention or plans. Patient benefit from Remeron 15 mg daily at bedtime but patient to refused to consent for the medication management Patient will be referred to the grief counseling and medically stable. Daily contact with patient to assess and evaluate symptoms and progress in treatment and Medication management  Patient does not meet criteria for involuntary commitment. Appreciate psychiatric consultation and we sign off as of today Please contact [phone] or [phone] if needs further assistance   Disposition: No evidence of imminent risk to self or others at present.   Patient does not meet criteria for psychiatric inpatient admission. Supportive therapy provided about ongoing stressors.  Ambrose Finland, MD 03/12/2016 12:04 PM

## 2016-03-13 DIAGNOSIS — K5909 Other constipation: Secondary | ICD-10-CM

## 2016-03-13 DIAGNOSIS — R531 Weakness: Secondary | ICD-10-CM

## 2016-03-13 DIAGNOSIS — E860 Dehydration: Secondary | ICD-10-CM

## 2016-03-13 DIAGNOSIS — Z789 Other specified health status: Secondary | ICD-10-CM

## 2016-03-13 LAB — HEMATOLOGY COMMENTS:

## 2016-03-13 LAB — URINE CULTURE: CULTURE: NO GROWTH

## 2016-03-13 LAB — BASIC METABOLIC PANEL
Anion gap: 5 (ref 5–15)
CHLORIDE: 100 mmol/L — AB (ref 101–111)
CO2: 26 mmol/L (ref 22–32)
CREATININE: 0.49 mg/dL (ref 0.44–1.00)
Calcium: 6.7 mg/dL — ABNORMAL LOW (ref 8.9–10.3)
GFR calc Af Amer: >60 mL/min (ref >60)
GFR calc non Af Amer: >60 mL/min (ref >60)
GLUCOSE: 104 mg/dL — AB (ref 65–99)
POTASSIUM: 4 mmol/L (ref 3.5–5.1)
SODIUM: 131 mmol/L — AB (ref 135–145)

## 2016-03-13 LAB — FOLATE RBC
FOLATE, HEMOLYSATE: 285.7 ng/mL
FOLATE, RBC: 1074 ng/mL (ref >498)
Hematocrit: 26.6 % — ABNORMAL LOW (ref 34.0–46.6)

## 2016-03-13 LAB — MAGNESIUM: MAGNESIUM: 1.8 mg/dL (ref 1.7–2.4)

## 2016-03-13 MED ORDER — AZITHROMYCIN 250 MG PO TABS: ORAL_TABLET | ORAL | 0 refills | Status: DC

## 2016-03-13 MED ORDER — CEFUROXIME AXETIL 500 MG PO TABS: 500.0000 mg | ORAL_TABLET | Freq: Two times a day (BID) | ORAL | 0 refills | Status: DC

## 2016-03-13 MED ORDER — CEFUROXIME AXETIL 500 MG PO TABS: 500.0000 mg | ORAL_TABLET | Freq: Two times a day (BID) | ORAL | Status: DC

## 2016-03-13 MED FILL — CEFUROXIME AXETIL 500 MG TA: 500 | 3 days supply | Qty: 6 | Fill #0

## 2016-03-13 MED FILL — AZITHROMYCIN 250 MG TABLET: 250 | 3 days supply | Qty: 3 | Fill #0

## 2016-03-13 NOTE — Discharge Instructions (Signed)
Dehydration, Adult Dehydration is a condition in which there is not enough fluid or water in the body. This happens when you lose more fluids than you take in. Important organs, such as the kidneys, brain, and heart, cannot function without a proper amount of fluids. Any loss of fluids from the body can lead to dehydration. Dehydration can range from mild to severe. This condition should be treated right away to prevent it from becoming severe. What are the causes? This condition may be caused by:  Vomiting.  Diarrhea.  Excessive sweating, such as from heat exposure or exercise.  Not drinking enough fluid, especially:  When ill.  While doing activity that requires a lot of energy.  Excessive urination.  Fever.  Infection.  Certain medicines, such as medicines that cause the body to lose excess fluid (diuretics).  Inability to access safe drinking water.  Reduced physical ability to get adequate water and food. What increases the risk? This condition is more likely to develop in people:  Who have a poorly controlled long-term (chronic) illness, such as diabetes, heart disease, or kidney disease.  Who are age 65 or older.  Who are disabled.  Who live in a place with high altitude.  Who play endurance sports. What are the signs or symptoms? Symptoms of mild dehydration may include:   Thirst.  Dry lips.  Slightly dry mouth.  Dry, warm skin.  Dizziness. Symptoms of moderate dehydration may include:   Very dry mouth.  Muscle cramps.  Dark urine. Urine may be the color of tea.  Decreased urine production.  Decreased tear production.  Heartbeat that is irregular or faster than normal (palpitations).  Headache.  Light-headedness, especially when you stand up from a sitting position.  Fainting (syncope). Symptoms of severe dehydration may include:   Changes in skin, such as:  Cold and clammy skin.  Blotchy (mottled) or pale skin.  Skin that does  not quickly return to normal after being lightly pinched and released (poor skin turgor).  Changes in body fluids, such as:  Extreme thirst.  No tear production.  Inability to sweat when body temperature is high, such as in hot weather.  Very little urine production.  Changes in vital signs, such as:  Weak pulse.  Pulse that is more than 100 beats a minute when sitting still.  Rapid breathing.  Low blood pressure.  Other changes, such as:  Sunken eyes.  Cold hands and feet.  Confusion.  Lack of energy (lethargy).  Difficulty waking up from sleep.  Short-term weight loss.  Unconsciousness. How is this diagnosed? This condition is diagnosed based on your symptoms and a physical exam. Blood and urine tests may be done to help confirm the diagnosis. How is this treated? Treatment for this condition depends on the severity. Mild or moderate dehydration can often be treated at home. Treatment should be started right away. Do not wait until dehydration becomes severe. Severe dehydration is an emergency and it needs to be treated in a hospital. Treatment for mild dehydration may include:   Drinking more fluids.  Replacing salts and minerals in your blood (electrolytes) that you may have lost. Treatment for moderate dehydration may include:   Drinking an oral rehydration solution (ORS). This is a drink that helps you replace fluids and electrolytes (rehydrate). It can be found at pharmacies and retail stores. Treatment for severe dehydration may include:   Receiving fluids through an IV tube.  Receiving an electrolyte solution through a feeding tube that is   passed through your nose and into your stomach (nasogastric tube, or NG tube).  Correcting any abnormalities in electrolytes.  Treating the underlying cause of dehydration. Follow these instructions at home:  If directed by your health care provider, drink an ORS:  Make an ORS by following instructions on the  package.  Start by drinking small amounts, about  cup (120 mL) every 5-10 minutes.  Slowly increase how much you drink until you have taken the amount recommended by your health care provider.  Drink enough clear fluid to keep your urine clear or pale yellow. If you were told to drink an ORS, finish the ORS first, then start slowly drinking other clear fluids. Drink fluids such as:  Water. Do not drink only water. Doing that can lead to having too little salt (sodium) in the body (hyponatremia).  Ice chips.  Fruit juice that you have added water to (diluted fruit juice).  Low-calorie sports drinks.  Avoid:  Alcohol.  Drinks that contain a lot of sugar. These include high-calorie sports drinks, fruit juice that is not diluted, and soda.  Caffeine.  Foods that are greasy or contain a lot of fat or sugar.  Take over-the-counter and prescription medicines only as told by your health care provider.  Do not take sodium tablets. This can lead to having too much sodium in the body (hypernatremia).  Eat foods that contain a healthy balance of electrolytes, such as bananas, oranges, potatoes, tomatoes, and spinach.  Keep all follow-up visits as told by your health care provider. This is important. Contact a health care provider if:  You have abdominal pain that:  Gets worse.  Stays in one area (localizes).  You have a rash.  You have a stiff neck.  You are more irritable than usual.  You are sleepier or more difficult to wake up than usual.  You feel weak or dizzy.  You feel very thirsty.  You have urinated only a small amount of very dark urine over 6-8 hours. Get help right away if:  You have symptoms of severe dehydration.  You cannot drink fluids without vomiting.  Your symptoms get worse with treatment.  You have a fever.  You have a severe headache.  You have vomiting or diarrhea that:  Gets worse.  Does not go away.  You have blood or green matter  (bile) in your vomit.  You have blood in your stool. This may cause stool to look black and tarry.  You have not urinated in 6-8 hours.  You faint.  Your heart rate while sitting still is over 100 beats a minute.  You have trouble breathing. This information is not intended to replace advice given to you by your health care provider. Make sure you discuss any questions you have with your health care provider. Document Released: 03/25/2005 Document Revised: 10/20/2015 Document Reviewed: 05/19/2015 Elsevier Interactive Patient Education  2017 [employer]  

## 2016-03-13 NOTE — Discharge Summary (Signed)
Physician Discharge Summary  Rachel Williams:[id number] DOB: 1950/09/11 DOA: 03/11/2016  PCP: Neldon Labella, MD  Admit date: 03/11/2016 Discharge date: 03/13/2016  Admitted From: Home Disposition:  Home  Recommendations for Outpatient Follow-up:  1. Follow up with PCP in 1-2 weeks 2. Recommend repeat CBC, BMET and Mg level in 1 week 3. Recommend repeat CXR in 2-3 weeks to ensure resolution of pneumonia  Discharge Condition:Improved CODE STATUS:Full Diet recommendation: Heart healthy   Brief/Interim Summary:  Principal Problem:   Sepsis (HCC) Secondary to severe dehydration, malnutrition and lobar pneumonia. Fevers resolved Continued empiric Rocephin and azithromycin. Clinically improved Flu PCR negative. Will complete course of PO azithro and ceftin on discharge  Active Problems: Hypotension Possibly due to dehydration. TSH and cortisol within normal limits. Orthostasis negative. Patient was continued with maintenance fluids with intermittent bolus    Hyponatremia Secondary to dehydration.. Improved with IV fluids.  Hypokalemia and hypomagnesemia Replaced  Hypocalcemia Corrected calcium of 7.7. Replenished low magnesium. Ordered was a calcium gluconate this admission.  Recurrent falls Likely secondary to failure to thrive. Seen by PT recommended home health. Now has 24-hour care.  Anemia Noted for significant drop in H&H from admission Iron normal, B12 within normal range Repeat HCT was unchanged. Check iron panel, B12 and folate. Recommend repeat CBC in one week  Alcohol use (HCC), daily 2-3 drinks of vodka Continued thiamine, folate and multivitamin. Remained stable  Ongoing tobacco use Counseled on cessation.  Continued nicotine patch while admitted     Malnutrition of moderate degree Nutrition consulted.   Anxiety and depression Seen by psych consult. Patient refused to take any medications.  Discharge Diagnoses:  Principal Problem:  Sepsis (HCC) Active Problems:   Hyponatremia   Recurrent falls   Hypokalemia   Alcohol use (HCC), daily 2-3 drinks of vodka   Smoker   Dehydration   Chronic constipation   Generalized weakness   Malnutrition of moderate degree   SIRS (systemic inflammatory response syndrome) (HCC)    Discharge Instructions     Medication List    STOP taking these medications   magnesium oxide 400 (241.3 Mg) MG tablet Commonly known as:  MAG-OX   metoprolol tartrate 25 MG tablet Commonly known as:  LOPRESSOR   predniSONE 10 MG tablet Commonly known as:  DELTASONE     TAKE these medications   albuterol 108 (90 Base) MCG/ACT inhaler Commonly known as:  PROVENTIL HFA;VENTOLIN HFA Inhale 2 puffs into the lungs every 6 (six) hours as needed for wheezing or shortness of breath.   azithromycin 250 MG tablet Commonly known as:  ZITHROMAX 1 tab po qday x 3 more days, next dose on 12/6, zero refills   bisacodyl 10 MG suppository Commonly known as:  DULCOLAX Place 1 suppository (10 mg total) rectally daily as needed for moderate constipation.   cefUROXime 500 MG tablet Commonly known as:  CEFTIN Take 1 tablet (500 mg total) by mouth 2 (two) times daily with a meal.   diphenhydrAMINE 25 MG tablet Commonly known as:  BENADRYL Take 1 tablet (25 mg total) by mouth every 6 (six) hours as needed for itching or allergies.   feeding supplement (ENSURE ENLIVE) Liqd Take 237 mLs by mouth 2 (two) times daily between meals.   folic acid 1 MG tablet Commonly known as:  FOLVITE Take 1 tablet (1 mg total) by mouth daily.   guaiFENesin 600 MG 12 hr tablet Commonly known as:  MUCINEX Take 1 tablet (600 mg total) by mouth 2 (two)  times daily.   nicotine 21 mg/24hr patch Commonly known as:  NICODERM CQ - dosed in mg/24 hours Place 1 patch (21 mg total) onto the skin daily.   polyethylene glycol packet Commonly known as:  MIRALAX / GLYCOLAX Take 17 g by mouth 2 (two) times daily.   potassium  chloride SA 20 MEQ tablet Commonly known as:  K-DUR,KLOR-CON Take 2 tablets (40 mEq total) by mouth daily.   senna-docusate 8.6-50 MG tablet Commonly known as:  Senokot-S Take 1 tablet by mouth 2 (two) times daily.   thiamine 100 MG tablet Take 1 tablet (100 mg total) by mouth daily.       Allergies  Allergen Reactions  . Penicillins     Has patient had a PCN reaction causing immediate rash, facial/tongue/throat swelling, SOB or lightheadedness with hypotension: Yes. Has patient had a PCN reaction causing severe rash involving mucus membranes or skin necrosis: No answer Has patient had a PCN reaction that required hospitalization No Has patient had a PCN reaction occurring within the last 10 years: No If all of the above answers are "NO", then may proceed with Cephalosporin use.    . Ciprofloxacin Rash  . Levofloxacin Rash    Rash on back after 5 days therapy.   . Nickel Rash    Anything with nickel in it.     Consultations:  Psychiatry  Procedures/Studies: Dg Chest 1 View  Result Date: 03/11/2016 CLINICAL DATA:  Dehydration and malnutrition.  Dyspnea and cough. EXAM: CHEST 1 VIEW COMPARISON:  03/11/2016 at 1520 hours and 05/17/2015 CXR FINDINGS: A lateral view of the chest was requested only. The airspace opacity at the left lung base appears to localize to the lingula. Chronic stable mild compression of a mid thoracic vertebral body possibly T7 is unchanged. No effusion or pneumothorax. IMPRESSION: Left basilar airspace disease noted earlier appears to localize to the lingula. Chronic stable mid thoracic compression fracture possibly representing the T7 vertebral body. Electronically Signed   By: Tollie Ethavid  Kwon M.D.   On: 03/11/2016 18:03   Dg Knee 2 Views Right  Result Date: 03/11/2016 CLINICAL DATA:  Status post fall.  Right knee pain. EXAM: RIGHT KNEE - 1-2 VIEW COMPARISON:  None. FINDINGS: No evidence of fracture, dislocation, or joint effusion. Severe generalized  osteopenia. No evidence of arthropathy or other focal bone abnormality. Soft tissues are unremarkable. IMPRESSION: No acute osseous injury of the right knee. Electronically Signed   By: Elige KoHetal  Patel   On: 03/11/2016 17:20   Dg Chest Port 1 View  Result Date: 03/11/2016 CLINICAL DATA:  Dehydration, confusion. EXAM: PORTABLE CHEST 1 VIEW COMPARISON:  05/27/2015 FINDINGS: There is a hazy airspace opacity in the left lower lung concerning for pneumonia. There is no pleural effusion or pneumothorax. The heart and mediastinal contours are unremarkable. The osseous structures are unremarkable. IMPRESSION: 1. Hazy airspace opacity in the left lower lung concerning for pneumonia. Followup PA and lateral chest X-ray is recommended in 3-4 weeks following trial of antibiotic therapy to ensure resolution and exclude underlying malignancy. Electronically Signed   By: Elige KoHetal  Patel   On: 03/11/2016 15:43    Subjective: No complaints. Eager to go home  Discharge Exam: Vitals:   03/12/16 2149 03/13/16 0519  BP: 111/81 (!) 108/91  Pulse: (!) 108 97  Resp: 18 18  Temp: 99.1 F (37.3 C) 98.9 F (37.2 C)   Vitals:   03/12/16 1503 03/12/16 2149 03/12/16 2157 03/13/16 0519  BP: 119/89 111/81  (!) 108/91  Pulse: (!) 105 (!) 108  97  Resp: 18 18  18   Temp: 98 F (36.7 C) 99.1 F (37.3 C)  98.9 F (37.2 C)  TempSrc: Oral Oral  Oral  SpO2: 96% (!) 88% 92% 92%  Weight:      Height:        General: Pt is alert, awake, not in acute distress Cardiovascular: RRR, S1/S2 +, no rubs, no gallops Respiratory: CTA bilaterally, no wheezing, no rhonchi Abdominal: Soft, NT, ND, bowel sounds + Extremities: no edema, no cyanosis   The results of significant diagnostics from this hospitalization (including imaging, microbiology, ancillary and laboratory) are listed below for reference.     Microbiology: Recent Results (from the past 240 hour(s))  Urine culture     Status: None   Collection Time: 03/11/16  3:00 PM   Result Value Ref Range Status   Specimen Description URINE, CATHETERIZED  Final   Special Requests NONE  Final   Culture NO GROWTH [facility]   Final   Report Status 03/13/2016 FINAL  Final  Culture, blood (Routine x 2)     Status: None (Preliminary result)   Collection Time: 03/11/16  3:28 PM  Result Value Ref Range Status   Specimen Description BLOOD BLOOD LEFT FOREARM  Final   Special Requests IN PEDIATRIC BOTTLE 2CC  Final   Culture   Final    NO GROWTH < 24 HOURS Performed at [facility]    Report Status PENDING  Incomplete  Culture, blood (Routine x 2)     Status: None (Preliminary result)   Collection Time: 03/11/16  3:35 PM  Result Value Ref Range Status   Specimen Description BLOOD RIGHT ANTECUBITAL  Final   Special Requests BOTTLES DRAWN AEROBIC AND ANAEROBIC 5CC  Final   Culture   Final    NO GROWTH < 24 HOURS Performed at [facility]    Report Status PENDING  Incomplete  MRSA PCR Screening     Status: None   Collection Time: 03/12/16  2:58 AM  Result Value Ref Range Status   MRSA by PCR NEGATIVE NEGATIVE Final    Comment:        The GeneXpert MRSA Assay (FDA approved for NASAL specimens only), is one component of a comprehensive MRSA colonization surveillance program. It is not intended to diagnose MRSA infection nor to guide or monitor treatment for MRSA infections.      Labs: BNP (last 3 results)  Recent Labs  05/27/15 1300  BNP 206.4*   Basic Metabolic Panel:  Recent Labs Lab 03/11/16 1539 03/11/16 2239 03/12/16 0419 03/12/16 1034 03/12/16 1631 03/13/16 0338  NA  --  126* 129* 130* 130* 131*  K  --  2.8* 3.9 4.2 4.6 4.0  CL  --  87* 93* 98* 99* 100*  CO2  --  30 26 23 24 26   GLUCOSE  --  134* 117* 141* 98 104*  BUN  --  7 6 5* 5* <5*  CREATININE  --  0.57 0.55 0.59 0.51 0.49  CALCIUM  --  6.6* 6.6* 6.3* 6.8* 6.7*  MG 0.9*  --   --  1.3*  --  1.8   Liver Function Tests:  Recent Labs Lab  03/11/16 1535  AST 73*  ALT 26  ALKPHOS 179*  BILITOT 1.7*  PROT 6.3*  ALBUMIN 2.6*   No results for input(s): LIPASE, AMYLASE in the last 168 hours. No results for input(s): AMMONIA in the  last 168 hours. CBC:  Recent Labs Lab 03/11/16 1535 03/11/16 2239 03/12/16 1631  WBC 12.9* 8.3  --   NEUTROABS 11.0*  --   --   HGB 12.1 9.7*  --   HCT 32.2* 26.3* 26.6*  MCV 93.6 94.3  --   PLT 287 263  --    Cardiac Enzymes: No results for input(s): CKTOTAL, CKMB, CKMBINDEX, TROPONINI in the last 168 hours. BNP: Invalid input(s): POCBNP CBG: No results for input(s): GLUCAP in the last 168 hours. D-Dimer No results for input(s): DDIMER in the last 72 hours. Hgb A1c No results for input(s): HGBA1C in the last 72 hours. Lipid Profile No results for input(s): CHOL, HDL, LDLCALC, TRIG, CHOLHDL, LDLDIRECT in the last 72 hours. Thyroid function studies  Recent Labs  03/12/16 0419  TSH 2.294   Anemia work up  Recent Labs  03/12/16 1631  VITAMINB12 399  FERRITIN 648*  TIBC NOT CALCULATED  IRON 29   Urinalysis    Component Value Date/Time   COLORURINE AMBER (A) 03/11/2016 1500   APPEARANCEUR TURBID (A) 03/11/2016 1500   LABSPEC 1.013 03/11/2016 1500   PHURINE 6.0 03/11/2016 1500   GLUCOSEU NEGATIVE 03/11/2016 1500   HGBUR LARGE (A) 03/11/2016 1500   BILIRUBINUR NEGATIVE 03/11/2016 1500   KETONESUR NEGATIVE 03/11/2016 1500   PROTEINUR 30 (A) 03/11/2016 1500   UROBILINOGEN 1.0 01/11/2010 1611   NITRITE NEGATIVE 03/11/2016 1500   LEUKOCYTESUR LARGE (A) 03/11/2016 1500   Sepsis Labs Invalid input(s): PROCALCITONIN,  WBC,  LACTICIDVEN Microbiology Recent Results (from the past 240 hour(s))  Urine culture     Status: None   Collection Time: 03/11/16  3:00 PM  Result Value Ref Range Status   Specimen Description URINE, CATHETERIZED  Final   Special Requests NONE  Final   Culture NO GROWTH Performed at [facility]   Final   Report Status 03/13/2016 FINAL   Final  Culture, blood (Routine x 2)     Status: None (Preliminary result)   Collection Time: 03/11/16  3:28 PM  Result Value Ref Range Status   Specimen Description BLOOD BLOOD LEFT FOREARM  Final   Special Requests IN PEDIATRIC BOTTLE 2CC  Final   Culture   Final    NO GROWTH < 24 HOURS Performed at [facility]    Report Status PENDING  Incomplete  Culture, blood (Routine x 2)     Status: None (Preliminary result)   Collection Time: 03/11/16  3:35 PM  Result Value Ref Range Status   Specimen Description BLOOD RIGHT ANTECUBITAL  Final   Special Requests BOTTLES DRAWN AEROBIC AND ANAEROBIC 5CC  Final   Culture   Final    NO GROWTH < 24 HOURS Performed at [facility]    Report Status PENDING  Incomplete  MRSA PCR Screening     Status: None   Collection Time: 03/12/16  2:58 AM  Result Value Ref Range Status   MRSA by PCR NEGATIVE NEGATIVE Final    Comment:        The GeneXpert MRSA Assay (FDA approved for NASAL specimens only), is one component of a comprehensive MRSA colonization surveillance program. It is not intended to diagnose MRSA infection nor to guide or monitor treatment for MRSA infections.      SIGNED:   Jerald Kief, MD  [facility] 03/13/2016, 3:30 PM  If 7PM-7AM, please contact night-coverage [email or web address] Password [email or web address]

## 2016-03-13 NOTE — Progress Notes (Signed)
D/C instructions reviewed w/ pt and dtr. Both verbalize understanding and all questions answered. Pt d/c in w/c in stable condition by NT to dtr's car. Pt dtr in possession of d/c instructions, scripts, and all personal belongings.

## 2016-03-13 NOTE — Progress Notes (Signed)
[facility] referral given to in house rep.

## 2016-03-16 LAB — CULTURE, BLOOD (ROUTINE X 2)
Culture: NO GROWTH
Culture: NO GROWTH

## 2016-03-29 MED FILL — VIT D2 1.25 MG (50,000 UNIT: 1.25 MG | 84 days supply | Qty: 12 | Fill #0

## 2016-04-18 ENCOUNTER — Other Ambulatory Visit: Payer: Self-pay | Admitting: Family Medicine

## 2016-04-18 DIAGNOSIS — R9389 Abnormal findings on diagnostic imaging of other specified body structures: Secondary | ICD-10-CM

## 2016-04-24 ENCOUNTER — Other Ambulatory Visit: Payer: Self-pay

## 2016-05-09 ENCOUNTER — Ambulatory Visit
Admission: RE | Admit: 2016-05-09 | Discharge: 2016-05-09 | Disposition: A | Discharge status: Home / Self Care | Payer: Medicare Other | Source: Ambulatory Visit | Attending: Family Medicine | Admitting: Family Medicine

## 2016-05-09 DIAGNOSIS — R9389 Abnormal findings on diagnostic imaging of other specified body structures: Secondary | ICD-10-CM

## 2016-05-09 MED ORDER — IOPAMIDOL (ISOVUE-300) INJECTION 61%
75.0000 mL | Freq: Once | INTRAVENOUS | Status: AC | PRN
  Administered 2016-05-09: 75 mL via INTRAVENOUS

## 2016-05-21 ENCOUNTER — Emergency Department (HOSPITAL_COMMUNITY): Payer: Medicare Other

## 2016-05-21 ENCOUNTER — Encounter (HOSPITAL_COMMUNITY): Payer: Self-pay | Admitting: *Deleted

## 2016-05-21 ENCOUNTER — Inpatient Hospital Stay (HOSPITAL_COMMUNITY)
Admission: EM | Admit: 2016-05-21 | Discharge: 2016-05-27 | DRG: 871 | Disposition: A | Discharge status: Skilled Nursing Facility | Payer: Medicare Other | Attending: Internal Medicine | Admitting: Internal Medicine

## 2016-05-21 DIAGNOSIS — R578 Other shock: Secondary | ICD-10-CM | POA: Diagnosis present

## 2016-05-21 DIAGNOSIS — Z880 Allergy status to penicillin: Secondary | ICD-10-CM | POA: Diagnosis not present

## 2016-05-21 DIAGNOSIS — J1000 Influenza due to other identified influenza virus with unspecified type of pneumonia: Secondary | ICD-10-CM | POA: Diagnosis present

## 2016-05-21 DIAGNOSIS — R296 Repeated falls: Secondary | ICD-10-CM | POA: Diagnosis present

## 2016-05-21 DIAGNOSIS — E861 Hypovolemia: Secondary | ICD-10-CM | POA: Diagnosis present

## 2016-05-21 DIAGNOSIS — E876 Hypokalemia: Secondary | ICD-10-CM | POA: Diagnosis not present

## 2016-05-21 DIAGNOSIS — E871 Hypo-osmolality and hyponatremia: Secondary | ICD-10-CM | POA: Diagnosis present

## 2016-05-21 DIAGNOSIS — Z881 Allergy status to other antibiotic agents status: Secondary | ICD-10-CM | POA: Diagnosis not present

## 2016-05-21 DIAGNOSIS — Z789 Other specified health status: Secondary | ICD-10-CM | POA: Diagnosis present

## 2016-05-21 DIAGNOSIS — R402252 Coma scale, best verbal response, oriented, at arrival to emergency department: Secondary | ICD-10-CM | POA: Diagnosis present

## 2016-05-21 DIAGNOSIS — D539 Nutritional anemia, unspecified: Secondary | ICD-10-CM | POA: Diagnosis present

## 2016-05-21 DIAGNOSIS — I4519 Other right bundle-branch block: Secondary | ICD-10-CM | POA: Diagnosis present

## 2016-05-21 DIAGNOSIS — K760 Fatty (change of) liver, not elsewhere classified: Secondary | ICD-10-CM | POA: Diagnosis present

## 2016-05-21 DIAGNOSIS — Z7289 Other problems related to lifestyle: Secondary | ICD-10-CM | POA: Diagnosis present

## 2016-05-21 DIAGNOSIS — R7989 Other specified abnormal findings of blood chemistry: Secondary | ICD-10-CM | POA: Diagnosis not present

## 2016-05-21 DIAGNOSIS — R945 Abnormal results of liver function studies: Secondary | ICD-10-CM

## 2016-05-21 DIAGNOSIS — A419 Sepsis, unspecified organism: Principal | ICD-10-CM | POA: Diagnosis present

## 2016-05-21 DIAGNOSIS — Z91048 Other nonmedicinal substance allergy status: Secondary | ICD-10-CM

## 2016-05-21 DIAGNOSIS — N390 Urinary tract infection, site not specified: Secondary | ICD-10-CM | POA: Diagnosis present

## 2016-05-21 DIAGNOSIS — Z933 Colostomy status: Secondary | ICD-10-CM

## 2016-05-21 DIAGNOSIS — R579 Shock, unspecified: Secondary | ICD-10-CM | POA: Diagnosis present

## 2016-05-21 DIAGNOSIS — F17210 Nicotine dependence, cigarettes, uncomplicated: Secondary | ICD-10-CM | POA: Diagnosis present

## 2016-05-21 DIAGNOSIS — K5909 Other constipation: Secondary | ICD-10-CM | POA: Diagnosis present

## 2016-05-21 DIAGNOSIS — R402362 Coma scale, best motor response, obeys commands, at arrival to emergency department: Secondary | ICD-10-CM | POA: Diagnosis present

## 2016-05-21 DIAGNOSIS — E440 Moderate protein-calorie malnutrition: Secondary | ICD-10-CM | POA: Diagnosis present

## 2016-05-21 DIAGNOSIS — R402142 Coma scale, eyes open, spontaneous, at arrival to emergency department: Secondary | ICD-10-CM | POA: Diagnosis present

## 2016-05-21 DIAGNOSIS — Z79899 Other long term (current) drug therapy: Secondary | ICD-10-CM | POA: Diagnosis not present

## 2016-05-21 DIAGNOSIS — Z820 Family history of epilepsy and other diseases of the nervous system: Secondary | ICD-10-CM

## 2016-05-21 DIAGNOSIS — F10239 Alcohol dependence with withdrawal, unspecified: Secondary | ICD-10-CM | POA: Diagnosis present

## 2016-05-21 DIAGNOSIS — J101 Influenza due to other identified influenza virus with other respiratory manifestations: Secondary | ICD-10-CM | POA: Diagnosis present

## 2016-05-21 DIAGNOSIS — R1110 Vomiting, unspecified: Secondary | ICD-10-CM | POA: Diagnosis present

## 2016-05-21 LAB — CBC WITH DIFFERENTIAL/PLATELET
Basophils Absolute: 0 10*3/uL (ref 0.0–0.1)
Basophils Relative: 0 %
Eosinophils Absolute: 0 10*3/uL (ref 0.0–0.7)
Eosinophils Relative: 0 %
HCT: 19.8 % — ABNORMAL LOW (ref 36.0–46.0)
Hemoglobin: 7.1 g/dL — ABNORMAL LOW (ref 12.0–15.0)
Lymphocytes Relative: 6 %
Lymphs Abs: 0.9 10*3/uL (ref 0.7–4.0)
MCH: 35.7 pg — ABNORMAL HIGH (ref 26.0–34.0)
MCHC: 35.9 g/dL (ref 30.0–36.0)
MCV: 99.5 fL (ref 78.0–100.0)
Monocytes Absolute: 0.8 10*3/uL (ref 0.1–1.0)
Monocytes Relative: 6 %
Neutro Abs: 12.4 10*3/uL — ABNORMAL HIGH (ref 1.7–7.7)
Neutrophils Relative %: 88 %
Platelets: 380 10*3/uL (ref 150–400)
RBC: 1.99 MIL/uL — ABNORMAL LOW (ref 3.87–5.11)
RDW: 14.8 % (ref 11.5–15.5)
WBC: 14.1 10*3/uL — ABNORMAL HIGH (ref 4.0–10.5)

## 2016-05-21 LAB — COMPREHENSIVE METABOLIC PANEL
ALBUMIN: 2.2 g/dL — AB (ref 3.5–5.0)
ALT: 27 U/L (ref 14–54)
AST: 205 U/L — AB (ref 15–41)
Alkaline Phosphatase: 191 U/L — ABNORMAL HIGH (ref 38–126)
Anion gap: 14 (ref 5–15)
BUN: 8 mg/dL (ref 6–20)
CO2: 26 mmol/L (ref 22–32)
CREATININE: 0.7 mg/dL (ref 0.44–1.00)
Calcium: 7.8 mg/dL — ABNORMAL LOW (ref 8.9–10.3)
Chloride: 92 mmol/L — ABNORMAL LOW (ref 101–111)
GFR calc Af Amer: >60 mL/min (ref >60)
GFR calc non Af Amer: >60 mL/min (ref >60)
GLUCOSE: 118 mg/dL — AB (ref 65–99)
Potassium: 2.9 mmol/L — ABNORMAL LOW (ref 3.5–5.1)
SODIUM: 132 mmol/L — AB (ref 135–145)
Total Bilirubin: 1.3 mg/dL — ABNORMAL HIGH (ref 0.3–1.2)
Total Protein: 5.7 g/dL — ABNORMAL LOW (ref 6.5–8.1)

## 2016-05-21 LAB — I-STAT CG4 LACTIC ACID, ED
Lactic Acid, Venous: 6.93 mmol/L (ref 0.5–1.9)
Lactic Acid, Venous: 4.49 mmol/L (ref 0.5–1.9)

## 2016-05-21 LAB — ETHANOL: Alcohol, Ethyl (B): <5 mg/dL (ref <5)

## 2016-05-21 LAB — PROTIME-INR
INR: 1.09
Prothrombin Time: 14.1 seconds (ref 11.4–15.2)

## 2016-05-21 LAB — INFLUENZA PANEL BY PCR (TYPE A & B)
INFLAPCR: POSITIVE — AB
INFLBPCR: NEGATIVE

## 2016-05-21 MED ORDER — ONDANSETRON HCL 4 MG/2ML IJ SOLN
4.0000 mg | Freq: Once | INTRAMUSCULAR | Status: AC
  Administered 2016-05-21: 4 mg via INTRAVENOUS
  Filled 2016-05-21: qty 2

## 2016-05-21 MED ORDER — VANCOMYCIN HCL IN DEXTROSE 1-5 GM/200ML-% IV SOLN
1000.0000 mg | Freq: Once | INTRAVENOUS | Status: AC
  Administered 2016-05-21: 1000 mg via INTRAVENOUS
  Filled 2016-05-21: qty 200

## 2016-05-21 MED ORDER — LEVOFLOXACIN IN D5W 750 MG/150ML IV SOLN: 750.0000 mg | INTRAVENOUS | Status: DC

## 2016-05-21 MED ORDER — SODIUM CHLORIDE 0.9 % IV SOLN
INTRAVENOUS | Status: DC
  Administered 2016-05-21: 21:00:00 via INTRAVENOUS

## 2016-05-21 MED ORDER — ACETAMINOPHEN 325 MG PO TABS
650.0000 mg | ORAL_TABLET | Freq: Once | ORAL | Status: AC
  Administered 2016-05-21: 650 mg via ORAL
  Filled 2016-05-21: qty 2

## 2016-05-21 MED ORDER — SODIUM CHLORIDE 0.9 % IV BOLUS (SEPSIS): 1000.0000 mL | Freq: Once | INTRAVENOUS | Status: DC

## 2016-05-21 MED ORDER — SODIUM CHLORIDE 0.9 % IV SOLN: 10.0000 mL/h | Freq: Once | INTRAVENOUS | Status: DC

## 2016-05-21 MED ORDER — DEXTROSE 5 % IV SOLN
2.0000 g | Freq: Three times a day (TID) | INTRAVENOUS | Status: DC
  Filled 2016-05-21: qty 2

## 2016-05-21 MED ORDER — SODIUM CHLORIDE 0.9 % IV BOLUS (SEPSIS)
2000.0000 mL | Freq: Once | INTRAVENOUS | Status: AC
  Administered 2016-05-21: 2000 mL via INTRAVENOUS

## 2016-05-21 MED ORDER — OSELTAMIVIR PHOSPHATE 75 MG PO CAPS
75.0000 mg | ORAL_CAPSULE | Freq: Two times a day (BID) | ORAL | Status: AC
  Administered 2016-05-22 – 2016-05-26 (×10): 75 mg via ORAL
  Filled 2016-05-21 (×10): qty 1

## 2016-05-21 MED ORDER — LEVOFLOXACIN IN D5W 750 MG/150ML IV SOLN
750.0000 mg | Freq: Once | INTRAVENOUS | Status: AC
  Administered 2016-05-21: 750 mg via INTRAVENOUS
  Filled 2016-05-21: qty 150

## 2016-05-21 MED ORDER — ACETAMINOPHEN 325 MG PO TABS: 650.0000 mg | ORAL_TABLET | Freq: Once | ORAL | Status: DC

## 2016-05-21 MED ORDER — VANCOMYCIN HCL IN DEXTROSE 1-5 GM/200ML-% IV SOLN: 1000.0000 mg | Freq: Two times a day (BID) | INTRAVENOUS | Status: DC

## 2016-05-21 MED ORDER — DEXTROSE 5 % IV SOLN
2.0000 g | Freq: Once | INTRAVENOUS | Status: AC
  Administered 2016-05-21: 2 g via INTRAVENOUS
  Filled 2016-05-21: qty 2

## 2016-05-21 NOTE — Progress Notes (Signed)
Pharmacy Antibiotic Note  Rachel Williams is a 66 y.o. female admitted on 05/21/2016 with sepsis.  In the ED patient received levofloxacin 750mg  IV, Aztreonam 2gm IV, and Vancomycin 1gm IV x 1 dose each.  Pharmacy has been consulted for Vancomycin, Levofloxacin, and Aztreonam dosing.  Plan:  Levofloxacin 750mg  IV q24h  Aztreonam 2gm IV q8h  Vancomycin 500mg  IV q12h  Vancomycin trough goal: 15-20 mcg/ml  F/u cultures  Height: 5\' 4"  (162.6 cm) Weight: 135 lb (61.2 kg) IBW/kg (Calculated) : 54.7  Temp (24hrs), Avg:100.8 F (38.2 C), Min:98.9 F (37.2 C), Max:102 F (38.9 C)   Recent Labs Lab 05/21/16 2031 05/21/16 2055 05/21/16 2124  WBC  --  14.1*  --   CREATININE  --  0.70  --   LATICACIDVEN 6.93*  --  4.49*    Estimated Creatinine Clearance: 58.9 mL/min (by C-G formula based on SCr of 0.7 mg/dL).    Allergies  Allergen Reactions  . Ciprofloxacin Rash  . Levofloxacin Rash    Rash on back after 5 days therapy.   . Nickel Rash    Anything with nickel in it.   Marland Kitchen. Penicillins Rash    Has patient had a PCN reaction causing immediate rash, facial/tongue/throat swelling, SOB or lightheadedness with hypotension: Yes. Has patient had a PCN reaction causing severe rash involving mucus membranes or skin necrosis: No answer Has patient had a PCN reaction that required hospitalization No Has patient had a PCN reaction occurring within the last 10 years: No If all of the above answers are "NO", then may proceed with Cephalosporin use.      Antimicrobials this admission: 2/13 vanc >>   2/13 levofloxacin >>   2/13 aztreonam >>  Dose adjustments this admission:    Microbiology results: 2/13 BCx: sent 2/13 Influenza Panel:     Thank you for allowing pharmacy to be a part of this patient's care.  Maryellen PilePoindexter, Bransyn Adami Trefz, PharmD 05/21/2016 9:55 PM

## 2016-05-21 NOTE — ED Triage Notes (Signed)
Pt arrives via POV with her daughter. Daughter states pt has not been eating/drinking and is dehydrated. She checked her blood pressure at home and it was low and she was unable to stand up, prompting her to bring her in. She states the caregivers at home reported the pt has had some vomiting and diarrhea as well. Pt initially hypotensive in lobby area after check in and brought back to treatment room for examination.

## 2016-05-21 NOTE — ED Notes (Signed)
Pt is aware urine is needed 

## 2016-05-21 NOTE — ED Notes (Signed)
Dr. Freida BusmanAllen made aware of Latic Acid 6.93

## 2016-05-21 NOTE — ED Provider Notes (Signed)
[facility]-EMERGENCY DEPT Provider Note   CSN: [id number] Arrival date & time: 05/21/16  1904     History   Chief Complaint Chief Complaint  Patient presents with  . Emesis    HPI Rachel Williams is a 66 y.o. female.  66 year old female presents with cough and congestion times several days. Cough is been nonproductive and has been associated with some dyspnea. She is also had decreased oral intake but does drink large amount of alcohol daily. She has had some nonbilious emesis with associated watery diarrhea. Her diarrhea has not been bloody. Denies any urinary symptoms. No abdominal discomfort. Denies any chest discomfort. Nothing makes her symptoms better. No treatment use prior to arrival.      Past Medical History:  Diagnosis Date  . Pneumonia     Patient Active Problem List   Diagnosis Date Noted  . Sepsis (HCC) 03/11/2016  . SIRS (systemic inflammatory response syndrome) (HCC) 03/11/2016  . Malnutrition of moderate degree 05/29/2015  . NSVT (nonsustained ventricular tachycardia) (HCC) 05/29/2015  . Hyponatremia 05/27/2015  . Recurrent falls 05/27/2015  . Facial bruising 05/27/2015  . Hypomagnesemia 05/27/2015  . Hypokalemia 05/27/2015  . Alcohol use (HCC), daily 2-3 drinks of vodka 05/27/2015  . Smoker 05/27/2015  . Dehydration 05/27/2015  . Vertebral compression fracture (HCC) 05/27/2015  . Acute pharyngitis 05/27/2015  . Chronic constipation 05/27/2015  . Generalized weakness     Past Surgical History:  Procedure Laterality Date  . COLON RESECTION     perforation  . COLOSTOMY      OB History    No data available       Home Medications    Prior to Admission medications   Medication Sig Start Date End Date Taking? Authorizing Provider  albuterol (PROVENTIL HFA;VENTOLIN HFA) 108 (90 Base) MCG/ACT inhaler Inhale 2 puffs into the lungs every 6 (six) hours as needed for wheezing or shortness of breath. Patient not taking: Reported on 03/11/2016 05/29/15    Rolly SalterPranav M Patel, MD  azithromycin [facility](ZITHROMAX) 250 MG tablet 1 tab po qday x 3 more days, next dose on 12/6, zero refills 03/13/16   Jerald KiefStephen K Chiu, MD  bisacodyl (DULCOLAX) 10 MG suppository Place 1 suppository (10 mg total) rectally daily as needed for moderate constipation. Patient not taking: Reported on 03/11/2016 05/29/15   Rolly SalterPranav M Patel, MD  cefUROXime (CEFTIN) 500 MG tablet Take 1 tablet (500 mg total) by mouth 2 (two) times daily with a meal. 03/13/16   Jerald KiefStephen K Chiu, MD  diphenhydrAMINE (BENADRYL) 25 MG tablet Take 1 tablet (25 mg total) by mouth every 6 (six) hours as needed for itching or allergies. Patient not taking: Reported on 03/11/2016 05/29/15   Rolly SalterPranav M Patel, MD  feeding supplement, ENSURE ENLIVE, (ENSURE ENLIVE) LIQD Take 237 mLs by mouth 2 (two) times daily between meals. Patient not taking: Reported on 03/11/2016 05/29/15   Rolly SalterPranav M Patel, MD  folic acid (FOLVITE) 1 MG tablet Take 1 tablet (1 mg total) by mouth daily. Patient not taking: Reported on 03/11/2016 05/29/15   Rolly SalterPranav M Patel, MD  guaiFENesin (MUCINEX) 600 MG 12 hr tablet Take 1 tablet (600 mg total) by mouth 2 (two) times daily. Patient not taking: Reported on 03/11/2016 05/29/15   Rolly SalterPranav M Patel, MD  nicotine (NICODERM CQ - DOSED IN MG/24 HOURS) 21 mg/24hr patch Place 1 patch (21 mg total) onto the skin daily. Patient not taking: Reported on 03/11/2016 05/29/15   Rolly SalterPranav M Patel, MD  polyethylene glycol [facility](MIRALAX /  GLYCOLAX) packet Take 17 g by mouth 2 (two) times daily. Patient not taking: Reported on 03/11/2016 05/29/15   Rolly Salter, MD  potassium chloride SA (K-DUR,KLOR-CON) 20 MEQ tablet Take 2 tablets (40 mEq total) by mouth daily. Patient not taking: Reported on 03/11/2016 05/29/15   Rolly Salter, MD  senna-docusate (SENOKOT-S) 8.6-50 MG tablet Take 1 tablet by mouth 2 (two) times daily. Patient not taking: Reported on 03/11/2016 05/29/15   Rolly Salter, MD  thiamine 100 MG tablet Take 1 tablet (100 mg total) by mouth  daily. Patient not taking: Reported on 03/11/2016 05/29/15   Rolly Salter, MD    Family History Family History  Problem Relation Age of Onset  . Alzheimer's disease Mother   . Cancer Sister     Social History Social History  Substance Use Topics  . Smoking status: Current Every Day Smoker    Packs/day: 1.00    Years: 50.00    Types: Cigarettes  . Smokeless tobacco: Never Used  . Alcohol use 8.4 oz/week    14 Standard drinks or equivalent per week     Comment: daily     Allergies   Penicillins; Ciprofloxacin; Levofloxacin; and Nickel   Review of Systems Review of Systems  All other systems reviewed and are negative.    Physical Exam Updated Vital Signs BP 110/73 (BP Location: Left Arm)   Pulse 119   Temp 100.8 F (38.2 C) (Oral)   Resp 18   Ht 5\' 4"  (1.626 m)   Wt 61.2 kg   SpO2 94%   BMI 23.17 kg/m   Physical Exam  Constitutional: She is oriented to person, place, and time. She appears well-developed and well-nourished.  Non-toxic appearance. No distress.  HENT:  Head: Normocephalic and atraumatic.  Eyes: Conjunctivae, EOM and lids are normal. Pupils are equal, round, and reactive to light.  Neck: Normal range of motion. Neck supple. No tracheal deviation present. No thyroid mass present.  Cardiovascular: Regular rhythm and normal heart sounds.  Tachycardia present.  Exam reveals no gallop.   No murmur heard. Pulmonary/Chest: Effort normal. No stridor. No respiratory distress. She has decreased breath sounds. She has no wheezes. She has no rhonchi. She has no rales.  Abdominal: Soft. Normal appearance and bowel sounds are normal. She exhibits no distension. There is no tenderness. There is no rebound and no CVA tenderness.  Musculoskeletal: Normal range of motion. She exhibits no edema or tenderness.  Neurological: She is alert and oriented to person, place, and time. She has normal strength. No cranial nerve deficit or sensory deficit. GCS eye subscore is  4. GCS verbal subscore is 5. GCS motor subscore is 6.  Skin: Skin is warm and dry. No abrasion and no rash noted.  Psychiatric: She has a normal mood and affect. Her speech is normal and behavior is normal.  Nursing note and vitals reviewed.    ED Treatments / Results  Labs (all labs ordered are listed, but only abnormal results are displayed) Labs Reviewed  CULTURE, BLOOD (ROUTINE X 2)  CULTURE, BLOOD (ROUTINE X 2)  COMPREHENSIVE METABOLIC PANEL  CBC WITH DIFFERENTIAL/PLATELET  PROTIME-INR  URINALYSIS, ROUTINE W REFLEX MICROSCOPIC  I-STAT CG4 LACTIC ACID, ED    EKG  EKG Interpretation  Date/Time:  Tuesday May 21 2016 19:50:49 EST Ventricular Rate:  120 PR Interval:    QRS Duration: 113 QT Interval:  336 QTC Calculation: 475 R Axis:   79 Text Interpretation:  Sinus tachycardia Incomplete right  bundle branch block Confirmed by Cameron Schwinn  MD, Amerika Nourse ([id number]) on 05/21/2016 8:13:58 PM       Radiology No results found.  Procedures Procedures (including critical care time)  Medications Ordered in ED Medications  sodium chloride 0.9 % bolus 2,000 mL (not administered)  0.9 %  sodium chloride infusion (not administered)  ondansetron (ZOFRAN) injection 4 mg (not administered)  acetaminophen (TYLENOL) tablet 650 mg (not administered)     Initial Impression / Assessment and Plan / ED Course  I have reviewed the triage vital signs and the nursing notes.  Pertinent labs & imaging results that were available during my care of the patient were reviewed by me and considered in my medical decision making (see chart for details).     Patient hypotensive on arrival here and given IV fluids. Responded well initially but then became more hypotensive. Sepsis order set use. Started on IV antibiotics for sepsis of unknown etiology. Patients repeat lactate had decreased. She is mentating appropriately. She is able to maintain her airway. Patient's hemoglobin noted and packed red blood  cells ordered. Discussed with critical care, Dr. Levada Schilling, who recommends continued IV fluids as well as blood and the patient to be admitted to stepdown unit by the hospitalist.   CRITICAL CARE Performed by: Lorre Nick T Total critical care time: 75 minutes Critical care time was exclusive of separately billable procedures and treating other patients. Critical care was necessary to treat or prevent imminent or life-threatening deterioration. Critical care was time spent personally by me on the following activities: development of treatment plan with patient and/or surrogate as well as nursing, discussions with consultants, evaluation of patient's response to treatment, examination of patient, obtaining history from patient or surrogate, ordering and performing treatments and interventions, ordering and review of laboratory studies, ordering and review of radiographic studies, pulse oximetry and re-evaluation of patient's condition.  Final Clinical Impressions(s) / ED Diagnoses   Final diagnoses:  None    New Prescriptions New Prescriptions   No medications on file     Lorre Nick, MD 05/21/16 2249

## 2016-05-21 NOTE — H&P (Signed)
History and Physical  Patient Name: Rachel Williams     VWU:[id number]    DOB: 09-30-1950    DOA: 05/21/2016 PCP: Neldon Labella, MD   Patient coming from: Home  Chief Complaint: Malaise, vomiting, hypotension  HPI: Rachel Williams is a 66 y.o. female with no significant past medical history except a perforated viscus a few years ago, repaired and alcoholism who presents with influenza like illness.  The patient was in her usual state of health until the last few days, when her daughter noticed that she was not eating, drinking, and seemed weaker than usual. Today, her daughter was concerned that she was dehydrated, checked her blood pressure which was low, and brought her to the ER.  She has had cough, NBNB vomiting, and non-bloody diarrhea, but no sputum, dyspnea, hemoptysis, urinary symptoms.  ED course: -Febrile to 102F, heart rate 123, respirations 18-25, blood pressure initially 73/54, pulse oximetry normal -Na 132, K 2.9, Cr 0.7 (baseline 0.5), WBC 14.1K, Hgb 7.1 and borderline macrocytic -INR 1.09 -Alcohol negative -Influenza PCR positive -Lactate 6.93 -Chest x-ray showed no focal opacity -Blood cultures were obtained, empiric vancomycin, Levaquin, Azactam, and Tamiflu were administered, and 2 units of blood were prepared for transfusion -The case was discussed with CCM, who felt that she was appropriate for stepdown management given her improvement in MAP to >70 with IV fluids, so TRH was called     Per daughter, the patient had a CT scan recently by one of her physicians because of an abnormal CXR.  This showed no lung mass, but the suggestion of liver cirrhosis, which she is choosing not to work up.  Patient states she has 2-3 drinks per day, daughter says more like 10.        ROS: Review of Systems  Constitutional: Positive for diaphoresis, fever and malaise/fatigue.  Respiratory: Positive for cough.   Gastrointestinal: Positive for diarrhea and vomiting.    Musculoskeletal: Positive for falls (chronic).  Neurological: Positive for weakness.  All other systems reviewed and are negative.         Past Medical History:  Diagnosis Date  . Pneumonia     Past Surgical History:  Procedure Laterality Date  . COLON RESECTION     perforation  . COLOSTOMY      Social History: Patient lives alone.  The patient walks with a walker, falls frequently, and so therefore has 24 hour supervision.  She is from [place] [facility]  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  She still smokes.  Drinks >10 spirits drinks per day.  Denies history of withdrawals.  Allergies  Allergen Reactions  . Ciprofloxacin Rash  . Levofloxacin Rash    Rash on back after 5 days therapy.   . Nickel Rash    Anything with nickel in it.   Marland Kitchen Penicillins Rash    Has patient had a PCN reaction causing immediate rash, facial/tongue/throat swelling, SOB or lightheadedness with hypotension: No Has patient had a PCN reaction causing severe rash involving mucus membranes or skin necrosis: No Ha patient had a PCN reaction that required hospitalization No Has patient had a PCN reaction occurring within the last 10 years: No If all of the above answers are "NO", then may proceed with Cephalosporin use. Had simple rash around age 55, needed no further care      Family history: family history includes Alzheimer's disease in her mother; Cancer in her sister; Cirrhosis in her father; Hemochromatosis in her other.  Prior to Admission medications   None       Physical Exam: BP (!) 75/50 (BP Location: Left Arm)   Pulse 118   Temp 101.5 F (38.6 C) (Oral)   Resp 25   Ht 5\' 4"  (1.626 m)   Wt 61.2 kg (135 lb)   SpO2 92%   BMI 23.17 kg/m  General appearance: Thin adult female, appears debilitated, some loss of subQ fat, alert and in moderate distress from malaise, weakness.   Eyes: Anicteric, conjunctiva pink, lids and lashes normal. PERRL.    ENT: No nasal deformity,  discharge, epistaxis.  Hearing normal. OP dry without lesions.   Neck: No neck masses.  Trachea midline.  No thyromegaly/tenderness. Lymph: No cervical or supraclavicular lymphadenopathy. Skin: Warm and dry.  No jaundice.  No suspicious rashes or lesions. Cardiac: Tachcyaric, regular, nl S1-S2, no murmurs appreciated.  Capillary refill is brisk without mottling.  JVP not visible.  No LE edema.  Radial and DP pulses 2+ and symmetric. Respiratory: Normal respiratory rate and rhythm.  Scattered wheezes present, I do not appreciate rales or focal diminished sounds. Abdomen: Abdomen soft.  No TTP. No ascites, distension, hepatosplenomegaly.   MSK: No joint deformities or effusions.  No cyanosis or clubbing but nail deformity is present. Neuro: Cranial nerves grossly symmetric.  Sensation intact to light touch. Speech is fluent.  Muscle strength globally very weak.    Psych: Sensorium intact and responding to questions, attention normal.  Behavior appropriate.  Affect blunted.  Judgment and insight appear somewhat normal.     Labs on Admission:  I have personally reviewed following labs and imaging studies: CBC:  Recent Labs Lab 05/21/16 2055  WBC 14.1*  NEUTROABS 12.4*  HGB 7.1*  HCT 19.8*  MCV 99.5  PLT 380   Basic Metabolic Panel:  Recent Labs Lab 05/21/16 2055  NA 132*  K 2.9*  CL 92*  CO2 26  GLUCOSE 118*  BUN 8  CREATININE 0.70  CALCIUM 7.8*   GFR: Estimated Creatinine Clearance: 58.9 mL/min (by C-G formula based on SCr of 0.7 mg/dL).  Liver Function Tests:  Recent Labs Lab 05/21/16 2055  AST 205*  ALT 27  ALKPHOS 191*  BILITOT 1.3*  PROT 5.7*  ALBUMIN 2.2*   No results for input(s): LIPASE, AMYLASE in the last 168 hours. No results for input(s): AMMONIA in the last 168 hours. Coagulation Profile:  Recent Labs Lab 05/21/16 2055  INR 1.09   Cardiac Enzymes: No results for input(s): CKTOTAL, CKMB, CKMBINDEX, TROPONINI in the last 168 hours. BNP (last 3  results) No results for input(s): PROBNP in the last 8760 hours. HbA1C: No results for input(s): HGBA1C in the last 72 hours. CBG: No results for input(s): GLUCAP in the last 168 hours. Lipid Profile: No results for input(s): CHOL, HDL, LDLCALC, TRIG, CHOLHDL, LDLDIRECT in the last 72 hours. Thyroid Function Tests: No results for input(s): TSH, T4TOTAL, FREET4, T3FREE, THYROIDAB in the last 72 hours. Anemia Panel: No results for input(s): VITAMINB12, FOLATE, FERRITIN, TIBC, IRON, RETICCTPCT in the last 72 hours. Sepsis Labs: Lactic acid 6.93 Invalid input(s): PROCALCITONIN, LACTICIDVEN No results found for this or any previous visit (from the past 240 hour(s)).       Radiological Exams on Admission: Personally reviewed CXR shows no focal opacity: Dg Chest 2 View  Result Date: 05/21/2016 CLINICAL DATA:  Hypotension.  Vomiting. EXAM: CHEST  2 VIEW COMPARISON:  Chest radiograph April 11, 2016 and chest CT May 09, 2016 FINDINGS: There is  slight right mid lung and left base atelectatic change. No frank edema or consolidation. Heart size and pulmonary vascularity are normal. No adenopathy. No bone lesions. IMPRESSION: Slight right midlung and left base atelectatic change. No edema or consolidation. Electronically Signed   By: Bretta Bang III M.D.   On: 05/21/2016 20:20    EKG: Independently reviewed. Rate 120, QTc 475, incomplete RBBB old.  Echocardiogram 2017: EF 60-65% PAP 56         Assessment/Plan Principal Problem:   Sepsis, unspecified organism [facility]) Active Problems:   Hyponatremia   Hypokalemia   Alcohol use (HCC), daily 2-3 drinks of vodka   Malnutrition of moderate degree   Elevated LFTs   Macrocytic anemia  1. Sepsis, suspected pneumonia, influenza A:  Suspected source unclear: seems OOP for influenza alone, but may be, will cover for CAP as well.  Patient meets criteria given tachycardia, tachypnea, fever, leukocytosis, and evidence of organ  dysfunction.  Lactate 6.93 mmol/L and repeat ordered within 6 hours.  This patient is at high risk of poor outcomes with a qSOFA score of 2.  Antibiotics delivered in the ED.    -Sepsis bundle utilized:  -Blood cultures drawn  -Obtain urine and sputum cultures  -30 ml/kg bolus given in ED, will repeat lactic acid  -Antibiotics: ceftriaxone, azithromycin and Tamiflu  -Repeat renal function and complete blood count in AM  -Follow urine pneumonia antigens and procalcitonin and de-escalate as able  -Code SEPSIS called to E-link       2. Hyponatremia:  Hypovolemic. -Urine sodium, osmolality -Fluids and trend BMP  3. Hypokalemia:  Alcohol contributing, influenza as well. -Check magnesium -IVF with K -Oral K as well  4. Elevated LFTs:  Daughter suggests patient's father had cryptogenic cirrhosis and patient had recent imaging with some suggestion of cirrhosis.  Albumin slightly low, INR normal, platelets normal.  Doubt cholangitis on exam. -Trend CMP -RUQ [place] ordered  5. Alcoholism:  Encouraged cessation, patient pre-contemplative. -CIWA scoring ordered -Given hemodynamic compromise, will hold lorazepam unless patient scoring very high -Thiamine and folate and MVI  6. Anemia:  This is normocytic to macrocytic.  No story to suggest bleeding.  Maybe combination of macrocytic anemia from B12/folate def and bone marrow suppression from alcohol.  Certainly contributing to shock physiology. -Check reticulocytes -Check B12, folate -Check iron stores -Transfuse 2 units now -Trend CBC       DVT prophylaxis: Lovenox for now Code Status: FULL  Family Communication: Daughter at bedside  Disposition Plan: Anticipate IV fluids, empiric antibiotics, Tamiflu, supportive cares.  Transfuse bloood now. Consults called: CCM, will be available if needed Admission status: INPATIENT    Medical decision making: Patient seen at 10:45 PM on 05/21/2016.  The patient was discussed with Dr. Michel Santee.   What exists of the patient's chart was reviewed in depth and summarized above.  Clinical condition: borderline hypotensive and still tachycardic, but MAP >70 after 30 cc/kg bolus.  Close monitoring in stepdown, risk for deterioration, patient would want escalation of care to central line, pressors.        Alberteen Sam [facility] Pager [phone]      At the time of admission, it appears that the appropriate admission status for this patient is INPATIENT. This is judged to be reasonable and necessary in order to provide the required intensity of service to ensure the patient's safety given the presenting symptoms, physical exam findings, and initial radiographic and laboratory data in the context of their chronic comorbidities.  Together, these circumstances are felt to place her at high risk for further clinical deterioration threatening life, limb, or organ.   Patient requires inpatient status due to high intensity of service, high risk for further deterioration and high frequency of surveillance required because of this acute illness that poses a threat to life, limb or bodily function.  Factors that support inpatient status include presentation with influenza infection, hypotension with initial MAP <65, tachycardia > 120 bpm, fever, severe anemia requiring transfusion, and multiple organ failure and multiple electrolyte abnormalities.  I certify that at the point of admission it is my clinical judgment that the patient will require inpatient hospital care spanning beyond 2 midnights from the point of admission and that early discharge would result in unnecessary risk of decompensation and readmission or threat to life, limb or bodily function.

## 2016-05-22 ENCOUNTER — Inpatient Hospital Stay (HOSPITAL_COMMUNITY): Payer: Medicare Other

## 2016-05-22 DIAGNOSIS — E440 Moderate protein-calorie malnutrition: Secondary | ICD-10-CM

## 2016-05-22 DIAGNOSIS — D539 Nutritional anemia, unspecified: Secondary | ICD-10-CM

## 2016-05-22 DIAGNOSIS — E876 Hypokalemia: Secondary | ICD-10-CM

## 2016-05-22 DIAGNOSIS — R7989 Other specified abnormal findings of blood chemistry: Secondary | ICD-10-CM

## 2016-05-22 DIAGNOSIS — Z789 Other specified health status: Secondary | ICD-10-CM

## 2016-05-22 DIAGNOSIS — E871 Hypo-osmolality and hyponatremia: Secondary | ICD-10-CM

## 2016-05-22 DIAGNOSIS — A419 Sepsis, unspecified organism: Principal | ICD-10-CM

## 2016-05-22 DIAGNOSIS — J101 Influenza due to other identified influenza virus with other respiratory manifestations: Secondary | ICD-10-CM

## 2016-05-22 LAB — CBC
HCT: 29.4 % — ABNORMAL LOW (ref 36.0–46.0)
Hemoglobin: 10.4 g/dL — ABNORMAL LOW (ref 12.0–15.0)
MCH: 36.1 pg — AB (ref 26.0–34.0)
MCHC: 35.4 g/dL (ref 30.0–36.0)
MCV: 102.1 fL — AB (ref 78.0–100.0)
PLATELETS: 273 10*3/uL (ref 150–400)
RBC: 2.88 MIL/uL — ABNORMAL LOW (ref 3.87–5.11)
RDW: 15 % (ref 11.5–15.5)
WBC: 9.6 10*3/uL (ref 4.0–10.5)

## 2016-05-22 LAB — PREPARE RBC (CROSSMATCH)

## 2016-05-22 LAB — VITAMIN B12: Vitamin B-12: 282 pg/mL (ref 180–914)

## 2016-05-22 LAB — LACTIC ACID, PLASMA
LACTIC ACID, VENOUS: 4.2 mmol/L — AB (ref 0.5–1.9)
LACTIC ACID, VENOUS: 1.4 mmol/L (ref 0.5–1.9)
Lactic Acid, Venous: 5.6 mmol/L (ref 0.5–1.9)

## 2016-05-22 LAB — URINALYSIS, ROUTINE W REFLEX MICROSCOPIC
Glucose, UA: NEGATIVE mg/dL
Ketones, ur: NEGATIVE mg/dL
NITRITE: NEGATIVE
Protein, ur: NEGATIVE mg/dL
SPECIFIC GRAVITY, URINE: 1.02 (ref 1.005–1.030)
pH: 5 (ref 5.0–8.0)

## 2016-05-22 LAB — COMPREHENSIVE METABOLIC PANEL
ALBUMIN: 1.8 g/dL — AB (ref 3.5–5.0)
ALK PHOS: 165 U/L — AB (ref 38–126)
ALT: 25 U/L (ref 14–54)
AST: 197 U/L — AB (ref 15–41)
Anion gap: 12 (ref 5–15)
BUN: 8 mg/dL (ref 6–20)
CO2: 23 mmol/L (ref 22–32)
CREATININE: 0.72 mg/dL (ref 0.44–1.00)
Calcium: 7.1 mg/dL — ABNORMAL LOW (ref 8.9–10.3)
Chloride: 96 mmol/L — ABNORMAL LOW (ref 101–111)
GFR calc Af Amer: >60 mL/min (ref >60)
GFR calc non Af Amer: >60 mL/min (ref >60)
GLUCOSE: 103 mg/dL — AB (ref 65–99)
Potassium: 2.8 mmol/L — ABNORMAL LOW (ref 3.5–5.1)
SODIUM: 131 mmol/L — AB (ref 135–145)
Total Bilirubin: 1.4 mg/dL — ABNORMAL HIGH (ref 0.3–1.2)
Total Protein: 5 g/dL — ABNORMAL LOW (ref 6.5–8.1)

## 2016-05-22 LAB — POTASSIUM: POTASSIUM: 4.4 mmol/L (ref 3.5–5.1)

## 2016-05-22 LAB — RETICULOCYTES
RBC.: 2.88 MIL/uL — AB (ref 3.87–5.11)
RETIC COUNT ABSOLUTE: 57.6 10*3/uL (ref 19.0–186.0)
RETIC CT PCT: 2 % (ref 0.4–3.1)

## 2016-05-22 LAB — PROCALCITONIN: Procalcitonin: 0.65 ng/mL

## 2016-05-22 LAB — OSMOLALITY: Osmolality: 269 mOsm/kg — ABNORMAL LOW (ref 275–295)

## 2016-05-22 LAB — IRON AND TIBC: IRON: 49 ug/dL (ref 28–170)

## 2016-05-22 LAB — HEMOGLOBIN AND HEMATOCRIT, BLOOD
HCT: 31.3 % — ABNORMAL LOW (ref 36.0–46.0)
Hemoglobin: 11.1 g/dL — ABNORMAL LOW (ref 12.0–15.0)

## 2016-05-22 LAB — OSMOLALITY, URINE: OSMOLALITY UR: 429 mosm/kg (ref 300–900)

## 2016-05-22 LAB — STREP PNEUMONIAE URINARY ANTIGEN: STREP PNEUMO URINARY ANTIGEN: NEGATIVE

## 2016-05-22 LAB — MRSA PCR SCREENING: MRSA by PCR: NEGATIVE

## 2016-05-22 LAB — FERRITIN: Ferritin: 1175 ng/mL — ABNORMAL HIGH (ref 11–307)

## 2016-05-22 LAB — MAGNESIUM: Magnesium: 0.9 mg/dL — CL (ref 1.7–2.4)

## 2016-05-22 LAB — ABO/RH: ABO/RH(D): A POS

## 2016-05-22 LAB — SODIUM, URINE, RANDOM: Sodium, Ur: 42 mmol/L

## 2016-05-22 MED ORDER — ONDANSETRON HCL 4 MG/2ML IJ SOLN: 4.0000 mg | Freq: Four times a day (QID) | INTRAMUSCULAR | Status: DC | PRN

## 2016-05-22 MED ORDER — DEXTROSE 5 % IV SOLN
500.0000 mg | Freq: Every day | INTRAVENOUS | Status: DC
  Administered 2016-05-22 – 2016-05-26 (×5): 500 mg via INTRAVENOUS
  Filled 2016-05-22 (×6): qty 500

## 2016-05-22 MED ORDER — SODIUM CHLORIDE 0.9 % IV BOLUS (SEPSIS): 1000.0000 mL | Freq: Once | INTRAVENOUS | Status: DC

## 2016-05-22 MED ORDER — VITAMIN B-1 100 MG PO TABS
100.0000 mg | ORAL_TABLET | Freq: Every day | ORAL | Status: DC
  Administered 2016-05-23 – 2016-05-27 (×5): 100 mg via ORAL
  Filled 2016-05-22 (×5): qty 1

## 2016-05-22 MED ORDER — ENOXAPARIN SODIUM 40 MG/0.4ML ~~LOC~~ SOLN
40.0000 mg | Freq: Every day | SUBCUTANEOUS | Status: DC
  Administered 2016-05-22 – 2016-05-26 (×6): 40 mg via SUBCUTANEOUS
  Filled 2016-05-22 (×6): qty 0.4

## 2016-05-22 MED ORDER — SODIUM CHLORIDE 0.9 % IV BOLUS (SEPSIS)
1000.0000 mL | Freq: Once | INTRAVENOUS | Status: AC
  Administered 2016-05-22: 1000 mL via INTRAVENOUS

## 2016-05-22 MED ORDER — ONDANSETRON HCL 4 MG PO TABS: 4.0000 mg | ORAL_TABLET | Freq: Four times a day (QID) | ORAL | Status: DC | PRN

## 2016-05-22 MED ORDER — ORAL CARE MOUTH RINSE
15.0000 mL | Freq: Two times a day (BID) | OROMUCOSAL | Status: DC
  Administered 2016-05-22 – 2016-05-27 (×6): 15 mL via OROMUCOSAL

## 2016-05-22 MED ORDER — MAGNESIUM SULFATE 2 GM/50ML IV SOLN
2.0000 g | Freq: Once | INTRAVENOUS | Status: AC
  Administered 2016-05-22: 2 g via INTRAVENOUS
  Filled 2016-05-22: qty 50

## 2016-05-22 MED ORDER — POTASSIUM CHLORIDE CRYS ER 20 MEQ PO TBCR
40.0000 meq | EXTENDED_RELEASE_TABLET | Freq: Two times a day (BID) | ORAL | Status: DC
  Administered 2016-05-22: 40 meq via ORAL
  Filled 2016-05-22: qty 2

## 2016-05-22 MED ORDER — DEXTROSE 5 % IV SOLN
1.0000 g | Freq: Every day | INTRAVENOUS | Status: DC
  Administered 2016-05-22 – 2016-05-26 (×6): 1 g via INTRAVENOUS
  Filled 2016-05-22 (×7): qty 10

## 2016-05-22 MED ORDER — ADULT MULTIVITAMIN W/MINERALS CH
1.0000 | ORAL_TABLET | Freq: Every day | ORAL | Status: DC
  Administered 2016-05-22 – 2016-05-26 (×5): 1 via ORAL
  Filled 2016-05-22 (×6): qty 1

## 2016-05-22 MED ORDER — THIAMINE HCL 100 MG/ML IJ SOLN
100.0000 mg | Freq: Every day | INTRAMUSCULAR | Status: DC
  Administered 2016-05-22: 100 mg via INTRAVENOUS
  Filled 2016-05-22: qty 2

## 2016-05-22 MED ORDER — MAGNESIUM SULFATE 4 GM/100ML IV SOLN
4.0000 g | Freq: Once | INTRAVENOUS | Status: AC
  Administered 2016-05-22: 4 g via INTRAVENOUS
  Filled 2016-05-22: qty 100

## 2016-05-22 MED ORDER — POTASSIUM CHLORIDE IN NACL 40-0.9 MEQ/L-% IV SOLN
INTRAVENOUS | Status: DC
  Administered 2016-05-22 (×3): 125 mL/h via INTRAVENOUS
  Filled 2016-05-22 (×4): qty 1000

## 2016-05-22 MED ORDER — ENSURE ENLIVE PO LIQD: 237.0000 mL | Freq: Two times a day (BID) | ORAL | Status: DC

## 2016-05-22 MED ORDER — MAGNESIUM SULFATE 4 GM/100ML IV SOLN
4.0000 g | Freq: Once | INTRAVENOUS | Status: DC
Start: 2016-05-22 — End: 2016-05-22

## 2016-05-22 MED ORDER — ACETAMINOPHEN 650 MG RE SUPP
650.0000 mg | RECTAL | Status: DC | PRN
  Administered 2016-05-22: 650 mg via RECTAL
  Filled 2016-05-22: qty 1

## 2016-05-22 MED ORDER — POTASSIUM CHLORIDE CRYS ER 20 MEQ PO TBCR
40.0000 meq | EXTENDED_RELEASE_TABLET | Freq: Four times a day (QID) | ORAL | Status: AC
  Administered 2016-05-22 (×2): 40 meq via ORAL
  Filled 2016-05-22 (×2): qty 2

## 2016-05-22 MED ORDER — UNJURY CHICKEN SOUP POWDER
8.0000 [oz_av] | Freq: Four times a day (QID) | ORAL | Status: DC
  Administered 2016-05-25 – 2016-05-26 (×2): 8 [oz_av] via ORAL
  Filled 2016-05-22 (×21): qty 27

## 2016-05-22 MED ORDER — FOLIC ACID 1 MG PO TABS
1.0000 mg | ORAL_TABLET | Freq: Every day | ORAL | Status: DC
  Administered 2016-05-22 – 2016-05-26 (×5): 1 mg via ORAL
  Filled 2016-05-22 (×6): qty 1

## 2016-05-22 NOTE — Progress Notes (Signed)
Initial Nutrition Assessment  DOCUMENTATION CODES:   Not applicable  INTERVENTION:  - Will d/c Ensure Enlive per request.  - Will order Unjury chicken soup TID, each supplement provides 100 kcal and 21 grams of protein. - Continue to encourage PO intakes of meals and supplements. - RD will continue to monitor for additional needs. - Monitor magnesium, potassium, and phosphorus daily for at least 3 days, MD to replete as needed, as pt is at risk for refeeding syndrome given hx of alcoholism and current severe hypokalemia and severe hypomagnesemia.   NUTRITION DIAGNOSIS:   Inadequate oral intake related to poor appetite as evidenced by per patient/family report.  GOAL:   Patient will meet greater than or equal to 90% of their needs  MONITOR:   PO intake, Supplement acceptance, Weight trends, Labs, I & O's  REASON FOR ASSESSMENT:   Malnutrition Screening Tool  ASSESSMENT:   66 y.o. female with no significant past medical history except a perforated viscus a few years ago, repaired and alcoholism who presents with influenza like illness. The patient was in her usual state of health until the last few days, when her daughter noticed that she was not eating, drinking, and seemed weaker than usual. Today, her daughter was concerned that she was dehydrated, checked her blood pressure which was low, and brought her to the ER.  She has had cough, NBNB vomiting, and non-bloody diarrhea, but no sputum, dyspnea, hemoptysis, urinary symptoms.  Pt seen for MST. BMI indicates normal weight. No intakes documented since admission. Caregiver at bedside at the time of RD visit and was reading the menu to pt who was uninterested in anything being ordered for lunch at that time. Pt states that she is not feeling hungry and that she has not had a good appetite. She denies abdominal pain or nausea now or PTA; noted signs/symptoms from PTA listed in notes. Pt was very drowsy so the majority of information  was provided by the caregiver who has been with the pt since December.   The pt's status has mainly been stable over the past 2 months but prior to that she was declining in many areas, based on information provided by the daughter to the caregiver. Pt only takes bites at meal times and enjoys things such as mashed potatoes and meatloaf or chicken parmesan with spaghetti. She sometimes snacks on items such as oranges, crackers, cheese, popcorn, and pretzels. RN reports pt does not like Ensure but that daughter had stated pt enjoyed Unjury chicken soup during a previous admission.   The pt does not seem to have much difficulty in chewing but she does take a prolonged amount of time to swallow; this has not worsened recently. She does not complain of pain or items being caught in her throat with swallowing. She does not have any coughing with swallowing. Pt had difficulty swallowing applesauce this AM. Difficulty with swallowing began ~1 year ago and this is when PO intakes began to decline.   Per rounds, pt with hx of alcoholism and was drinking 10 vodka drinks/day.   Physical assessment not performed at this time to respect pt's desire to rest; will complete at follow-up. Per chart review, pt has lost 8 lbs (5% body weight) in the past 2 months which is not significant for time frame. Unable to state malnutrition at this time but will update as warranted.  Medications reviewed; 1 mg oral folic acid/day, 2 g IV Mg sulfate x1 dose today, 4 g IV Mg  sulfate x1 dose today, daily multivitamin with minerals, PRN Zofran, 40 mEq oral KCl QID, 100 mg oral thiamine/day.  Labs reviewed; Na: 131 mmol/L, K: 2.8 mg/dL, Mg: 0.9 mg/dL, Ca: 7.1 mg/dL, Cl: 96 mmol/L, Alk Phos and AST elevated.   IVF: NS-40 mEq KCl @ 125 mL/hr.    Diet Order:  Diet regular Room service appropriate? Yes; Fluid consistency: Thin  Skin:  Reviewed, no issues  Last BM:  2/14  Height:   Ht Readings from Last 1 Encounters:  05/21/16  '5\' 4"'  (1.626 m)    Weight:   Wt Readings from Last 1 Encounters:  05/21/16 138 lb 0.1 oz (62.6 kg)    Ideal Body Weight:  54.54 kg  BMI:  Body mass index is 23.69 kg/m.  Estimated Nutritional Needs:   Kcal:  1375-1565 (22-25 kcal/kg)  Protein:  63-75 grams (1-1.2 grams/kg)  Fluid:  >/= 1.5 L/day  EDUCATION NEEDS:   No education needs identified at this time    Jarome Matin, MS, RD, LDN, CNSC Inpatient Clinical Dietitian Pager # [phone] After hours/weekend pager # [phone]

## 2016-05-22 NOTE — Progress Notes (Signed)
CRITICAL VALUE ALERT   Critical value received:  LA 4.2  Date of notification:  05/22/16  Time of notification:  0503  Critical value read back:Yes.    Nurse who received alert:  Rutha BouchardShelby Rameses Ou, RN  MD notified (1st page):  Merdis DelayK. Schorr, NP  Time of first page:  [phone]  MD notified (2nd page):  Time of second page:  Responding MD:  Merdis DelayK. Schorr, NP and Dr. Belia HemanKasa  Time MD responded:  [phone]

## 2016-05-22 NOTE — Progress Notes (Signed)
eLink Physician-Brief Progress Note Patient Name: Rachel JewelsSusan M Williams DOB: 1950/09/14 MRN: [id number]   Date of Service  05/22/2016  HPI/Events of Note  Low BP but LA improving, mentating well  eICU Interventions  Will give one more liter IVF;s and patient to get more unit PRBC's as well     Intervention Category Major Interventions: Hypotension - evaluation and management  Rachel Williams 05/22/2016, 5:23 AM

## 2016-05-22 NOTE — Progress Notes (Signed)
PROGRESS NOTE  Rachel Williams  [id number] DOB: [birth year] DOA: 05/21/2016 PCP: Neldon LabellaMILLER,LISA LYNN, MD Outpatient Specialists:  Subjective: Seen with daughter and caregiver at bedside, patient is sleepy this morning but easy to arouse. Denies any shortness of breath or other symptoms, blood pressure is in the low side.  Brief Narrative:  Rachel Williams is a 66 y.o. female with no significant past medical history except a perforated viscus a few years ago, repaired and alcoholism who presents with influenza like illness. The patient was in her usual state of health until the last few days, when her daughter noticed that she was not eating, drinking, and seemed weaker than usual. Today, her daughter was concerned that she was dehydrated, checked her blood pressure which was low, and brought her to the ER.  She has had cough, NBNB vomiting, and non-bloody diarrhea, but no sputum, dyspnea, hemoptysis, urinary symptoms.  Assessment & Plan:   Principal Problem:   Sepsis, unspecified organism (HCC) Active Problems:   Hyponatremia   Hypokalemia   Alcohol use (HCC), daily 2-3 drinks of vodka   Malnutrition of moderate degree   Elevated LFTs   Macrocytic anemia   Influenza A   Sepsis, suspected pneumonia, influenza A:  -Presented with a temperature of 102.3 and heart rate of 1042 and suspected infection. -Has influenza type A, likely she has UTI as well. -His BP went down to the 70s, discussed with daughter at bedside reported blood pressure at home is in the 80s. -Given 8 L of IV fluids till now, will hold on vasopressors. -Started on Rocephin, Zithromax and Tamiflu, continued.  UTI -UA showed TNTC WBC, patient is on Rocephin, will continue. -Urine and blood cultures pending.  Hyponatremia:  -Sodium of 132 on admission, records reviewed this appeared to be chronic. -Started on IV fluid, follow daily.  Hypokalemia:  -Likely hyperkaliuria secondary to alcoholism -This is treated with  oral and IV supplements, replete magnesium as well.  Hypomagnesemia -Magnesium is 0.9, replete with parenteral supplements, check magnesium in a.m.  Elevated LFTs:  -Daughter suggests patient's father had cryptogenic cirrhosis and patient had recent imaging with some suggestion of cirrhosis.  Albumin slightly low, INR normal, platelets normal.  Doubt cholangitis on exam. -Follow LFTs trend and check renal ultrasound. This is could be secondary to alcohol, AST/ALT ratio is suggesting alcoholism.  Alcoholism:  Encouraged cessation, patient pre-contemplative. -Started on CIWA protocol, but no Ativan for now as blood pressure is in the low side. -Per daughter and caregiver at bedside she consumes about 6-7 shots of vodka per day.  Anemia:  This is normocytic to macrocytic.  No story to suggest bleeding.  Maybe combination of macrocytic anemia from B12/folate def and bone marrow suppression from alcohol.  Certainly contributing to shock physiology. -Status post transfusion of 2 units of packed RBCs, hemoglobin is 11.1.   DVT prophylaxis: SQ Heparin Code Status: Full Code Family Communication:  Disposition Plan:  Diet: Diet regular Room service appropriate? Yes; Fluid consistency: Thin  Consultants:   None  Procedures:   None  Antimicrobials:  -Ceftriaxone, azithromycin and Tamiflu  Objective: Vitals:   05/22/16 0800 05/22/16 1000 05/22/16 1100 05/22/16 1200  BP: (!) 98/52 (!) 73/40 (!) 74/43 (!) 95/46  Pulse: 98 95 98 100  Resp: 18 18 18 11   Temp: 99.2 F (37.3 C)     TempSrc: Oral     SpO2: 94% 94% 97% 97%  Weight:      Height:  Intake/Output Summary (Last 24 hours) at 05/22/16 1205 Last data filed at 05/22/16 1025  Gross per 24 hour  Intake          9532.33 ml  Output               25 ml  Net          9507.33 ml   Filed Weights   05/21/16 1921 05/21/16 2330  Weight: 61.2 kg (135 lb) 62.6 kg (138 lb 0.1 oz)    Examination: General exam: Appears  calm and comfortable  Respiratory system: Clear to auscultation. Respiratory effort normal. Cardiovascular system: S1 & S2 heard, RRR. No JVD, murmurs, rubs, gallops or clicks. No pedal edema. Gastrointestinal system: Abdomen is nondistended, soft and nontender. No organomegaly or masses felt. Normal bowel sounds heard. Central nervous system: Alert and oriented. No focal neurological deficits. Extremities: Symmetric 5 x 5 power. Skin: No rashes, lesions or ulcers Psychiatry: Judgement and insight appear normal. Mood & affect appropriate.   Data Reviewed: I have personally reviewed following labs and imaging studies  CBC:  Recent Labs Lab 05/21/16 2055 05/22/16 0055 05/22/16 1141  WBC 14.1* 9.6  --   NEUTROABS 12.4*  --   --   HGB 7.1* 10.4* 11.1*  HCT 19.8* 29.4* 31.3*  MCV 99.5 102.1*  --   PLT 380 273  --    Basic Metabolic Panel:  Recent Labs Lab 05/21/16 2055 05/22/16 0050 05/22/16 0055  NA 132*  --  131*  K 2.9*  --  2.8*  CL 92*  --  96*  CO2 26  --  23  GLUCOSE 118*  --  103*  BUN 8  --  8  CREATININE 0.70  --  0.72  CALCIUM 7.8*  --  7.1*  MG  --  0.9*  --    GFR: Estimated Creatinine Clearance: 58.9 mL/min (by C-G formula based on SCr of 0.72 mg/dL). Liver Function Tests:  Recent Labs Lab 05/21/16 2055 05/22/16 0055  AST 205* 197*  ALT 27 25  ALKPHOS 191* 165*  BILITOT 1.3* 1.4*  PROT 5.7* 5.0*  ALBUMIN 2.2* 1.8*   No results for input(s): LIPASE, AMYLASE in the last 168 hours. No results for input(s): AMMONIA in the last 168 hours. Coagulation Profile:  Recent Labs Lab 05/21/16 2055  INR 1.09   Cardiac Enzymes: No results for input(s): CKTOTAL, CKMB, CKMBINDEX, TROPONINI in the last 168 hours. BNP (last 3 results) No results for input(s): PROBNP in the last 8760 hours. HbA1C: No results for input(s): HGBA1C in the last 72 hours. CBG: No results for input(s): GLUCAP in the last 168 hours. Lipid Profile: No results for input(s):  CHOL, HDL, LDLCALC, TRIG, CHOLHDL, LDLDIRECT in the last 72 hours. Thyroid Function Tests: No results for input(s): TSH, T4TOTAL, FREET4, T3FREE, THYROIDAB in the last 72 hours. Anemia Panel:  Recent Labs  05/22/16 0055  RETICCTPCT 2.0   Urine analysis:    Component Value Date/Time   COLORURINE AMBER (A) 05/22/2016 1025   APPEARANCEUR CLOUDY (A) 05/22/2016 1025   LABSPEC 1.020 05/22/2016 1025   PHURINE 5.0 05/22/2016 1025   GLUCOSEU NEGATIVE 05/22/2016 1025   HGBUR MODERATE (A) 05/22/2016 1025   BILIRUBINUR SMALL (A) 05/22/2016 1025   KETONESUR NEGATIVE 05/22/2016 1025   PROTEINUR NEGATIVE 05/22/2016 1025   UROBILINOGEN 1.0 01/11/2010 1611   NITRITE NEGATIVE 05/22/2016 1025   LEUKOCYTESUR MODERATE (A) 05/22/2016 1025   Sepsis Labs: @LABRCNTIP (procalcitonin:4,lacticidven:4)  ) Recent Results (from the past  240 hour(s))  MRSA PCR Screening     Status: None   Collection Time: 05/22/16 12:30 AM  Result Value Ref Range Status   MRSA by PCR NEGATIVE NEGATIVE Final    Comment:        The GeneXpert MRSA Assay (FDA approved for NASAL specimens only), is one component of a comprehensive MRSA colonization surveillance program. It is not intended to diagnose MRSA infection nor to guide or monitor treatment for MRSA infections.      Invalid input(s): PROCALCITONIN, LACTICACIDVEN   Radiology Studies: Dg Chest 2 View  Result Date: 05/21/2016 CLINICAL DATA:  Hypotension.  Vomiting. EXAM: CHEST  2 VIEW COMPARISON:  Chest radiograph April 11, 2016 and chest CT May 09, 2016 FINDINGS: There is slight right mid lung and left base atelectatic change. No frank edema or consolidation. Heart size and pulmonary vascularity are normal. No adenopathy. No bone lesions. IMPRESSION: Slight right midlung and left base atelectatic change. No edema or consolidation. Electronically Signed   By: Bretta Bang III M.D.   On: 05/21/2016 20:20   US Abdomen Limited Ruq  Result Date:  05/22/2016 CLINICAL DATA:  Pneumonia. Elevated liver function studies. Dehydration for 2 days. EXAM: US ABDOMEN LIMITED - RIGHT UPPER QUADRANT COMPARISON:  None. FINDINGS: Gallbladder: Prominent fold along the mid body of the gallbladder. No stones or sludge identified. Mild gallbladder wall thickening is nonspecific. No edema. Murphy's sign is negative. Common bile duct: Diameter: 4.7 mm, normal Liver: Diffusely increased echotexture throughout the liver suggesting fatty infiltration. IMPRESSION: Mild gallbladder wall thickening is nonspecific. No cholelithiasis or other evidence of cholecystitis. Diffuse fatty infiltration of the liver. Electronically Signed   By: Burman Nieves M.D.   On: 05/22/2016 01:41        Scheduled Meds: . sodium chloride  10 mL/hr Intravenous Once  . acetaminophen  650 mg Oral Once  . azithromycin  500 mg Intravenous QHS  . cefTRIAXone (ROCEPHIN)  IV  1 g Intravenous QHS  . enoxaparin (LOVENOX) injection  40 mg Subcutaneous QHS  . feeding supplement (ENSURE ENLIVE)  237 mL Oral BID BM  . folic acid  1 mg Oral Daily  . mouth rinse  15 mL Mouth Rinse BID  . multivitamin with minerals  1 tablet Oral Daily  . oseltamivir  75 mg Oral BID  . potassium chloride  40 mEq Oral Q6H  . sodium chloride  1,000 mL Intravenous Once   And  . sodium chloride  1,000 mL Intravenous Once  . sodium chloride  1,000 mL Intravenous Once  . sodium chloride  1,000 mL Intravenous Once  . thiamine  100 mg Oral Daily   Or  . thiamine  100 mg Intravenous Daily   Continuous Infusions: . 0.9 % NaCl with KCl 40 mEq / L 125 mL/hr at 05/22/16 0500     LOS: 1 day    Time spent: 35 minutes    Julianna Vanwagner A, MD [facility] Pager [phone]  If 7PM-7AM, please contact night-coverage [email or web address] Password [facility] 05/22/2016, 12:05 PM

## 2016-05-22 NOTE — Progress Notes (Signed)
CRITICAL VALUE ALERT  Critical value received:  Mg 3.9; LA 5.6  Date of notification:  05/22/16  Time of notification:  0138  Critical value read back:Yes.    Nurse who received alert:  Rutha BouchardShelby Skylene Deremer, RN  MD notified (1st page):  Merdis DelayK. Schorr, NP  Time of first page:  0140  MD notified (2nd page):  Time of second page: 0215  Responding MD:  Merdis DelayK. Schorr, NP  Time MD responded:  [phone]

## 2016-05-23 LAB — TYPE AND SCREEN
BLOOD PRODUCT EXPIRATION DATE: 201803062359
BLOOD PRODUCT EXPIRATION DATE: 201803062359
ISSUE DATE / TIME: 201802140604
ISSUE DATE / TIME: 201802140421
UNIT TYPE AND RH: 6200
Unit Type and Rh: 6200

## 2016-05-23 LAB — BASIC METABOLIC PANEL
Anion gap: 3 — ABNORMAL LOW (ref 5–15)
BUN: 10 mg/dL (ref 6–20)
CALCIUM: 7.1 mg/dL — AB (ref 8.9–10.3)
CO2: 22 mmol/L (ref 22–32)
CREATININE: 0.48 mg/dL (ref 0.44–1.00)
Chloride: 109 mmol/L (ref 101–111)
GFR calc Af Amer: >60 mL/min (ref >60)
GFR calc non Af Amer: >60 mL/min (ref >60)
GLUCOSE: 76 mg/dL (ref 65–99)
Potassium: 5.5 mmol/L — ABNORMAL HIGH (ref 3.5–5.1)
Sodium: 134 mmol/L — ABNORMAL LOW (ref 135–145)

## 2016-05-23 LAB — CBC
HCT: 32.8 % — ABNORMAL LOW (ref 36.0–46.0)
Hemoglobin: 11.8 g/dL — ABNORMAL LOW (ref 12.0–15.0)
MCH: 34.6 pg — AB (ref 26.0–34.0)
MCHC: 36 g/dL (ref 30.0–36.0)
MCV: 96.2 fL (ref 78.0–100.0)
PLATELETS: 208 10*3/uL (ref 150–400)
RBC: 3.41 MIL/uL — ABNORMAL LOW (ref 3.87–5.11)
RDW: 18.4 % — ABNORMAL HIGH (ref 11.5–15.5)
WBC: 8.9 10*3/uL (ref 4.0–10.5)

## 2016-05-23 LAB — LEGIONELLA PNEUMOPHILA SEROGP 1 UR AG: L. pneumophila Serogp 1 Ur Ag: NEGATIVE

## 2016-05-23 LAB — FOLATE RBC
FOLATE, HEMOLYSATE: 275.7 ng/mL
Folate, RBC: 1069 ng/mL (ref >498)
HEMATOCRIT: 25.8 % — AB (ref 34.0–46.6)

## 2016-05-23 LAB — MAGNESIUM: Magnesium: 2 mg/dL (ref 1.7–2.4)

## 2016-05-23 LAB — PROCALCITONIN: PROCALCITONIN: 2.34 ng/mL

## 2016-05-23 MED ORDER — METOPROLOL TARTRATE 5 MG/5ML IV SOLN: 5.0000 mg | Freq: Four times a day (QID) | INTRAVENOUS | Status: DC | PRN

## 2016-05-23 NOTE — Progress Notes (Signed)
PROGRESS NOTE  Rachel Williams  WUJ:[id number] DOB: 1950-05-25 DOA: 05/21/2016 PCP: Neldon Labella, MD Outpatient Specialists:  Subjective: Seen with daughter and caregiver at bedside, patient more awake and alert today. Blood pressure improved, will is continue IV fluids as patient received about 12 L so far.  Brief Narrative:  Rachel Williams is a 66 y.o. female with no significant past medical history except a perforated viscus a few years ago, repaired and alcoholism who presents with influenza like illness. The patient was in her usual state of health until the last few days, when her daughter noticed that she was not eating, drinking, and seemed weaker than usual. Today, her daughter was concerned that she was dehydrated, checked her blood pressure which was low, and brought her to the ER.  She has had cough, NBNB vomiting, and non-bloody diarrhea, but no sputum, dyspnea, hemoptysis, urinary symptoms.  Assessment & Plan:   Principal Problem:   Sepsis, unspecified organism [facility]) Active Problems:   Hyponatremia   Hypokalemia   Alcohol use (HCC), daily 2-3 drinks of vodka   Malnutrition of moderate degree   Elevated LFTs   Macrocytic anemia   Influenza A   Sepsis, suspected pneumonia, influenza A:  -Presented with a temperature of 102.3 and heart rate of 1042 and suspected infection. -Has influenza type A, likely she has UTI as well. Procalcitonin is 2.34. -Lactic acid was 7 on admission, improved after aggressive hydration with IV fluids currently 1.4. -His BP went down to the 70s, discussed with daughter at bedside reported blood pressure at home is in the 80s. -Given 8 L of IV fluids till now, will hold on vasopressors. -Started on Rocephin, Zithromax and Tamiflu. Continue current antibiotics, discontinue IVF.  UTI -UA showed TNTC WBC, patient is on Rocephin, will continue. -Urine and blood cultures pending.  Hyponatremia:  -Sodium of 132 on admission, records reviewed this  appeared to be chronic. -Improved with IV fluids to 134.  Hypokalemia:  -Likely hyperkaliuria secondary to alcoholism -Repleted orally and parenterally, potassium is 5.5 today, follow BMP in a.m.  Hypomagnesemia -Magnesium is 0.9, repleted with parenteral supplements, magnesium is 2.0 this morning.  Elevated LFTs:  -Daughter suggests patient's father had cryptogenic cirrhosis and patient had recent imaging with some suggestion of cirrhosis.  Albumin slightly low, INR normal, platelets normal.  Doubt cholangitis on exam. -Follow LFTs trend and check renal ultrasound. This is could be secondary to alcohol, AST/ALT ratio is suggesting alcoholism.  Alcoholism:  Encouraged cessation, patient pre-contemplative. -Started on CIWA protocol, but no Ativan for now as blood pressure is in the low side. -Per daughter and caregiver at bedside she consumes about 6-7 shots of vodka per day.  Anemia:  This is normocytic to macrocytic.  No story to suggest bleeding.  Maybe combination of macrocytic anemia from B12/folate def and bone marrow suppression from alcohol.  Certainly contributing to shock physiology. -Status post transfusion of 2 units of packed RBCs, hemoglobin is 11.1.   DVT prophylaxis: SQ Heparin Code Status: Full Code Family Communication:  Disposition Plan:  Diet: Diet regular Room service appropriate? Yes; Fluid consistency: Thin  Consultants:   None  Procedures:   None  Antimicrobials:  -Ceftriaxone, azithromycin and Tamiflu  Objective: Vitals:   05/23/16 0500 05/23/16 0600 05/23/16 0752 05/23/16 0800  BP:  (!) 148/72 134/78   Pulse: 95 94 100   Resp: 20 17 (!) 21   Temp:    98.9 F (37.2 C)  TempSrc:    Oral  SpO2: 97% 96% 92%   Weight:      Height:        Intake/Output Summary (Last 24 hours) at 05/23/16 1152 Last data filed at 05/23/16 1100  Gross per 24 hour  Intake           2937.5 ml  Output                0 ml  Net           2937.5 ml   Filed  Weights   05/21/16 1921 05/21/16 2330  Weight: 61.2 kg (135 lb) 62.6 kg (138 lb 0.1 oz)    Examination: General exam: Appears calm and comfortable  Respiratory system: Clear to auscultation. Respiratory effort normal. Cardiovascular system: S1 & S2 heard, RRR. No JVD, murmurs, rubs, gallops or clicks. No pedal edema. Gastrointestinal system: Abdomen is nondistended, soft and nontender. No organomegaly or masses felt. Normal bowel sounds heard. Central nervous system: Alert and oriented. No focal neurological deficits. Extremities: Symmetric 5 x 5 power. Skin: No rashes, lesions or ulcers Psychiatry: Judgement and insight appear normal. Mood & affect appropriate.   Data Reviewed: I have personally reviewed following labs and imaging studies  CBC:  Recent Labs Lab 05/21/16 2055 05/22/16 0055 05/22/16 1141 05/23/16 0346  WBC 14.1* 9.6  --  8.9  NEUTROABS 12.4*  --   --   --   HGB 7.1* 10.4* 11.1* 11.8*  HCT 19.8* 29.4* 31.3* 32.8*  MCV 99.5 102.1*  --  96.2  PLT 380 273  --  208   Basic Metabolic Panel:  Recent Labs Lab 05/21/16 2055 05/22/16 0050 05/22/16 0055 05/22/16 1609 05/23/16 0346  NA 132*  --  131*  --  134*  K 2.9*  --  2.8* 4.4 5.5*  CL 92*  --  96*  --  109  CO2 26  --  23  --  22  GLUCOSE 118*  --  103*  --  76  BUN 8  --  8  --  10  CREATININE 0.70  --  0.72  --  0.48  CALCIUM 7.8*  --  7.1*  --  7.1*  MG  --  0.9*  --   --  2.0   GFR: Estimated Creatinine Clearance: 58.9 mL/min (by C-G formula based on SCr of 0.48 mg/dL). Liver Function Tests:  Recent Labs Lab 05/21/16 2055 05/22/16 0055  AST 205* 197*  ALT 27 25  ALKPHOS 191* 165*  BILITOT 1.3* 1.4*  PROT 5.7* 5.0*  ALBUMIN 2.2* 1.8*   No results for input(s): LIPASE, AMYLASE in the last 168 hours. No results for input(s): AMMONIA in the last 168 hours. Coagulation Profile:  Recent Labs Lab 05/21/16 2055  INR 1.09   Cardiac Enzymes: No results for input(s): CKTOTAL, CKMB,  CKMBINDEX, TROPONINI in the last 168 hours. BNP (last 3 results) No results for input(s): PROBNP in the last 8760 hours. HbA1C: No results for input(s): HGBA1C in the last 72 hours. CBG: No results for input(s): GLUCAP in the last 168 hours. Lipid Profile: No results for input(s): CHOL, HDL, LDLCALC, TRIG, CHOLHDL, LDLDIRECT in the last 72 hours. Thyroid Function Tests: No results for input(s): TSH, T4TOTAL, FREET4, T3FREE, THYROIDAB in the last 72 hours. Anemia Panel:  Recent Labs  05/22/16 0055 05/22/16 0425  VITAMINB12  --  282  FERRITIN  --  1,175*  TIBC  --  NOT CALCULATED  IRON  --  49  RETICCTPCT 2.0  --  Urine analysis:    Component Value Date/Time   COLORURINE AMBER (A) 05/22/2016 1025   APPEARANCEUR CLOUDY (A) 05/22/2016 1025   LABSPEC 1.020 05/22/2016 1025   PHURINE 5.0 05/22/2016 1025   GLUCOSEU NEGATIVE 05/22/2016 1025   HGBUR MODERATE (A) 05/22/2016 1025   BILIRUBINUR SMALL (A) 05/22/2016 1025   KETONESUR NEGATIVE 05/22/2016 1025   PROTEINUR NEGATIVE 05/22/2016 1025   UROBILINOGEN 1.0 01/11/2010 1611   NITRITE NEGATIVE 05/22/2016 1025   LEUKOCYTESUR MODERATE (A) 05/22/2016 1025   Sepsis Labs: @LABRCNTIP (procalcitonin:4,lacticidven:4)  ) Recent Results (from the past 240 hour(s))  Culture, blood (Routine x 2)     Status: None (Preliminary result)   Collection Time: 05/21/16  8:54 PM  Result Value Ref Range Status   Specimen Description LEFT ANTECUBITAL  Final   Special Requests BOTTLES DRAWN AEROBIC AND ANAEROBIC 5CC  Final   Culture   Final    NO GROWTH < 24 HOURS Performed at [facility], [address] [place], [place] [address]    Report Status PENDING  Incomplete  Culture, blood (Routine x 2)     Status: None (Preliminary result)   Collection Time: 05/21/16  9:20 PM  Result Value Ref Range Status   Specimen Description BLOOD LEFT FOREARM  Final   Special Requests BOTTLES DRAWN AEROBIC AND ANAEROBIC 5CC  Final   Culture   Final     NO GROWTH < 24 HOURS Performed at [facility], [address] [place], [place] [address]    Report Status PENDING  Incomplete  MRSA PCR Screening     Status: None   Collection Time: 05/22/16 12:30 AM  Result Value Ref Range Status   MRSA by PCR NEGATIVE NEGATIVE Final    Comment:        The GeneXpert MRSA Assay (FDA approved for NASAL specimens only), is one component of a comprehensive MRSA colonization surveillance program. It is not intended to diagnose MRSA infection nor to guide or monitor treatment for MRSA infections.      Invalid input(s): PROCALCITONIN, LACTICACIDVEN   Radiology Studies: Dg Chest 2 View  Result Date: 05/21/2016 CLINICAL DATA:  Hypotension.  Vomiting. EXAM: CHEST  2 VIEW COMPARISON:  Chest radiograph April 11, 2016 and chest CT May 09, 2016 FINDINGS: There is slight right mid lung and left base atelectatic change. No frank edema or consolidation. Heart size and pulmonary vascularity are normal. No adenopathy. No bone lesions. IMPRESSION: Slight right midlung and left base atelectatic change. No edema or consolidation. Electronically Signed   By: Bretta BangWilliam  Woodruff III M.D.   On: 05/21/2016 20:20   [place] Abdomen Limited Ruq  Result Date: 05/22/2016 CLINICAL DATA:  Pneumonia. Elevated liver function studies. Dehydration for 2 days. EXAM: US ABDOMEN LIMITED - RIGHT UPPER QUADRANT COMPARISON:  None. FINDINGS: Gallbladder: Prominent fold along the mid body of the gallbladder. No stones or sludge identified. Mild gallbladder wall thickening is nonspecific. No edema. Murphy's sign is negative. Common bile duct: Diameter: 4.7 mm, normal Liver: Diffusely increased echotexture throughout the liver suggesting fatty infiltration. IMPRESSION: Mild gallbladder wall thickening is nonspecific. No cholelithiasis or other evidence of cholecystitis. Diffuse fatty infiltration of the liver. Electronically Signed   By: Burman NievesWilliam  Stevens M.D.   On: 05/22/2016 01:41         Scheduled Meds: . azithromycin  500 mg Intravenous QHS  . cefTRIAXone (ROCEPHIN)  IV  1 g Intravenous QHS  . enoxaparin (LOVENOX) injection  40 mg Subcutaneous QHS  .  folic acid  1 mg Oral Daily  . mouth rinse  15 mL Mouth Rinse BID  . multivitamin with minerals  1 tablet Oral Daily  . oseltamivir  75 mg Oral BID  . protein supplement  8 oz Oral QID  . sodium chloride  1,000 mL Intravenous Once   And  . sodium chloride  1,000 mL Intravenous Once  . sodium chloride  1,000 mL Intravenous Once  . sodium chloride  1,000 mL Intravenous Once  . thiamine  100 mg Oral Daily   Or  . thiamine  100 mg Intravenous Daily   Continuous Infusions:    LOS: 2 days    Time spent: 35 minutes    Tion Tse A, MD [facility] Pager [phone]  If 7PM-7AM, please contact night-coverage [email or web address] Password [facility] 05/23/2016, 11:52 AM

## 2016-05-23 NOTE — Progress Notes (Signed)
Patient received from RN Ruby. Agree with assessment from this morning's Shift Assessment. Patient's daughter at bedside and supportive. Oriented to room, bed in lowest position and call bell in reach. Will continue to monitor.

## 2016-05-24 LAB — CBC
HEMATOCRIT: 35.3 % — AB (ref 36.0–46.0)
Hemoglobin: 11.9 g/dL — ABNORMAL LOW (ref 12.0–15.0)
MCH: 33.6 pg (ref 26.0–34.0)
MCHC: 33.7 g/dL (ref 30.0–36.0)
MCV: 99.7 fL (ref 78.0–100.0)
PLATELETS: 237 10*3/uL (ref 150–400)
RBC: 3.54 MIL/uL — ABNORMAL LOW (ref 3.87–5.11)
RDW: 18.2 % — AB (ref 11.5–15.5)
WBC: 8.5 10*3/uL (ref 4.0–10.5)

## 2016-05-24 LAB — BASIC METABOLIC PANEL
Anion gap: 5 (ref 5–15)
BUN: 10 mg/dL (ref 6–20)
CALCIUM: 8 mg/dL — AB (ref 8.9–10.3)
CO2: 24 mmol/L (ref 22–32)
CREATININE: 0.42 mg/dL — AB (ref 0.44–1.00)
Chloride: 106 mmol/L (ref 101–111)
GFR calc Af Amer: >60 mL/min (ref >60)
Glucose, Bld: 90 mg/dL (ref 65–99)
POTASSIUM: 5.1 mmol/L (ref 3.5–5.1)
SODIUM: 135 mmol/L (ref 135–145)

## 2016-05-24 MED ORDER — FUROSEMIDE 10 MG/ML IJ SOLN
40.0000 mg | Freq: Once | INTRAMUSCULAR | Status: AC
Start: 2016-05-24 — End: 2016-05-24
  Administered 2016-05-24: 40 mg via INTRAVENOUS
  Filled 2016-05-24: qty 4

## 2016-05-24 MED ORDER — METOPROLOL TARTRATE 25 MG PO TABS
12.5000 mg | ORAL_TABLET | Freq: Two times a day (BID) | ORAL | Status: DC
  Administered 2016-05-24 (×2): 12.5 mg via ORAL
  Filled 2016-05-24 (×2): qty 1

## 2016-05-24 MED ORDER — GUAIFENESIN ER 600 MG PO TB12
1200.0000 mg | ORAL_TABLET | Freq: Two times a day (BID) | ORAL | Status: DC
  Administered 2016-05-24 – 2016-05-26 (×5): 1200 mg via ORAL
  Filled 2016-05-24 (×9): qty 2

## 2016-05-24 NOTE — Progress Notes (Signed)
PROGRESS NOTE  Rachel Williams  ZOX:[id number] DOB: 09-13-1950 DOA: 05/21/2016 PCP: Neldon Labella, MD Outpatient Specialists:  Subjective: Seen with daughter and caregiver at bedside, patient feels much better today. Complaining about swallowing difficulty, SLP to evaluate, PT and OT to evaluate. Some cough, given Lasix, Mucinex and incentive spirometry  Brief Narrative:  Rachel Williams is a 66 y.o. female with no significant past medical history except a perforated viscus a few years ago, repaired and alcoholism who presents with influenza like illness. The patient was in her usual state of health until the last few days, when her daughter noticed that she was not eating, drinking, and seemed weaker than usual. Today, her daughter was concerned that she was dehydrated, checked her blood pressure which was low, and brought her to the ER.  She has had cough, NBNB vomiting, and non-bloody diarrhea, but no sputum, dyspnea, hemoptysis, urinary symptoms.  Assessment & Plan:   Principal Problem:   Sepsis, unspecified organism [facility]) Active Problems:   Hyponatremia   Hypokalemia   Alcohol use (HCC), daily 2-3 drinks of vodka   Malnutrition of moderate degree   Elevated LFTs   Macrocytic anemia   Influenza A   Sepsis, suspected pneumonia, influenza A:  -Presented with a temperature of 102.3 and heart rate of 1042 and suspected infection. -Has influenza type A, likely she has UTI as well. Procalcitonin is 2.34. -Lactic acid was 7 on admission, improved after aggressive hydration with IV fluids currently 1.4. -Given 8 L of IV fluids till now, blood pressure improved. -Started on Rocephin, Zithromax and Tamiflu. Continue current antibiotics. -Sepsis physiology appears to be resolved.  UTI -UA showed TNTC WBC, patient is on Rocephin, will continue. -Urine and blood cultures pending.  Hyponatremia:  -Sodium of 132 on admission, records reviewed this appeared to be chronic. -This is  resolved with IV fluids, currently sodium is 135.  Hypokalemia:  -Likely hyperkaliuria secondary to alcoholism, potassium was 2.8 on admission. -Repleted orally and parenterally, potassium is 5.5 today, follow BMP in a.m.  Hypomagnesemia -Magnesium is 0.9, repleted with parenteral supplements, magnesium is 2.0 this morning.  Elevated LFTs:  -Daughter suggests patient's father had cryptogenic cirrhosis and patient had recent imaging with some suggestion of cirrhosis.  Albumin slightly low, INR normal, platelets normal.  Doubt cholangitis on exam. -Follow LFTs trend and check renal ultrasound. Likely secondary to alcohol, AST/ALT ratio is suggesting alcoholism.  Alcoholism:  Encouraged cessation, patient pre-contemplative. -Started on CIWA protocol, but no Ativan for now as blood pressure is in the low side. -Per daughter and caregiver at bedside she consumes about 6-7 shots of vodka per day.  Anemia:  This is normocytic to macrocytic.  No story to suggest bleeding.  Maybe combination of macrocytic anemia from B12/folate def and bone marrow suppression from alcohol.  Certainly contributing to shock physiology. -Status post transfusion of 2 units of packed RBCs, hemoglobin is 11.1.   DVT prophylaxis: SQ Heparin Code Status: Full Code Family Communication:  Disposition Plan:  Diet: Diet regular Room service appropriate? Yes; Fluid consistency: Thin  Consultants:   None  Procedures:   None  Antimicrobials:  -Ceftriaxone, azithromycin and Tamiflu  Objective: Vitals:   05/23/16 1300 05/23/16 1525 05/23/16 2126 05/24/16 0704  BP:  122/90 107/81 122/89  Pulse: (!) 102 (!) 114 (!) 108 (!) 115  Resp: 16 16 16 18   Temp:  100.1 F (37.8 C) 99.9 F (37.7 C) 99.6 F (37.6 C)  TempSrc:  Oral Oral Oral  SpO2:  97% 93% 96% 96%  Weight:      Height:        Intake/Output Summary (Last 24 hours) at 05/24/16 1232 Last data filed at 05/24/16 0200  Gross per 24 hour  Intake               590 ml  Output                0 ml  Net              590 ml   Filed Weights   05/21/16 1921 05/21/16 2330  Weight: 61.2 kg (135 lb) 62.6 kg (138 lb 0.1 oz)    Examination: General exam: Appears calm and comfortable  Respiratory system: Clear to auscultation. Respiratory effort normal. Cardiovascular system: S1 & S2 heard, RRR. No JVD, murmurs, rubs, gallops or clicks. No pedal edema. Gastrointestinal system: Abdomen is nondistended, soft and nontender. No organomegaly or masses felt. Normal bowel sounds heard. Central nervous system: Alert and oriented. No focal neurological deficits. Extremities: Symmetric 5 x 5 power. Skin: No rashes, lesions or ulcers Psychiatry: Judgement and insight appear normal. Mood & affect appropriate.   Data Reviewed: I have personally reviewed following labs and imaging studies  CBC:  Recent Labs Lab 05/21/16 2055 05/22/16 0055 05/22/16 0425 05/22/16 1141 05/23/16 0346 05/24/16 0555  WBC 14.1* 9.6  --   --  8.9 8.5  NEUTROABS 12.4*  --   --   --   --   --   HGB 7.1* 10.4*  --  11.1* 11.8* 11.9*  HCT 19.8* 29.4* 25.8* 31.3* 32.8* 35.3*  MCV 99.5 102.1*  --   --  96.2 99.7  PLT 380 273  --   --  208 237   Basic Metabolic Panel:  Recent Labs Lab 05/21/16 2055 05/22/16 0050 05/22/16 0055 05/22/16 1609 05/23/16 0346 05/24/16 0555  NA 132*  --  131*  --  134* 135  K 2.9*  --  2.8* 4.4 5.5* 5.1  CL 92*  --  96*  --  109 106  CO2 26  --  23  --  22 24  GLUCOSE 118*  --  103*  --  76 90  BUN 8  --  8  --  10 10  CREATININE 0.70  --  0.72  --  0.48 0.42*  CALCIUM 7.8*  --  7.1*  --  7.1* 8.0*  MG  --  0.9*  --   --  2.0  --    GFR: Estimated Creatinine Clearance: 58.9 mL/min (by C-G formula based on SCr of 0.42 mg/dL (L)). Liver Function Tests:  Recent Labs Lab 05/21/16 2055 05/22/16 0055  AST 205* 197*  ALT 27 25  ALKPHOS 191* 165*  BILITOT 1.3* 1.4*  PROT 5.7* 5.0*  ALBUMIN 2.2* 1.8*   No results for input(s):  LIPASE, AMYLASE in the last 168 hours. No results for input(s): AMMONIA in the last 168 hours. Coagulation Profile:  Recent Labs Lab 05/21/16 2055  INR 1.09   Cardiac Enzymes: No results for input(s): CKTOTAL, CKMB, CKMBINDEX, TROPONINI in the last 168 hours. BNP (last 3 results) No results for input(s): PROBNP in the last 8760 hours. HbA1C: No results for input(s): HGBA1C in the last 72 hours. CBG: No results for input(s): GLUCAP in the last 168 hours. Lipid Profile: No results for input(s): CHOL, HDL, LDLCALC, TRIG, CHOLHDL, LDLDIRECT in the last 72 hours. Thyroid Function Tests: No results for input(s): TSH, T4TOTAL,  FREET4, T3FREE, THYROIDAB in the last 72 hours. Anemia Panel:  Recent Labs  05/22/16 0055 05/22/16 0425  VITAMINB12  --  282  FERRITIN  --  1,175*  TIBC  --  NOT CALCULATED  IRON  --  49  RETICCTPCT 2.0  --    Urine analysis:    Component Value Date/Time   COLORURINE AMBER (A) 05/22/2016 1025   APPEARANCEUR CLOUDY (A) 05/22/2016 1025   LABSPEC 1.020 05/22/2016 1025   PHURINE 5.0 05/22/2016 1025   GLUCOSEU NEGATIVE 05/22/2016 1025   HGBUR MODERATE (A) 05/22/2016 1025   BILIRUBINUR SMALL (A) 05/22/2016 1025   KETONESUR NEGATIVE 05/22/2016 1025   PROTEINUR NEGATIVE 05/22/2016 1025   UROBILINOGEN 1.0 01/11/2010 1611   NITRITE NEGATIVE 05/22/2016 1025   LEUKOCYTESUR MODERATE (A) 05/22/2016 1025   Sepsis Labs: @LABRCNTIP (procalcitonin:4,lacticidven:4)  ) Recent Results (from the past 240 hour(s))  Culture, blood (Routine x 2)     Status: None (Preliminary result)   Collection Time: 05/21/16  8:54 PM  Result Value Ref Range Status   Specimen Description BLOOD LEFT ANTECUBITAL  Final   Special Requests BOTTLES DRAWN AEROBIC AND ANAEROBIC 5CC  Final   Culture   Final    NO GROWTH 3 DAYS Performed at [facility], [address] [place], [place] [address]    Report Status PENDING  Incomplete  Culture, blood (Routine x 2)     Status: None  (Preliminary result)   Collection Time: 05/21/16  9:20 PM  Result Value Ref Range Status   Specimen Description BLOOD LEFT FOREARM  Final   Special Requests BOTTLES DRAWN AEROBIC AND ANAEROBIC 5CC  Final   Culture   Final    NO GROWTH 3 DAYS Performed at [facility], [address] [place], [place] [address]    Report Status PENDING  Incomplete  MRSA PCR Screening     Status: None   Collection Time: 05/22/16 12:30 AM  Result Value Ref Range Status   MRSA by PCR NEGATIVE NEGATIVE Final    Comment:        The GeneXpert MRSA Assay (FDA approved for NASAL specimens only), is one component of a comprehensive MRSA colonization surveillance program. It is not intended to diagnose MRSA infection nor to guide or monitor treatment for MRSA infections.      Invalid input(s): PROCALCITONIN, LACTICACIDVEN   Radiology Studies: No results found.      Scheduled Meds: . azithromycin  500 mg Intravenous QHS  . cefTRIAXone (ROCEPHIN)  IV  1 g Intravenous QHS  . enoxaparin (LOVENOX) injection  40 mg Subcutaneous QHS  . folic acid  1 mg Oral Daily  . guaiFENesin  1,200 mg Oral BID  . mouth rinse  15 mL Mouth Rinse BID  . metoprolol tartrate  12.5 mg Oral BID  . multivitamin with minerals  1 tablet Oral Daily  . oseltamivir  75 mg Oral BID  . protein supplement  8 oz Oral QID  . thiamine  100 mg Oral Daily   Or  . thiamine  100 mg Intravenous Daily   Continuous Infusions:    LOS: 3 days    Time spent: 35 minutes    Avice Funchess A, MD [facility] Pager [phone]  If 7PM-7AM, please contact night-coverage [email or web address] Password [email or web address] 05/24/2016, 12:32 PM

## 2016-05-25 LAB — BASIC METABOLIC PANEL
ANION GAP: 6 (ref 5–15)
BUN: 8 mg/dL (ref 6–20)
CALCIUM: 8 mg/dL — AB (ref 8.9–10.3)
CO2: 27 mmol/L (ref 22–32)
Chloride: 103 mmol/L (ref 101–111)
Creatinine, Ser: 0.4 mg/dL — ABNORMAL LOW (ref 0.44–1.00)
Glucose, Bld: 81 mg/dL (ref 65–99)
Potassium: 4.3 mmol/L (ref 3.5–5.1)
SODIUM: 136 mmol/L (ref 135–145)

## 2016-05-25 LAB — CBC
HCT: 33.8 % — ABNORMAL LOW (ref 36.0–46.0)
HEMOGLOBIN: 11.8 g/dL — AB (ref 12.0–15.0)
MCH: 34.7 pg — ABNORMAL HIGH (ref 26.0–34.0)
MCHC: 34.9 g/dL (ref 30.0–36.0)
MCV: 99.4 fL (ref 78.0–100.0)
Platelets: 203 10*3/uL (ref 150–400)
RBC: 3.4 MIL/uL — AB (ref 3.87–5.11)
RDW: 17.4 % — ABNORMAL HIGH (ref 11.5–15.5)
WBC: 7.3 10*3/uL (ref 4.0–10.5)

## 2016-05-25 LAB — HEPATIC FUNCTION PANEL
ALBUMIN: 1.7 g/dL — AB (ref 3.5–5.0)
ALK PHOS: 265 U/L — AB (ref 38–126)
ALT: 34 U/L (ref 14–54)
AST: 125 U/L — ABNORMAL HIGH (ref 15–41)
Bilirubin, Direct: 0.6 mg/dL — ABNORMAL HIGH (ref 0.1–0.5)
Indirect Bilirubin: 0.7 mg/dL (ref 0.3–0.9)
TOTAL PROTEIN: 4.9 g/dL — AB (ref 6.5–8.1)
Total Bilirubin: 1.3 mg/dL — ABNORMAL HIGH (ref 0.3–1.2)

## 2016-05-25 LAB — PROCALCITONIN: Procalcitonin: 0.58 ng/mL

## 2016-05-25 MED ORDER — FUROSEMIDE 10 MG/ML IJ SOLN
40.0000 mg | Freq: Once | INTRAMUSCULAR | Status: AC
  Administered 2016-05-25: 40 mg via INTRAVENOUS
  Filled 2016-05-25: qty 4

## 2016-05-25 MED ORDER — NICOTINE 14 MG/24HR TD PT24
14.0000 mg | MEDICATED_PATCH | Freq: Every day | TRANSDERMAL | Status: DC
  Administered 2016-05-25 – 2016-05-26 (×2): 14 mg via TRANSDERMAL
  Filled 2016-05-25 (×3): qty 1

## 2016-05-25 MED ORDER — METOPROLOL TARTRATE 25 MG PO TABS
25.0000 mg | ORAL_TABLET | Freq: Two times a day (BID) | ORAL | Status: DC
  Administered 2016-05-26 (×2): 25 mg via ORAL
  Filled 2016-05-25 (×5): qty 1

## 2016-05-25 NOTE — Progress Notes (Signed)
PROGRESS NOTE  Rachel Williams  ZOX:[id number] DOB: Nov 23, 1950 DOA: 05/21/2016 PCP: Neldon Labella, MD Outpatient Specialists:  Subjective: Gain seen with daughter and nursing staff at bedside today, cough improved. Still feeling awfully weak, PT to evaluate, SLP saw her this morning recommended to continue thin liquids. Needs to ambulate with PT. Discussed with daughter not interested in SNF.  Brief Narrative:  Rachel Williams is a 66 y.o. female with no significant past medical history except a perforated viscus a few years ago, repaired and alcoholism who presents with influenza like illness. The patient was in her usual state of health until the last few days, when her daughter noticed that she was not eating, drinking, and seemed weaker than usual. Today, her daughter was concerned that she was dehydrated, checked her blood pressure which was low, and brought her to the ER.  She has had cough, NBNB vomiting, and non-bloody diarrhea, but no sputum, dyspnea, hemoptysis, urinary symptoms.  Assessment & Plan:   Principal Problem:   Sepsis, unspecified organism [facility]) Active Problems:   Hyponatremia   Hypokalemia   Alcohol use (HCC), daily 2-3 drinks of vodka   Malnutrition of moderate degree   Elevated LFTs   Macrocytic anemia   Influenza A   Sepsis, suspected pneumonia, influenza A:  -Presented with a temperature of 102.3 and heart rate of 1042 and suspected infection. -Has influenza type A, likely she has UTI as well. Procalcitonin is 2.34. -Lactic acid was 7 on admission, improved after aggressive hydration with IV fluids currently 1.4. -Given 8 L of IV fluids till now, blood pressure improved. -Started on Rocephin, Zithromax and Tamiflu. Continue current antibiotics for total of 7-10 days. -Sepsis physiology appears to be resolved. -Repeat Lasix a day (she is 13 L positive per records)  UTI -UA showed TNTC WBC, patient is on Rocephin, will continue. -Not sure if urine culture  was sent as it was not back till now.  Hyponatremia:  -Sodium of 132 on admission, records reviewed this appeared to be chronic. -This is resolved with IV fluids, currently sodium is 136.  Hypokalemia:  -Likely hyperkaliuria secondary to alcoholism, potassium was 2.8 on admission. -Repleted orally and parenterally, potassium is 5.5 today, follow BMP in a.m.  Hypomagnesemia -Magnesium is 0.9, repleted with parenteral supplements, magnesium is 2.0 this morning.  Elevated LFTs:  -Daughter suggests patient's father had cryptogenic cirrhosis and patient had recent imaging with some suggestion of cirrhosis.  Albumin slightly low, INR normal, platelets normal.  Doubt cholangitis on exam. -Follow LFTs trend and check renal ultrasound. Likely secondary to alcohol, AST/ALT ratio is suggesting alcoholism.  Alcoholism:  Encouraged cessation, patient pre-contemplative. -Started on CIWA protocol, but no Ativan for now as blood pressure is in the low side. -Per daughter and caregiver at bedside she consumes about 6-7 shots of vodka per day.  Anemia:  This is normocytic to macrocytic.  No story to suggest bleeding.  Maybe combination of macrocytic anemia from B12/folate def and bone marrow suppression from alcohol.  Certainly contributing to shock physiology. -Status post transfusion of 2 units of packed RBCs, hemoglobin is 11.1.   DVT prophylaxis: SQ Heparin Code Status: Full Code Family Communication:  Disposition Plan: Tomorrow to home with likely home health PT, await PT evaluation Diet: Diet regular Room service appropriate? Yes; Fluid consistency: Thin  Consultants:   None  Procedures:   None  Antimicrobials:  -Ceftriaxone, azithromycin and Tamiflu  Objective: Vitals:   05/24/16 2210 05/24/16 2222 05/25/16 0653 05/25/16 1051  BP: 116/84  111/73   Pulse: (!) 112  (!) 107 (!) 113  Resp: (!) 22  20   Temp: 99.8 F (37.7 C)  99.8 F (37.7 C)   TempSrc: Oral  Oral   SpO2:  (!) 88% 90% 97% 91%  Weight:      Height:        Intake/Output Summary (Last 24 hours) at 05/25/16 1057 Last data filed at 05/25/16 0946  Gross per 24 hour  Intake              360 ml  Output                3 ml  Net              357 ml   Filed Weights   05/21/16 1921 05/21/16 2330  Weight: 61.2 kg (135 lb) 62.6 kg (138 lb 0.1 oz)    Examination: General exam: Appears calm and comfortable  Respiratory system: Clear to auscultation. Respiratory effort normal. Cardiovascular system: S1 & S2 heard, RRR. No JVD, murmurs, rubs, gallops or clicks. No pedal edema. Gastrointestinal system: Abdomen is nondistended, soft and nontender. No organomegaly or masses felt. Normal bowel sounds heard. Central nervous system: Alert and oriented. No focal neurological deficits. Extremities: Symmetric 5 x 5 power. Skin: No rashes, lesions or ulcers Psychiatry: Judgement and insight appear normal. Mood & affect appropriate.   Data Reviewed: I have personally reviewed following labs and imaging studies  CBC:  Recent Labs Lab 05/21/16 2055 05/22/16 0055 05/22/16 0425 05/22/16 1141 05/23/16 0346 05/24/16 0555 05/25/16 0522  WBC 14.1* 9.6  --   --  8.9 8.5 7.3  NEUTROABS 12.4*  --   --   --   --   --   --   HGB 7.1* 10.4*  --  11.1* 11.8* 11.9* 11.8*  HCT 19.8* 29.4* 25.8* 31.3* 32.8* 35.3* 33.8*  MCV 99.5 102.1*  --   --  96.2 99.7 99.4  PLT 380 273  --   --  208 237 203   Basic Metabolic Panel:  Recent Labs Lab 05/21/16 2055 05/22/16 0050 05/22/16 0055 05/22/16 1609 05/23/16 0346 05/24/16 0555 05/25/16 0522  NA 132*  --  131*  --  134* 135 136  K 2.9*  --  2.8* 4.4 5.5* 5.1 4.3  CL 92*  --  96*  --  109 106 103  CO2 26  --  23  --  22 24 27   GLUCOSE 118*  --  103*  --  76 90 81  BUN 8  --  8  --  10 10 8   CREATININE 0.70  --  0.72  --  0.48 0.42* 0.40*  CALCIUM 7.8*  --  7.1*  --  7.1* 8.0* 8.0*  MG  --  0.9*  --   --  2.0  --   --    GFR: Estimated Creatinine Clearance:  58.9 mL/min (by C-G formula based on SCr of 0.4 mg/dL (L)). Liver Function Tests:  Recent Labs Lab 05/21/16 2055 05/22/16 0055 05/25/16 0522  AST 205* 197* 125*  ALT 27 25 34  ALKPHOS 191* 165* 265*  BILITOT 1.3* 1.4* 1.3*  PROT 5.7* 5.0* 4.9*  ALBUMIN 2.2* 1.8* 1.7*   No results for input(s): LIPASE, AMYLASE in the last 168 hours. No results for input(s): AMMONIA in the last 168 hours. Coagulation Profile:  Recent Labs Lab 05/21/16 2055  INR 1.09   Cardiac Enzymes: No results  for input(s): CKTOTAL, CKMB, CKMBINDEX, TROPONINI in the last 168 hours. BNP (last 3 results) No results for input(s): PROBNP in the last 8760 hours. HbA1C: No results for input(s): HGBA1C in the last 72 hours. CBG: No results for input(s): GLUCAP in the last 168 hours. Lipid Profile: No results for input(s): CHOL, HDL, LDLCALC, TRIG, CHOLHDL, LDLDIRECT in the last 72 hours. Thyroid Function Tests: No results for input(s): TSH, T4TOTAL, FREET4, T3FREE, THYROIDAB in the last 72 hours. Anemia Panel: No results for input(s): VITAMINB12, FOLATE, FERRITIN, TIBC, IRON, RETICCTPCT in the last 72 hours. Urine analysis:    Component Value Date/Time   COLORURINE AMBER (A) 05/22/2016 1025   APPEARANCEUR CLOUDY (A) 05/22/2016 1025   LABSPEC 1.020 05/22/2016 1025   PHURINE 5.0 05/22/2016 1025   GLUCOSEU NEGATIVE 05/22/2016 1025   HGBUR MODERATE (A) 05/22/2016 1025   BILIRUBINUR SMALL (A) 05/22/2016 1025   KETONESUR NEGATIVE 05/22/2016 1025   PROTEINUR NEGATIVE 05/22/2016 1025   UROBILINOGEN 1.0 01/11/2010 1611   NITRITE NEGATIVE 05/22/2016 1025   LEUKOCYTESUR MODERATE (A) 05/22/2016 1025   Sepsis Labs: @LABRCNTIP (procalcitonin:4,lacticidven:4)  ) Recent Results (from the past 240 hour(s))  Culture, blood (Routine x 2)     Status: None (Preliminary result)   Collection Time: 05/21/16  8:54 PM  Result Value Ref Range Status   Specimen Description BLOOD LEFT ANTECUBITAL  Final   Special Requests  BOTTLES DRAWN AEROBIC AND ANAEROBIC 5CC  Final   Culture   Final    NO GROWTH 3 DAYS Performed at [facility], [address] [place], [place] [address]    Report Status PENDING  Incomplete  Culture, blood (Routine x 2)     Status: None (Preliminary result)   Collection Time: 05/21/16  9:20 PM  Result Value Ref Range Status   Specimen Description BLOOD LEFT FOREARM  Final   Special Requests BOTTLES DRAWN AEROBIC AND ANAEROBIC 5CC  Final   Culture   Final    NO GROWTH 3 DAYS Performed at [facility], [address] [place], [place] [address]    Report Status PENDING  Incomplete  MRSA PCR Screening     Status: None   Collection Time: 05/22/16 12:30 AM  Result Value Ref Range Status   MRSA by PCR NEGATIVE NEGATIVE Final    Comment:        The GeneXpert MRSA Assay (FDA approved for NASAL specimens only), is one component of a comprehensive MRSA colonization surveillance program. It is not intended to diagnose MRSA infection nor to guide or monitor treatment for MRSA infections.      Invalid input(s): PROCALCITONIN, LACTICACIDVEN   Radiology Studies: No results found.      Scheduled Meds: . azithromycin  500 mg Intravenous QHS  . cefTRIAXone (ROCEPHIN)  IV  1 g Intravenous QHS  . enoxaparin (LOVENOX) injection  40 mg Subcutaneous QHS  . folic acid  1 mg Oral Daily  . guaiFENesin  1,200 mg Oral BID  . mouth rinse  15 mL Mouth Rinse BID  . metoprolol tartrate  25 mg Oral BID  . multivitamin with minerals  1 tablet Oral Daily  . oseltamivir  75 mg Oral BID  . protein supplement  8 oz Oral QID  . thiamine  100 mg Oral Daily   Continuous Infusions:    LOS: 4 days    Time spent: 35 minutes    Chrishauna Mee A, MD [facility] Pager [phone]  If 7PM-7AM, please contact night-coverage [email or web address] Password  [email or web address] 05/25/2016, 10:57 AM

## 2016-05-25 NOTE — Evaluation (Signed)
Clinical/Bedside Swallow Evaluation Patient Details  Name: Rachel Williams MRN: [id number] Date of Birth: 06-May-1950  Today's Date: 05/25/2016 Time: SLP Start Time (ACUTE ONLY): 0803 SLP Stop Time (ACUTE ONLY): [id number] SLP Time Calculation (min) (ACUTE ONLY): 18 min  Past Medical History:  Past Medical History:  Diagnosis Date  . Pneumonia    Past Surgical History:  Past Surgical History:  Procedure Laterality Date  . COLON RESECTION     perforation  . COLOSTOMY     HPI:  66 y.o.femalewith no significantpast medical history except a perforated viscus a few years ago, repaired and alcoholismwho presents with sepsios secondary to flu and suspected PNA.   Assessment / Plan / Recommendation Clinical Impression  Pt has oral holding with the appearance of piecemeal swallowing given larger boluses of thin liquids, with cough elicited upon completion of swallow. No further coughing and swifter oral phase was noted with SLP intervention for straw removal and Min cues for smaller bolus sizes. No overt difficulty was noted with solids, but pt reports having minimal appetite. Recommend to continue with regular diet and thin liquids to allow pt the most options in her diet to facilitate intake. Would remove straws and use precautions as recommended below. SLP to f/u as she remains in house.    Aspiration Risk  Mild aspiration risk    Diet Recommendation Regular;Thin liquid   Liquid Administration via: Cup;No straw Medication Administration: Whole meds with puree Supervision: Patient able to self feed;Intermittent supervision to cue for compensatory strategies Compensations: Slow rate;Small sips/bites Postural Changes: Seated upright at 90 degrees;Remain upright for at least 30 minutes after po intake    Other  Recommendations Oral Care Recommendations: Oral care BID   Follow up Recommendations 24 hour supervision/assistance      Frequency and Duration min 2x/week  2 weeks        Prognosis        Swallow Study   General HPI: 66 y.o.femalewith no significantpast medical history except a perforated viscus a few years ago, repaired and alcoholismwho presents with sepsios secondary to flu and suspected PNA. Type of Study: Bedside Swallow Evaluation Previous Swallow Assessment: none in chart Diet Prior to this Study: Regular;Thin liquids Temperature Spikes Noted: Yes (99.8) Respiratory Status: Nasal cannula History of Recent Intubation: No Behavior/Cognition: Alert;Cooperative;Pleasant mood;Requires cueing Oral Care Completed by SLP: No Vision: Functional for self-feeding Self-Feeding Abilities: Able to feed self Patient Positioning: Upright in bed Baseline Vocal Quality: Normal Volitional Cough: Congested    Oral/Motor/Sensory Function     Ice Chips Ice chips: Not tested   Thin Liquid Thin Liquid: Impaired Presentation: Cup;Self Fed;Straw Oral Phase Functional Implications: Oral holding;Other (comment) (piecemeal swallowing) Pharyngeal  Phase Impairments: Cough - Immediate    Nectar Thick Nectar Thick Liquid: Not tested   Honey Thick Honey Thick Liquid: Not tested   Puree Puree: Within functional limits Presentation: Self Fed;Spoon   Solid   GO   Solid: Within functional limits Presentation: Self Georjean ModeFed        Paiewonsky, Arlo Buffone 05/25/2016,8:36 AM  Maxcine HamLaura Paiewonsky, M.A. CCC-SLP [phone]

## 2016-05-25 NOTE — Progress Notes (Signed)
Patient's daughter , after watching PT work with patient, feels short term rehab would be the best choice for her right now. The 24 hr providers of care at home will need her to be stronger. SW paged. Melton Alarana A Delayna Sparlin, RN

## 2016-05-25 NOTE — Evaluation (Addendum)
Physical Therapy Evaluation Patient Details Name: Rachel Williams MRN: [id number] DOB: [birth year] Today's Date: 05/25/2016   History of Present Illness  66 y.o. female with no significant past medical history except a perforated viscus a few years ago, repaired and alcoholism who presents with influenza like illness. Dx of probable UTI, PNA, flu, hypokalemia, hyponatremia.  Clinical Impression  Pt admitted with above diagnosis. Pt currently with functional limitations due to the deficits listed below (see PT Problem List). Max encouragement to participate. Family reports pt had low motivation at home prior to admission.  Max assist for bed mobility and transfers, mod A to take several pivotal steps with RW to recliner. SaO2 91% on RA, HR 113 with activity. ST-SNF recommended.  Pt will benefit from skilled PT to increase their independence and safety with mobility to allow discharge to the venue listed below.       Follow Up Recommendations SNF;Supervision/Assistance - 24 hour    Equipment Recommendations  None recommended by PT    Recommendations for Other Services       Precautions / Restrictions Precautions Precautions: Fall Precaution Comments: 3 falls in past 1 year (none while walking, slid OOB, slid out of chair x 2) Restrictions Weight Bearing Restrictions: No      Mobility  Bed Mobility Overal bed mobility: Needs Assistance Bed Mobility: Supine to Sit     Supine to sit: Max assist     General bed mobility comments: assist to advance BLEs and to raise trunk  Transfers Overall transfer level: Needs assistance Equipment used: Rolling walker (2 wheeled) Transfers: Sit to/from Stand Sit to Stand: Max assist         General transfer comment: VCs hand placement, sit to stand x 3 trials; max A to power up, VCs to correct flexed posture in standing, increased time  Ambulation/Gait Ambulation/Gait assistance: Mod assist Ambulation Distance (Feet): 2 Feet Assistive  device: Rolling walker (2 wheeled) Gait Pattern/deviations: Step-to pattern;Decreased step length - left;Decreased step length - right;Shuffle;Trunk flexed   Gait velocity interpretation: Below normal speed for age/gender General Gait Details: pt took several pivotal steps from bed to recliner with assist to manage RW, VCs for posture and positioning in RW, increased time; SaO2 91% on RA with activity, HR 113  Stairs            Wheelchair Mobility    Modified Rankin (Stroke Patients Only)       Balance Overall balance assessment: Needs assistance;History of Falls   Sitting balance-Leahy Scale: Fair     Standing balance support: Bilateral upper extremity supported Standing balance-Leahy Scale: Poor Standing balance comment: posterior lean initially in standing then able to maintain neutral with BUE support                             Pertinent Vitals/Pain Pain Assessment: No/denies pain    Home Living Family/patient expects to be discharged to:: Private residence Living Arrangements: Alone Available Help at Discharge: Personal care attendant;Family;Available 24 hours/day Type of Home: House Home Access: Ramped entrance     Home Layout: One level Home Equipment: Bedside commode;Wheelchair - Careers advisermanual;Other (comment);Walker - 4 wheels;Transport chair      Prior Function Level of Independence: Needs assistance   Gait / Transfers Assistance Needed: amb I'ly with rollator  ADL's / Homemaking Assistance Needed: assist with bathing/dressing;  aide helps with meals and housework and getting OOB  Comments: incontinent uses depends  Hand Dominance        Extremity/Trunk Assessment   Upper Extremity Assessment Upper Extremity Assessment: Generalized weakness    Lower Extremity Assessment Lower Extremity Assessment: Generalized weakness (knee ext 4/5 B)    Cervical / Trunk Assessment Cervical / Trunk Assessment: Kyphotic  Communication    Communication: No difficulties  Cognition Arousal/Alertness: Awake/alert Behavior During Therapy: WFL for tasks assessed/performed Overall Cognitive Status: Within Functional Limits for tasks assessed                 General Comments: groggy, increased time to respond to questions/commands, but oriented    General Comments      Exercises     Assessment/Plan    PT Assessment Patient needs continued PT services  PT Problem List Decreased strength;Decreased activity tolerance;Decreased balance;Decreased mobility          PT Treatment Interventions Gait training;Functional mobility training;Balance training;Therapeutic exercise;Therapeutic activities;DME instruction;Patient/family education    PT Goals (Current goals can be found in the Care Plan section)  Acute Rehab PT Goals Patient Stated Goal: avoid going to SNF PT Goal Formulation: With patient/family Time For Goal Achievement: 06/08/16 Potential to Achieve Goals: Fair    Frequency Min 3X/week   Barriers to discharge        Co-evaluation               End of Session Equipment Utilized During Treatment: Gait belt Activity Tolerance: Patient limited by fatigue Patient left: in chair;with call bell/phone within reach;with family/visitor present;with chair alarm set Nurse Communication: Mobility status         Time: 1610-9604 PT Time Calculation (min) (ACUTE ONLY): 29 min   Charges:   PT Evaluation $PT Eval Moderate Complexity: 1 Procedure PT Treatments $Therapeutic Activity: 8-22 mins   PT G Codes:        Tamala Ser 05/25/2016, 10:58 AM [phone]

## 2016-05-26 LAB — CBC
HEMATOCRIT: 32.8 % — AB (ref 36.0–46.0)
Hemoglobin: 11.4 g/dL — ABNORMAL LOW (ref 12.0–15.0)
MCH: 34.3 pg — ABNORMAL HIGH (ref 26.0–34.0)
MCHC: 34.8 g/dL (ref 30.0–36.0)
MCV: 98.8 fL (ref 78.0–100.0)
Platelets: 213 10*3/uL (ref 150–400)
RBC: 3.32 MIL/uL — ABNORMAL LOW (ref 3.87–5.11)
RDW: 17.1 % — AB (ref 11.5–15.5)
WBC: 6.9 10*3/uL (ref 4.0–10.5)

## 2016-05-26 LAB — CULTURE, BLOOD (ROUTINE X 2)
CULTURE: NO GROWTH
Culture: NO GROWTH

## 2016-05-26 LAB — BASIC METABOLIC PANEL
Anion gap: 5 (ref 5–15)
BUN: 9 mg/dL (ref 6–20)
CHLORIDE: 100 mmol/L — AB (ref 101–111)
CO2: 28 mmol/L (ref 22–32)
Calcium: 7.8 mg/dL — ABNORMAL LOW (ref 8.9–10.3)
Creatinine, Ser: 0.44 mg/dL (ref 0.44–1.00)
GFR calc Af Amer: >60 mL/min (ref >60)
GFR calc non Af Amer: >60 mL/min (ref >60)
Glucose, Bld: 86 mg/dL (ref 65–99)
POTASSIUM: 3.6 mmol/L (ref 3.5–5.1)
Sodium: 133 mmol/L — ABNORMAL LOW (ref 135–145)

## 2016-05-26 MED ORDER — TUBERCULIN PPD 5 UNIT/0.1ML ID SOLN
5.0000 [IU] | Freq: Once | INTRADERMAL | Status: DC
  Administered 2016-05-26: 5 [IU] via INTRADERMAL
  Filled 2016-05-26: qty 0.1

## 2016-05-26 NOTE — Progress Notes (Signed)
PROGRESS NOTE  [name]  ZOX:[id number] DOB: [birth year] DOA: 05/21/2016 PCP: Neldon Labella, MD Outpatient Specialists:  Subjective: She denies any complaints this morning, no fever chills overnight. Patient to her daughter changed her mind, wants to go to ALF, she scheduled to go to [facility] in a.m.  Brief Narrative:  Rachel Williams is a 66 y.o. female with no significant past medical history except a perforated viscus a few years ago, repaired and alcoholism who presents with influenza like illness. The patient was in her usual state of health until the last few days, when her daughter noticed that she was not eating, drinking, and seemed weaker than usual. Today, her daughter was concerned that she was dehydrated, checked her blood pressure which was low, and brought her to the ER.  She has had cough, NBNB vomiting, and non-bloody diarrhea, but no sputum, dyspnea, hemoptysis, urinary symptoms.  Assessment & Plan:   Principal Problem:   Sepsis, unspecified organism [facility]) Active Problems:   Hyponatremia   Hypokalemia   Alcohol use (HCC), daily 2-3 drinks of vodka   Malnutrition of moderate degree   Elevated LFTs   Macrocytic anemia   Influenza A   Sepsis, suspected pneumonia, influenza A:  -Presented with a temperature of 102.3 and heart rate of 1042 and suspected infection. -Has influenza type A, likely she has UTI as well. Procalcitonin is 2.34. -Lactic acid was 7 on admission, improved after aggressive hydration with IV fluids currently 1.4. -Given 8 L of IV fluids till now, blood pressure improved. -Started on Rocephin, Zithromax and Tamiflu. Continue current antibiotics for total of 7-10 days. -Sepsis physiology appears to be resolved.  UTI -UA showed TNTC WBC, patient is on Rocephin, will continue. -Not sure if urine culture was sent as it was not back till now. Likely she will not need antibiotics on discharge.  Hyponatremia:  -Sodium of 132 on admission,  records reviewed this appeared to be chronic. -This is resolved with IV fluids, currently sodium is 136.  Hypokalemia:  -Likely hyperkaliuria secondary to alcoholism, potassium was 2.8 on admission. -Repleted orally and parenterally.  Hypomagnesemia -Magnesium is 0.9, repleted with parenteral supplements, magnesium is 2.0 this morning.  Elevated LFTs:  -Daughter suggests patient's father had cryptogenic cirrhosis and patient had recent imaging with some suggestion of cirrhosis.  Albumin slightly low, INR normal, platelets normal.  Doubt cholangitis on exam. -Follow LFTs trend and check renal ultrasound. Likely secondary to alcohol, AST/ALT ratio is suggesting alcoholism.  Alcoholism:  Encouraged cessation, patient pre-contemplative. -Started on CIWA protocol, but no Ativan for now as blood pressure is in the low side. -Per daughter and caregiver at bedside she consumes about 6-7 shots of vodka per day.  Anemia:  This is normocytic to macrocytic.  No story to suggest bleeding.  Maybe combination of macrocytic anemia from B12/folate def and bone marrow suppression from alcohol.  Certainly contributing to shock physiology. -Status post transfusion of 2 units of packed RBCs, hemoglobin is 11.1.   DVT prophylaxis: SQ Heparin Code Status: Full Code Family Communication:  Disposition Plan: A.m. to ALF. Diet: Diet regular Room service appropriate? Yes; Fluid consistency: Thin  Consultants:   None  Procedures:   None  Antimicrobials:  -Ceftriaxone, azithromycin and Tamiflu  Objective: Vitals:   05/25/16 1155 05/25/16 1300 05/25/16 2209 05/26/16 0611  BP: (!) 74/56 95/74 96/75  107/80  Pulse:  (!) 57 (!) 107 (!) 106  Resp:  16 16 16   Temp:  99.8 F (37.7 C) 99  F (37.2 C) 99.1 F (37.3 C)  TempSrc:  Oral Oral Oral  SpO2: 94% 90% 92% 93%  Weight:      Height:        Intake/Output Summary (Last 24 hours) at 05/26/16 1144 Last data filed at 05/26/16 16100611  Gross per 24  hour  Intake              480 ml  Output                2 ml  Net              478 ml   Filed Weights   05/21/16 1921 05/21/16 2330  Weight: 61.2 kg (135 lb) 62.6 kg (138 lb 0.1 oz)    Examination: General exam: Appears calm and comfortable  Respiratory system: Clear to auscultation. Respiratory effort normal. Cardiovascular system: S1 & S2 heard, RRR. No JVD, murmurs, rubs, gallops or clicks. No pedal edema. Gastrointestinal system: Abdomen is nondistended, soft and nontender. No organomegaly or masses felt. Normal bowel sounds heard. Central nervous system: Alert and oriented. No focal neurological deficits. Extremities: Symmetric 5 x 5 power. Skin: No rashes, lesions or ulcers Psychiatry: Judgement and insight appear normal. Mood & affect appropriate.   Data Reviewed: I have personally reviewed following labs and imaging studies  CBC:  Recent Labs Lab 05/21/16 2055 05/22/16 0055  05/22/16 1141 05/23/16 0346 05/24/16 0555 05/25/16 0522 05/26/16 0513  WBC 14.1* 9.6  --   --  8.9 8.5 7.3 6.9  NEUTROABS 12.4*  --   --   --   --   --   --   --   HGB 7.1* 10.4*  --  11.1* 11.8* 11.9* 11.8* 11.4*  HCT 19.8* 29.4*  < > 31.3* 32.8* 35.3* 33.8* 32.8*  MCV 99.5 102.1*  --   --  96.2 99.7 99.4 98.8  PLT 380 273  --   --  208 237 203 213  < > = values in this interval not displayed. Basic Metabolic Panel:  Recent Labs Lab 05/22/16 0050 05/22/16 0055 05/22/16 1609 05/23/16 0346 05/24/16 0555 05/25/16 0522 05/26/16 0513  NA  --  131*  --  134* 135 136 133*  K  --  2.8* 4.4 5.5* 5.1 4.3 3.6  CL  --  96*  --  109 106 103 100*  CO2  --  23  --  22 24 27 28   GLUCOSE  --  103*  --  76 90 81 86  BUN  --  8  --  10 10 8 9   CREATININE  --  0.72  --  0.48 0.42* 0.40* 0.44  CALCIUM  --  7.1*  --  7.1* 8.0* 8.0* 7.8*  MG 0.9*  --   --  2.0  --   --   --    GFR: Estimated Creatinine Clearance: 58.9 mL/min (by C-G formula based on SCr of 0.44 mg/dL). Liver Function  Tests:  Recent Labs Lab 05/21/16 2055 05/22/16 0055 05/25/16 0522  AST 205* 197* 125*  ALT 27 25 34  ALKPHOS 191* 165* 265*  BILITOT 1.3* 1.4* 1.3*  PROT 5.7* 5.0* 4.9*  ALBUMIN 2.2* 1.8* 1.7*   No results for input(s): LIPASE, AMYLASE in the last 168 hours. No results for input(s): AMMONIA in the last 168 hours. Coagulation Profile:  Recent Labs Lab 05/21/16 2055  INR 1.09   Cardiac Enzymes: No results for input(s): CKTOTAL, CKMB, CKMBINDEX, TROPONINI in the last  168 hours. BNP (last 3 results) No results for input(s): PROBNP in the last 8760 hours. HbA1C: No results for input(s): HGBA1C in the last 72 hours. CBG: No results for input(s): GLUCAP in the last 168 hours. Lipid Profile: No results for input(s): CHOL, HDL, LDLCALC, TRIG, CHOLHDL, LDLDIRECT in the last 72 hours. Thyroid Function Tests: No results for input(s): TSH, T4TOTAL, FREET4, T3FREE, THYROIDAB in the last 72 hours. Anemia Panel: No results for input(s): VITAMINB12, FOLATE, FERRITIN, TIBC, IRON, RETICCTPCT in the last 72 hours. Urine analysis:    Component Value Date/Time   COLORURINE AMBER (A) 05/22/2016 1025   APPEARANCEUR CLOUDY (A) 05/22/2016 1025   LABSPEC 1.020 05/22/2016 1025   PHURINE 5.0 05/22/2016 1025   GLUCOSEU NEGATIVE 05/22/2016 1025   HGBUR MODERATE (A) 05/22/2016 1025   BILIRUBINUR SMALL (A) 05/22/2016 1025   KETONESUR NEGATIVE 05/22/2016 1025   PROTEINUR NEGATIVE 05/22/2016 1025   UROBILINOGEN 1.0 01/11/2010 1611   NITRITE NEGATIVE 05/22/2016 1025   LEUKOCYTESUR MODERATE (A) 05/22/2016 1025   Sepsis Labs: @LABRCNTIP (procalcitonin:4,lacticidven:4)  ) Recent Results (from the past 240 hour(s))  Culture, blood (Routine x 2)     Status: None   Collection Time: 05/21/16  8:54 PM  Result Value Ref Range Status   Specimen Description BLOOD LEFT ANTECUBITAL  Final   Special Requests BOTTLES DRAWN AEROBIC AND ANAEROBIC 5CC  Final   Culture   Final    NO GROWTH 5 DAYS Performed  at [facility], [address] [place], [place] [address]    Report Status 05/26/2016 FINAL  Final  Culture, blood (Routine x 2)     Status: None   Collection Time: 05/21/16  9:20 PM  Result Value Ref Range Status   Specimen Description BLOOD LEFT FOREARM  Final   Special Requests BOTTLES DRAWN AEROBIC AND ANAEROBIC 5CC  Final   Culture   Final    NO GROWTH 5 DAYS Performed at [facility], [address] [place], [place] [address]    Report Status 05/26/2016 FINAL  Final  MRSA PCR Screening     Status: None   Collection Time: 05/22/16 12:30 AM  Result Value Ref Range Status   MRSA by PCR NEGATIVE NEGATIVE Final    Comment:        The GeneXpert MRSA Assay (FDA approved for NASAL specimens only), is one component of a comprehensive MRSA colonization surveillance program. It is not intended to diagnose MRSA infection nor to guide or monitor treatment for MRSA infections.      Invalid input(s): PROCALCITONIN, LACTICACIDVEN   Radiology Studies: No results found.      Scheduled Meds: . azithromycin  500 mg Intravenous QHS  . cefTRIAXone (ROCEPHIN)  IV  1 g Intravenous QHS  . enoxaparin (LOVENOX) injection  40 mg Subcutaneous QHS  . folic acid  1 mg Oral Daily  . guaiFENesin  1,200 mg Oral BID  . mouth rinse  15 mL Mouth Rinse BID  . metoprolol tartrate  25 mg Oral BID  . multivitamin with minerals  1 tablet Oral Daily  . nicotine  14 mg Transdermal Daily  . protein supplement  8 oz Oral QID  . thiamine  100 mg Oral Daily   Continuous Infusions:    LOS: 5 days    Time spent: 35 minutes    Guila Owensby A, MD [facility] Pager [phone]  If 7PM-7AM, please contact night-coverage [email or web address] Password [facility] 05/26/2016, 11:44 AM

## 2016-05-26 NOTE — Clinical Social Work Note (Signed)
Clinical Social Work Assessment  Patient Details  Name: Rachel JewelsSusan M [place] MRN: [id number] Date of Birth: 07/24/1950  Date of referral:  05/26/16               Reason for consult:  Facility Placement                Permission sought to share information with:  Facility Industrial/product designerContact Representative Permission granted to share information::  Yes, Verbal Permission Granted  Name::        Agency::     Relationship::     Contact Information:     Housing/Transportation Living arrangements for the past 2 months:  Single Family Home Source of Information:   Vernona Rieger(Laura) Patient Interpreter Needed:  None Criminal Activity/Legal Involvement Pertinent to Current Situation/Hospitalization:    Significant Relationships:  Adult Children Lives with:  Self Do you feel safe going back to the place where you live?  No Need for family participation in patient care:  Yes (Comment)  Care giving concerns:  PT recommended SNF however patient/family [facility].    Social Worker assessment / plan:  CSW spoke with daughter, Vernona RiegerLaura, via phone regarding discharge plans. PT evaluated patient and recommended SNF; however [facility]. Per daughter, family currently pays $12,000 per month for private care at home and would prefer assisted living at this point in time. [facility]/ [facility] options. [facility] Monday 2/19. Facility is requesting TB skin test to be placed, PT notes, and FL2 with no PASRR. Facility has nurse coming to evaluate patient Sunday 2/18 to confirm acceptance. CSW will complete FL2 and [facility]. CSW will also fax information to SNF's in [place] as back up.   Employment status:  Retired Database administratornsurance information:  Managed Medicare PT Recommendations:  Skilled Nursing Facility Information / Referral to community  resources:  Other (Comment Required) (Assisted Living )  Patient/Family's Response to care:  Family appreciated CSW.   Patient/Family's Understanding of and Emotional Response to Diagnosis, Current Treatment, and Prognosis:  Family and patient understand current treatment and prognosis.   Emotional Assessment Appearance:  Appears older than stated age Attitude/Demeanor/Rapport:    Affect (typically observed):  Quiet, Sad Orientation:  Oriented to Self, Oriented to Place, Oriented to  Time, Oriented to Situation Alcohol / Substance use:    Psych involvement (Current and /or in the community):  No (Comment)  Discharge Needs  Concerns to be addressed:  No discharge needs identified Readmission within the last 30 days:  No Current discharge risk:  None Barriers to Discharge:  No Barriers Identified   Donnie Coffinrin M Harlean Regula, LCSW 05/26/2016, 12:05 PM

## 2016-05-26 NOTE — Progress Notes (Addendum)
CSW received call from Clementeen GrahamMarie Donn [facility] stated patients daughter has requested to go to their facility. CSW spoke with private caregiver at bedside who stated [facility]. [facility] requested TB skin test if patient will be coming to their facility. CSW will follow up with daughter to verify this plan.   10:43am- CSW spoke with daughter, Vernona RiegerLaura, who stated [facility] Daughter states they are currently paying $12,000 a month for private care and would prefer assisted living. [facility] who stated they do not have a bed available today 2/18 but would have a bed tomorrow 2/19. CSW update MD who is agreeable to this plan.   [facility] will need FL2, TB skin test (does not have to be read. Can be placed with orders to read at facility), and nurse will evaluate patient today.   CSW will continue to update.   Stacy GardnerErin Aubree Doody, LCSWA Clinical Social Worker [phone]

## 2016-05-26 NOTE — [place] FL2 (Signed)
[place] MEDICAID FL2 LEVEL OF CARE SCREENING TOOL     IDENTIFICATION  Patient Name: Rachel Williams Birthdate: March 17, 1951 Sex: female Admission Date (Current Location): 05/21/2016  [facility] [place]   Facility and Address:  [facility],  [address], [place] [id number]      Provider Number: [id number]  Attending Physician Name and Address:  Clydia LlanoMutaz Elmahi, MD  Relative Name and Phone Number:       Current Level of Care: Hospital Recommended Level of Care: Assisted Living Facility Prior Approval Number:    Date Approved/Denied:   PASRR Number:    Discharge Plan: Other (Comment) (ALF)    Current Diagnoses: Patient Active Problem List   Diagnosis Date Noted  . Sepsis, unspecified organism (HCC) 05/21/2016  . Elevated LFTs 05/21/2016  . Macrocytic anemia 05/21/2016  . Influenza A 05/21/2016  . Malnutrition of moderate degree 05/29/2015  . NSVT (nonsustained ventricular tachycardia) (HCC) 05/29/2015  . Hyponatremia 05/27/2015  . Recurrent falls 05/27/2015  . Facial bruising 05/27/2015  . Hypomagnesemia 05/27/2015  . Hypokalemia 05/27/2015  . Alcohol use (HCC), daily 2-3 drinks of vodka 05/27/2015  . Smoker 05/27/2015  . Dehydration 05/27/2015  . Vertebral compression fracture (HCC) 05/27/2015  . Acute pharyngitis 05/27/2015  . Chronic constipation 05/27/2015  . Generalized weakness     Orientation RESPIRATION BLADDER Height & Weight     Self, Time, Situation, Place  Normal Incontinent Weight: 138 lb 0.1 oz (62.6 kg) Height:  5\' 4"  (162.6 cm)  BEHAVIORAL SYMPTOMS/MOOD NEUROLOGICAL BOWEL NUTRITION STATUS      Incontinent Diet (regular)  AMBULATORY STATUS COMMUNICATION OF NEEDS Skin   Extensive Assist Verbally Normal                       Personal Care Assistance Level of Assistance  Bathing, Feeding, Dressing   Feeding assistance: Independent Dressing Assistance: Limited assistance     Functional Limitations Info              SPECIAL CARE FACTORS FREQUENCY                       Contractures      Additional Factors Info  Code Status, Allergies Code Status Info: Full code Allergies Info: CIPROFLOXACIN, LEVOFLOXACIN, NICKEL, PENICILLINS            Current Medications (05/26/2016):  This is the current hospital active medication list Current Facility-Administered Medications  Medication Dose Route Frequency Provider Last Rate Last Dose  . acetaminophen (TYLENOL) suppository 650 mg  650 mg Rectal Q4H PRN Roma KayserKatherine P Schorr, NP   650 mg at 05/22/16 0315  . azithromycin (ZITHROMAX) 500 mg in dextrose 5 % 250 mL IVPB  500 mg Intravenous QHS Alberteen Samhristopher P Danford, MD   500 mg at 05/25/16 2232  . cefTRIAXone (ROCEPHIN) 1 g in dextrose 5 % 50 mL IVPB  1 g Intravenous QHS Alberteen Samhristopher P Danford, MD   1 g at 05/25/16 2232  . enoxaparin (LOVENOX) injection 40 mg  40 mg Subcutaneous QHS Alberteen Samhristopher P Danford, MD   40 mg at 05/25/16 2232  . folic acid (FOLVITE) tablet 1 mg  1 mg Oral Daily Alberteen Samhristopher P Danford, MD   1 mg at 05/26/16 1116  . guaiFENesin (MUCINEX) 12 hr tablet 1,200 mg  1,200 mg Oral BID Clydia LlanoMutaz Elmahi, MD   1,200 mg at 05/26/16 1115  . MEDLINE mouth rinse  15 mL Mouth Rinse BID  Alberteen Sam, MD   15 mL at 05/26/16 1000  . metoprolol (LOPRESSOR) injection 5 mg  5 mg Intravenous Q6H PRN Clydia Llano, MD      . metoprolol tartrate (LOPRESSOR) tablet 25 mg  25 mg Oral BID Clydia Llano, MD   25 mg at 05/26/16 1000  . multivitamin with minerals tablet 1 tablet  1 tablet Oral Daily Alberteen Sam, MD   1 tablet at 05/26/16 1116  . nicotine (NICODERM CQ - dosed in mg/24 hours) patch 14 mg  14 mg Transdermal Daily Clydia Llano, MD   14 mg at 05/26/16 1116  . ondansetron (ZOFRAN) tablet 4 mg  4 mg Oral Q6H PRN Alberteen Sam, MD       Or  . ondansetron (ZOFRAN) injection 4 mg  4 mg Intravenous Q6H PRN Alberteen Sam, MD      . protein supplement (UNJURY CHICKEN  SOUP) powder 8 oz  8 oz Oral QID Clydia Llano, MD   8 oz at 05/26/16 1000  . thiamine (VITAMIN B-1) tablet 100 mg  100 mg Oral Daily Alberteen Sam, MD   100 mg at 05/26/16 1116     Discharge Medications: Please see discharge summary for a list of discharge medications.  Relevant Imaging Results:  Relevant Lab Results:   Additional Information SS#: [id number]  Donnie Coffin, LCSW

## 2016-05-27 LAB — BASIC METABOLIC PANEL
Anion gap: 6 (ref 5–15)
BUN: 11 mg/dL (ref 6–20)
CHLORIDE: 100 mmol/L — AB (ref 101–111)
CO2: 29 mmol/L (ref 22–32)
Calcium: 8.2 mg/dL — ABNORMAL LOW (ref 8.9–10.3)
Creatinine, Ser: 0.44 mg/dL (ref 0.44–1.00)
GFR calc non Af Amer: >60 mL/min (ref >60)
Glucose, Bld: 101 mg/dL — ABNORMAL HIGH (ref 65–99)
POTASSIUM: 3.5 mmol/L (ref 3.5–5.1)
SODIUM: 135 mmol/L (ref 135–145)

## 2016-05-27 MED ORDER — MAGNESIUM OXIDE -MG SUPPLEMENT 200 MG PO TABS: 1.0000 | ORAL_TABLET | Freq: Every day | ORAL | 0 refills | Status: DC

## 2016-05-27 MED ORDER — ADULT MULTIVITAMIN W/MINERALS CH: 1.0000 | ORAL_TABLET | Freq: Every day | ORAL | 0 refills | Status: DC

## 2016-05-27 NOTE — Discharge Summary (Signed)
Physician Discharge Summary  Rachel Williams ZOX:[id number] DOB: 1950/09/09 DOA: 05/21/2016  PCP: Rachel Labella, MD  Admit date: 05/21/2016 Discharge date: 05/27/2016  Admitted From: Home Disposition: [facility]  Recommendations for Outpatient Follow-up:  1. Follow up with PCP in 1-2 weeks 2. Please obtain BMP/CBC in one week  Home Health: NA Equipment/Devices:NA  Discharge Condition: Stable CODE STATUS: Full Code Diet recommendation: Diet regular Room service appropriate? Yes; Fluid consistency: Thin Diet - low sodium heart healthy  Brief/Interim Summary: Rachel Williams a 66 y.o.femalewith no significantpast medical history except a perforated viscus a few years ago, repaired and alcoholismwho presents with influenza like illness. The patient was in her usual state of health until the last few days, when her daughter noticed that she was not eating, drinking, and seemed weaker than usual. Today, her daughter was concerned that she was dehydrated, checked her blood pressure which was low, and brought her to the ER. She has had cough, NBNB vomiting, and non-bloodydiarrhea, but no sputum, dyspnea, hemoptysis, urinary symptoms.  Discharge Diagnoses:  Principal Problem:   Sepsis, unspecified organism [facility]) Active Problems:   Hyponatremia   Hypokalemia   Alcohol use (HCC), daily 2-3 drinks of vodka   Malnutrition of moderate degree   Elevated LFTs   Macrocytic anemia   Influenza A   Sepsis, suspected pneumonia, influenza A: -Presented with a temperature of 102.3 and heart rate of 1042 and suspected infection. -Has influenza type A, likely she has UTI as well. Procalcitonin is 2.34. -Lactic acid was 7 on admission, improved after aggressive hydration with IV fluids currently 1.4. -Given total 8 L of IV fluids, blood pressure improved. -Finished 6 days of Rocephin, the Zithromax and 5 days of Tamiflu but no further antibiotics/antiviral recommended. -Sepsis  physiology resolved.  UTI -UA showed TNTC WBC, patient treated with Rocephin, she was on Rocephin for 6 days. -For some reason culture was not sent, but she finished the intended course of antibiotics with Rocephin.  Hyponatremia: -Sodium of 132 on admission, records reviewed this appeared to be chronic. -This is resolved with IV fluids, currently sodium is 136.  Hypokalemia: -Likely hyperkaliuria secondary to alcoholism, potassium was 2.8 on admission. -Repleted orally and parenterally.  Hypomagnesemia -Magnesium is 0.9, repleted with parenteral supplements, magnesium is 2.0 this morning.  Elevated LFTs: -Daughter suggests patient's father had cryptogenic cirrhosis and patient had recent imaging with some suggestion of cirrhosis. Albumin slightly low, INR normal, platelets normal. Doubt cholangitis on exam. -AST/ALT ratio is suggesting alcoholism. -RUQ ultrasound showed fatty infiltration of the liver.  Alcoholism: Encouraged cessation, patient pre-contemplative. -Started on CIWA protocol, but no Ativan for now as blood pressure is in the low side. -Per daughter and caregiver at bedside she consumes about 6-7 shots of vodka per day. -Had mild withdrawal symptoms only tachycardia, but currently asymptomatic.  Anemia: This is normocytic to macrocytic. No story to suggest bleeding. Maybe combination of macrocytic anemia from B12/folate def and bone marrow suppression from alcohol. Certainly contributing to shock physiology. -Status post transfusion of 2 units of packed RBCs, hemoglobin is 11.4.  Discharge Instructions  Discharge Instructions    Diet - low sodium heart healthy    Complete by:  As directed    Increase activity slowly    Complete by:  As directed      Allergies as of 05/27/2016      Reactions   Ciprofloxacin Rash   Levofloxacin Rash   Rash on back after 5 days therapy.  Nickel Rash   Anything with nickel in it.    Penicillins Rash   Has  patient had a PCN reaction causing immediate rash, facial/tongue/throat swelling, SOB or lightheadedness with hypotension: No Has patient had a PCN reaction causing severe rash involving mucus membranes or skin necrosis: No Ha patient had a PCN reaction that required hospitalization No Has patient had a PCN reaction occurring within the last 10 years: No If all of the above answers are "NO", then may proceed with Cephalosporin use. Had simple rash around age 66, needed no further care      Medication List    TAKE these medications   Magnesium Oxide 200 MG Tabs Take 1 tablet (200 mg total) by mouth daily.   multivitamin with minerals Tabs tablet Take 1 tablet by mouth daily.       Allergies  Allergen Reactions  . Ciprofloxacin Rash  . Levofloxacin Rash    Rash on back after 5 days therapy.   . Nickel Rash    Anything with nickel in it.   Marland Kitchen. Penicillins Rash    Has patient had a PCN reaction causing immediate rash, facial/tongue/throat swelling, SOB or lightheadedness with hypotension: No Has patient had a PCN reaction causing severe rash involving mucus membranes or skin necrosis: No Ha patient had a PCN reaction that required hospitalization No Has patient had a PCN reaction occurring within the last 10 years: No If all of the above answers are "NO", then may proceed with Cephalosporin use. Had simple rash around age 66, needed no further care      Consultations:  None   Procedures (Echo, Carotid, EGD, Colonoscopy, ERCP)   Radiological studies: Dg Chest 2 View  Result Date: 05/21/2016 CLINICAL DATA:  Hypotension.  Vomiting. EXAM: CHEST  2 VIEW COMPARISON:  Chest radiograph April 11, 2016 and chest CT May 09, 2016 FINDINGS: There is slight right mid lung and left base atelectatic change. No frank edema or consolidation. Heart size and pulmonary vascularity are normal. No adenopathy. No bone lesions. IMPRESSION: Slight right midlung and left base atelectatic change.  No edema or consolidation. Electronically Signed   By: Bretta BangWilliam  Woodruff III M.D.   On: 05/21/2016 20:20   Ct Chest W Contrast  Addendum Date: 05/09/2016   ADDENDUM REPORT: 05/09/2016 20:07 ADDENDUM: These results were called by telephone at the time of interpretation on 05/09/2016 at 1:40 p.m. to Dr. Caryn BeeKevin Via, who verbally acknowledged these results. Electronically Signed   By: Ted Mcalpineobrinka  Dimitrova M.D.   On: 05/09/2016 20:07   Result Date: 05/09/2016 CLINICAL DATA:  Abnormal chest x-ray. EXAM: CT CHEST WITH CONTRAST TECHNIQUE: Multidetector CT imaging of the chest was performed during intravenous contrast administration. CONTRAST:  75mL ISOVUE-300 IOPAMIDOL (ISOVUE-300) INJECTION 61% COMPARISON:  Chest radiograph 04/11/2016 FINDINGS: Cardiovascular: Normal heart size. No evidence of pericardial effusion. Calcific atherosclerotic disease of the coronary arteries. Aortic valve annular calcifications. Mediastinum/Nodes: No enlarged mediastinal, hilar, or axillary lymph nodes. Thyroid gland, trachea, and esophagus demonstrate no significant findings. Lungs/Pleura: Lungs are clear, apart from linear subpleural opacity in the lingula. No pleural effusion or pneumothorax. Upper Abdomen: Abnormal appearance of the liver with geographic areas of hypoattenuation involving approximately 80% of the visualized liver parenchyma. Hypoattenuated appearance of the celiac trunk. Musculoskeletal: T6 superior endplate compression deformity with chronic appearance. L1-L2 osteoarthritic changes. IMPRESSION: Linear subpleural opacity in the lingula may represent scarring atelectasis or less likely lung malignancy. Follow-up with CT of the chest in 6 months may be considered. Abnormal appearance  of the liver with large geographic areas of hypoattenuation involving approximately 80% of the visualized liver parenchyma. This may represent a perfusion anomaly versus widespread infiltrative process such as metastatic disease, primary liver  malignancy or infectious infiltrate. The pattern of abnormality suggests perfusion abnormality. CT angiogram of the abdomen may be considered for further evaluation. Electronically Signed: By: Ted Mcalpine M.D. On: 05/09/2016 13:33   US Abdomen Limited Ruq  Result Date: 05/22/2016 CLINICAL DATA:  Pneumonia. Elevated liver function studies. Dehydration for 2 days. EXAM: US ABDOMEN LIMITED - RIGHT UPPER QUADRANT COMPARISON:  None. FINDINGS: Gallbladder: Prominent fold along the mid body of the gallbladder. No stones or sludge identified. Mild gallbladder wall thickening is nonspecific. No edema. Murphy's sign is negative. Common bile duct: Diameter: 4.7 mm, normal Liver: Diffusely increased echotexture throughout the liver suggesting fatty infiltration. IMPRESSION: Mild gallbladder wall thickening is nonspecific. No cholelithiasis or other evidence of cholecystitis. Diffuse fatty infiltration of the liver. Electronically Signed   By: Burman Nieves M.D.   On: 05/22/2016 01:41     Subjective:  Discharge Exam: Vitals:   05/26/16 0611 05/26/16 1500 05/26/16 2230 05/27/16 0623  BP: 107/80 102/80 100/74 106/80  Pulse: (!) 106 87 95 (!) 102  Resp: 16 18 18 16   Temp: 99.1 F (37.3 C) 98.9 F (37.2 C) 98.9 F (37.2 C) 98.7 F (37.1 C)  TempSrc: Oral Oral Oral Oral  SpO2: 93% 94% 95% 94%  Weight:      Height:       General: Pt is alert, awake, not in acute distress Cardiovascular: RRR, S1/S2 +, no rubs, no gallops Respiratory: CTA bilaterally, no wheezing, no rhonchi Abdominal: Soft, NT, ND, bowel sounds + Extremities: no edema, no cyanosis   The results of significant diagnostics from this hospitalization (including imaging, microbiology, ancillary and laboratory) are listed below for reference.    Microbiology: Recent Results (from the past 240 hour(s))  Culture, blood (Routine x 2)     Status: None   Collection Time: 05/21/16  8:54 PM  Result Value Ref Range Status   Specimen  Description BLOOD LEFT ANTECUBITAL  Final   Special Requests BOTTLES DRAWN AEROBIC AND ANAEROBIC 5CC  Final   Culture   Final    NO GROWTH 5 DAYS Performed at [facility], [address] [place], [place] [address]    Report Status 05/26/2016 FINAL  Final  Culture, blood (Routine x 2)     Status: None   Collection Time: 05/21/16  9:20 PM  Result Value Ref Range Status   Specimen Description BLOOD LEFT FOREARM  Final   Special Requests BOTTLES DRAWN AEROBIC AND ANAEROBIC 5CC  Final   Culture   Final    NO GROWTH 5 DAYS Performed at [facility], [address] [place], [place] [address]    Report Status 05/26/2016 FINAL  Final  MRSA PCR Screening     Status: None   Collection Time: 05/22/16 12:30 AM  Result Value Ref Range Status   MRSA by PCR NEGATIVE NEGATIVE Final    Comment:        The GeneXpert MRSA Assay (FDA approved for NASAL specimens only), is one component of a comprehensive MRSA colonization surveillance program. It is not intended to diagnose MRSA infection nor to guide or monitor treatment for MRSA infections.      Labs: BNP (last 3 results) No results for input(s): BNP in the last 8760 hours. Basic Metabolic Panel:  Recent Labs Lab  05/22/16 0050  05/23/16 0346 05/24/16 0555 05/25/16 0522 05/26/16 0513 05/27/16 0514  NA  --   < > 134* 135 136 133* 135  K  --   < > 5.5* 5.1 4.3 3.6 3.5  CL  --   < > 109 106 103 100* 100*  CO2  --   < > 22 24 27 28 29   GLUCOSE  --   < > 76 90 81 86 101*  BUN  --   < > 10 10 8 9 11   CREATININE  --   < > 0.48 0.42* 0.40* 0.44 0.44  CALCIUM  --   < > 7.1* 8.0* 8.0* 7.8* 8.2*  MG 0.9*  --  2.0  --   --   --   --   < > = values in this interval not displayed. Liver Function Tests:  Recent Labs Lab 05/21/16 2055 05/22/16 0055 05/25/16 0522  AST 205* 197* 125*  ALT 27 25 34  ALKPHOS 191* 165* 265*  BILITOT 1.3* 1.4* 1.3*  PROT 5.7* 5.0* 4.9*  ALBUMIN 2.2* 1.8* 1.7*   No results for input(s):  LIPASE, AMYLASE in the last 168 hours. No results for input(s): AMMONIA in the last 168 hours. CBC:  Recent Labs Lab 05/21/16 2055 05/22/16 0055  05/22/16 1141 05/23/16 0346 05/24/16 0555 05/25/16 0522 05/26/16 0513  WBC 14.1* 9.6  --   --  8.9 8.5 7.3 6.9  NEUTROABS 12.4*  --   --   --   --   --   --   --   HGB 7.1* 10.4*  --  11.1* 11.8* 11.9* 11.8* 11.4*  HCT 19.8* 29.4*  < > 31.3* 32.8* 35.3* 33.8* 32.8*  MCV 99.5 102.1*  --   --  96.2 99.7 99.4 98.8  PLT 380 273  --   --  208 237 203 213  < > = values in this interval not displayed. Cardiac Enzymes: No results for input(s): CKTOTAL, CKMB, CKMBINDEX, TROPONINI in the last 168 hours. BNP: Invalid input(s): POCBNP CBG: No results for input(s): GLUCAP in the last 168 hours. D-Dimer No results for input(s): DDIMER in the last 72 hours. Hgb A1c No results for input(s): HGBA1C in the last 72 hours. Lipid Profile No results for input(s): CHOL, HDL, LDLCALC, TRIG, CHOLHDL, LDLDIRECT in the last 72 hours. Thyroid function studies No results for input(s): TSH, T4TOTAL, T3FREE, THYROIDAB in the last 72 hours.  Invalid input(s): FREET3 Anemia work up No results for input(s): VITAMINB12, FOLATE, FERRITIN, TIBC, IRON, RETICCTPCT in the last 72 hours. Urinalysis    Component Value Date/Time   COLORURINE AMBER (A) 05/22/2016 1025   APPEARANCEUR CLOUDY (A) 05/22/2016 1025   LABSPEC 1.020 05/22/2016 1025   PHURINE 5.0 05/22/2016 1025   GLUCOSEU NEGATIVE 05/22/2016 1025   HGBUR MODERATE (A) 05/22/2016 1025   BILIRUBINUR SMALL (A) 05/22/2016 1025   KETONESUR NEGATIVE 05/22/2016 1025   PROTEINUR NEGATIVE 05/22/2016 1025   UROBILINOGEN 1.0 01/11/2010 1611   NITRITE NEGATIVE 05/22/2016 1025   LEUKOCYTESUR MODERATE (A) 05/22/2016 1025   Sepsis Labs Invalid input(s): PROCALCITONIN,  WBC,  LACTICIDVEN Microbiology Recent Results (from the past 240 hour(s))  Culture, blood (Routine x 2)     Status: None   Collection Time: 05/21/16   8:54 PM  Result Value Ref Range Status   Specimen Description BLOOD LEFT ANTECUBITAL  Final   Special Requests BOTTLES DRAWN AEROBIC AND ANAEROBIC 5CC  Final   Culture   Final  NO GROWTH 5 DAYS Performed at [facility], [address] [place], [place] [address]    Report Status 05/26/2016 FINAL  Final  Culture, blood (Routine x 2)     Status: None   Collection Time: 05/21/16  9:20 PM  Result Value Ref Range Status   Specimen Description BLOOD LEFT FOREARM  Final   Special Requests BOTTLES DRAWN AEROBIC AND ANAEROBIC 5CC  Final   Culture   Final    NO GROWTH 5 DAYS Performed at [facility], [address] [place], [place] [address]    Report Status 05/26/2016 FINAL  Final  MRSA PCR Screening     Status: None   Collection Time: 05/22/16 12:30 AM  Result Value Ref Range Status   MRSA by PCR NEGATIVE NEGATIVE Final    Comment:        The GeneXpert MRSA Assay (FDA approved for NASAL specimens only), is one component of a comprehensive MRSA colonization surveillance program. It is not intended to diagnose MRSA infection nor to guide or monitor treatment for MRSA infections.      Time coordinating discharge: Over 30 minutes  SIGNED:   Clint Lipps, MD  [facility] 05/27/2016, 10:50 AM Pager   If 7PM-7AM, please contact night-coverage [email or web address] Password [email or web address]

## 2016-05-27 NOTE — Progress Notes (Signed)
Speech Language Pathology Treatment: Dysphagia  Patient Details Name: UNNAMED HINO MRN: [id number] DOB: [birth year] Today's Date: 05/27/2016 Time: 0900-0910 SLP Time Calculation (min) (ACUTE ONLY): 10 min  Assessment / Plan / Recommendation Clinical Impression  Pt seen at bedside for assessment of diet tolerance and education regarding safe swallow precautions. Pt was observed during breakfast, eating multi-consistency solids (cereal and milk). No overt s/s aspiration observed or reported. RN indicates poor intake but tolerating current diet. Safe swallow precautions were reviewed with pt, specifically, upright position, small bites/sips, no straws. Pt reports she is ready to be discharged today. ST will sign off at this time. Please reconsult if needs arise.    HPI HPI: 66 y.o.femalewith no significantpast medical history except a perforated viscus a few years ago, repaired and alcoholismwho presents with sepsios secondary to flu and suspected PNA.      SLP Plan  Discharge SLP treatment due to: All goals met     Recommendations  Diet recommendations: Regular;Thin liquid Liquids provided via: No straw;Cup Medication Administration: Whole meds with puree Supervision: Patient able to self feed Compensations: Minimize environmental distractions;Slow rate;Small sips/bites Postural Changes and/or Swallow Maneuvers: Seated upright 90 degrees         Oral Care Recommendations: Oral care BID Follow up Recommendations: 24 hour supervision/assistance Plan: Discharge SLP treatment due to (comment);All goals met       [place] B. Quentin Ore [facility], CCC-SLP [phone]  Shonna Chock 05/27/2016, 9:14 AM

## 2016-05-27 NOTE — Progress Notes (Signed)
[facility] Per daughter request via car. Discharge instructions given to daughter. Melton Alarana A Queen Abbett, RN

## 2017-03-07 MED FILL — MELOXICAM 15 MG TABLET: 15 | 30 days supply | Qty: 30 | Fill #0

## 2017-03-11 ENCOUNTER — Other Ambulatory Visit: Payer: Self-pay | Admitting: Family Medicine

## 2017-03-11 DIAGNOSIS — M25562 Pain in left knee: Secondary | ICD-10-CM

## 2017-03-13 ENCOUNTER — Other Ambulatory Visit: Payer: Self-pay

## 2017-05-22 ENCOUNTER — Other Ambulatory Visit: Payer: Self-pay | Admitting: Family Medicine

## 2017-05-22 DIAGNOSIS — M25562 Pain in left knee: Secondary | ICD-10-CM

## 2017-06-24 ENCOUNTER — Emergency Department (HOSPITAL_COMMUNITY): Payer: Medicare Other

## 2017-06-24 ENCOUNTER — Other Ambulatory Visit: Payer: Self-pay

## 2017-06-24 ENCOUNTER — Encounter (HOSPITAL_COMMUNITY): Payer: Self-pay | Admitting: Internal Medicine

## 2017-06-24 ENCOUNTER — Inpatient Hospital Stay (HOSPITAL_COMMUNITY)
Admission: EM | Admit: 2017-06-24 | Discharge: 2017-06-26 | DRG: 291 | Disposition: A | Discharge status: Home / Self Care | Payer: Medicare Other | Attending: Internal Medicine | Admitting: Internal Medicine

## 2017-06-24 DIAGNOSIS — Z8701 Personal history of pneumonia (recurrent): Secondary | ICD-10-CM

## 2017-06-24 DIAGNOSIS — R937 Abnormal findings on diagnostic imaging of other parts of musculoskeletal system: Secondary | ICD-10-CM | POA: Diagnosis present

## 2017-06-24 DIAGNOSIS — Z9889 Other specified postprocedural states: Secondary | ICD-10-CM

## 2017-06-24 DIAGNOSIS — J440 Chronic obstructive pulmonary disease with acute lower respiratory infection: Secondary | ICD-10-CM | POA: Diagnosis present

## 2017-06-24 DIAGNOSIS — Z7289 Other problems related to lifestyle: Secondary | ICD-10-CM | POA: Diagnosis present

## 2017-06-24 DIAGNOSIS — J90 Pleural effusion, not elsewhere classified: Secondary | ICD-10-CM

## 2017-06-24 DIAGNOSIS — R918 Other nonspecific abnormal finding of lung field: Secondary | ICD-10-CM | POA: Diagnosis not present

## 2017-06-24 DIAGNOSIS — Z789 Other specified health status: Secondary | ICD-10-CM | POA: Diagnosis not present

## 2017-06-24 DIAGNOSIS — R0602 Shortness of breath: Secondary | ICD-10-CM | POA: Diagnosis not present

## 2017-06-24 DIAGNOSIS — J181 Lobar pneumonia, unspecified organism: Secondary | ICD-10-CM | POA: Diagnosis present

## 2017-06-24 DIAGNOSIS — Z79899 Other long term (current) drug therapy: Secondary | ICD-10-CM

## 2017-06-24 DIAGNOSIS — I272 Pulmonary hypertension, unspecified: Secondary | ICD-10-CM | POA: Diagnosis present

## 2017-06-24 DIAGNOSIS — F1010 Alcohol abuse, uncomplicated: Secondary | ICD-10-CM | POA: Diagnosis present

## 2017-06-24 DIAGNOSIS — Z880 Allergy status to penicillin: Secondary | ICD-10-CM

## 2017-06-24 DIAGNOSIS — I5031 Acute diastolic (congestive) heart failure: Secondary | ICD-10-CM | POA: Diagnosis not present

## 2017-06-24 DIAGNOSIS — I509 Heart failure, unspecified: Secondary | ICD-10-CM

## 2017-06-24 DIAGNOSIS — Z881 Allergy status to other antibiotic agents status: Secondary | ICD-10-CM

## 2017-06-24 DIAGNOSIS — R296 Repeated falls: Secondary | ICD-10-CM | POA: Diagnosis present

## 2017-06-24 DIAGNOSIS — J918 Pleural effusion in other conditions classified elsewhere: Secondary | ICD-10-CM | POA: Diagnosis present

## 2017-06-24 DIAGNOSIS — F17210 Nicotine dependence, cigarettes, uncomplicated: Secondary | ICD-10-CM | POA: Diagnosis present

## 2017-06-24 HISTORY — DX: Heart failure, unspecified: I50.9

## 2017-06-24 HISTORY — DX: Alcohol abuse, uncomplicated: F10.10

## 2017-06-24 LAB — BRAIN NATRIURETIC PEPTIDE: B Natriuretic Peptide: 1266.6 pg/mL — ABNORMAL HIGH (ref 0.0–100.0)

## 2017-06-24 LAB — CBC WITH DIFFERENTIAL/PLATELET
Basophils Absolute: 0 10*3/uL (ref 0.0–0.1)
Basophils Relative: 0 %
EOS ABS: 0.1 10*3/uL (ref 0.0–0.7)
Eosinophils Relative: 1 %
HEMATOCRIT: 36.8 % (ref 36.0–46.0)
HEMOGLOBIN: 12.1 g/dL (ref 12.0–15.0)
LYMPHS ABS: 1.6 10*3/uL (ref 0.7–4.0)
Lymphocytes Relative: 23 %
MCH: 27.3 pg (ref 26.0–34.0)
MCHC: 32.9 g/dL (ref 30.0–36.0)
MCV: 82.9 fL (ref 78.0–100.0)
Monocytes Absolute: 0.8 10*3/uL (ref 0.1–1.0)
Monocytes Relative: 11 %
NEUTROS ABS: 4.6 10*3/uL (ref 1.7–7.7)
NEUTROS PCT: 65 %
Platelets: 243 10*3/uL (ref 150–400)
RBC: 4.44 MIL/uL (ref 3.87–5.11)
RDW: 14.9 % (ref 11.5–15.5)
WBC: 7 10*3/uL (ref 4.0–10.5)

## 2017-06-24 LAB — COMPREHENSIVE METABOLIC PANEL
ALBUMIN: 2.9 g/dL — AB (ref 3.5–5.0)
ALT: 15 U/L (ref 14–54)
AST: 43 U/L — ABNORMAL HIGH (ref 15–41)
Alkaline Phosphatase: 181 U/L — ABNORMAL HIGH (ref 38–126)
Anion gap: 10 (ref 5–15)
BUN: 19 mg/dL (ref 6–20)
CO2: 24 mmol/L (ref 22–32)
Calcium: 9 mg/dL (ref 8.9–10.3)
Chloride: 104 mmol/L (ref 101–111)
Creatinine, Ser: 0.58 mg/dL (ref 0.44–1.00)
GFR calc Af Amer: >60 mL/min (ref >60)
GFR calc non Af Amer: >60 mL/min (ref >60)
GLUCOSE: 110 mg/dL — AB (ref 65–99)
POTASSIUM: 3.8 mmol/L (ref 3.5–5.1)
SODIUM: 138 mmol/L (ref 135–145)
Total Bilirubin: 0.7 mg/dL (ref 0.3–1.2)
Total Protein: 7 g/dL (ref 6.5–8.1)

## 2017-06-24 LAB — I-STAT TROPONIN, ED: TROPONIN I, POC: 0.03 ng/mL (ref 0.00–0.08)

## 2017-06-24 LAB — TROPONIN I: Troponin I: 0.04 ng/mL (ref <0.03)

## 2017-06-24 MED ORDER — ADULT MULTIVITAMIN W/MINERALS CH
1.0000 | ORAL_TABLET | Freq: Every day | ORAL | Status: DC
  Administered 2017-06-25 – 2017-06-26 (×2): 1 via ORAL
  Filled 2017-06-24 (×2): qty 1

## 2017-06-24 MED ORDER — SODIUM CHLORIDE 0.9% FLUSH: 3.0000 mL | INTRAVENOUS | Status: DC | PRN

## 2017-06-24 MED ORDER — SODIUM CHLORIDE 0.9 % IV SOLN: 250.0000 mL | INTRAVENOUS | Status: DC | PRN

## 2017-06-24 MED ORDER — ONDANSETRON HCL 4 MG/2ML IJ SOLN: 4.0000 mg | Freq: Four times a day (QID) | INTRAMUSCULAR | Status: DC | PRN

## 2017-06-24 MED ORDER — LORAZEPAM 2 MG/ML IJ SOLN: 1.0000 mg | Freq: Four times a day (QID) | INTRAMUSCULAR | Status: DC | PRN

## 2017-06-24 MED ORDER — FUROSEMIDE 10 MG/ML IJ SOLN: 40.0000 mg | Freq: Every day | INTRAMUSCULAR | Status: DC

## 2017-06-24 MED ORDER — SODIUM CHLORIDE 0.9% FLUSH
3.0000 mL | Freq: Two times a day (BID) | INTRAVENOUS | Status: DC
  Administered 2017-06-25 – 2017-06-26 (×4): 3 mL via INTRAVENOUS

## 2017-06-24 MED ORDER — FOLIC ACID 1 MG PO TABS
1.0000 mg | ORAL_TABLET | Freq: Every day | ORAL | Status: DC
  Administered 2017-06-25 – 2017-06-26 (×2): 1 mg via ORAL
  Filled 2017-06-24 (×2): qty 1

## 2017-06-24 MED ORDER — ENOXAPARIN SODIUM 40 MG/0.4ML ~~LOC~~ SOLN
40.0000 mg | Freq: Every day | SUBCUTANEOUS | Status: DC
  Filled 2017-06-24 (×2): qty 0.4

## 2017-06-24 MED ORDER — VITAMIN B-1 100 MG PO TABS
100.0000 mg | ORAL_TABLET | Freq: Every day | ORAL | Status: DC
  Administered 2017-06-25 – 2017-06-26 (×2): 100 mg via ORAL
  Filled 2017-06-24 (×2): qty 1

## 2017-06-24 MED ORDER — ACETAMINOPHEN 325 MG PO TABS
650.0000 mg | ORAL_TABLET | ORAL | Status: DC | PRN
  Administered 2017-06-25 – 2017-06-26 (×3): 650 mg via ORAL
  Filled 2017-06-24 (×3): qty 2

## 2017-06-24 MED ORDER — ALBUTEROL SULFATE (2.5 MG/3ML) 0.083% IN NEBU: 5.0000 mg | INHALATION_SOLUTION | Freq: Once | RESPIRATORY_TRACT | Status: DC

## 2017-06-24 MED ORDER — LORAZEPAM 1 MG PO TABS: 1.0000 mg | ORAL_TABLET | Freq: Four times a day (QID) | ORAL | Status: DC | PRN

## 2017-06-24 MED ORDER — THIAMINE HCL 100 MG/ML IJ SOLN: 100.0000 mg | Freq: Every day | INTRAMUSCULAR | Status: DC

## 2017-06-24 MED ORDER — FUROSEMIDE 10 MG/ML IJ SOLN
20.0000 mg | Freq: Once | INTRAMUSCULAR | Status: AC
Start: 2017-06-24 — End: 2017-06-25
  Administered 2017-06-25: 20 mg via INTRAVENOUS
  Filled 2017-06-24: qty 2

## 2017-06-24 NOTE — ED Triage Notes (Signed)
Pt states she had a cxr done in the office and does not want one here.

## 2017-06-24 NOTE — ED Notes (Signed)
Patient states no prior cardiac hx or hx of COPD. Patient states leg swelling has been present for a week and respiratory symptoms for a couple of days however, daughter at bedside states she has been coughing for a couple of weeks. Patient SOB after coughing fits.

## 2017-06-24 NOTE — ED Notes (Signed)
Date and time results received: 06/24/17 2257 (use smartphrase ".now" to insert current time)  Test: Troponin  Critical Value: 0.04ng/mL  Name of Provider Notified: Dr. Laban EmperorJ. Gardner (Hospitalist) and Dorene GrebeNatalie, RN (primary nurse).   Orders Received? Or Actions Taken?: Will continue to monitor the patient and await new orders.

## 2017-06-24 NOTE — ED Provider Notes (Signed)
[facility]-EMERGENCY DEPT Provider Note   CSN: [id number] Arrival date & time: 06/24/17  1504     History   Chief Complaint Chief Complaint  Patient presents with  . Shortness of Breath  . Leg Swelling    HPI Rachel Williams is a 67 y.o. female.  HPI  complains of shortness of breath and nonproductive cough for the past 1 day.  Also complains of bilateral leg swelling for the past 1 week.  Shortness of breath is worse with exertion and improved with rest.  She denies any fever.  .  Seen by [facility] earlier today sent here for further evaluation.  She received a DuoNeb nebulized treatment at [facility] physicians prior to coming here with partial relief.  Denies any chest pain.  Denies leg pain denies pain anywhere.  No other associated symptoms Past Medical History:  Diagnosis Date  . Pneumonia     Patient Active Problem List   Diagnosis Date Noted  . Sepsis, unspecified organism (HCC) 05/21/2016  . Elevated LFTs 05/21/2016  . Macrocytic anemia 05/21/2016  . Influenza A 05/21/2016  . Malnutrition of moderate degree 05/29/2015  . NSVT (nonsustained ventricular tachycardia) (HCC) 05/29/2015  . Hyponatremia 05/27/2015  . Recurrent falls 05/27/2015  . Facial bruising 05/27/2015  . Hypomagnesemia 05/27/2015  . Hypokalemia 05/27/2015  . Alcohol use (HCC), daily 2-3 drinks of vodka 05/27/2015  . Smoker 05/27/2015  . Dehydration 05/27/2015  . Vertebral compression fracture (HCC) 05/27/2015  . Acute pharyngitis 05/27/2015  . Chronic constipation 05/27/2015  . Generalized weakness    COPD Past Surgical History:  Procedure Laterality Date  . COLON RESECTION     perforation  . COLOSTOMY      OB History    No data available       Home Medications    Prior to Admission medications   Medication Sig Start Date End Date Taking? Authorizing Provider  Magnesium Oxide 200 MG TABS Take 1 tablet (200 mg total) by mouth daily. 05/27/16   Clydia Llano,  MD  Multiple Vitamin (MULTIVITAMIN WITH MINERALS) TABS tablet Take 1 tablet by mouth daily. 05/27/16   Clydia Llano, MD    Family History Family History  Problem Relation Age of Onset  . Alzheimer's disease Mother   . Cancer Sister   . Cirrhosis Father   . Hemochromatosis Other     Social History Social History   Tobacco Use  . Smoking status: Current Every Day Smoker    Packs/day: 1.00    Years: 50.00    Pack years: 50.00    Types: Cigarettes  . Smokeless tobacco: Never Used  Substance Use Topics  . Alcohol use: Yes    Alcohol/week: 8.4 oz    Types: 14 Standard drinks or equivalent per week    Comment: daily, >6 per daughter  . Drug use: No     Allergies   Ciprofloxacin; Levofloxacin; Nickel; and Penicillins   Review of Systems Review of Systems  Constitutional: Negative.   HENT: Negative.   Respiratory: Positive for cough and shortness of breath.   Cardiovascular: Positive for leg swelling.  Gastrointestinal: Negative.   Musculoskeletal: Negative.   Skin: Negative.   Neurological: Negative.   Psychiatric/Behavioral: Negative.   All other systems reviewed and are negative.    Physical Exam Updated Vital Signs BP (!) 152/74 (BP Location: Left Arm)   Pulse (!) 113   Temp 98.7 F (37.1 C)   Resp (!) 24   Ht 5\' 2"  (  1.575 m)   Wt 65 kg (143 lb 6.4 oz)   SpO2 96%   BMI 26.23 kg/m   Physical Exam  Constitutional: No distress.  Chronically ill-appearing  HENT:  Head: Normocephalic and atraumatic.  Eyes: Conjunctivae are normal. Pupils are equal, round, and reactive to light.  Neck: Neck supple. JVD present. No tracheal deviation present. No thyromegaly present.  Cardiovascular: Regular rhythm.  No murmur heard. Mildly tachycardic  Pulmonary/Chest: Effort normal. She has wheezes.  Called and expiratory wheezes  Abdominal: Soft. Bowel sounds are normal. She exhibits no distension. There is no tenderness.  Musculoskeletal: Normal range of motion. She  exhibits edema. She exhibits no tenderness.  3+ pretibial pitting edema bilaterally  Neurological: She is alert. Coordination normal.  Skin: Skin is warm and dry. No rash noted.  Psychiatric: She has a normal mood and affect.  Nursing note and vitals reviewed.    ED Treatments / Results  Labs (all labs ordered are listed, but only abnormal results are displayed) Labs Reviewed  CBC WITH DIFFERENTIAL/PLATELET  BRAIN NATRIURETIC PEPTIDE  COMPREHENSIVE METABOLIC PANEL  I-STAT TROPONIN, ED    EKG  EKG Interpretation  Date/Time:  Tuesday June 24 2017 15:52:19 EDT Ventricular Rate:  103 PR Interval:    QRS Duration: 126 QT Interval:  328 QTC Calculation: 430 R Axis:   96 Text Interpretation:  Sinus tachycardia RBBB and LPFB No significant change since last tracing Confirmed by Doug Sou [phone]) on 06/24/2017 7:50:08 PM       Radiology No results found.  Procedures Procedures (including critical care time)  Medications Ordered in ED Medications  albuterol (PROVENTIL) (2.5 MG/3ML) 0.083% nebulizer solution 5 mg (5 mg Nebulization Not Given 06/24/17 1844)   Chest x-ray viewed by me. Results for orders placed or performed during the hospital encounter of 06/24/17  CBC with Differential/Platelet  Result Value Ref Range   WBC 7.0 4.0 - 10.5 K/uL   RBC 4.44 3.87 - 5.11 MIL/uL   Hemoglobin 12.1 12.0 - 15.0 g/dL   HCT 60.4 54.0 - 98.1 %   MCV 82.9 78.0 - 100.0 fL   MCH 27.3 26.0 - 34.0 pg   MCHC 32.9 30.0 - 36.0 g/dL   RDW 19.1 47.8 - 29.5 %   Platelets 243 150 - 400 K/uL   Neutrophils Relative % 65 %   Neutro Abs 4.6 1.7 - 7.7 K/uL   Lymphocytes Relative 23 %   Lymphs Abs 1.6 0.7 - 4.0 K/uL   Monocytes Relative 11 %   Monocytes Absolute 0.8 0.1 - 1.0 K/uL   Eosinophils Relative 1 %   Eosinophils Absolute 0.1 0.0 - 0.7 K/uL   Basophils Relative 0 %   Basophils Absolute 0.0 0.0 - 0.1 K/uL  Brain natriuretic peptide  Result Value Ref Range   B Natriuretic  Peptide 1,266.6 (H) 0.0 - 100.0 pg/mL  Comprehensive metabolic panel  Result Value Ref Range   Sodium 138 135 - 145 mmol/L   Potassium 3.8 3.5 - 5.1 mmol/L   Chloride 104 101 - 111 mmol/L   CO2 24 22 - 32 mmol/L   Glucose, Bld 110 (H) 65 - 99 mg/dL   BUN 19 6 - 20 mg/dL   Creatinine, Ser 6.21 0.44 - 1.00 mg/dL   Calcium 9.0 8.9 - 30.8 mg/dL   Total Protein 7.0 6.5 - 8.1 g/dL   Albumin 2.9 (L) 3.5 - 5.0 g/dL   AST 43 (H) 15 - 41 U/L   ALT 15 14 -  54 U/L   Alkaline Phosphatase 181 (H) 38 - 126 U/L   Total Bilirubin 0.7 0.3 - 1.2 mg/dL   GFR calc non Af Amer >60 >60 mL/min   GFR calc Af Amer >60 >60 mL/min   Anion gap 10 5 - 15  Troponin I (q 6hr x 3)  Result Value Ref Range   Troponin I 0.04 (HH) <0.03 ng/mL  I-stat troponin, ED  Result Value Ref Range   Troponin i, poc 0.03 0.00 - 0.08 ng/mL   Comment 3           Dg Chest 2 View  Result Date: 06/24/2017 CLINICAL DATA:  Cough and dyspnea EXAM: CHEST - 2 VIEW COMPARISON:  06/24/2017 CXR, chest CT 05/09/2016 FINDINGS: Stable borderline cardiomegaly. Minimal aortic atherosclerosis without aneurysm. Obscuration of the right heart border and right hemidiaphragm likely on the basis of right middle lobe consolidation and bibasilar atelectasis. IMPRESSION: Cardiomegaly with central pulmonary vascular congestion. Right middle lobe consolidation and bibasilar atelectasis. No significant change from earlier exam. Findings could represent stigmata of an endobronchial obstruction from mucus or an endobronchial lesion potentially. Electronically Signed   By: Tollie Ethavid  Kwon M.D.   On: 06/24/2017 20:48   Initial Impression / Assessment and Plan / ED Course  I have reviewed the triage vital signs and the nursing notes.  Pertinent labs & imaging results that were available during my care of the patient were reviewed by me and considered in my medical decision making (see chart for details).      Lab work consistent with congestive heart failure as  his history physical exam.  History and exam more consistent with congestive heart failure and pneumonia.  IV Lasix ordered.  Dr. Julian ReilGardner consulted and will arrange for overnight stay Final Clinical Impressions(s) / ED Diagnoses  DX CONGESTIVE HEART FAILURE Final diagnoses:  None    ED Discharge Orders    None       Doug SouJacubowitz, Artemio Dobie, MD 06/24/17 2312

## 2017-06-24 NOTE — ED Notes (Signed)
Patient placed on 2L 02 due to 02 saturation remaining under 90%.

## 2017-06-24 NOTE — ED Notes (Signed)
ED TO INPATIENT HANDOFF REPORT  Name/Age/Gender Rachel Williams 67 y.o. female  Code Status    Code Status Orders  (From admission, onward)        Start     Ordered   06/24/17 2231  Full code  Continuous     06/24/17 2232    Code Status History    Date Active Date Inactive Code Status Order ID Comments User Context   05/22/2016 00:14 05/27/2016 15:54 Full Code [id number]  Edwin Dada, MD Inpatient   03/11/2016 22:15 03/13/2016 15:29 Full Code [id number]  Louellen Molder, MD Inpatient   05/27/2015 16:52 05/30/2015 14:02 Full Code [id number]  Lavina Hamman, MD ED      Home/SNF/Other Home  Chief Complaint pneumonia; swelling in both legs; sob  Level of Care/Admitting Diagnosis ED Disposition    ED Disposition Condition [place]: [place] [100102]  Level of Care: Telemetry [5]  Admit to tele based on following criteria: Acute CHF  Diagnosis: Acute CHF (congestive heart failure) [facility]) [[id number]]  Admitting Physician: Etta Quill [phone]  Attending Physician: Etta Quill [[id number]]  PT Class (Do Not Modify): Observation [104]  PT Acc Code (Do Not Modify): Observation [10022]       Medical History Past Medical History:  Diagnosis Date  . Acute CHF (congestive heart failure) ([place]) 06/24/2017  . ETOH abuse   . Pneumonia     Allergies Allergies  Allergen Reactions  . Ciprofloxacin Rash  . Levofloxacin Rash    Rash on back after 5 days therapy.   . Nickel Rash    Anything with nickel in it.   Marland Kitchen Penicillins Rash    Has patient had a PCN reaction causing immediate rash, facial/tongue/throat swelling, SOB or lightheadedness with hypotension: No Has patient had a PCN reaction causing severe rash involving mucus membranes or skin necrosis: No Ha patient had a PCN reaction that required hospitalization No Has patient had a PCN reaction occurring within the last 10 years: No If all of the above answers are "NO", then  may proceed with Cephalosporin use. Had simple rash around age 1, needed no further care      IV Location/Drains/Wounds Patient Lines/Drains/Airways Status   Active Line/Drains/Airways    None          Labs/Imaging Results for orders placed or performed during the hospital encounter of 06/24/17 (from the past 48 hour(s))  CBC with Differential/Platelet     Status: None   Collection Time: 06/24/17  8:49 PM  Result Value Ref Range   WBC 7.0 4.0 - 10.5 K/uL   RBC 4.44 3.87 - 5.11 MIL/uL   Hemoglobin 12.1 12.0 - 15.0 g/dL   HCT 36.8 36.0 - 46.0 %   MCV 82.9 78.0 - 100.0 fL   MCH 27.3 26.0 - 34.0 pg   MCHC 32.9 30.0 - 36.0 g/dL   RDW 14.9 11.5 - 15.5 %   Platelets 243 150 - 400 K/uL   Neutrophils Relative % 65 %   Neutro Abs 4.6 1.7 - 7.7 K/uL   Lymphocytes Relative 23 %   Lymphs Abs 1.6 0.7 - 4.0 K/uL   Monocytes Relative 11 %   Monocytes Absolute 0.8 0.1 - 1.0 K/uL   Eosinophils Relative 1 %   Eosinophils Absolute 0.1 0.0 - 0.7 K/uL   Basophils Relative 0 %   Basophils Absolute 0.0 0.0 - 0.1 K/uL    Comment: Performed at [facility], [place] [address], [place], [place] [address]  Brain natriuretic peptide     Status: Abnormal   Collection Time: 06/24/17  8:49 PM  Result Value Ref Range   B Natriuretic Peptide 1,266.6 (H) 0.0 - 100.0 pg/mL    Comment: Performed at [facility], [place] [address], [place], [place] [address]  Comprehensive metabolic panel     Status: Abnormal   Collection Time: 06/24/17  8:49 PM  Result Value Ref Range   Sodium 138 135 - 145 mmol/L   Potassium 3.8 3.5 - 5.1 mmol/L   Chloride 104 101 - 111 mmol/L   CO2 24 22 - 32 mmol/L   Glucose, Bld 110 (H) 65 - 99 mg/dL   BUN 19 6 - 20 mg/dL   Creatinine, Ser 0.58 0.44 - 1.00 mg/dL   Calcium 9.0 8.9 - 10.3 mg/dL   Total Protein 7.0 6.5 - 8.1 g/dL   Albumin 2.9 (L) 3.5 - 5.0 g/dL   AST 43 (H) 15 - 41 U/L   ALT 15 14 - 54 U/L   Alkaline Phosphatase 181 (H) 38 -  126 U/L   Total Bilirubin 0.7 0.3 - 1.2 mg/dL   GFR calc non Af Amer >60 >60 mL/min   GFR calc Af Amer >60 >60 mL/min    Comment: (NOTE) The eGFR has been calculated using the CKD EPI equation. This calculation has not been validated in all clinical situations. eGFR's persistently <60 mL/min signify possible Chronic Kidney Disease.    Anion gap 10 5 - 15    Comment: Performed at [facility], [place] [address], [place], [place] [address]  Troponin I (q 6hr x 3)     Status: Abnormal   Collection Time: 06/24/17  8:49 PM  Result Value Ref Range   Troponin I 0.04 (HH) <0.03 ng/mL    Comment: CRITICAL RESULT CALLED TO, READ BACK BY AND VERIFIED WITH: Milas Gain RN 3.19.19 _0  ZANDO,C Performed at [facility], [place] [address], [place], [place] [address]   I-stat troponin, ED     Status: None   Collection Time: 06/24/17  8:56 PM  Result Value Ref Range   Troponin i, poc 0.03 0.00 - 0.08 ng/mL   Comment 3            Comment: Due to the release kinetics of cTnI, a negative result within the first hours of the onset of symptoms does not rule out myocardial infarction with certainty. If myocardial infarction is still suspected, repeat the test at appropriate intervals.    Dg Chest 2 View  Result Date: 06/24/2017 CLINICAL DATA:  Cough and dyspnea EXAM: CHEST - 2 VIEW COMPARISON:  06/24/2017 CXR, chest CT 05/09/2016 FINDINGS: Stable borderline cardiomegaly. Minimal aortic atherosclerosis without aneurysm. Obscuration of the right heart border and right hemidiaphragm likely on the basis of right middle lobe consolidation and bibasilar atelectasis. IMPRESSION: Cardiomegaly with central pulmonary vascular congestion. Right middle lobe consolidation and bibasilar atelectasis. No significant change from earlier exam. Findings could represent stigmata of an endobronchial obstruction from mucus or an endobronchial lesion potentially. Electronically Signed   By: Ashley Royalty M.D.   On: 06/24/2017 20:48    Pending Labs Unresulted Labs (From admission, onward)   Start     Ordered   06/25/17 5732  Basic metabolic panel  Daily,   R     06/24/17 2232   06/24/17 2227  Troponin I (q 6hr x 3)  Now then every 6  hours,   R     06/24/17 2226      Vitals/Pain Today's Vitals   06/24/17 2200 06/24/17 2206 06/24/17 2230 06/24/17 2300  BP: 113/61 113/61 (!) 117/56 116/64  Pulse: (!) 113 (!) 114 (!) 111 (!) 109  Resp: (!) 23 (!) 23 (!) 23 (!) 25  Temp:      SpO2: 94% 95% 95% 94%  Weight:      Height:        Isolation Precautions No active isolations  Medications Medications  albuterol (PROVENTIL) (2.5 MG/3ML) 0.083% nebulizer solution 5 mg (5 mg Nebulization Not Given 06/24/17 1844)  furosemide (LASIX) injection 20 mg (not administered)  sodium chloride flush (NS) 0.9 % injection 3 mL (not administered)  sodium chloride flush (NS) 0.9 % injection 3 mL (not administered)  0.9 %  sodium chloride infusion (not administered)  acetaminophen (TYLENOL) tablet 650 mg (not administered)  ondansetron (ZOFRAN) injection 4 mg (not administered)  enoxaparin (LOVENOX) injection 40 mg (not administered)  furosemide (LASIX) injection 40 mg (not administered)  LORazepam (ATIVAN) tablet 1 mg (not administered)    Or  LORazepam (ATIVAN) injection 1 mg (not administered)  thiamine (VITAMIN B-1) tablet 100 mg (not administered)    Or  thiamine (B-1) injection 100 mg (not administered)  folic acid (FOLVITE) tablet 1 mg (not administered)  multivitamin with minerals tablet 1 tablet (not administered)    Mobility walks with device

## 2017-06-24 NOTE — H&P (Signed)
History and Physical    Rachel Williams ZOX:[id number] DOB: April 23, 1950 DOA: 06/24/2017  PCP: Sigmund Hazel, MD  Patient coming from: Home  I have personally briefly reviewed patient's old medical records in [facility]  Chief Complaint: SOB, leg swelling  HPI: Rachel Williams is a 67 y.o. female with medical history significant of EtOH abuse, prior PNA, smoking.  Patient presents to the ED with complains of shortness of breath and nonproductive cough for the past 1 day.  Also complains of bilateral leg swelling for the past 1 week.  Shortness of breath is worse with exertion and improved with rest.  She denies any fever.  Seen by [facility] physicians earlier today and sent to ED for further eval.  Duoneb at [facility] provided partial relief.   ED Course: CXR shows pulmonary vascular congestion and persistent RML consolidation compared to a year ago.  BNP 1200.  AST 43, ALT 15   Review of Systems: As per HPI otherwise 10 point review of systems negative.   Past Medical History:  Diagnosis Date  . Acute CHF (congestive heart failure) (HCC) 06/24/2017  . ETOH abuse   . Pneumonia     Past Surgical History:  Procedure Laterality Date  . COLON RESECTION     perforation  . COLOSTOMY       reports that she has been smoking cigarettes.  She has a 50.00 pack-year smoking history. she has never used smokeless tobacco. She reports that she drinks about 8.4 oz of alcohol per week. She reports that she does not use drugs.  Allergies  Allergen Reactions  . Ciprofloxacin Rash  . Levofloxacin Rash    Rash on back after 5 days therapy.   . Nickel Rash    Anything with nickel in it.   Marland Kitchen Penicillins Rash    Has patient had a PCN reaction causing immediate rash, facial/tongue/throat swelling, SOB or lightheadedness with hypotension: No Has patient had a PCN reaction causing severe rash involving mucus membranes or skin necrosis: No Ha patient had a PCN reaction that required hospitalization No Has  patient had a PCN reaction occurring within the last 10 years: No If all of the above answers are "NO", then may proceed with Cephalosporin use. Had simple rash around age 77, needed no further care      Family History  Problem Relation Age of Onset  . Alzheimer's disease Mother   . Cancer Sister   . Cirrhosis Father   . Hemochromatosis Other      Prior to Admission medications   Medication Sig Start Date End Date Taking? Authorizing Provider  ibuprofen (ADVIL,MOTRIN) 200 MG tablet Take 400 mg by mouth every 6 (six) hours as needed for moderate pain.   Yes [provider]  Magnesium Oxide 200 MG TABS Take 1 tablet (200 mg total) by mouth daily. Patient not taking: Reported on 06/24/2017 05/27/16   Clydia Llano, MD  Multiple Vitamin (MULTIVITAMIN WITH MINERALS) TABS tablet Take 1 tablet by mouth daily. Patient not taking: Reported on 06/24/2017 05/27/16   Clydia Llano, MD    Physical Exam: Vitals:   06/24/17 1541 06/24/17 1937 06/24/17 2000 06/24/17 2206  BP: (!) 149/64 (!) 152/74 (!) 156/89 113/61  Pulse: (!) 105 (!) 113 (!) 109 (!) 114  Resp: 18 (!) 24 (!) 23 (!) 23  Temp: 98.7 F (37.1 C)     SpO2: 94% 96% 93% 95%  Weight:      Height:  Constitutional: NAD, calm, comfortable Eyes: PERRL, lids and conjunctivae normal ENMT: Mucous membranes are moist. Posterior pharynx clear of any exudate or lesions.Normal dentition.  Neck: normal, supple, no masses, no thyromegaly, JVD Respiratory: Wheezing, crackles Cardiovascular: Mild tachycardia, 3+ BLE edema Abdomen: no tenderness, no masses palpated. No hepatosplenomegaly. Bowel sounds positive.  Musculoskeletal: no clubbing / cyanosis. No joint deformity upper and lower extremities. Good ROM, no contractures. Normal muscle tone.  Skin: no rashes, lesions, ulcers. No induration Neurologic: CN 2-12 grossly intact. Sensation intact, DTR normal. Strength 5/5 in all 4.  Psychiatric: Normal judgment and insight. Alert  and oriented x 3. Normal mood.    Labs on Admission: I have personally reviewed following labs and imaging studies  CBC: Recent Labs  Lab 06/24/17 2049  WBC 7.0  NEUTROABS 4.6  HGB 12.1  HCT 36.8  MCV 82.9  PLT 243   Basic Metabolic Panel: Recent Labs  Lab 06/24/17 2049  NA 138  K 3.8  CL 104  CO2 24  GLUCOSE 110*  BUN 19  CREATININE 0.58  CALCIUM 9.0   GFR: Estimated Creatinine Clearance: 58.8 mL/min (by C-G formula based on SCr of 0.58 mg/dL). Liver Function Tests: Recent Labs  Lab 06/24/17 2049  AST 43*  ALT 15  ALKPHOS 181*  BILITOT 0.7  PROT 7.0  ALBUMIN 2.9*   No results for input(s): LIPASE, AMYLASE in the last 168 hours. No results for input(s): AMMONIA in the last 168 hours. Coagulation Profile: No results for input(s): INR, PROTIME in the last 168 hours. Cardiac Enzymes: No results for input(s): CKTOTAL, CKMB, CKMBINDEX, TROPONINI in the last 168 hours. BNP (last 3 results) No results for input(s): PROBNP in the last 8760 hours. HbA1C: No results for input(s): HGBA1C in the last 72 hours. CBG: No results for input(s): GLUCAP in the last 168 hours. Lipid Profile: No results for input(s): CHOL, HDL, LDLCALC, TRIG, CHOLHDL, LDLDIRECT in the last 72 hours. Thyroid Function Tests: No results for input(s): TSH, T4TOTAL, FREET4, T3FREE, THYROIDAB in the last 72 hours. Anemia Panel: No results for input(s): VITAMINB12, FOLATE, FERRITIN, TIBC, IRON, RETICCTPCT in the last 72 hours. Urine analysis:    Component Value Date/Time   COLORURINE AMBER (A) 05/22/2016 1025   APPEARANCEUR CLOUDY (A) 05/22/2016 1025   LABSPEC 1.020 05/22/2016 1025   PHURINE 5.0 05/22/2016 1025   GLUCOSEU NEGATIVE 05/22/2016 1025   HGBUR MODERATE (A) 05/22/2016 1025   BILIRUBINUR SMALL (A) 05/22/2016 1025   KETONESUR NEGATIVE 05/22/2016 1025   PROTEINUR NEGATIVE 05/22/2016 1025   UROBILINOGEN 1.0 01/11/2010 1611   NITRITE NEGATIVE 05/22/2016 1025   LEUKOCYTESUR MODERATE  (A) 05/22/2016 1025    Radiological Exams on Admission: Dg Chest 2 View  Result Date: 06/24/2017 CLINICAL DATA:  Cough and dyspnea EXAM: CHEST - 2 VIEW COMPARISON:  06/24/2017 CXR, chest CT 05/09/2016 FINDINGS: Stable borderline cardiomegaly. Minimal aortic atherosclerosis without aneurysm. Obscuration of the right heart border and right hemidiaphragm likely on the basis of right middle lobe consolidation and bibasilar atelectasis. IMPRESSION: Cardiomegaly with central pulmonary vascular congestion. Right middle lobe consolidation and bibasilar atelectasis. No significant change from earlier exam. Findings could represent stigmata of an endobronchial obstruction from mucus or an endobronchial lesion potentially. Electronically Signed   By: Tollie Eth M.D.   On: 06/24/2017 20:48    EKG: Independently reviewed.  Assessment/Plan Principal Problem:   Acute CHF (congestive heart failure) (HCC) Active Problems:   Alcohol use (HCC), daily 2-3 drinks of vodka   Right middle lobe  pulmonary infiltrate    1. Acute CHF - no I dont know wether its systolic or diastolic yet until we get the 2d echo. 1. CHF pathway 2. Lasix 40mg  IV daily (got 20 in ED) 3. 2d echo 4. Tele monitor 5. Daily BMP 2. EtOH use - CIWA 3. RML pulm infiltrate - 1. Consider CT chest repeat vs bronchoscopy in AM 2. May wish to curbside pulm in AM  DVT prophylaxis: Lovenox Code Status: Full Family Communication: Daughter at bedside Disposition Plan: Home after admit Consults called: None Admission status: Place in [place]   GARDNER, Heywood IlesJARED M. DO [facility] Pager [phone]  If 7AM-7PM, please contact day team taking care of patient [email or web address] Password [email or web address]  06/24/2017, 10:44 PM

## 2017-06-24 NOTE — ED Triage Notes (Signed)
Pt c/o SOB, cough and edema in lower legs for last week. Worse the past couple days. [facility] to oxygen not coming up from 90% after a duoneb in the office. No pain or fevers.

## 2017-06-25 ENCOUNTER — Observation Stay (HOSPITAL_COMMUNITY): Payer: Medicare Other

## 2017-06-25 ENCOUNTER — Encounter (HOSPITAL_COMMUNITY): Payer: Self-pay | Admitting: *Deleted

## 2017-06-25 ENCOUNTER — Inpatient Hospital Stay (HOSPITAL_COMMUNITY): Payer: Medicare Other

## 2017-06-25 ENCOUNTER — Other Ambulatory Visit: Payer: Self-pay

## 2017-06-25 DIAGNOSIS — I272 Pulmonary hypertension, unspecified: Secondary | ICD-10-CM | POA: Diagnosis not present

## 2017-06-25 DIAGNOSIS — I509 Heart failure, unspecified: Secondary | ICD-10-CM | POA: Diagnosis not present

## 2017-06-25 DIAGNOSIS — R0602 Shortness of breath: Secondary | ICD-10-CM | POA: Diagnosis present

## 2017-06-25 DIAGNOSIS — I5031 Acute diastolic (congestive) heart failure: Secondary | ICD-10-CM | POA: Diagnosis present

## 2017-06-25 DIAGNOSIS — I361 Nonrheumatic tricuspid (valve) insufficiency: Secondary | ICD-10-CM

## 2017-06-25 DIAGNOSIS — J90 Pleural effusion, not elsewhere classified: Secondary | ICD-10-CM | POA: Diagnosis not present

## 2017-06-25 DIAGNOSIS — J918 Pleural effusion in other conditions classified elsewhere: Secondary | ICD-10-CM | POA: Diagnosis present

## 2017-06-25 DIAGNOSIS — Z888 Allergy status to other drugs, medicaments and biological substances status: Secondary | ICD-10-CM | POA: Diagnosis not present

## 2017-06-25 DIAGNOSIS — R296 Repeated falls: Secondary | ICD-10-CM | POA: Diagnosis present

## 2017-06-25 DIAGNOSIS — F1010 Alcohol abuse, uncomplicated: Secondary | ICD-10-CM | POA: Diagnosis present

## 2017-06-25 DIAGNOSIS — Z9889 Other specified postprocedural states: Secondary | ICD-10-CM | POA: Diagnosis not present

## 2017-06-25 DIAGNOSIS — F17210 Nicotine dependence, cigarettes, uncomplicated: Secondary | ICD-10-CM | POA: Diagnosis present

## 2017-06-25 DIAGNOSIS — Z880 Allergy status to penicillin: Secondary | ICD-10-CM | POA: Diagnosis not present

## 2017-06-25 DIAGNOSIS — J189 Pneumonia, unspecified organism: Secondary | ICD-10-CM | POA: Diagnosis present

## 2017-06-25 DIAGNOSIS — R918 Other nonspecific abnormal finding of lung field: Secondary | ICD-10-CM | POA: Diagnosis not present

## 2017-06-25 DIAGNOSIS — Z8701 Personal history of pneumonia (recurrent): Secondary | ICD-10-CM | POA: Diagnosis not present

## 2017-06-25 DIAGNOSIS — M7989 Other specified soft tissue disorders: Secondary | ICD-10-CM | POA: Diagnosis present

## 2017-06-25 DIAGNOSIS — Z79899 Other long term (current) drug therapy: Secondary | ICD-10-CM | POA: Diagnosis not present

## 2017-06-25 LAB — BODY FLUID CELL COUNT WITH DIFFERENTIAL
Lymphs, Fluid: 61 %
Monocyte-Macrophage-Serous Fluid: 36 % — ABNORMAL LOW (ref 50–90)
NEUTROPHIL FLUID: 3 % (ref 0–25)
WBC FLUID: 884 uL (ref 0–1000)

## 2017-06-25 LAB — BASIC METABOLIC PANEL
ANION GAP: 10 (ref 5–15)
BUN: 19 mg/dL (ref 6–20)
CALCIUM: 8.8 mg/dL — AB (ref 8.9–10.3)
CO2: 25 mmol/L (ref 22–32)
Chloride: 105 mmol/L (ref 101–111)
Creatinine, Ser: 0.54 mg/dL (ref 0.44–1.00)
GFR calc non Af Amer: >60 mL/min (ref >60)
GLUCOSE: 93 mg/dL (ref 65–99)
POTASSIUM: 3.6 mmol/L (ref 3.5–5.1)
Sodium: 140 mmol/L (ref 135–145)

## 2017-06-25 LAB — MRSA PCR SCREENING: MRSA BY PCR: NEGATIVE

## 2017-06-25 LAB — TROPONIN I
TROPONIN I: 0.05 ng/mL — AB (ref <0.03)
Troponin I: 0.03 ng/mL (ref <0.03)

## 2017-06-25 LAB — ECHOCARDIOGRAM COMPLETE
HEIGHTINCHES: 62 in
Weight: 2243.2 oz

## 2017-06-25 MED ORDER — IOPAMIDOL (ISOVUE-300) INJECTION 61%
INTRAVENOUS | Status: AC
  Administered 2017-06-25: 75 mL via INTRAVENOUS
  Filled 2017-06-25: qty 75

## 2017-06-25 MED ORDER — LIDOCAINE HCL 1 % IJ SOLN
INTRAMUSCULAR | Status: AC
  Filled 2017-06-25: qty 10

## 2017-06-25 MED ORDER — SODIUM CHLORIDE 0.9 % IV SOLN
500.0000 mg | INTRAVENOUS | Status: DC
  Administered 2017-06-25: 500 mg via INTRAVENOUS
  Filled 2017-06-25 (×2): qty 500

## 2017-06-25 MED ORDER — FUROSEMIDE 10 MG/ML IJ SOLN
40.0000 mg | Freq: Two times a day (BID) | INTRAMUSCULAR | Status: DC
  Administered 2017-06-25 (×2): 40 mg via INTRAVENOUS
  Filled 2017-06-25 (×2): qty 4

## 2017-06-25 MED ORDER — IOPAMIDOL (ISOVUE-300) INJECTION 61%
75.0000 mL | Freq: Once | INTRAVENOUS | Status: AC | PRN
  Administered 2017-06-25: 75 mL via INTRAVENOUS

## 2017-06-25 MED ORDER — GUAIFENESIN ER 600 MG PO TB12
600.0000 mg | ORAL_TABLET | Freq: Two times a day (BID) | ORAL | Status: DC
  Administered 2017-06-25 – 2017-06-26 (×3): 600 mg via ORAL
  Filled 2017-06-25 (×3): qty 1

## 2017-06-25 MED ORDER — GADOBENATE DIMEGLUMINE 529 MG/ML IV SOLN
15.0000 mL | Freq: Once | INTRAVENOUS | Status: AC | PRN
  Administered 2017-06-25: 13 mL via INTRAVENOUS

## 2017-06-25 MED ORDER — SODIUM CHLORIDE 0.9 % IV SOLN
1.0000 g | INTRAVENOUS | Status: DC
  Administered 2017-06-25: 1 g via INTRAVENOUS
  Filled 2017-06-25: qty 10
  Filled 2017-06-25: qty 1

## 2017-06-25 NOTE — Progress Notes (Signed)
  Echocardiogram 2D Echocardiogram has been performed.  Dorena Dewiffany G Fionna Merriott 06/25/2017, 12:13 PM

## 2017-06-25 NOTE — Plan of Care (Signed)
  Progressing Education: Knowledge of General Education information will improve 06/25/2017 0822 - Progressing by Catherine Oak, Alben Spittle, RN Clinical Measurements: Ability to maintain clinical measurements within normal limits will improve 06/25/2017 0822 - Progressing by Sayeed Weatherall, Alben Spittle, RN Activity: Risk for activity intolerance will decrease 06/25/2017 0822 - Progressing by Miangel Flom, Alben Spittle, RN Nutrition: Adequate nutrition will be maintained 06/25/2017 0822 - Progressing by Alden Feagan, Alben Spittle, RN Elimination: Will not experience complications related to bowel motility 06/25/2017 0822 - Progressing by Omaria Plunk, Alben Spittle, RN Pain Managment: General experience of comfort will improve 06/25/2017 0822 - Progressing by Cristino Degroff, Alben Spittle, RN Safety: Ability to remain free from injury will improve 06/25/2017 0822 - Progressing by Bhakti Labella, Alben Spittle, RN Skin Integrity: Risk for impaired skin integrity will decrease 06/25/2017 0822 - Progressing by Shalicia Craghead, Alben Spittle, RN Education: Ability to verbalize understanding of medication therapies will improve 06/25/2017 1610 - Progressing by Payten Hobin, Alben Spittle, RN

## 2017-06-25 NOTE — Progress Notes (Addendum)
PROGRESS NOTE    RUBYANN LINGLE  KGM:[id number] DOB: [birth year] DOA: 06/24/2017 PCP: Sigmund Hazel, MD   Brief Narrative: Patient is a 67 year old female with past medical history of alcohol abuse, prior pneumonia, smoking who presents to the emergency department with complaints of shortness of breath and nonproductive cough for past 1 day she also complains of bilateral leg swelling for last 1 week.  Patient was seen by her PCP and was sent to the emergency department for further evaluation.  Dissection patient has showed pulmonary vascular congestion and persistent right middle lobe consolidation compared to a year ago.  Patient was admitted for possible CHF exacerbation.  Assessment & Plan:   Principal Problem:   Acute CHF (congestive heart failure) (HCC) Active Problems:   Alcohol use (HCC), daily 2-3 drinks of vodka   Right middle lobe pulmonary infiltrate  Possible Acute CHF: Patient has severe bilateral peripheral edema.  Increased BNP on presentation.  No history of CHF in the past.  Echocardiogram has been ordered.  We will follow the report. Started on Lasix 40 mg IV twice daily.  Monitor of input/output, daily weight Salt restriction.  Pneumonia: CT chest showed moderate right pleural effusion with atelectasis of right middle lobe.  Also showed pneumonia on the right side started on ceftriaxone and azithromycin.  Alcohol abuse: Started on CIWA protocol in case of withdrawal.  Possible DISCITIS: CT scan showed multiple compression fractures of the thoracic spine.  Patient does not complain of any pain.  Also showed posssible discitis.  Ordered MRI of the thoracic spine.  Smoker: Patient has actively smoking.  She has a 50-pack-year smoking history.  Counseled for smoking cessation.   DVT prophylaxis:Lovenox Code Status: Full Family Communication: None present at the bedside Disposition Plan: Home after resolution of pneumonia, CHF exacerbation    Consultants:  None  Procedures: None  Antimicrobials: Ceftriaxone and azithromycin since 3/20  Subjective: Patient seen and examined the bedside this morning.  Respiratory status is stable.  Complains of shortness of breath though.  Coughing.  Has severe bilateral peripheral edema on the lower extremities  Objective: Vitals:   06/24/17 2231 06/24/17 2300 06/25/17 0017 06/25/17 0435  BP:  116/64 (!) 127/103 117/64  Pulse:  (!) 109  99  Resp:  (!) 25 (!) 22 (!) 22  Temp:   98.4 F (36.9 C) 98.4 F (36.9 C)  TempSrc:   Axillary Oral  SpO2:  94% 96% 97%  Weight: 64.1 kg (141 lb 6.4 oz)  64.1 kg (141 lb 6.4 oz) 63.6 kg (140 lb 3.2 oz)  Height:   5\' 2"  (1.575 m)     Intake/Output Summary (Last 24 hours) at 06/25/2017 1247 Last data filed at 06/25/2017 6440 Gross per 24 hour  Intake -  Output 650 ml  Net -650 ml   Filed Weights   06/24/17 2231 06/25/17 0017 06/25/17 0435  Weight: 64.1 kg (141 lb 6.4 oz) 64.1 kg (141 lb 6.4 oz) 63.6 kg (140 lb 3.2 oz)    Examination:  General exam: Appears calm and comfortable ,Not in distress,average built HEENT:PERRL,Oral mucosa moist, Ear/Nose normal on gross exam Respiratory system:  Decreased air entry on the right Cardiovascular system: S1 & S2 heard, RRR. No JVD, murmurs, rubs, gallops or clicks. Gastrointestinal system: Abdomen is nondistended, soft and nontender. No organomegaly or masses felt. Normal bowel sounds heard. Central nervous system: Alert and oriented. No focal neurological deficits. Extremities: 2-3+ edema, no clubbing ,no cyanosis, distal peripheral pulses palpable. Skin: No rashes,  lesions or ulcers,no icterus ,no pallor MSK: Normal muscle bulk,tone ,power Psychiatry: Judgement and insight appear normal. Mood & affect appropriate.     Data Reviewed: I have personally reviewed following labs and imaging studies  CBC: Recent Labs  Lab 06/24/17 2049  WBC 7.0  NEUTROABS 4.6  HGB 12.1  HCT 36.8  MCV 82.9  PLT 243   Basic  Metabolic Panel: Recent Labs  Lab 06/24/17 2049 06/25/17 0428  NA 138 140  K 3.8 3.6  CL 104 105  CO2 24 25  GLUCOSE 110* 93  BUN 19 19  CREATININE 0.58 0.54  CALCIUM 9.0 8.8*   GFR: Estimated Creatinine Clearance: 58.1 mL/min (by C-G formula based on SCr of 0.54 mg/dL). Liver Function Tests: Recent Labs  Lab 06/24/17 2049  AST 43*  ALT 15  ALKPHOS 181*  BILITOT 0.7  PROT 7.0  ALBUMIN 2.9*   No results for input(s): LIPASE, AMYLASE in the last 168 hours. No results for input(s): AMMONIA in the last 168 hours. Coagulation Profile: No results for input(s): INR, PROTIME in the last 168 hours. Cardiac Enzymes: Recent Labs  Lab 06/24/17 2049 06/25/17 0428 06/25/17 1039  TROPONINI 0.04* 0.05* 0.03*   BNP (last 3 results) No results for input(s): PROBNP in the last 8760 hours. HbA1C: No results for input(s): HGBA1C in the last 72 hours. CBG: No results for input(s): GLUCAP in the last 168 hours. Lipid Profile: No results for input(s): CHOL, HDL, LDLCALC, TRIG, CHOLHDL, LDLDIRECT in the last 72 hours. Thyroid Function Tests: No results for input(s): TSH, T4TOTAL, FREET4, T3FREE, THYROIDAB in the last 72 hours. Anemia Panel: No results for input(s): VITAMINB12, FOLATE, FERRITIN, TIBC, IRON, RETICCTPCT in the last 72 hours. Sepsis Labs: No results for input(s): PROCALCITON, LATICACIDVEN in the last 168 hours.  Recent Results (from the past 240 hour(s))  MRSA PCR Screening     Status: None   Collection Time: 06/25/17 12:41 AM  Result Value Ref Range Status   MRSA by PCR NEGATIVE NEGATIVE Final    Comment:        The GeneXpert MRSA Assay (FDA approved for NASAL specimens only), is one component of a comprehensive MRSA colonization surveillance program. It is not intended to diagnose MRSA infection nor to guide or monitor treatment for MRSA infections. Performed at [facility], [address] [place], [place] [address]           Radiology Studies: Dg Chest 2 View  Result Date: 06/24/2017 CLINICAL DATA:  Cough and dyspnea EXAM: CHEST - 2 VIEW COMPARISON:  06/24/2017 CXR, chest CT 05/09/2016 FINDINGS: Stable borderline cardiomegaly. Minimal aortic atherosclerosis without aneurysm. Obscuration of the right heart border and right hemidiaphragm likely on the basis of right middle lobe consolidation and bibasilar atelectasis. IMPRESSION: Cardiomegaly with central pulmonary vascular congestion. Right middle lobe consolidation and bibasilar atelectasis. No significant change from earlier exam. Findings could represent stigmata of an endobronchial obstruction from mucus or an endobronchial lesion potentially. Electronically Signed   By: Tollie Ethavid  Kwon M.D.   On: 06/24/2017 20:48   Ct Chest W Contrast  Result Date: 06/25/2017 CLINICAL DATA:  Cough and shortness of breath EXAM: CT CHEST WITH CONTRAST TECHNIQUE: Multidetector CT imaging of the chest was performed during intravenous contrast administration. CONTRAST:  Study 5 mL Isovue-300 nonionic COMPARISON:  Chest CT May 09, 2016; chest radiograph June 24, 2017 FINDINGS: Cardiovascular: There is no thoracic aortic aneurysm or dissection. There are scattered foci of atherosclerotic calcification in the proximal visualized  great vessels. There are foci of aortic atherosclerosis as well as foci of coronary artery calcification. No evident pulmonary embolus. There is no appreciable pleural effusion or pleural thickening. There is prominence of the main pulmonary outflow tract measuring 3.4 cm. This is a finding felt to be indicative of a degree of pulmonary arterial hypertension. Mediastinum/Nodes: Visualized thyroid appears unremarkable. There are multiple borderline enlarged axillary lymph nodes bilaterally. There is no appreciable lymph node enlargement in the hila or mediastinal regions. No esophageal lesions are evident. Lungs/Pleura: There is a moderate free-flowing pleural  effusion on the right. There is consolidation and atelectasis in the right lower lobe with the greatest degree of consolidation in the superior segment right lower lobe along the periphery. There is patchy atelectasis in the right middle lobe. The left lung is clear. Upper Abdomen: There is mild ascites in the visualized upper abdomen. The visualized liver as a somewhat nodular contour raising concern for a degree of cirrhosis. There is aortic atherosclerosis. Visualized upper abdominal structures otherwise appear unremarkable. Musculoskeletal: There is degenerative change in the thoracic spine. There is anterior wedging of several thoracic vertebral bodies, progressed since prior study. There is endplate sclerosis at several levels, not present on prior study. Etiology for this change uncertain. No destructive bone lesions are evident. IMPRESSION: 1. Moderate right pleural effusion with compressive atelectasis in the right base. Area of apparent pneumonia in the periphery of the superior segment right lower lobe. Atelectatic change noted in the right middle lobe. Left lung is clear. 2. Axillary adenopathy bilaterally of uncertain etiology. No appreciable hilar or mediastinal adenopathy. 3. There are now several compression fractures in the thoracic spine. There is in plate sclerosis at several levels of fracture. The etiology for this finding is uncertain. Question possibility of areas of discitis to account for this finding. In this regard, given the change from prior study, it may be reasonable to consider thoracic MR pre and post-contrast to assess for possible discitis. 4. Aortic atherosclerosis noted. Foci of great vessel and coronary artery calcification identified. 5.  Upper abdominal ascites. 6.  Suspect a degree of hepatic cirrhosis. Aortic Atherosclerosis (ICD10-I70.0). Electronically Signed   By: Bretta Bang III M.D.   On: 06/25/2017 10:31        Scheduled Meds: . albuterol  5 mg  Nebulization Once  . enoxaparin (LOVENOX) injection  40 mg Subcutaneous QHS  . folic acid  1 mg Oral Daily  . furosemide  40 mg Intravenous BID  . multivitamin with minerals  1 tablet Oral Daily  . sodium chloride flush  3 mL Intravenous Q12H  . thiamine  100 mg Oral Daily   Or  . thiamine  100 mg Intravenous Daily   Continuous Infusions: . sodium chloride    . azithromycin    . cefTRIAXone (ROCEPHIN)  IV       LOS: 0 days    Time spent: More than 50% of that time was spent in counseling and/or coordination of care.      Burnadette Pop, MD [facility] Pager [phone]  If 7PM-7AM, please contact night-coverage [email or web address] Password [facility] 06/25/2017, 12:47 PM

## 2017-06-25 NOTE — Procedures (Signed)
Ultrasound-guided diagnostic and therapeutic right thoracentesis performed yielding 580 cc of slightly hazy, yellow  fluid. No immediate complications. Follow-up chest x-ray pending.The fluid was sent to the lab for preordered studies.

## 2017-06-26 DIAGNOSIS — Z9889 Other specified postprocedural states: Secondary | ICD-10-CM

## 2017-06-26 DIAGNOSIS — J90 Pleural effusion, not elsewhere classified: Secondary | ICD-10-CM

## 2017-06-26 DIAGNOSIS — R918 Other nonspecific abnormal finding of lung field: Secondary | ICD-10-CM

## 2017-06-26 DIAGNOSIS — I272 Pulmonary hypertension, unspecified: Secondary | ICD-10-CM

## 2017-06-26 DIAGNOSIS — I509 Heart failure, unspecified: Secondary | ICD-10-CM

## 2017-06-26 LAB — GRAM STAIN

## 2017-06-26 LAB — BASIC METABOLIC PANEL
Anion gap: 10 (ref 5–15)
BUN: 14 mg/dL (ref 6–20)
CO2: 29 mmol/L (ref 22–32)
Calcium: 8.4 mg/dL — ABNORMAL LOW (ref 8.9–10.3)
Chloride: 103 mmol/L (ref 101–111)
Creatinine, Ser: 0.49 mg/dL (ref 0.44–1.00)
GFR calc Af Amer: >60 mL/min (ref >60)
Glucose, Bld: 118 mg/dL — ABNORMAL HIGH (ref 65–99)
POTASSIUM: 3.1 mmol/L — AB (ref 3.5–5.1)
SODIUM: 142 mmol/L (ref 135–145)

## 2017-06-26 LAB — PROTEIN, PLEURAL OR PERITONEAL FLUID: Total protein, fluid: <3 g/dL

## 2017-06-26 MED ORDER — FUROSEMIDE 40 MG PO TABS: 40.0000 mg | ORAL_TABLET | Freq: Every day | ORAL | 0 refills | Status: DC

## 2017-06-26 MED ORDER — CEFDINIR 300 MG PO CAPS: 300.0000 mg | ORAL_CAPSULE | Freq: Two times a day (BID) | ORAL | 0 refills | Status: AC

## 2017-06-26 MED ORDER — THIAMINE HCL 100 MG PO TABS: 100.0000 mg | ORAL_TABLET | Freq: Every day | ORAL | 0 refills | Status: AC

## 2017-06-26 MED ORDER — POTASSIUM CHLORIDE CRYS ER 20 MEQ PO TBCR
40.0000 meq | EXTENDED_RELEASE_TABLET | ORAL | Status: DC
  Administered 2017-06-26: 40 meq via ORAL
  Filled 2017-06-26: qty 2

## 2017-06-26 MED ORDER — FOLIC ACID 1 MG PO TABS: 1.0000 mg | ORAL_TABLET | Freq: Every day | ORAL | 0 refills | Status: AC

## 2017-06-26 MED ORDER — AZITHROMYCIN 500 MG PO TABS: 500.0000 mg | ORAL_TABLET | Freq: Every day | ORAL | 0 refills | Status: AC

## 2017-06-26 MED ORDER — FUROSEMIDE 40 MG PO TABS
40.0000 mg | ORAL_TABLET | Freq: Every day | ORAL | Status: DC
  Administered 2017-06-26: 40 mg via ORAL
  Filled 2017-06-26: qty 1

## 2017-06-26 MED ORDER — POTASSIUM CHLORIDE ER 20 MEQ PO TBCR: 20.0000 meq | EXTENDED_RELEASE_TABLET | Freq: Every day | ORAL | 0 refills | Status: DC

## 2017-06-26 MED FILL — FOLIC ACID 1 MG TABLET: 1 | 30 days supply | Qty: 30 | Fill #0

## 2017-06-26 MED FILL — FUROSEMIDE 40 MG TAB: 40 | 30 days supply | Qty: 30 | Fill #0

## 2017-06-26 MED FILL — POTASSIUM CL ER 20 MEQ TABL: 20 | 14 days supply | Qty: 14 | Fill #0

## 2017-06-26 MED FILL — AZITHROMYCIN 500 MG TABLET: 500 | 2 days supply | Qty: 2 | Fill #0

## 2017-06-26 MED FILL — CEFDINIR 300 MG CAPS: 300 | 6 days supply | Qty: 12 | Fill #0

## 2017-06-26 NOTE — Progress Notes (Signed)
CSW contacted by [facility] and informed that patient has arrived to their facility and they have received no paperwork. CSW not aware that patient was from an ALF. Per chart review, patient's nursing admission summary listed patient's type of residence as private residence. Staff confirmed that patient lives at ALF. [facility] staff member Devonna requested clinicals for patient. CS[facility]. CSW signing off, no other needs identified at this time.   Celso SickleKimberly Michelene [place], [place] Clinical Social Worker [facility] Cell#: [phone]

## 2017-06-26 NOTE — [place] FL2 (Signed)
[place] MEDICAID FL2 LEVEL OF CARE SCREENING TOOL     IDENTIFICATION  Patient Name: Rachel Williams Birthdate: Oct 13, 1950 Sex: female Admission Date (Current Location): 06/24/2017  [facility] [place]   Facility and Address:  [facility],  [address], [place] [id number]      Provider Number: [id number]  Attending Physician Name and Address:  No att. providers found  Relative Name and Phone Number:       Current Level of Care: Hospital Recommended Level of Care: Assisted Living Facility Prior Approval Number:    Date Approved/Denied:   PASRR Number:    Discharge Plan: Other (Comment)(Assisted Living Facility)    Current Diagnoses: Patient Active Problem List   Diagnosis Date Noted  . Pulmonary hypertension (HCC) 06/26/2017  . CHF (congestive heart failure) (HCC) 06/25/2017  . Acute CHF (congestive heart failure) (HCC) 06/24/2017  . Right middle lobe pulmonary infiltrate 06/24/2017  . Sepsis, unspecified organism (HCC) 05/21/2016  . Elevated LFTs 05/21/2016  . Macrocytic anemia 05/21/2016  . Influenza A 05/21/2016  . Malnutrition of moderate degree 05/29/2015  . NSVT (nonsustained ventricular tachycardia) (HCC) 05/29/2015  . Hyponatremia 05/27/2015  . Recurrent falls 05/27/2015  . Facial bruising 05/27/2015  . Hypomagnesemia 05/27/2015  . Hypokalemia 05/27/2015  . Alcohol use (HCC), daily 2-3 drinks of vodka 05/27/2015  . Smoker 05/27/2015  . Dehydration 05/27/2015  . Vertebral compression fracture (HCC) 05/27/2015  . Acute pharyngitis 05/27/2015  . Chronic constipation 05/27/2015  . Generalized weakness     Orientation RESPIRATION BLADDER Height & Weight     Self, Time, Situation, Place  Normal Continent Weight: 131 lb 1.6 oz (59.5 kg) Height:  5\' 2"  (157.5 cm)  BEHAVIORAL SYMPTOMS/MOOD NEUROLOGICAL BOWEL NUTRITION STATUS      Continent Diet(regular)  AMBULATORY STATUS COMMUNICATION OF NEEDS Skin   Independent Verbally  Normal                       Personal Care Assistance Level of Assistance  Bathing, Feeding, Dressing Bathing Assistance: Independent Feeding assistance: Independent Dressing Assistance: Independent     Functional Limitations Info  Sight, Hearing, Speech Sight Info: Adequate Hearing Info: Adequate Speech Info: Adequate    SPECIAL CARE FACTORS FREQUENCY                       Contractures Contractures Info: Not present    Additional Factors Info  Code Status, Allergies Code Status Info: Full code Allergies Info: Ciprofloxacin;Levofloxacin;Nickel;Penicillins              Discharge Medications:  Medication List    STOP taking these medications   ibuprofen 200 MG tablet Commonly known as:  ADVIL,MOTRIN   Magnesium Oxide 200 MG Tabs   multivitamin with minerals Tabs tablet     TAKE these medications   azithromycin 500 MG tablet Commonly known as:  ZITHROMAX Take 1 tablet (500 mg total) by mouth daily for 2 days. Take 1 tablet daily for 2 days.   cefdinir 300 MG capsule Commonly known as:  OMNICEF Take 1 capsule (300 mg total) by mouth 2 (two) times daily for 6 days.   folic acid 1 MG tablet Commonly known as:  FOLVITE Take 1 tablet (1 mg total) by mouth daily.   furosemide 40 MG tablet Commonly known as:  LASIX Take 1 tablet (40 mg total) by mouth daily. Start taking on:  06/27/2017   Potassium Chloride ER 20 MEQ  Tbcr Take 20 mEq by mouth daily.   thiamine 100 MG tablet Take 1 tablet (100 mg total) by mouth daily.       Relevant Imaging Results:  Relevant Lab Results:   Additional Information    Antionette Poles, LCSW

## 2017-06-26 NOTE — Progress Notes (Signed)
I agree with student nurse Navreet's assessment.

## 2017-06-26 NOTE — Discharge Summary (Addendum)
Physician Discharge Summary  Rachel Williams ZOX:[id number] DOB: 10/17/1950 DOA: 06/24/2017  PCP: Sigmund Hazel, MD  Admit date: 06/24/2017 Discharge date: 06/26/2017  Admitted From: ALF Disposition: ALF  Home Health:No  Discharge Condition:Stable CODE STATUS:Full Diet recommendation: Heart Healthy  Brief/Interim Summary:  Patient is a 67 year old female with past medical history of alcohol abuse, prior pneumonia, smoking who presents to the emergency department with complaints of shortness of breath and nonproductive cough for past 1 day ,she also complained of bilateral leg swelling for last 1 week.  Patient was seen by her PCP and was sent to the emergency department for further evaluation.  Chest Xray showed  pulmonary vascular congestion and persistent right middle lobe consolidation compared to a year ago. Patient was admitted for possible CHF exacerbation. Patient was started on IV diuresis.  Her bilateral lower extremity edema significantly improved today.  CT chest showed: moderate right pleural effusion with compressive atelectasis in the right base. Area of apparent pneumonia in the periphery of the superior segment right lower lobe. Atelectatic change noted in the right middle lobe.   Patient underwent right-sided thoracocentesis with removal of 580 cc of transudative fluid.  Patient's respiratory status improved significantly after that.  Patient was also started on antibiotics for possible pneumonia as shown in the CT scan.  CT chest was also suspicious for multiple compression fractures of the thoracic spine.  She underwent MRI of the thoracic spine which showed multiple nonacute mild compression deformities of the thoracic vertebrae, including the T6, T7, T10 and T11 levels. Echocardiogram has showed possible diastolic heart failure, moderate to severe tricuspid regurgitation and severe pulmonary hypertension. Patient's IV Lasix changed to oral today.  Since her blood pressure was on  the lower side we could restart her home ACE inhibitors/ARB or beta blockers. This can be done as an outpatient. Patient's overall status is stable this morning.  She will be discharged home. Patient needs to follow-up with cardiology and pulmonology as an outpatient for the evaluation of diastolic heart failure ,moderate to severe tricuspid regurgitation and severe pulmonary hypertension.  Following problems were addressed during her hospitalization:  Acute diastolic CHF: Patient has severe bilateral peripheral edema.  Increased BNP on presentation.  No history of CHF in the past.  Echocardiogram report as above. She was on Lasix 40 mg IV twice daily.  Lasix changed to 40 mg oral daily. ACE inhibitor/ARB and beta-blockers can be started on an outpatient. Recommended to  restrict fluid to less than 1.5 L a day and restrict salt intake less than 2 g a day.  Follow-up with cardiology as an outpatient.  Pulmonary hypertension: Echocardiogram showed severe pulmonary hypertension.  Follow-up with pulmonology as an outpatient.  No history of sleep apnea.  Pneumonia: CT chest showed moderate right pleural effusion with atelectasis of right middle lobe.  Also showed pneumonia on the right side started on ceftriaxone and azithromycin.  Continue oral antibiotics at home  Alcohol abuse: Counseled for cessation .  Continue thiamine and folic acid  Thoracic compression deformities: Continue upportive care  Smoker: Patient has been  actively smoking.  She has a 50-pack-year smoking history. Counseled for smoking cessation.     Discharge Diagnoses:  Principal Problem:   Acute CHF (congestive heart failure) (HCC) Active Problems:   Alcohol use (HCC), daily 2-3 drinks of vodka   Right middle lobe pulmonary infiltrate   CHF (congestive heart failure) (HCC)   Pulmonary hypertension [facility])    Discharge Instructions  Discharge Instructions  Diet - low sodium heart healthy   Complete by:  As  directed    Discharge instructions   Complete by:  As directed    1) Follow up with your PMD in a week. 2) Do CBC and BMP test in a  Week. 3) Stop drinking alcohol.Stop smoking 4)Follow up with cardiology and pulmonology through referral of your PMD for the evaluation of diastolic heart failure and pulmonary Hypertension. 5)Restrict salt intake to less than 2 grams a days and fluid intake to less than 1.5 Litres a day 6) Take prescribed medications as instructed.   Increase activity slowly   Complete by:  As directed      Allergies as of 06/26/2017      Reactions   Ciprofloxacin Rash   Levofloxacin Rash   Rash on back after 5 days therapy.    Nickel Rash   Anything with nickel in it.    Penicillins Rash   Has patient had a PCN reaction causing immediate rash, facial/tongue/throat swelling, SOB or lightheadedness with hypotension: No Has patient had a PCN reaction causing severe rash involving mucus membranes or skin necrosis: No Ha patient had a PCN reaction that required hospitalization No Has patient had a PCN reaction occurring within the last 10 years: No If all of the above answers are "NO", then may proceed with Cephalosporin use. Had simple rash around age 67, needed no further care        Follow-up Information    Sigmund HazelMiller, Lisa, MD. Schedule an appointment as soon as possible for a visit in 1 week(s).   Specialty:  Family Medicine Contact information: [address] [place] [address] [phone]          Allergies  Allergen Reactions  . Ciprofloxacin Rash  . Levofloxacin Rash    Rash on back after 5 days therapy.   . Nickel Rash    Anything with nickel in it.   Marland Kitchen. Penicillins Rash    Has patient had a PCN reaction causing immediate rash, facial/tongue/throat swelling, SOB or lightheadedness with hypotension: No Has patient had a PCN reaction causing severe rash involving mucus membranes or skin necrosis: No Ha patient had a PCN reaction that  required hospitalization No Has patient had a PCN reaction occurring within the last 10 years: No If all of the above answers are "NO", then may proceed with Cephalosporin use. Had simple rash around age 67, needed no further care      Consultations: None  Procedures/Studies: Dg Chest 1 View  Result Date: 06/25/2017 CLINICAL DATA:  Status post right thoracentesis EXAM: CHEST  1 VIEW COMPARISON:  06/24/2017 FINDINGS: Right-sided thoracentesis has been performed. No significant residual pleural effusion is seen. No pneumothorax is noted. Mild right basilar atelectasis is seen. The left lung remains clear. No acute bony abnormality is noted. IMPRESSION: No pneumothorax following right thoracentesis. Electronically Signed   By: Alcide CleverMark  Lukens M.D.   On: 06/25/2017 14:49   Dg Chest 2 View  Result Date: 06/24/2017 CLINICAL DATA:  Cough and dyspnea EXAM: CHEST - 2 VIEW COMPARISON:  06/24/2017 CXR, chest CT 05/09/2016 FINDINGS: Stable borderline cardiomegaly. Minimal aortic atherosclerosis without aneurysm. Obscuration of the right heart border and right hemidiaphragm likely on the basis of right middle lobe consolidation and bibasilar atelectasis. IMPRESSION: Cardiomegaly with central pulmonary vascular congestion. Right middle lobe consolidation and bibasilar atelectasis. No significant change from earlier exam. Findings could represent stigmata of an endobronchial obstruction from mucus or an endobronchial lesion potentially. Electronically  Signed   By: Tollie Eth M.D.   On: 06/24/2017 20:48   Ct Chest W Contrast  Result Date: 06/25/2017 CLINICAL DATA:  Cough and shortness of breath EXAM: CT CHEST WITH CONTRAST TECHNIQUE: Multidetector CT imaging of the chest was performed during intravenous contrast administration. CONTRAST:  Study 5 mL Isovue-300 nonionic COMPARISON:  Chest CT May 09, 2016; chest radiograph June 24, 2017 FINDINGS: Cardiovascular: There is no thoracic aortic aneurysm or  dissection. There are scattered foci of atherosclerotic calcification in the proximal visualized great vessels. There are foci of aortic atherosclerosis as well as foci of coronary artery calcification. No evident pulmonary embolus. There is no appreciable pleural effusion or pleural thickening. There is prominence of the main pulmonary outflow tract measuring 3.4 cm. This is a finding felt to be indicative of a degree of pulmonary arterial hypertension. Mediastinum/Nodes: Visualized thyroid appears unremarkable. There are multiple borderline enlarged axillary lymph nodes bilaterally. There is no appreciable lymph node enlargement in the hila or mediastinal regions. No esophageal lesions are evident. Lungs/Pleura: There is a moderate free-flowing pleural effusion on the right. There is consolidation and atelectasis in the right lower lobe with the greatest degree of consolidation in the superior segment right lower lobe along the periphery. There is patchy atelectasis in the right middle lobe. The left lung is clear. Upper Abdomen: There is mild ascites in the visualized upper abdomen. The visualized liver as a somewhat nodular contour raising concern for a degree of cirrhosis. There is aortic atherosclerosis. Visualized upper abdominal structures otherwise appear unremarkable. Musculoskeletal: There is degenerative change in the thoracic spine. There is anterior wedging of several thoracic vertebral bodies, progressed since prior study. There is endplate sclerosis at several levels, not present on prior study. Etiology for this change uncertain. No destructive bone lesions are evident. IMPRESSION: 1. Moderate right pleural effusion with compressive atelectasis in the right base. Area of apparent pneumonia in the periphery of the superior segment right lower lobe. Atelectatic change noted in the right middle lobe. Left lung is clear. 2. Axillary adenopathy bilaterally of uncertain etiology. No appreciable hilar or  mediastinal adenopathy. 3. There are now several compression fractures in the thoracic spine. There is in plate sclerosis at several levels of fracture. The etiology for this finding is uncertain. Question possibility of areas of discitis to account for this finding. In this regard, given the change from prior study, it may be reasonable to consider thoracic MR pre and post-contrast to assess for possible discitis. 4. Aortic atherosclerosis noted. Foci of great vessel and coronary artery calcification identified. 5.  Upper abdominal ascites. 6.  Suspect a degree of hepatic cirrhosis. Aortic Atherosclerosis (ICD10-I70.0). Electronically Signed   By: Bretta Bang III M.D.   On: 06/25/2017 10:31   Mr Thoracic Spine W Wo Contrast  Result Date: 06/25/2017 CLINICAL DATA:  Severe mid back pain.  Possible discitis. EXAM: MRI THORACIC WITHOUT AND WITH CONTRAST TECHNIQUE: Multiplanar and multiecho pulse sequences of the thoracic spine were obtained without and with intravenous contrast. CONTRAST:  13mL MULTIHANCE GADOBENATE DIMEGLUMINE 529 MG/ML IV SOLN COMPARISON:  Chest CT 06/25/2017 FINDINGS: MRI THORACIC SPINE FINDINGS Alignment:  Physiologic. Vertebrae: There are multiple nonacute compression deformities of the thoracic spine including the T6, T7, T10 and T11 levels. There is a T11 hemangioma. No discitis-osteomyelitis. Cord:  Normal signal and morphology. Paraspinal and other soft tissues: Right pleural effusion and other pulmonary findings are better assessed on the concomitant chest CT. Disc levels: No spinal canal stenosis.  No significant disc herniation. IMPRESSION: 1. No discitis-osteomyelitis. 2. Multiple nonacute mild compression deformities of the thoracic vertebrae, including the T6, T7, T10 and T11 levels. 3. No spinal canal stenosis. 4. Right pleural effusion and other pulmonary abnormalities, better characterized on concomitant chest CT. Electronically Signed   By: Deatra Robinson M.D.   On:  06/25/2017 18:13   US Thoracentesis Asp Pleural Space W/img Guide  Result Date: 06/25/2017 INDICATION: Patient with history of tobacco and alcohol abuse, pneumonia, CHF, cirrhosis by imaging, dyspnea, right pleural effusion. Request made for diagnostic and therapeutic right thoracentesis. EXAM: ULTRASOUND GUIDED DIAGNOSTIC AND THERAPEUTIC RIGHT THORACENTESIS MEDICATIONS: None COMPLICATIONS: None immediate. PROCEDURE: An ultrasound guided thoracentesis was thoroughly discussed with the patient and questions answered. The benefits, risks, alternatives and complications were also discussed. The patient understands and wishes to proceed with the procedure. Written consent was obtained. Ultrasound was performed to localize and mark an adequate pocket of fluid in the right chest. The area was then prepped and draped in the normal sterile fashion. 1% Lidocaine was used for local anesthesia. Under ultrasound guidance a 6 Fr Safe-T-Centesis catheter was introduced. Thoracentesis was performed. The catheter was removed and a dressing applied. FINDINGS: A total of approximately 580 cc of slightly hazy, yellow fluid was removed. Samples were sent to the laboratory as requested by the clinical team. IMPRESSION: Successful ultrasound guided diagnostic and therapeutic right thoracentesis yielding 580 cc of pleural fluid. Follow-up chest x-ray revealed no pneumothorax. Read by: Jeananne Rama, PA-C Electronically Signed   By: Corlis Leak M.D.   On: 06/25/2017 14:53   Echo    Subjective: Patient seen and examined the bedside this morning.  Remains comfortable.  Respiratory status is stable.  Bilateral lower extremity edema has significantly improved today.  Stable for discharge to home today  Discharge Exam: Vitals:   06/26/17 0800 06/26/17 1101  BP: (!) 95/50 118/60  Pulse: 96   Resp: 20   Temp: 98.3 F (36.8 C)   SpO2: 93%    Vitals:   06/25/17 2308 06/26/17 0556 06/26/17 0800 06/26/17 1101  BP: 129/70 130/65  (!) 95/50 118/60  Pulse: (!) 107 100 96   Resp: 20 20 20    Temp: 98.6 F (37 C) 98.1 F (36.7 C) 98.3 F (36.8 C)   TempSrc: Oral Oral Oral   SpO2: 92% 92% 93%   Weight:  59.5 kg (131 lb 1.6 oz)    Height:        General: Pt is alert, awake, not in acute distress Cardiovascular: RRR, S1/S2 +, no rubs, no gallops Respiratory: CTA bilaterally, no wheezing, no rhonchi Abdominal: Soft, NT, ND, bowel sounds + Extremities: Trace  Edema on bilateral lower extremities, no cyanosis    The results of significant diagnostics from this hospitalization (including imaging, microbiology, ancillary and laboratory) are listed below for reference.     Microbiology: Recent Results (from the past 240 hour(s))  MRSA PCR Screening     Status: None   Collection Time: 06/25/17 12:41 AM  Result Value Ref Range Status   MRSA by PCR NEGATIVE NEGATIVE Final    Comment:        The GeneXpert MRSA Assay (FDA approved for NASAL specimens only), is one component of a comprehensive MRSA colonization surveillance program. It is not intended to diagnose MRSA infection nor to guide or monitor treatment for MRSA infections. Performed at [facility], [address] [place], [place] [address]   Gram stain  Status: None   Collection Time: 06/25/17  2:30 PM  Result Value Ref Range Status   Specimen Description FLUID PLEURAL  Final   Special Requests NONE  Final   Gram Stain   Final    RARE WBC PRESENT,BOTH PMN AND MONONUCLEAR NO ORGANISMS SEEN Performed at [facility], [address], [place], [place] [address]    Report Status 06/26/2017 FINAL  Final    Allergies as of 06/26/2017      Reactions   Ciprofloxacin Rash   Levofloxacin Rash   Rash on back after 5 days therapy.    Nickel Rash   Anything with nickel in it.    Penicillins Rash   Has patient had a PCN reaction causing immediate rash, facial/tongue/throat swelling, SOB or lightheadedness with hypotension:  No Has patient had a PCN reaction causing severe rash involving mucus membranes or skin necrosis: No Ha patient had a PCN reaction that required hospitalization No Has patient had a PCN reaction occurring within the last 10 years: No If all of the above answers are "NO", then may proceed with Cephalosporin use. Had simple rash around age 66, needed no further care      Medication List    STOP taking these medications   ibuprofen 200 MG tablet Commonly known as:  ADVIL,MOTRIN   Magnesium Oxide 200 MG Tabs   multivitamin with minerals Tabs tablet     TAKE these medications   azithromycin 500 MG tablet Commonly known as:  ZITHROMAX Take 1 tablet (500 mg total) by mouth daily for 2 days. Take 1 tablet daily for 2 days.   cefdinir 300 MG capsule Commonly known as:  OMNICEF Take 1 capsule (300 mg total) by mouth 2 (two) times daily for 6 days.   folic acid 1 MG tablet Commonly known as:  FOLVITE Take 1 tablet (1 mg total) by mouth daily.   furosemide 40 MG tablet Commonly known as:  LASIX Take 1 tablet (40 mg total) by mouth daily. Start taking on:  06/27/2017   Potassium Chloride ER 20 MEQ Tbcr Take 20 mEq by mouth daily.   thiamine 100 MG tablet Take 1 tablet (100 mg total) by mouth daily.      Labs: BNP (last 3 results) Recent Labs    06/24/17 2049  BNP 1,266.6*   Basic Metabolic Panel: Recent Labs  Lab 06/24/17 2049 06/25/17 0428 06/26/17 0500  NA 138 140 142  K 3.8 3.6 3.1*  CL 104 105 103  CO2 24 25 29   GLUCOSE 110* 93 118*  BUN 19 19 14   CREATININE 0.58 0.54 0.49  CALCIUM 9.0 8.8* 8.4*   Liver Function Tests: Recent Labs  Lab 06/24/17 2049  AST 43*  ALT 15  ALKPHOS 181*  BILITOT 0.7  PROT 7.0  ALBUMIN 2.9*   No results for input(s): LIPASE, AMYLASE in the last 168 hours. No results for input(s): AMMONIA in the last 168 hours. CBC: Recent Labs  Lab 06/24/17 2049  WBC 7.0  NEUTROABS 4.6  HGB 12.1  HCT 36.8  MCV 82.9  PLT 243    Cardiac Enzymes: Recent Labs  Lab 06/24/17 2049 06/25/17 0428 06/25/17 1039  TROPONINI 0.04* 0.05* 0.03*   BNP: Invalid input(s): POCBNP CBG: No results for input(s): GLUCAP in the last 168 hours. D-Dimer No results for input(s): DDIMER in the last 72 hours. Hgb A1c No results for input(s): HGBA1C in the last 72 hours. Lipid Profile No results for input(s): CHOL, HDL, LDLCALC,  TRIG, CHOLHDL, LDLDIRECT in the last 72 hours. Thyroid function studies No results for input(s): TSH, T4TOTAL, T3FREE, THYROIDAB in the last 72 hours.  Invalid input(s): FREET3 Anemia work up No results for input(s): VITAMINB12, FOLATE, FERRITIN, TIBC, IRON, RETICCTPCT in the last 72 hours. Urinalysis    Component Value Date/Time   COLORURINE AMBER (A) 05/22/2016 1025   APPEARANCEUR CLOUDY (A) 05/22/2016 1025   LABSPEC 1.020 05/22/2016 1025   PHURINE 5.0 05/22/2016 1025   GLUCOSEU NEGATIVE 05/22/2016 1025   HGBUR MODERATE (A) 05/22/2016 1025   BILIRUBINUR SMALL (A) 05/22/2016 1025   KETONESUR NEGATIVE 05/22/2016 1025   PROTEINUR NEGATIVE 05/22/2016 1025   UROBILINOGEN 1.0 01/11/2010 1611   NITRITE NEGATIVE 05/22/2016 1025   LEUKOCYTESUR MODERATE (A) 05/22/2016 1025   Sepsis Labs Invalid input(s): PROCALCITONIN,  WBC,  LACTICIDVEN Microbiology Recent Results (from the past 240 hour(s))  MRSA PCR Screening     Status: None   Collection Time: 06/25/17 12:41 AM  Result Value Ref Range Status   MRSA by PCR NEGATIVE NEGATIVE Final    Comment:        The GeneXpert MRSA Assay (FDA approved for NASAL specimens only), is one component of a comprehensive MRSA colonization surveillance program. It is not intended to diagnose MRSA infection nor to guide or monitor treatment for MRSA infections. Performed at [facility], [address], [place], [place] [address]   Gram stain     Status: None   Collection Time: 06/25/17  2:30 PM  Result Value Ref Range Status   Specimen  Description FLUID PLEURAL  Final   Special Requests NONE  Final   Gram Stain   Final    RARE WBC PRESENT,BOTH PMN AND MONONUCLEAR NO ORGANISMS SEEN Performed at [facility], [address] [place], [place] [address]    Report Status 06/26/2017 FINAL  Final     Time coordinating discharge: Over 30 minutes  SIGNED:   Burnadette Pop, MD  [facility] 06/26/2017, 2:53 PM Pager [phone]  If 7PM-7AM, please contact night-coverage [email or web address] Password [email or web address]

## 2017-06-26 NOTE — Evaluation (Signed)
Physical Therapy One Time Evaluation Patient Details Name: Rachel Williams MRN: [id number] DOB: 28-Jan-1951 Today's Date: 06/26/2017   History of Present Illness  67 year old female with past medical history of alcohol abuse, prior pneumonia, smoking and admitted for probable acute CHF exacerbation and pneumonia  Clinical Impression  Patient evaluated by Physical Therapy with no further acute PT needs identified. All education has been completed and the patient has no further questions.  Pt up in bathroom on arrival.  Pt ambulated around unit without difficulty using her rollator from home.  Daughter present and states pt spends half her time at home and then half at independent living facility. See below for any follow-up Physical Therapy or equipment needs. PT is signing off. Thank you for this referral.     Follow Up Recommendations No PT follow up    Equipment Recommendations  None recommended by PT    Recommendations for Other Services       Precautions / Restrictions Precautions Precautions: None      Mobility  Bed Mobility Overal bed mobility: Modified Independent                Transfers Overall transfer level: Modified independent                  Ambulation/Gait Ambulation/Gait assistance: Supervision;Modified independent (Device/Increase time) Ambulation Distance (Feet): 300 Feet Assistive device: 4-wheeled walker Gait Pattern/deviations: WFL(Within Functional Limits)     General Gait Details: no deficits observed with ambulation, HR 113 bpm  Stairs            Wheelchair Mobility    Modified Rankin (Stroke Patients Only)       Balance Overall balance assessment: (uses rollator, no previous falls per pt and daughter)                                           Pertinent Vitals/Pain Pain Assessment: No/denies pain    Home Living Family/patient expects to be discharged to:: Private residence Living Arrangements:  Alone Available Help at Discharge: Family;Available PRN/intermittently Type of Home: House Home Access: Ramped entrance     Home Layout: One level Home Equipment: Walker - 4 wheels;Bedside commode;Transport chair      Prior Function Level of Independence: Independent with assistive device(s)         Comments: uses rollator     Hand Dominance        Extremity/Trunk Assessment        Lower Extremity Assessment Lower Extremity Assessment: Overall WFL for tasks assessed       Communication   Communication: No difficulties  Cognition Arousal/Alertness: Awake/alert Behavior During Therapy: WFL for tasks assessed/performed Overall Cognitive Status: Within Functional Limits for tasks assessed                                        General Comments      Exercises     Assessment/Plan    PT Assessment Patent does not need any further PT services  PT Problem List         PT Treatment Interventions      PT Goals (Current goals can be found in the Care Plan section)  Acute Rehab PT Goals PT Goal Formulation: All assessment and education complete, DC therapy  Frequency     Barriers to discharge        Co-evaluation               AM-PAC PT "6 Clicks" Daily Activity  Outcome Measure Difficulty turning over in bed (including adjusting bedclothes, sheets and blankets)?: None Difficulty moving from lying on back to sitting on the side of the bed? : None Difficulty sitting down on and standing up from a chair with arms (e.g., wheelchair, bedside commode, etc,.)?: None Help needed moving to and from a bed to chair (including a wheelchair)?: None Help needed walking in hospital room?: None Help needed climbing 3-5 steps with a railing? : None 6 Click Score: 24    End of Session   Activity Tolerance: Patient tolerated treatment well Patient left: in chair;with call bell/phone within reach;with family/visitor present Nurse  Communication: Mobility status PT Visit Diagnosis: Difficulty in walking, not elsewhere classified (R26.2)    Time: [id number] PT Time Calculation (min) (ACUTE ONLY): 11 min   Charges:   PT Evaluation $PT Eval Low Complexity: 1 Low     PT G CodesZenovia Jarred:       Kati Carmine Youngberg, PT, DPT 06/26/2017 Pager: [phone]  Maida SaleLEMYRE,KATHrine E 06/26/2017, 11:44 AM

## 2017-07-01 LAB — CULTURE, BODY FLUID W GRAM STAIN -BOTTLE: Culture: NO GROWTH

## 2017-07-01 LAB — CULTURE, BODY FLUID-BOTTLE

## 2017-07-04 MED FILL — traMADol HCL 50 MG TABS: 50 | 30 days supply | Qty: 30 | Fill #0

## 2017-07-17 ENCOUNTER — Other Ambulatory Visit: Payer: Self-pay | Admitting: Cardiology

## 2017-07-17 DIAGNOSIS — I071 Rheumatic tricuspid insufficiency: Secondary | ICD-10-CM

## 2017-07-17 DIAGNOSIS — I270 Primary pulmonary hypertension: Secondary | ICD-10-CM

## 2017-07-17 NOTE — Progress Notes (Signed)
Rachel Williams  Date of visit:  07/17/2017 DOB:  Sep 22, 1950    Age:  67 yrs. Medical record number:  [id number]     Account number:  [id number] Primary Care Provider: Iva Boop ____________________________ CURRENT DIAGNOSES  1. Pulmonary Hypertension, Unspecified  2. Nonrheumatic Tricuspid (valve) Insufficiency  3. Edema  4. Atherosclerosis of aorta ____________________________ ALLERGIES  Cipro, Rash  Levaquin, Rash  Nickel, Rash  Penicillins, Rash ____________________________ MEDICATIONS  1. folic acid 1 mg tablet, 1 p.o. daily  2. furosemide 40 mg tablet, 1 p.o. daily  3. ibuprofen 200 mg tablet, PRN  4. potassium chloride ER 20 mEq tablet,extended release, 1 p.o. daily  5. tramadol 50 mg tablet, PRN ____________________________ HISTORY OF PRESENT ILLNESS This 67 year old female is seen at the request of Dr. Baxter Kail for evaluation of pulmonary hypertension and tricuspid regurgitation. The patient has a long history of tobacco abuse and has a greater than 50-pack-year history of smoking and continues to smoke. There is a history of partial colectomy for ruptured diverticulitis a number of years ago followed by reversal of the colostomy. In the chart there is mention of alcoholism and alcohol abuse. She was hospitalized about a year ago and has lived in assisted living since then. She continues to smoke less than a pack of cigarettes per day and drinks a couple of drinks per day. Around 3 years ago her husband died and she had a very difficult time. She had a very unsteady gait since then but has never seen a neurologist. She has to walk with a walker and is quite sedentary. She recently was noted to have worsening dyspnea as well as worsening pedal edema and was hospitalized in March. An echocardiogram at that time showed preserved LV systolic function but showed evidence of severe pulmonary hypertension with an estimated pulmonary pressure of 76 mm. She had reduced RV function as well as septal  bowing. She was diuresed and sent back to her primary care doctor but did not have any cardiology or pulmonary consultation while she was hospitalized. She was thus referred for cardiology today. She doesn't have any anginal type chest pain. She did have an echocardiogram done about a year ago that showed moderate pulmonary hypertension with a peak pulmonary pressure of around 54. She had a thoracentesis done during her most recent hospitalization with transudative fluid. She has never had a myocardial infarction. Her breathing is much better since she was hospitalized and she has been treated for pneumonia. ____________________________ PAST HISTORY  Past Medical Illnesses:  hypertension, anemia, diverticulitis, edema peripheral;  Cardiovascular Illnesses:  pulmonary hypertension(Primary), tricuspid insufficiency;  Infectious Diseases:  no previous history of significant infectious diseases;  Surgical Procedures:  colon repair with reversible colostomy;  Trauma History:  no previous history of significant trauma;  NYHA Classification:  I;  Cardiology Procedures-Invasive:  no previous interventional or invasive cardiology procedures;  Cardiology Procedures-Noninvasive:  echocardiogram;  Peripheral Vascular Procedures:  no previous invasive peripheral vascular procedures.;  LVEF not documented,   ____________________________ CARDIO-PULMONARY TEST DATES EKG Date:  07/17/2017;  CT Scan Date:  06/25/2017   ____________________________ FAMILY HISTORY Brother -- Armed forces training and education officer and well Brother -- Alive and well Father -- Father dead, Heart disease Mother -- Mother dead, Influenza/Pneumonia Sister -- Sister dead, Alcoholism Sister -- Armed forces training and education officer and well ____________________________ SOCIAL HISTORY Alcohol Use:  history of alcohol abuse, currently drinking, wine and 2 glasses per day;  Smoking:  smokes cigarettes, less than 1 ppd, 50 pack year history;  Diet:  carbohydrate modified diet;  Lifestyle:  widowed;  Exercise:   exercise is limited due to physical disability;  Occupation:  retired;  Residence:  assisted living resident;  Job Description:  Environmental health practitioner at [employer];   ____________________________ REVIEW OF SYSTEMS General:  no change in weight  Integumentary:no rashes or new skin lesions. Eyes: wears eye glasses/contact lenses, cataracts Ears, Nose, Throat, Mouth:  denies any hearing loss, epistaxis, hoarseness or difficulty speaking. Respiratory: denies dyspnea, cough, wheezing or hemoptysis. Cardiovascular:  please review HPI Abdominal: denies dyspepsia, GI bleeding, constipation, or diarrheaGenitourinary-Female: stress incontinence Musculoskeletal:  denies arthritis, venous insufficiency, or muscle weakness Neurological:  gait disturbance Psychiatric:  denies depression or anxiety Hematological/Immunologic:  denies any food allergies, bleeding disorders. ____________________________ PHYSICAL EXAMINATION VITAL SIGNS  Blood Pressure:  140/70 Sitting, Right arm, regular cuff  , 140/74 Standing, Right arm and regular cuff   Pulse:  118/min. Weight:  119.00 lbs. Height:  62"BMI: 22  Constitutional:  pleasant white female, in no acute distress, appears older than stated age walks with walker Skin:  warm and dry to touch, no apparent skin lesions, or masses noted. Head:  normocephalic, normal hair pattern, no masses or tenderness Eyes:  EOMS Intact, PERRLA, C and S clear, Funduscopic exam not done. ENT:  ears, nose and throat reveal no gross abnormalities.  Dentition good. Neck:  v waves in upright position with JVD to jaw, no bruits Chest:  clear to auscultation, increased A-P diameter Cardiac:  rapid rhythm with 2/6 systolic murmur heard widely over precordium, no S3, normal s1 and s2 Abdomen:  abdomen soft,non-tender, no masses, no hepatospenomegaly, or aneurysm noted Peripheral Pulses:  the femoral,dorsalis pedis, and posterior tibial pulses are full and equal bilaterally with no bruits  auscultated. Extremities & Back:  1+ edema, bilateral venous insufficiency changes present Neurological:  wide based unsteady gait  ____________________________ IMPRESSIONS/PLAN  1. Severe pulmonary hypertension-I cannot see where she has been evaluated for pulmonary emboli as she did not have a CTA previously. By EKG and ECHO her pulmonary hypertension appears to have worsened over the past year 2. COPD 3. Aortic atherosclerosis and coronary atherosclerosis by calcification on CT scan 4. Possible alcoholism and alcohol abuse 5. Ongoing tobacco abuse  Recommendations:  Twelve-lead EKG personally reviewed by me shows RVH with right bundle branch block and right axis deviation which is worse in the previous EKG done a year ago. Recent chest x-ray following thoracentesis showed relatively clear lung fields. She is obviously volume overloaded with neck vein distention and V waves of tricuspid regurgitation. She may have pulmonary induced cor pulmonale or could have other reasons for pulmonary hypertension. At this point would recommend consultation with a pulmonary hypertension specialist soon. At the minimum she will need pulmonary function testing, evaluation for hypoxemia. We will go ahead and obtain a ventilation perfusion lung scan. Strongly advised tobacco cessation with counseling. Strongly advised cessation of alcohol.  Greater than 60 minutes spent with the patient including greater than 50% of the time spent face-to-face including coordination of care/counseling, consultation with pulmonary artery specialist regarding pulmonary hypertension, edema, and tobacco abuse. ____________________________ TODAYS ORDERS  1. 12 Lead EKG: Today  2. vent perfusion lung scan                       ____________________________ Cardiology Physician:  Darden Palmer MD [facility]

## 2017-07-18 ENCOUNTER — Other Ambulatory Visit: Payer: Self-pay | Admitting: Cardiology

## 2017-07-23 MED FILL — POTASSIUM CL ER 20 MEQ TABL: 20 | 90 days supply | Qty: 90 | Fill #0

## 2017-07-23 MED FILL — FUROSEMIDE 40 MG TAB: 40 | 90 days supply | Qty: 90 | Fill #0

## 2017-07-23 MED FILL — FOLIC ACID 1 MG TABLET: 1 | 90 days supply | Qty: 90 | Fill #0

## 2017-07-29 ENCOUNTER — Ambulatory Visit (HOSPITAL_COMMUNITY)
Admission: RE | Admit: 2017-07-29 | Discharge: 2017-07-29 | Disposition: A | Discharge status: Home / Self Care | Payer: Medicare Other | Source: Ambulatory Visit | Attending: Cardiology | Admitting: Cardiology

## 2017-07-29 DIAGNOSIS — I270 Primary pulmonary hypertension: Secondary | ICD-10-CM | POA: Diagnosis not present

## 2017-07-29 MED ORDER — TECHNETIUM TO 99M ALBUMIN AGGREGATED
4.4000 | Freq: Once | INTRAVENOUS | Status: AC | PRN
  Administered 2017-07-29: 4.4 via INTRAVENOUS

## 2017-07-29 MED ORDER — TECHNETIUM TC 99M DIETHYLENETRIAME-PENTAACETIC ACID
30.1000 | Freq: Once | INTRAVENOUS | Status: AC | PRN
  Administered 2017-07-29: 30.1 via INTRAVENOUS

## 2017-07-30 ENCOUNTER — Encounter (HOSPITAL_COMMUNITY): Payer: Self-pay | Admitting: Internal Medicine

## 2017-07-30 ENCOUNTER — Ambulatory Visit (HOSPITAL_COMMUNITY)
Admission: RE | Admit: 2017-07-30 | Discharge: 2017-07-30 | Disposition: A | Discharge status: Home / Self Care | Payer: Medicare Other | Source: Ambulatory Visit | Attending: Internal Medicine | Admitting: Internal Medicine

## 2017-07-30 VITALS — BP 142/82 | HR 115 | Wt 111.8 lb

## 2017-07-30 DIAGNOSIS — J449 Chronic obstructive pulmonary disease, unspecified: Secondary | ICD-10-CM | POA: Insufficient documentation

## 2017-07-30 DIAGNOSIS — J431 Panlobular emphysema: Secondary | ICD-10-CM

## 2017-07-30 DIAGNOSIS — F17210 Nicotine dependence, cigarettes, uncomplicated: Secondary | ICD-10-CM | POA: Diagnosis not present

## 2017-07-30 DIAGNOSIS — Z820 Family history of epilepsy and other diseases of the nervous system: Secondary | ICD-10-CM | POA: Diagnosis not present

## 2017-07-30 DIAGNOSIS — F1010 Alcohol abuse, uncomplicated: Secondary | ICD-10-CM | POA: Insufficient documentation

## 2017-07-30 DIAGNOSIS — E876 Hypokalemia: Secondary | ICD-10-CM | POA: Diagnosis not present

## 2017-07-30 DIAGNOSIS — I071 Rheumatic tricuspid insufficiency: Secondary | ICD-10-CM | POA: Insufficient documentation

## 2017-07-30 DIAGNOSIS — Z880 Allergy status to penicillin: Secondary | ICD-10-CM | POA: Insufficient documentation

## 2017-07-30 DIAGNOSIS — Z8379 Family history of other diseases of the digestive system: Secondary | ICD-10-CM | POA: Insufficient documentation

## 2017-07-30 DIAGNOSIS — I700 Atherosclerosis of aorta: Secondary | ICD-10-CM | POA: Insufficient documentation

## 2017-07-30 DIAGNOSIS — Z881 Allergy status to other antibiotic agents status: Secondary | ICD-10-CM | POA: Diagnosis not present

## 2017-07-30 DIAGNOSIS — Z79899 Other long term (current) drug therapy: Secondary | ICD-10-CM | POA: Insufficient documentation

## 2017-07-30 DIAGNOSIS — I272 Pulmonary hypertension, unspecified: Secondary | ICD-10-CM | POA: Insufficient documentation

## 2017-07-30 DIAGNOSIS — I5032 Chronic diastolic (congestive) heart failure: Secondary | ICD-10-CM | POA: Diagnosis not present

## 2017-07-30 DIAGNOSIS — Z888 Allergy status to other drugs, medicaments and biological substances status: Secondary | ICD-10-CM | POA: Insufficient documentation

## 2017-07-30 MED ORDER — SPIRONOLACTONE 25 MG PO TABS: 12.5000 mg | ORAL_TABLET | Freq: Every day | ORAL | 6 refills | Status: AC

## 2017-07-30 MED FILL — SPIRONOLACTONE 25 MG TABLET: 25 | 30 days supply | Qty: 15 | Fill #0

## 2017-07-30 NOTE — Patient Instructions (Signed)
Start Spironolactone 12.5 mg (1/2 tab) daily  Labs in 2 weeks   Your physician has recommended that you have a pulmonary function test. Pulmonary Function Tests are a group of tests that measure how well air moves in and out of your lungs.  Overnight Oximetry Test, [facility] will contact you to schedule this  You have been referred to Pulmonary Rehab, they will call you to schedule, this may take a few weeks  Your physician recommends that you schedule a follow-up appointment in: 3-4 months

## 2017-07-30 NOTE — Progress Notes (Signed)
Advanced Heart Failure Clinic Note   Referring Physician: Dr Donnie Aho PCP: Sigmund Hazel, MD PCP-Cardiologist: No primary care provider on file.   HPI: Rachel Williams is a 67 y.o. female (who recently retired form [employer]) with a history of pulmonary HTN, COPD, current smoker, current ETOH use with a history of ETOH abuse, and tricuspid regurgitation  She was hospitalized 3/19-3/21/2019 for acute diastolic HF and PNA. She was diuresed with IV lasix, then transitioned to lasix 40 mg daily. Echo during that admission showed EF 60-65%, grade 1 DD, markedly dilted RV with mild RV dysfunction, mod-severe TR, and PA peak pressure of 76 mmHg. She required a thoracentesis on right side. Discharged to ALF. DC weight 140 lbs.   She saw Dr Donnie Aho 07/17/17. He ordered a V/Q scan to evaluate for cause of pulmonary HTN and referred her to advanced HF clinic for further evaluation and management.   Now at [facility] in Assisted Living. Says she feels fine. Denies dyspnea. No edema, orthopnea or PND. Still smokes about 8 cigarettes/day. Takes lasix every day. Has severe back pain due to compression fractures. Denies CP or pressure. Says they checked her oxygen levels and they are "always in the 90s". No syncope or presyncope.   Echo 06/25/17 - Left ventricle: The cavity size was normal. Wall thickness was   normal. Systolic function was normal. The estimated ejection   fraction was in the range of 60% to 65%. Wall motion was normal;   there were no regional wall motion abnormalities. Doppler   parameters are consistent with abnormal left ventricular   relaxation (grade 1 diastolic dysfunction). - Ventricular septum: The contour showed diastolic flattening and   systolic flattening. - Left atrium: The atrium was mildly dilated. - Right ventricle: The cavity size was moderately dilated. Systolic   function was mildly reduced. - Right atrium: The atrium was moderately dilated. -  Tricuspid valve: There was moderate-severe regurgitation. - Pulmonary arteries: Systolic pressure was severely increased. PA   peak pressure: 76 mm Hg (S).  Review of Systems: [y] = yes, [ ]  = no   General: Weight gain [ ] ; Weight loss [ ] ; Anorexia [ ] ; Fatigue [ ] ; Fever [ ] ; Chills [ ] ; Weakness [ ]   Cardiac: Chest pain/pressure [ ] ; Resting SOB [ ] ; Exertional SOB [ ] ; Orthopnea [ ] ; Pedal Edema [ ] ; Palpitations [ ] ; Syncope [ ] ; Presyncope [ ] ; Paroxysmal nocturnal dyspnea[ ]   Pulmonary: Cough [ ] ; Wheezing[ ] ; Hemoptysis[ ] ; Sputum [ ] ; Snoring [ ]   GI: Vomiting[ ] ; Dysphagia[ ] ; Melena[ ] ; Hematochezia [ ] ; Heartburn[ ] ; Abdominal pain [ ] ; Constipation [ ] ; Diarrhea [ ] ; BRBPR [ ]   GU: Hematuria[ ] ; Dysuria [ ] ; Nocturia[ ]   Vascular: Pain in legs with walking [ ] ; Pain in feet with lying flat [ ] ; Non-healing sores [ ] ; Stroke [ ] ; TIA [ ] ; Slurred speech [ ] ;  Neuro: Headaches[ ] ; Vertigo[ ] ; Seizures[ ] ; Paresthesias[ ] ;Blurred vision [ ] ; Diplopia [ ] ; Vision changes [ ]   Ortho/Skin: [place] ]; Joint pain [ y]; Muscle pain [ ] ; Joint swelling [ ] ; Back Pain [ ] ; Rash [ ]   Psych: Depression[ ] ; Anxiety[ ]   Heme: Bleeding problems [ ] ; Clotting disorders [ ] ; Anemia [ ]   Endocrine: Diabetes [ ] ; Thyroid dysfunction[ ]    Past Medical History:  Diagnosis Date  . Acute CHF (congestive heart failure) (HCC) 06/24/2017  . ETOH abuse   . Pneumonia  Current Outpatient Medications  Medication Sig Dispense Refill  . folic acid (FOLVITE) 1 MG tablet Take 1 tablet (1 mg total) by mouth daily. 30 tablet 0  . furosemide (LASIX) 40 MG tablet Take 1 tablet (40 mg total) by mouth daily. 30 tablet 0  . ibuprofen (ADVIL,MOTRIN) 200 MG tablet Take 400 mg by mouth every 4 (four) hours as needed.    . Potassium Chloride ER 20 MEQ TBCR Take 20 mEq by mouth daily. 14 tablet 0  . thiamine 100 MG tablet Take 1 tablet (100 mg total) by mouth daily. (Patient not taking: Reported on 07/30/2017)  30 tablet 0   No current facility-administered medications for this encounter.     Allergies  Allergen Reactions  . Ciprofloxacin Rash  . Levofloxacin Rash    Rash on back after 5 days therapy.   . Nickel Rash    Anything with nickel in it.   Marland Kitchen. Penicillins Rash    Has patient had a PCN reaction causing immediate rash, facial/tongue/throat swelling, SOB or lightheadedness with hypotension: No Has patient had a PCN reaction causing severe rash involving mucus membranes or skin necrosis: No Ha patient had a PCN reaction that required hospitalization No Has patient had a PCN reaction occurring within the last 10 years: No If all of the above answers are "NO", then may proceed with Cephalosporin use. Had simple rash around age 67, needed no further care        Social History   Socioeconomic History  . Marital status: Married    Spouse name: Not on file  . Number of children: Not on file  . Years of education: Not on file  . Highest education level: Not on file  Occupational History  . Not on file  Social Needs  . Financial resource strain: Not on file  . Food insecurity:    Worry: Not on file    Inability: Not on file  . Transportation needs:    Medical: Not on file    Non-medical: Not on file  Tobacco Use  . Smoking status: Current Every Day Smoker    Packs/day: 1.00    Years: 50.00    Pack years: 50.00    Types: Cigarettes  . Smokeless tobacco: Never Used  Substance and Sexual Activity  . Alcohol use: Yes    Alcohol/week: 8.4 oz    Types: 14 Standard drinks or equivalent per week    Comment: daily, >6 per daughter  . Drug use: No  . Sexual activity: Not on file  Lifestyle  . Physical activity:    Days per week: Not on file    Minutes per session: Not on file  . Stress: Not on file  Relationships  . Social connections:    Talks on phone: Not on file    Gets together: Not on file    Attends religious service: Not on file    Active member of club or  organization: Not on file    Attends meetings of clubs or organizations: Not on file    Relationship status: Not on file  . Intimate partner violence:    Fear of current or ex partner: Not on file    Emotionally abused: Not on file    Physically abused: Not on file    Forced sexual activity: Not on file  Other Topics Concern  . Not on file  Social History Narrative  . Not on file      Family History  Problem Relation  Age of Onset  . Alzheimer's disease Mother   . Cancer Sister   . Cirrhosis Father   . Hemochromatosis Other     Vitals:   07/30/17 1324  BP: (!) 142/82  Pulse: (!) 115  SpO2: 96%  Weight: 111 lb 12.8 oz (50.7 kg)   Filed Weights   07/30/17 1324  Weight: 111 lb 12.8 oz (50.7 kg)    PHYSICAL EXAM: General:  Elderly frail appealing. No respiratory difficulty HEENT: normal Neck: supple. JVP 7-8 prominent CV wavs Carotids 2+ bilat; no bruits. No lymphadenopathy or thyromegaly appreciated. Cor: PMI nondisplaced. Tachy regualar. 3/6 TR + RV lift  Lungs: clear with decreased BS throughout. No wheeze Abdomen: soft, nontender, nondistended. No hepatosplenomegaly. No bruits or masses. Good bowel sounds. Extremities: no cyanosis, clubbing, rash, BLE 1-2+ edema with varicose veins  Neuro: alert & oriented x 3, cranial nerves grossly intact. moves all 4 extremities w/o difficulty. Affect pleasant.  ECG: ST with RBBB, 112 bpm   ASSESSMENT & PLAN:  1. Pulmonary HTN. PA peak pressure 76 mm Hg on Echo 06/2017 - VQ scan with normal perfusion, abnormal ventilation favoring parenchymal lung disease - PFTs - Overnight oximetry to assess for desaturation - She is not interested in RHC or further bloodwork at this time.   2. Chronic diastolic HF. Echo 05/2015: EF 60-65%, trivial MR, severely reduced RV, mod dilation of RA, mild TR, PA peak pressure 56. Echo 06/2017: EF 60-65%, grade I DD, RV mildly reduced, mod RA dilation, mild LA dilation, mod-severe TR, PA peak pressure  76 mmHg - NYHA II - Volume status elevated - Continue lasix 40 mg daily - Start spiro 12.5 mg daily. BMET 2 weeks  3. Tricuspid regurgitation - Mod to severe on echo 06/2017  4. Aortic atherosclerosis on CT scan - No s/s ischemia  5. Tobacco use - Smokes 8 cigarettes/day - Encouraged to quit completely   6. Alcohol use - Drinks 2 glasses of wine daily   Alford Highland, NP 07/30/17   Patient seen and examined with the above-signed Advanced Practice Provider and/or Housestaff. I personally reviewed laboratory data, imaging studies and relevant notes. I independently examined the patient and formulated the important aspects of the plan. I have edited the note to reflect any of my changes or salient points. I have personally discussed the plan with the patient and/or family.  Rachel Williams is 67 y/o former [employer] employee with long h/o tobacco use who was recently admitted with volume overload and PNA. Echo showed significant PAH with RV strain. Had f/u with Dr. Donnie Aho who ordered V/Q scan and referred her for further evaluation in the [facility]. VQ showed relatively normal perfusion with ventilation defects compatible with COPD. Recent CT reviewed showed no acute PE.   She is currently at ALF and says she feels fine. 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 We discussed the traditional work-up with serology and RHC to further define but she was not interested in pursuing at this time. We have agreed to check PFTs with DLCO, overnight oximetry. We will also add spironolactone 12.5 mg daily to help manage volume and her hypokalemia. Will repeat labs in 1-2 weeks. I also encouraged her to limit tobacco use as much as possible.   We will see back in 6-8 weeks.   Arvilla Meres, MD  7:34 PM

## 2017-08-07 ENCOUNTER — Telehealth (HOSPITAL_COMMUNITY): Payer: Self-pay

## 2017-08-08 ENCOUNTER — Telehealth (HOSPITAL_COMMUNITY): Payer: Self-pay

## 2017-08-08 ENCOUNTER — Encounter (HOSPITAL_COMMUNITY): Payer: Self-pay

## 2017-08-08 NOTE — Progress Notes (Signed)
RN spoke with patient in great length regarding pulmonary rehab program and referral. Spoke with patient regarding pulmonary rehab referral. Patient states she was not interested in the program at this time and did not feel she needed PR. She denied any shortness of breath or deconditioning or need for an improvement in her stamina and strength. She stated she is scheduled to see an orthopedist tomorrow (Saturday) for her severe back pain and felt moderate aerobic exercise on equipment was something she was not able to do related to her pain. She also stated she did not drive and her daughter (who currently works) would not be able to bring her to [place] every Tuesday and Thursday. She requested referral to be closed. Encouraged patient to contact MD if she would like our services in the future and new referral would be placed. Current referral to pulmonary rehab will be closed.

## 2017-08-13 ENCOUNTER — Inpatient Hospital Stay (HOSPITAL_COMMUNITY): Admission: RE | Admit: 2017-08-13 | Payer: Self-pay | Source: Ambulatory Visit

## 2017-08-15 ENCOUNTER — Inpatient Hospital Stay (HOSPITAL_COMMUNITY)
Admission: EM | Admit: 2017-08-15 | Discharge: 2017-08-29 | DRG: 871 | Disposition: A | Discharge status: Skilled Nursing Facility | Payer: Medicare Other | Attending: Internal Medicine | Admitting: Internal Medicine

## 2017-08-15 ENCOUNTER — Inpatient Hospital Stay (HOSPITAL_COMMUNITY): Payer: Medicare Other

## 2017-08-15 ENCOUNTER — Other Ambulatory Visit: Payer: Self-pay

## 2017-08-15 ENCOUNTER — Emergency Department (HOSPITAL_COMMUNITY): Payer: Medicare Other

## 2017-08-15 ENCOUNTER — Encounter (HOSPITAL_COMMUNITY): Payer: Self-pay | Admitting: Emergency Medicine

## 2017-08-15 DIAGNOSIS — F1010 Alcohol abuse, uncomplicated: Secondary | ICD-10-CM | POA: Diagnosis present

## 2017-08-15 DIAGNOSIS — R6521 Severe sepsis with septic shock: Secondary | ICD-10-CM | POA: Diagnosis present

## 2017-08-15 DIAGNOSIS — M4850XA Collapsed vertebra, not elsewhere classified, site unspecified, initial encounter for fracture: Secondary | ICD-10-CM

## 2017-08-15 DIAGNOSIS — J9601 Acute respiratory failure with hypoxia: Secondary | ICD-10-CM | POA: Diagnosis present

## 2017-08-15 DIAGNOSIS — R34 Anuria and oliguria: Secondary | ICD-10-CM | POA: Diagnosis present

## 2017-08-15 DIAGNOSIS — K7290 Hepatic failure, unspecified without coma: Secondary | ICD-10-CM | POA: Diagnosis present

## 2017-08-15 DIAGNOSIS — R1030 Lower abdominal pain, unspecified: Secondary | ICD-10-CM | POA: Diagnosis not present

## 2017-08-15 DIAGNOSIS — A419 Sepsis, unspecified organism: Secondary | ICD-10-CM | POA: Diagnosis present

## 2017-08-15 DIAGNOSIS — N179 Acute kidney failure, unspecified: Secondary | ICD-10-CM | POA: Diagnosis present

## 2017-08-15 DIAGNOSIS — Z720 Tobacco use: Secondary | ICD-10-CM | POA: Diagnosis not present

## 2017-08-15 DIAGNOSIS — Z681 Body mass index (BMI) 19 or less, adult: Secondary | ICD-10-CM | POA: Diagnosis not present

## 2017-08-15 DIAGNOSIS — R0602 Shortness of breath: Secondary | ICD-10-CM

## 2017-08-15 DIAGNOSIS — F17210 Nicotine dependence, cigarettes, uncomplicated: Secondary | ICD-10-CM | POA: Diagnosis present

## 2017-08-15 DIAGNOSIS — I2729 Other secondary pulmonary hypertension: Secondary | ICD-10-CM | POA: Diagnosis present

## 2017-08-15 DIAGNOSIS — E43 Unspecified severe protein-calorie malnutrition: Secondary | ICD-10-CM | POA: Diagnosis present

## 2017-08-15 DIAGNOSIS — T502X5A Adverse effect of carbonic-anhydrase inhibitors, benzothiadiazides and other diuretics, initial encounter: Secondary | ICD-10-CM | POA: Diagnosis present

## 2017-08-15 DIAGNOSIS — I5033 Acute on chronic diastolic (congestive) heart failure: Secondary | ICD-10-CM | POA: Diagnosis present

## 2017-08-15 DIAGNOSIS — K7460 Unspecified cirrhosis of liver: Secondary | ICD-10-CM | POA: Diagnosis not present

## 2017-08-15 DIAGNOSIS — I5032 Chronic diastolic (congestive) heart failure: Secondary | ICD-10-CM

## 2017-08-15 DIAGNOSIS — R1032 Left lower quadrant pain: Secondary | ICD-10-CM

## 2017-08-15 DIAGNOSIS — Z5181 Encounter for therapeutic drug level monitoring: Secondary | ICD-10-CM | POA: Diagnosis not present

## 2017-08-15 DIAGNOSIS — R339 Retention of urine, unspecified: Secondary | ICD-10-CM | POA: Diagnosis present

## 2017-08-15 DIAGNOSIS — R109 Unspecified abdominal pain: Secondary | ICD-10-CM

## 2017-08-15 DIAGNOSIS — R5381 Other malaise: Secondary | ICD-10-CM | POA: Diagnosis present

## 2017-08-15 DIAGNOSIS — F17200 Nicotine dependence, unspecified, uncomplicated: Secondary | ICD-10-CM

## 2017-08-15 DIAGNOSIS — R32 Unspecified urinary incontinence: Secondary | ICD-10-CM | POA: Diagnosis present

## 2017-08-15 DIAGNOSIS — A4902 Methicillin resistant Staphylococcus aureus infection, unspecified site: Secondary | ICD-10-CM

## 2017-08-15 DIAGNOSIS — Z933 Colostomy status: Secondary | ICD-10-CM | POA: Diagnosis not present

## 2017-08-15 DIAGNOSIS — R0600 Dyspnea, unspecified: Secondary | ICD-10-CM

## 2017-08-15 DIAGNOSIS — J9600 Acute respiratory failure, unspecified whether with hypoxia or hypercapnia: Secondary | ICD-10-CM

## 2017-08-15 DIAGNOSIS — Z9049 Acquired absence of other specified parts of digestive tract: Secondary | ICD-10-CM | POA: Diagnosis not present

## 2017-08-15 DIAGNOSIS — E876 Hypokalemia: Secondary | ICD-10-CM | POA: Diagnosis present

## 2017-08-15 DIAGNOSIS — F1099 Alcohol use, unspecified with unspecified alcohol-induced disorder: Secondary | ICD-10-CM | POA: Diagnosis not present

## 2017-08-15 DIAGNOSIS — B9562 Methicillin resistant Staphylococcus aureus infection as the cause of diseases classified elsewhere: Secondary | ICD-10-CM | POA: Diagnosis present

## 2017-08-15 DIAGNOSIS — E162 Hypoglycemia, unspecified: Secondary | ICD-10-CM | POA: Diagnosis present

## 2017-08-15 DIAGNOSIS — J449 Chronic obstructive pulmonary disease, unspecified: Secondary | ICD-10-CM | POA: Diagnosis present

## 2017-08-15 DIAGNOSIS — K659 Peritonitis, unspecified: Secondary | ICD-10-CM | POA: Diagnosis not present

## 2017-08-15 DIAGNOSIS — K7031 Alcoholic cirrhosis of liver with ascites: Secondary | ICD-10-CM | POA: Diagnosis present

## 2017-08-15 DIAGNOSIS — I071 Rheumatic tricuspid insufficiency: Secondary | ICD-10-CM | POA: Diagnosis present

## 2017-08-15 DIAGNOSIS — R6520 Severe sepsis without septic shock: Secondary | ICD-10-CM | POA: Diagnosis not present

## 2017-08-15 DIAGNOSIS — I2722 Pulmonary hypertension due to left heart disease: Secondary | ICD-10-CM | POA: Diagnosis not present

## 2017-08-15 DIAGNOSIS — R509 Fever, unspecified: Secondary | ICD-10-CM | POA: Diagnosis not present

## 2017-08-15 DIAGNOSIS — R188 Other ascites: Secondary | ICD-10-CM

## 2017-08-15 DIAGNOSIS — K651 Peritoneal abscess: Secondary | ICD-10-CM

## 2017-08-15 DIAGNOSIS — J90 Pleural effusion, not elsewhere classified: Secondary | ICD-10-CM

## 2017-08-15 DIAGNOSIS — D72829 Elevated white blood cell count, unspecified: Secondary | ICD-10-CM | POA: Diagnosis not present

## 2017-08-15 DIAGNOSIS — Z792 Long term (current) use of antibiotics: Secondary | ICD-10-CM | POA: Diagnosis not present

## 2017-08-15 HISTORY — PX: IR PARACENTESIS: IMG2679

## 2017-08-15 HISTORY — PX: IR IMAGE GUIDED DRAINAGE PERCUT CATH  PERITONEAL RETROPERIT: IMG5467

## 2017-08-15 LAB — CBC WITH DIFFERENTIAL/PLATELET
Band Neutrophils: 26 %
Basophils Absolute: 0 10*3/uL (ref 0.0–0.1)
Basophils Relative: 0 %
Blasts: 0 %
Eosinophils Absolute: 0 10*3/uL (ref 0.0–0.7)
Eosinophils Relative: 0 %
HCT: 38.3 % (ref 36.0–46.0)
Hemoglobin: 13.1 g/dL (ref 12.0–15.0)
Lymphocytes Relative: 4 %
Lymphs Abs: 1.4 10*3/uL (ref 0.7–4.0)
MCH: 28.5 pg (ref 26.0–34.0)
MCHC: 34.2 g/dL (ref 30.0–36.0)
MCV: 83.4 fL (ref 78.0–100.0)
Metamyelocytes Relative: 1 %
Monocytes Absolute: 0.4 10*3/uL (ref 0.1–1.0)
Monocytes Relative: 1 %
Myelocytes: 0 %
Neutro Abs: 33.7 10*3/uL — ABNORMAL HIGH (ref 1.7–7.7)
Neutrophils Relative %: 68 %
Other: 0 %
Platelets: 303 10*3/uL (ref 150–400)
Promyelocytes Relative: 0 %
RBC: 4.59 MIL/uL (ref 3.87–5.11)
RDW: 15.7 % — ABNORMAL HIGH (ref 11.5–15.5)
WBC Morphology: INCREASED
WBC: 35.5 10*3/uL — ABNORMAL HIGH (ref 4.0–10.5)
nRBC: 0 /100 WBC

## 2017-08-15 LAB — COMPREHENSIVE METABOLIC PANEL
ALT: 13 U/L — ABNORMAL LOW (ref 14–54)
AST: 31 U/L (ref 15–41)
Albumin: 2.3 g/dL — ABNORMAL LOW (ref 3.5–5.0)
Alkaline Phosphatase: 194 U/L — ABNORMAL HIGH (ref 38–126)
Anion gap: 17 — ABNORMAL HIGH (ref 5–15)
BUN: 37 mg/dL — ABNORMAL HIGH (ref 6–20)
CO2: 30 mmol/L (ref 22–32)
Calcium: 9.2 mg/dL (ref 8.9–10.3)
Chloride: 94 mmol/L — ABNORMAL LOW (ref 101–111)
Creatinine, Ser: 1.2 mg/dL — ABNORMAL HIGH (ref 0.44–1.00)
GFR calc Af Amer: 52 mL/min — ABNORMAL LOW (ref >60)
GFR calc non Af Amer: 45 mL/min — ABNORMAL LOW (ref >60)
Glucose, Bld: 67 mg/dL (ref 65–99)
Potassium: 2.7 mmol/L — CL (ref 3.5–5.1)
Sodium: 141 mmol/L (ref 135–145)
Total Bilirubin: 3 mg/dL — ABNORMAL HIGH (ref 0.3–1.2)
Total Protein: 7.2 g/dL (ref 6.5–8.1)

## 2017-08-15 LAB — I-STAT CHEM 8, ED
BUN: 42 mg/dL — ABNORMAL HIGH (ref 6–20)
Calcium, Ion: 1.06 mmol/L — ABNORMAL LOW (ref 1.15–1.40)
Chloride: 95 mmol/L — ABNORMAL LOW (ref 101–111)
Creatinine, Ser: 1.2 mg/dL — ABNORMAL HIGH (ref 0.44–1.00)
Glucose, Bld: 78 mg/dL (ref 65–99)
HCT: 41 % (ref 36.0–46.0)
Hemoglobin: 13.9 g/dL (ref 12.0–15.0)
Potassium: 3 mmol/L — ABNORMAL LOW (ref 3.5–5.1)
Sodium: 139 mmol/L (ref 135–145)
TCO2: 33 mmol/L — ABNORMAL HIGH (ref 22–32)

## 2017-08-15 LAB — MAGNESIUM: Magnesium: 1.9 mg/dL (ref 1.7–2.4)

## 2017-08-15 LAB — I-STAT CG4 LACTIC ACID, ED
Lactic Acid, Venous: 2.33 mmol/L (ref 0.5–1.9)
Lactic Acid, Venous: 1.43 mmol/L (ref 0.5–1.9)

## 2017-08-15 LAB — CBC
HEMATOCRIT: 34.5 % — AB (ref 36.0–46.0)
HEMOGLOBIN: 11.2 g/dL — AB (ref 12.0–15.0)
MCH: 27.1 pg (ref 26.0–34.0)
MCHC: 32.5 g/dL (ref 30.0–36.0)
MCV: 83.3 fL (ref 78.0–100.0)
Platelets: 287 10*3/uL (ref 150–400)
RBC: 4.14 MIL/uL (ref 3.87–5.11)
RDW: 15.4 % (ref 11.5–15.5)
WBC: 38.3 10*3/uL — AB (ref 4.0–10.5)

## 2017-08-15 LAB — LIPASE, BLOOD: Lipase: 18 U/L (ref 11–51)

## 2017-08-15 LAB — PROTIME-INR
INR: 1.65
Prothrombin Time: 19.3 seconds — ABNORMAL HIGH (ref 11.4–15.2)

## 2017-08-15 LAB — I-STAT TROPONIN, ED: Troponin i, poc: 0.01 ng/mL (ref 0.00–0.08)

## 2017-08-15 LAB — BRAIN NATRIURETIC PEPTIDE: B Natriuretic Peptide: 2427.4 pg/mL — ABNORMAL HIGH (ref 0.0–100.0)

## 2017-08-15 MED ORDER — SODIUM CHLORIDE 0.9% FLUSH
5.0000 mL | Freq: Three times a day (TID) | INTRAVENOUS | Status: DC
  Administered 2017-08-15 – 2017-08-28 (×26): 5 mL

## 2017-08-15 MED ORDER — POTASSIUM CHLORIDE 10 MEQ/100ML IV SOLN
10.0000 meq | INTRAVENOUS | Status: AC
  Administered 2017-08-15 (×4): 10 meq via INTRAVENOUS
  Filled 2017-08-15 (×4): qty 100

## 2017-08-15 MED ORDER — ENOXAPARIN SODIUM 40 MG/0.4ML ~~LOC~~ SOLN
40.0000 mg | SUBCUTANEOUS | Status: DC
  Administered 2017-08-15: 40 mg via SUBCUTANEOUS
  Filled 2017-08-15 (×2): qty 0.4

## 2017-08-15 MED ORDER — ACETAMINOPHEN 325 MG PO TABS
650.0000 mg | ORAL_TABLET | Freq: Four times a day (QID) | ORAL | Status: DC | PRN
  Administered 2017-08-16 – 2017-08-24 (×2): 650 mg via ORAL
  Filled 2017-08-15 (×2): qty 2

## 2017-08-15 MED ORDER — KCL IN DEXTROSE-NACL 40-5-0.9 MEQ/L-%-% IV SOLN
INTRAVENOUS | Status: DC
  Administered 2017-08-15: 16:00:00 via INTRAVENOUS
  Filled 2017-08-15: qty 1000

## 2017-08-15 MED ORDER — SODIUM CHLORIDE 0.9 % IV BOLUS: 500.0000 mL | Freq: Once | INTRAVENOUS | Status: DC

## 2017-08-15 MED ORDER — ONDANSETRON HCL 4 MG/2ML IJ SOLN
4.0000 mg | Freq: Four times a day (QID) | INTRAMUSCULAR | Status: DC | PRN
  Administered 2017-08-16 – 2017-08-23 (×5): 4 mg via INTRAVENOUS
  Filled 2017-08-15 (×5): qty 2

## 2017-08-15 MED ORDER — MIDAZOLAM HCL 2 MG/2ML IJ SOLN
INTRAMUSCULAR | Status: AC
  Administered 2017-08-15: 16:00:00
  Filled 2017-08-15: qty 2

## 2017-08-15 MED ORDER — FENTANYL CITRATE (PF) 100 MCG/2ML IJ SOLN
INTRAMUSCULAR | Status: AC | PRN
  Administered 2017-08-15: 25 ug via INTRAVENOUS

## 2017-08-15 MED ORDER — FENTANYL CITRATE (PF) 100 MCG/2ML IJ SOLN
INTRAMUSCULAR | Status: AC
  Administered 2017-08-15: 16:00:00
  Filled 2017-08-15: qty 2

## 2017-08-15 MED ORDER — NICOTINE 14 MG/24HR TD PT24
14.0000 mg | MEDICATED_PATCH | Freq: Every day | TRANSDERMAL | Status: DC
  Administered 2017-08-16 – 2017-08-29 (×14): 14 mg via TRANSDERMAL
  Filled 2017-08-15 (×14): qty 1

## 2017-08-15 MED ORDER — SODIUM CHLORIDE 0.9 % IV BOLUS
500.0000 mL | Freq: Once | INTRAVENOUS | Status: AC
  Administered 2017-08-15: 500 mL via INTRAVENOUS

## 2017-08-15 MED ORDER — ACETAMINOPHEN 650 MG RE SUPP: 650.0000 mg | Freq: Four times a day (QID) | RECTAL | Status: DC | PRN

## 2017-08-15 MED ORDER — FENTANYL CITRATE (PF) 100 MCG/2ML IJ SOLN
25.0000 ug | Freq: Once | INTRAMUSCULAR | Status: AC
  Administered 2017-08-15: 25 ug via INTRAVENOUS
  Filled 2017-08-15: qty 2

## 2017-08-15 MED ORDER — MAGNESIUM SULFATE 2 GM/50ML IV SOLN
2.0000 g | Freq: Once | INTRAVENOUS | Status: AC
Start: 2017-08-15 — End: 2017-08-15
  Administered 2017-08-15: 2 g via INTRAVENOUS
  Filled 2017-08-15: qty 50

## 2017-08-15 MED ORDER — GUAIFENESIN-DM 100-10 MG/5ML PO SYRP
5.0000 mL | ORAL_SOLUTION | ORAL | Status: DC | PRN
  Administered 2017-08-15 – 2017-08-18 (×12): 5 mL via ORAL
  Filled 2017-08-15 (×12): qty 5

## 2017-08-15 MED ORDER — POTASSIUM CHLORIDE 20 MEQ/15ML (10%) PO SOLN
40.0000 meq | Freq: Once | ORAL | Status: AC
  Administered 2017-08-15: 40 meq via ORAL
  Filled 2017-08-15: qty 30

## 2017-08-15 MED ORDER — METRONIDAZOLE IN NACL 5-0.79 MG/ML-% IV SOLN
500.0000 mg | Freq: Three times a day (TID) | INTRAVENOUS | Status: DC
  Administered 2017-08-15 – 2017-08-16 (×2): 500 mg via INTRAVENOUS
  Filled 2017-08-15 (×2): qty 100

## 2017-08-15 MED ORDER — ONDANSETRON HCL 4 MG PO TABS: 4.0000 mg | ORAL_TABLET | Freq: Four times a day (QID) | ORAL | Status: DC | PRN

## 2017-08-15 MED ORDER — FUROSEMIDE 10 MG/ML IJ SOLN
20.0000 mg | Freq: Once | INTRAMUSCULAR | Status: AC
  Administered 2017-08-15: 20 mg via INTRAVENOUS
  Filled 2017-08-15: qty 2

## 2017-08-15 MED ORDER — HYDROMORPHONE HCL 1 MG/ML IJ SOLN
0.5000 mg | INTRAMUSCULAR | Status: DC | PRN
  Administered 2017-08-15 – 2017-08-19 (×12): 0.5 mg via INTRAVENOUS
  Filled 2017-08-15 (×12): qty 0.5

## 2017-08-15 MED ORDER — SODIUM CHLORIDE 0.9 % IV SOLN
2.0000 g | Freq: Once | INTRAVENOUS | Status: AC
  Administered 2017-08-15: 2 g via INTRAVENOUS
  Filled 2017-08-15: qty 20

## 2017-08-15 MED ORDER — LIDOCAINE HCL 1 % IJ SOLN
INTRAMUSCULAR | Status: AC
  Administered 2017-08-15: 16:00:00
  Filled 2017-08-15: qty 20

## 2017-08-15 MED ORDER — MIDAZOLAM HCL 2 MG/2ML IJ SOLN
INTRAMUSCULAR | Status: AC | PRN
  Administered 2017-08-15: 0.5 mg via INTRAVENOUS

## 2017-08-15 MED ORDER — HYDROMORPHONE HCL 2 MG/ML IJ SOLN
0.5000 mg | INTRAMUSCULAR | Status: DC | PRN
  Administered 2017-08-15: 0.5 mg via INTRAVENOUS
  Filled 2017-08-15: qty 1

## 2017-08-15 MED ORDER — IOHEXOL 300 MG/ML  SOLN
100.0000 mL | Freq: Once | INTRAMUSCULAR | Status: AC | PRN
  Administered 2017-08-15: 75 mL via INTRAVENOUS

## 2017-08-15 MED ORDER — SODIUM CHLORIDE 0.9 % IV SOLN
1.0000 g | INTRAVENOUS | Status: DC
  Filled 2017-08-15: qty 10

## 2017-08-15 MED ORDER — LIDOCAINE HCL (PF) 1 % IJ SOLN
INTRAMUSCULAR | Status: AC | PRN
  Administered 2017-08-15: 8 mL

## 2017-08-15 MED ORDER — METRONIDAZOLE IN NACL 5-0.79 MG/ML-% IV SOLN
500.0000 mg | Freq: Once | INTRAVENOUS | Status: AC
  Administered 2017-08-15: 500 mg via INTRAVENOUS
  Filled 2017-08-15: qty 100

## 2017-08-15 NOTE — H&P (Signed)
History and Physical    TRANISE FORREST Williams:[id number] DOB: Dec 22, 1950 DOA: 08/15/2017    PCP: Sigmund Hazel, MD  Patient coming from: Assisted living at [facility]  Chief Complaint: abdominal pain  HPI: Rachel Williams is a 67 y.o. female with medical history of diastolic heart failure, severe pulmonary hypertension per echo, moderate to severe tricuspid regurgitation, nicotine abuse alcohol use, colon perforation in the past with a colectomy and a colostomy that was reversed.  She presents after being checked at her PCPs office with left upper and lower abdominal pain for about 5 to 6 days now, nausea and vomiting yesterday and high WBC count. In the ED she is noted to have a heart rate in the 120s, respiratory rate in the 20s and blood pressure of 88/67.  She admits to pain in her left abdomen going from her ribs downward.  Currently not having any vomiting and only mild nausea.  She has not had any diarrhea.  She has urinated in her depends and is incontinent.  She has not noted any fevers or chills.  She was admitted from 06/24/2017 through 06/26/2017 for acute diastolic heart failure and suspected right-sided pneumonia and discharged to a skilled nursing facility on diuretics.  Her primary cardiologist is Dr. Donnie Aho who referred her to the heart failure clinic and she followed up with the heart failure clinic on 4/24.  Recommended to continue Lasix and start Spironolactone 12.5 mg daily.  She has lost about 40 lb since being started on diuretics.  ED Course: Potassium 3.0, lactic acid 2.33 improving to 1.43, WBC count 35.5, mostly neutrophils T bili 3.0, BNP 2427 -CT scan shows the paracolic gutter cystic structure/abscess with loculated ascites and lymphadenopathy.  Review of Systems:  Has back pain for which she takes naproxen started about 1 wk ago Has a chronic cough worse when she lays on her back - weight loss mentioned as above All other systems reviewed and apart from HPI, are  negative.  Past Medical History:  Diagnosis Date  . Acute CHF (congestive heart failure) (HCC) 06/24/2017  . ETOH abuse   . Pneumonia     Past Surgical History:  Procedure Laterality Date  . COLON RESECTION     perforation  . COLOSTOMY      Social History:   reports that she has been smoking cigarettes.  Smokes about 1.3 of ppd and drinks 2 glasses of wine a day. Was drinking heavier at one point. Assisted living.  Allergies  Allergen Reactions  . Ciprofloxacin Rash  . Levofloxacin Rash    Rash on back after 5 days therapy.   . Nickel Rash    Anything with nickel in it.   Marland Kitchen Penicillins Rash    Has patient had a PCN reaction causing immediate rash, facial/tongue/throat swelling, SOB or lightheadedness with hypotension: No Has patient had a PCN reaction causing severe rash involving mucus membranes or skin necrosis: No Ha patient had a PCN reaction that required hospitalization No Has patient had a PCN reaction occurring within the last 10 years: No If all of the above answers are "NO", then may proceed with Cephalosporin use. Had simple rash around age 12, needed no further care      Family History  Problem Relation Age of Onset  . Alzheimer's disease Mother   . Cancer Sister   . Cirrhosis Father   . Hemochromatosis Other      Prior to Admission medications   Medication Sig Start Date End Date  Taking? Authorizing Provider  acetaminophen (TYLENOL) 500 MG tablet Take 1,000 mg by mouth as needed for mild pain.   Yes [provider]  folic acid (FOLVITE) 1 MG tablet Take 1 tablet (1 mg total) by mouth daily. 06/26/17  Yes Burnadette Pop, MD  furosemide (LASIX) 40 MG tablet Take 1 tablet (40 mg total) by mouth daily. 06/27/17  Yes Burnadette Pop, MD  naproxen sodium (ALEVE) 220 MG tablet Take 440 mg by mouth daily as needed (pain).   Yes [provider]  Potassium Chloride ER 20 MEQ TBCR Take 20 mEq by mouth daily. 06/26/17  Yes Burnadette Pop, MD    spironolactone (ALDACTONE) 25 MG tablet Take 0.5 tablets (12.5 mg total) by mouth daily. 07/30/17 10/28/17 Yes Bensimhon, Bevelyn Buckles, MD  thiamine 100 MG tablet Take 1 tablet (100 mg total) by mouth daily. Patient not taking: Reported on 07/30/2017 06/26/17   Burnadette Pop, MD    Physical Exam: Wt Readings from Last 3 Encounters:  08/15/17 46.7 kg (103 lb)  07/30/17 50.7 kg (111 lb 12.8 oz)  06/26/17 59.5 kg (131 lb 1.6 oz)   Vitals:   08/15/17 1016 08/15/17 1030 08/15/17 1045 08/15/17 1100  BP:  (!) 88/57 (!) 81/56 (!) 88/67  Pulse:  (!) 111 (!) 114 (!) 111  Resp:  (!) 26 (!) 23 (!) 27  Temp: 97.7 F (36.5 C)     TempSrc: Oral     SpO2:  99% 98% 100%  Weight:      Height:          Constitutional: NAD, calm, comfortable- thin, frail. Eyes: PERTLA, lids and conjunctivae normal ENMT: Mucous membranes are dry. Posterior pharynx clear of any exudate or lesions. Normal dentition.  Neck: normal, supple, no masses, no thyromegaly Respiratory: clear to auscultation bilaterally, no wheezing, no crackles. Normal respiratory effort. No accessory muscle use.  Cardiovascular: S1 & S2 heard, regular rate and rhythm tachycardic, No extremity edema. 2+ pedal pulses. No carotid bruits.  Abdomen: No distension,  Tenderness diffusely but mostly in left lower quadrent, no masses palpated. No hepatosplenomegaly. Bowel sounds normal.  Musculoskeletal: no clubbing / cyanosis. No joint deformity upper and lower extremities. Good ROM, no contractures. Normal muscle tone.  Skin: no rashes, lesions, ulcers. No induration Neurologic: CN 2-12 grossly intact. Sensation intact, DTR normal. Strength 5/5 in all 4 limbs.  Psychiatric: Normal judgment and insight. Alert and oriented x 3. Normal mood.     Labs on Admission: I have personally reviewed following labs and imaging studies  CBC: Recent Labs  Lab 08/15/17 1002 08/15/17 1032  WBC 35.5*  --   NEUTROABS 33.7*  --   HGB 13.1 13.9  HCT 38.3 41.0   MCV 83.4  --   PLT 303  --    Basic Metabolic Panel: Recent Labs  Lab 08/15/17 1002 08/15/17 1032  NA 141 139  K 2.7* 3.0*  CL 94* 95*  CO2 30  --   GLUCOSE 67 78  BUN 37* 42*  CREATININE 1.20* 1.20*  CALCIUM 9.2  --    GFR: Estimated Creatinine Clearance: 32.6 mL/min (A) (by C-G formula based on SCr of 1.2 mg/dL (H)). Liver Function Tests: Recent Labs  Lab 08/15/17 1002  AST 31  ALT 13*  ALKPHOS 194*  BILITOT 3.0*  PROT 7.2  ALBUMIN 2.3*   Recent Labs  Lab 08/15/17 1002  LIPASE 18   No results for input(s): AMMONIA in the last 168 hours. Coagulation Profile: No results for  input(s): INR, PROTIME in the last 168 hours. Cardiac Enzymes: No results for input(s): CKTOTAL, CKMB, CKMBINDEX, TROPONINI in the last 168 hours. BNP (last 3 results) No results for input(s): PROBNP in the last 8760 hours. HbA1C: No results for input(s): HGBA1C in the last 72 hours. CBG: No results for input(s): GLUCAP in the last 168 hours. Lipid Profile: No results for input(s): CHOL, HDL, LDLCALC, TRIG, CHOLHDL, LDLDIRECT in the last 72 hours. Thyroid Function Tests: No results for input(s): TSH, T4TOTAL, FREET4, T3FREE, THYROIDAB in the last 72 hours. Anemia Panel: No results for input(s): VITAMINB12, FOLATE, FERRITIN, TIBC, IRON, RETICCTPCT in the last 72 hours. Urine analysis:    Component Value Date/Time   COLORURINE AMBER (A) 05/22/2016 1025   APPEARANCEUR CLOUDY (A) 05/22/2016 1025   LABSPEC 1.020 05/22/2016 1025   PHURINE 5.0 05/22/2016 1025   GLUCOSEU NEGATIVE 05/22/2016 1025   HGBUR MODERATE (A) 05/22/2016 1025   BILIRUBINUR SMALL (A) 05/22/2016 1025   KETONESUR NEGATIVE 05/22/2016 1025   PROTEINUR NEGATIVE 05/22/2016 1025   UROBILINOGEN 1.0 01/11/2010 1611   NITRITE NEGATIVE 05/22/2016 1025   LEUKOCYTESUR MODERATE (A) 05/22/2016 1025   Sepsis Labs: (procalcitonin:4,lacticidven:4) )No results found for this or any previous visit (from the past 240  hour(s)).   Radiological Exams on Admission: Ct Abdomen Pelvis W Contrast  Result Date: 08/15/2017 CLINICAL DATA:  Generalized abdominal pain with hypertension EXAM: CT ABDOMEN AND PELVIS WITH CONTRAST TECHNIQUE: Multidetector CT imaging of the abdomen and pelvis was performed using the standard protocol following bolus administration of intravenous contrast. CONTRAST:  75mL OMNIPAQUE IOHEXOL 300 MG/ML  SOLN COMPARISON:  04/07/2003 FINDINGS: Lower chest: Large right heart. Atherosclerotic calcification. No acute finding Hepatobiliary: Questionable lobulation of the liver surface. The caudate lobe is also large.No evidence of biliary obstruction or stone. Pancreas: Generalized atrophy. Spleen: Negative.  No definite enlargement. Adrenals/Urinary Tract: Negative adrenals. No hydronephrosis or stone. Unremarkable bladder. Stomach/Bowel: Evidence of prior sigmoidectomy. There is extensive colonic diverticulosis. Suspect appendectomy. The distal gastric wall is thickened by submucosal low-density. No discrete ulcer or mass is seen. Vascular/Lymphatic: Extensive atherosclerotic plaque on the aorta and visceral branches. Prominent mesenteric and upper retroperitoneal lymph nodes which may be related to the above. Reproductive:IUD in the atrophic uterus. Other: Overall moderate ascites mainly seen about the liver and in the pelvis where there is signs of loculation and peritoneal thickening/enhancement. Areas of greatest peritoneal thickening seen below the liver, in the ventral left upper quadrant, and along the left pelvic sidewall. There is also a multilobulated thick walled cystic collection in the left paracolic gutter measuring up to 9 x 8.4 cm. Regional inflammation is mild. An abscess was seen in this location in 2004. Musculoskeletal: T10 and T11 vertebral body fractures that were seen on prior CT. There is interval T12 inferior endplate, L1 superior endplate, and L2 superior endplate fractures with mild  depression. No retropulsion. These results were called by telephone at the time of interpretation on 08/15/2017 at 11:48 am to Dr. Buel Ream , who verbally acknowledged these results. IMPRESSION: 1. 9 cm irregular cystic mass in the left paracolic gutter, favor abscess over cavitary neoplasm. No pneumoperitoneum or adjacent bowel perforation to explain this collection, suspect missed perforation that has subsequently sealed. It is noted that there was an abscess in this location in 2004 by CT. 2. Moderate ascites with loculation and peritonitis findings, presumably from the same. 3. Suspect cirrhosis.  Consider SBP as cause of above. 4. Distal gastritis. 5. Mild mesenteric and upper retroperitoneal  adenopathy presumably reactive to the above. Suggest EGD after convalescence. 6. T12, L1, and L2 endplate fractures that have occurred since chest CT 06/25/2017. There are older endplate fractures at T10 and T11. Electronically Signed   By: Marnee Spring M.D.   On: 08/15/2017 12:00   Dg Chest Port 1 View  Result Date: 08/15/2017 CLINICAL DATA:  Hypoxia. EXAM: PORTABLE CHEST 1 VIEW COMPARISON:  Chest x-ray dated July 28, 2017. FINDINGS: The heart size and mediastinal contours are within normal limits. Slightly increased peripheral interstitial markings are more conspicuous when compared to prior study. Possible trace left pleural effusion. Low lung volumes with bibasilar atelectasis. No consolidation or pneumothorax. No acute osseous abnormality. IMPRESSION: New mild interstitial edema and possible trace left pleural effusion. Electronically Signed   By: Obie Dredge M.D.   On: 08/15/2017 10:54    EKG: Independently reviewed. Sinus tachycardia with LBBB  Assessment/Plan Principal Problem:   Severe sepsis  - abdominal abscess possibly from diverticulitis and ascites which is likely infected fluid as well. - plan per gen surgery is to request an IR consult for drain placement as she would not do well  with surgery -She is allowed to have ice chips for now - cont abx- rocephin and Flagyl chosen as she has a rash from PCN and Cipro - Lactic acid has normalized - pain-wise, she is quite comfortable - will need to go to SDU and will need to be careful with IVF as she has d CHF- started on D5NS with 40 KCL at 75 cc/hr   Active Problems: Cirrhosis of the liver-hyperbilirubinemia and ascites -No history of hepatitis, no blood transfusions or IV drug abuse in the past and therefore possibly secondary to chronic alcohol abuse -per general surgery who is reviewed the CT scan, ascites is very minimal -We will obtain a hepatitis panel for tomorrow - awaiting INR which I have ordered- no signs of encephalopathy -Follow-up on bilirubin level tomorrow- she will need an outpatient GI eval    Hypokalemia - replace via IV- likely due to diuretics    Smoker - 14 mg patch ordered    Chronic diastolic CHF (congestive heart failure)   -Her chest x-ray suggests she may have some mild pulmonary edema and oxygen level is 96% on 2 L she is not in any respiratory distress and hypoxia may be related to sepsis - unfortunately we have to hold diuretics and give slow IV fluids - follow I and O- have ordered a purewick  ETOH abuse - should not go into withdrawal as she drinks 2 glasses of wine a day  Back pain-thoracic compression fractures -  was evaluated by orthopedic surgery and started on Naproxen about 1 wk ago-there was no role for surgery-    DVT prophylaxis: SCDs- INR pending  Code Status: Full code Family Communication: Daughter Landrey Mahurin who is the main decision maker-I have also spoken with the patient's son who is in the room Disposition Plan: SDU  Consults called: gen surgery who will call IR Admission status: inpatient    Calvert Cantor MD [facility] Pager: [email or web address] Password [email or web address] [phone], please contact night-coverage   08/15/2017, 2:09 PM

## 2017-08-15 NOTE — Procedures (Signed)
Interventional Radiology Procedure Note  Procedure:  1.) Paracentesis yielding 400 mL milky tan ascites 2.) Placement of a 21F drain into the LLQ abdominal abscess with return of 200 mL thick pus.  Drain left to JP bulb.   Complications: NOne  Estimated Blood Loss: None  Recommendations: - Ascites very likely infected c/w bacterial peritonitis - LLQ abscess contained thick pus.  Pt is very thin and drainage catheter is relatively superficial.     Signed,  Sterling Big, MD

## 2017-08-15 NOTE — Consult Note (Signed)
Chief Complaint: Patient was seen in consultation today for abscess drain placement Chief Complaint  Patient presents with  . Abdominal Pain  . hypotensive   at the request of Dr Fredonia Highland   Supervising Physician: Malachy Moan  Patient Status: [facility] - ED  History of Present Illness: Rachel Williams is a 67 y.o. female   Smoker Diastolic HF; Pulm HTN Left upper abd lower abd pain x 6 days N/V Leukocytosis  HR 120s; BP 88/67  CT today:  IMPRESSION: 1. 9 cm irregular cystic mass in the left paracolic gutter, favor abscess over cavitary neoplasm. No pneumoperitoneum or adjacent bowel perforation to explain this collection, suspect missed perforation that has subsequently sealed. It is noted that there was an abscess in this location in 2004 by CT. 2. Moderate ascites with loculation and peritonitis findings, presumably from the same. 3. Suspect cirrhosis.  Consider SBP as cause of above. 4. Distal gastritis. 5. Mild mesenteric and upper retroperitoneal adenopathy presumably reactive to the above. Suggest EGD after convalescence. 6. T12, L1, and L2 endplate fractures that have occurred since chest CT 06/25/2017. There are older endplate fractures at T10 and T11.   [facility] calling requesting abscess drain placement Sepsis-- pt not surgical candidate  Dr Archer Asa has reviewed imaging and approves procedure for today ** to include paracentesis**    Past Medical History:  Diagnosis Date  . Acute CHF (congestive heart failure) (HCC) 06/24/2017  . ETOH abuse   . Pneumonia     Past Surgical History:  Procedure Laterality Date  . COLON RESECTION     perforation  . COLOSTOMY      Allergies: Ciprofloxacin; Levofloxacin; Nickel; and Penicillins  Medications: Prior to Admission medications   Medication Sig Start Date End Date Taking? Authorizing Provider  acetaminophen (TYLENOL) 500 MG tablet Take 1,000 mg by mouth as needed for mild pain.   Yes [provider]  folic acid (FOLVITE) 1 MG tablet Take 1 tablet (1 mg total) by mouth daily. 06/26/17  Yes Burnadette Pop, MD  furosemide (LASIX) 40 MG tablet Take 1 tablet (40 mg total) by mouth daily. 06/27/17  Yes Burnadette Pop, MD  naproxen sodium (ALEVE) 220 MG tablet Take 440 mg by mouth daily as needed (pain).   Yes [provider]  Potassium Chloride ER 20 MEQ TBCR Take 20 mEq by mouth daily. 06/26/17  Yes Burnadette Pop, MD  spironolactone (ALDACTONE) 25 MG tablet Take 0.5 tablets (12.5 mg total) by mouth daily. 07/30/17 10/28/17 Yes Bensimhon, Bevelyn Buckles, MD  thiamine 100 MG tablet Take 1 tablet (100 mg total) by mouth daily. Patient not taking: Reported on 07/30/2017 06/26/17   Burnadette Pop, MD     Family History  Problem Relation Age of Onset  . Alzheimer's disease Mother   . Cancer Sister   . Cirrhosis Father   . Hemochromatosis Other     Social History   Socioeconomic History  . Marital status: Married    Spouse name: Not on file  . Number of children: Not on file  . Years of education: Not on file  . Highest education level: Not on file  Occupational History  . Not on file  Social Needs  . Financial resource strain: Not on file  . Food insecurity:    Worry: Not on file    Inability: Not on file  . Transportation needs:    Medical: Not on file    Non-medical: Not on file  Tobacco Use  . Smoking  status: Current Every Day Smoker    Packs/day: 1.00    Years: 50.00    Pack years: 50.00    Types: Cigarettes  . Smokeless tobacco: Never Used  Substance and Sexual Activity  . Alcohol use: Yes    Alcohol/week: 8.4 oz    Types: 14 Standard drinks or equivalent per week    Comment: daily, >6 per daughter  . Drug use: No  . Sexual activity: Not on file  Lifestyle  . Physical activity:    Days per week: Not on file    Minutes per session: Not on file  . Stress: Not on file  Relationships  . Social connections:    Talks on phone: Not on file    Gets  together: Not on file    Attends religious service: Not on file    Active member of club or organization: Not on file    Attends meetings of clubs or organizations: Not on file    Relationship status: Not on file  Other Topics Concern  . Not on file  Social History Narrative  . Not on file    Review of Systems: A 12 point ROS discussed and pertinent positives are indicated in the HPI above.  All other systems are negative.  Review of Systems  Constitutional: Positive for activity change, appetite change and fatigue. Negative for fever.  Respiratory: Negative for cough and shortness of breath.   Cardiovascular: Negative for chest pain.  Gastrointestinal: Positive for abdominal distention, abdominal pain, nausea and vomiting.  Musculoskeletal: Positive for back pain.  Neurological: Positive for weakness.  Psychiatric/Behavioral: Negative for behavioral problems and confusion.    Vital Signs: BP (!) 88/67   Pulse (!) 111   Temp 97.7 F (36.5 C) (Oral)   Resp (!) 27   Ht  (1.575 m)   Wt 103 lb (46.7 kg)   SpO2 100%   BMI 18.84 kg/m   Physical Exam  Constitutional: She is oriented to person, place, and time.  Cardiovascular: Normal rate.  Murmur heard. Pulmonary/Chest: Effort normal and breath sounds normal.  Abdominal: Normal appearance. Bowel sounds are decreased. There is tenderness in the left upper quadrant and left lower quadrant.  Neurological: She is alert and oriented to person, place, and time.  Skin: Skin is warm and dry.  Psychiatric: She has a normal mood and affect. Her behavior is normal.  Nursing note and vitals reviewed.   Imaging: Ct Abdomen Pelvis W Contrast  Result Date: 08/15/2017 CLINICAL DATA:  Generalized abdominal pain with hypertension EXAM: CT ABDOMEN AND PELVIS WITH CONTRAST TECHNIQUE: Multidetector CT imaging of the abdomen and pelvis was performed using the standard protocol following bolus administration of intravenous contrast.  CONTRAST:  75mL OMNIPAQUE IOHEXOL 300 MG/ML  SOLN COMPARISON:  04/07/2003 FINDINGS: Lower chest: Large right heart. Atherosclerotic calcification. No acute finding Hepatobiliary: Questionable lobulation of the liver surface. The caudate lobe is also large.No evidence of biliary obstruction or stone. Pancreas: Generalized atrophy. Spleen: Negative.  No definite enlargement. Adrenals/Urinary Tract: Negative adrenals. No hydronephrosis or stone. Unremarkable bladder. Stomach/Bowel: Evidence of prior sigmoidectomy. There is extensive colonic diverticulosis. Suspect appendectomy. The distal gastric wall is thickened by submucosal low-density. No discrete ulcer or mass is seen. Vascular/Lymphatic: Extensive atherosclerotic plaque on the aorta and visceral branches. Prominent mesenteric and upper retroperitoneal lymph nodes which may be related to the above. Reproductive:IUD in the atrophic uterus. Other: Overall moderate ascites mainly seen about the liver and in the pelvis where there is signs  of loculation and peritoneal thickening/enhancement. Areas of greatest peritoneal thickening seen below the liver, in the ventral left upper quadrant, and along the left pelvic sidewall. There is also a multilobulated thick walled cystic collection in the left paracolic gutter measuring up to 9 x 8.4 cm. Regional inflammation is mild. An abscess was seen in this location in 2004. Musculoskeletal: T10 and T11 vertebral body fractures that were seen on prior CT. There is interval T12 inferior endplate, L1 superior endplate, and L2 superior endplate fractures with mild depression. No retropulsion. These results were called by telephone at the time of interpretation on 08/15/2017 at 11:48 am to Dr. Buel Ream , who verbally acknowledged these results. IMPRESSION: 1. 9 cm irregular cystic mass in the left paracolic gutter, favor abscess over cavitary neoplasm. No pneumoperitoneum or adjacent bowel perforation to explain this  collection, suspect missed perforation that has subsequently sealed. It is noted that there was an abscess in this location in 2004 by CT. 2. Moderate ascites with loculation and peritonitis findings, presumably from the same. 3. Suspect cirrhosis.  Consider SBP as cause of above. 4. Distal gastritis. 5. Mild mesenteric and upper retroperitoneal adenopathy presumably reactive to the above. Suggest EGD after convalescence. 6. T12, L1, and L2 endplate fractures that have occurred since chest CT 06/25/2017. There are older endplate fractures at T10 and T11. Electronically Signed   By: Marnee Spring M.D.   On: 08/15/2017 12:00   Nm Pulmonary Perf And Vent  Result Date: 07/29/2017 CLINICAL DATA:  Pulmonary hypertension, negative D-dimer EXAM: NUCLEAR MEDICINE VENTILATION - PERFUSION LUNG SCAN TECHNIQUE: Ventilation images were obtained in multiple projections using inhaled aerosol Tc-67m DTPA. Perfusion images were obtained in multiple projections after intravenous injection of Tc-63m-MAA. RADIOPHARMACEUTICALS:  30.1 mCi of Tc-34m DTPA aerosol inhalation and 4.4 mCi Tc49m-MAA IV COMPARISON:  None. Correlation: Chest radiograph 07/28/2017 FINDINGS: Ventilation: Multiple subsegmental ventilation defects Perfusion: Normal anteroposterior tracer distribution. Normal perfusion to both lungs. No segmental or subsegmental perfusion defects. IMPRESSION: Normal perfusion lung scan. Abnormal ventilation exam with multiple ventilatory defects in both lungs favoring parenchymal lung disease. Electronically Signed   By: Ulyses Southward M.D.   On: 07/29/2017 10:32   Dg Chest Port 1 View  Result Date: 08/15/2017 CLINICAL DATA:  Hypoxia. EXAM: PORTABLE CHEST 1 VIEW COMPARISON:  Chest x-ray dated July 28, 2017. FINDINGS: The heart size and mediastinal contours are within normal limits. Slightly increased peripheral interstitial markings are more conspicuous when compared to prior study. Possible trace left pleural effusion. Low  lung volumes with bibasilar atelectasis. No consolidation or pneumothorax. No acute osseous abnormality. IMPRESSION: New mild interstitial edema and possible trace left pleural effusion. Electronically Signed   By: Obie Dredge M.D.   On: 08/15/2017 10:54    Labs:  CBC: Recent Labs    06/24/17 2049 08/15/17 1002 08/15/17 1032  WBC 7.0 35.5*  --   HGB 12.1 13.1 13.9  HCT 36.8 38.3 41.0  PLT 243 303  --     COAGS: No results for input(s): INR, APTT in the last 8760 hours.  BMP: Recent Labs    06/24/17 2049 06/25/17 0428 06/26/17 0500 08/15/17 1002 08/15/17 1032  NA 138 140 142 141 139  K 3.8 3.6 3.1* 2.7* 3.0*  CL 104 105 103 94* 95*  CO2 --   GLUCOSE 110* 93 118* 67 78  BUN 37* 42*  CALCIUM 9.0 8.8* 8.4* 9.2  --   CREATININE 0.58 0.54  0.49 1.20* 1.20*  GFRNONAA >60 >60 >60 45*  --   GFRAA >60 >60 >60 52*  --     LIVER FUNCTION TESTS: Recent Labs    06/24/17 2049 08/15/17 1002  BILITOT 0.7 3.0*  AST 43* 31  ALT 15 13*  ALKPHOS 181* 194*  PROT 7.0 7.2  ALBUMIN 2.9* 2.3*    TUMOR MARKERS: No results for input(s): AFPTM, CEA, CA199, CHROMGRNA in the last 8760 hours.  Assessment and Plan:  Left abdominal abscess For paracentesis and drain placement Risks and benefits discussed with the patient including bleeding, infection, damage to adjacent structures, bowel perforation/fistula connection, and sepsis.  All of the patient's questions were answered, patient is agreeable to proceed. Consent signed and in chart.   Thank you for this interesting consult.  I greatly enjoyed meeting Rachel Williams and look forward to participating in their care.  A copy of this report was sent to the requesting provider on this date.  Electronically Signed: Robet Leu, PA-C 08/15/2017, 2:39 PM   I spent a total of 40 Minutes    in face to face in clinical consultation, greater than 50% of which was counseling/coordinating care for abdominal  abscess drain

## 2017-08-15 NOTE — Consult Note (Signed)
Rachel Williams 29-Mar-1951  [id number].    Requesting MD: Dr. 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  She was admitted in March of this year for PNA and acute HF.  She was 140lbs at that time and has diuresed 37lbs as of today with lasix and spironolactone since that time.  She also has a history of COPD and smokes 1/3 ppd for over 50 years.  She also has a history of diverticulitis with abscess in 2004.  She had a drain placed but apparently ended requiring a Hartman's procedure and subsequent reversal.  During her stay in March she had CT scan findings concerning for possible cirrhosis.  She has a history of heavier alcohol use prior to independent living one year ago.  She currently drinks 2 glasses of wine per day.  On Sunday, she began to have some left-sided abdominal pain.  No other symptoms at that time.  She denies fevers, chills, diarrhea, or N/V until last night/this morning.  She denies any blood in her stool or emesis.  Her daughter finally convinced her to see her PCP this morning and was found to have a WBC of 46K.  She was immediately referred to the ED for evaluation.  She is hypotensive and tachycardic here with a WBC of 35K, TB of 3, Cr of 1.2, BNP of 2,427.  INR is currently pending.  She had a CT scan of the abd/pel which revealed a large 9cm irregular cystic mass in the left paracolic gutter.  Unknown etiology, but thought it is possible that she had a diverticular perforation that has sealed itself off and now developed an abscess.  She is also noted to have some ascites with some peritoneal inflammation.  She is thought to have cirrhosis as well.  We have been asked to see her for further evaluation and recommendation.  ROS: ROS: Please see HPI, otherwise all other systems have been reviewed and  are essentially negative.  She mobilizes with a walker in independent living.    Family History  Problem Relation Age of Onset  . Alzheimer's disease Mother   . Cancer Sister   . Cirrhosis Father   . Hemochromatosis Other     Past Medical History:  Diagnosis Date  . Acute CHF (congestive heart failure) ([place]) 06/24/2017  . ETOH abuse   . Pneumonia     Past Surgical History:  Procedure Laterality Date  . COLON RESECTION     perforation  . COLOSTOMY      Social History:  reports that she has been smoking cigarettes.  She has a 50.00 pack-year smoking history. She has never used smokeless tobacco. She reports that she drinks about 8.4 oz of alcohol per week. She reports that she does not use drugs.  Allergies:  Allergies  Allergen Reactions  . Ciprofloxacin Rash  . Levofloxacin Rash    Rash on back after 5 days therapy.   . Nickel Rash    Anything with nickel in it.   Marland Kitchen Penicillins Rash    Has patient had a PCN reaction causing immediate rash, facial/tongue/throat swelling, SOB or lightheadedness with hypotension: No Has patient had a PCN reaction causing severe rash involving mucus membranes or skin necrosis: No Ha patient had a PCN reaction that required hospitalization No Has patient  had a PCN reaction occurring within the last 10 years: No If all of the above answers are "NO", then may proceed with Cephalosporin use. Had simple rash around age 73, needed no further care       (Not in a hospital admission)   Physical Exam: Blood pressure (!) 88/67, pulse (!) 111, temperature 97.7 F (36.5 C), temperature source Oral, resp. rate (!) 27, height '5\' 2"'  (1.575 m), weight 46.7 kg (103 lb), SpO2 100 %. General: pleasant, but chronically ill-appearing, cachectic white female who is laying in bed in NAD HEENT: head is normocephalic, atraumatic.  Sclera are noninjected.  PERRL.  Ears and nose without any masses or lesions.  Mouth is pink and moist Heart: sinus, but tachy.   Normal s1,s2. No obvious gallops, or rubs noted. +murmur.  Palpable radial and pedal pulses bilaterally Lungs: CTAB, no wheezes, rhonchi, or rales noted.  Respiratory effort nonlabored, but slightly tachypnic.  O2 in place. Abd: soft, diffusely tender with voluntary guarding, greatest in LLQ/LMQ. Prior midline incision and LLQ scars from previous surgeries noted. no masses, hernias, or organomegaly, but some laxity noted at her midline scar. MS: all 4 extremities are symmetrical with no cyanosis, clubbing, trace edema in LE Skin: warm and dry with no masses, lesions, or rashes, except her lower extremities are diaphoretic. Psych: A&Ox3 with an appropriate affect.   Results for orders placed or performed during the hospital encounter of 08/15/17 (from the past 48 hour(s))  Comprehensive metabolic panel     Status: Abnormal   Collection Time: 08/15/17 10:02 AM  Result Value Ref Range   Sodium 141 135 - 145 mmol/L   Potassium 2.7 (LL) 3.5 - 5.1 mmol/L    Comment: CRITICAL RESULT CALLED TO, READ BACK BY AND VERIFIED WITH: K COBB RN @ 1200 ON 08/15/17 BY HTEMOCHE    Chloride 94 (L) 101 - 111 mmol/L   CO2 30 22 - 32 mmol/L   Glucose, Bld 67 65 - 99 mg/dL   BUN 37 (H) 6 - 20 mg/dL   Creatinine, Ser 1.20 (H) 0.44 - 1.00 mg/dL   Calcium 9.2 8.9 - 10.3 mg/dL   Total Protein 7.2 6.5 - 8.1 g/dL   Albumin 2.3 (L) 3.5 - 5.0 g/dL   AST 31 15 - 41 U/L   ALT 13 (L) 14 - 54 U/L   Alkaline Phosphatase 194 (H) 38 - 126 U/L   Total Bilirubin 3.0 (H) 0.3 - 1.2 mg/dL   GFR calc non Af Amer 45 (L) >60 mL/min   GFR calc Af Amer 52 (L) >60 mL/min    Comment: (NOTE) The eGFR has been calculated using the CKD EPI equation. This calculation has not been validated in all clinical situations. eGFR's persistently <60 mL/min signify possible Chronic Kidney Disease.    Anion gap 17 (H) 5 - 15    Comment: Performed at [facility], [place] [address] [place], [place] [address]  CBC WITH DIFFERENTIAL      Status: Abnormal   Collection Time: 08/15/17 10:02 AM  Result Value Ref Range   WBC 35.5 (H) 4.0 - 10.5 K/uL   RBC 4.59 3.87 - 5.11 MIL/uL   Hemoglobin 13.1 12.0 - 15.0 g/dL   HCT 38.3 36.0 - 46.0 %   MCV 83.4 78.0 - 100.0 fL   MCH 28.5 26.0 - 34.0 pg   MCHC 34.2 30.0 - 36.0 g/dL   RDW 15.7 (H) 11.5 - 15.5 %   Platelets 303 150 -  400 K/uL   Neutrophils Relative % 68 %   Lymphocytes Relative 4 %   Monocytes Relative 1 %   Eosinophils Relative 0 %   Basophils Relative 0 %   Band Neutrophils 26 %   Metamyelocytes Relative 1 %   Myelocytes 0 %   Promyelocytes Relative 0 %   Blasts 0 %   nRBC 0 0 /100 WBC   Other 0 %   Neutro Abs 33.7 (H) 1.7 - 7.7 K/uL   Lymphs Abs 1.4 0.7 - 4.0 K/uL   Monocytes Absolute 0.4 0.1 - 1.0 K/uL   Eosinophils Absolute 0.0 0.0 - 0.7 K/uL   Basophils Absolute 0.0 0.0 - 0.1 K/uL   RBC Morphology ELLIPTOCYTES    WBC Morphology INCREASED BANDS (>20% BANDS)     Comment: VACUOLATED NEUTROPHILS Performed at [facility] [address], [place], [place] [address]   Brain natriuretic peptide     Status: Abnormal   Collection Time: 08/15/17 10:02 AM  Result Value Ref Range   B Natriuretic Peptide 2,427.4 (H) 0.0 - 100.0 pg/mL    Comment: Performed at [facility] [address] [place], [place] [address]  Lipase, blood     Status: None   Collection Time: 08/15/17 10:02 AM  Result Value Ref Range   Lipase 18 11 - 51 U/L    Comment: Performed at [facility] [address] [place],  [address]  I-stat troponin, ED     Status: None   Collection Time: 08/15/17 10:30 AM  Result Value Ref Range   Troponin i, poc 0.01 0.00 - 0.08 ng/mL   Comment 3            Comment: Due to the release kinetics of cTnI, a negative result within the first hours of the onset of symptoms does not rule out myocardial infarction with certainty. If myocardial infarction is still suspected, repeat the test at appropriate intervals.   I-stat Chem 8,  ED     Status: Abnormal   Collection Time: 08/15/17 10:32 AM  Result Value Ref Range   Sodium 139 135 - 145 mmol/L   Potassium 3.0 (L) 3.5 - 5.1 mmol/L   Chloride 95 (L) 101 - 111 mmol/L   BUN 42 (H) 6 - 20 mg/dL   Creatinine, Ser 1.20 (H) 0.44 - 1.00 mg/dL   Glucose, Bld 78 65 - 99 mg/dL   Calcium, Ion 1.06 (L) 1.15 - 1.40 mmol/L   TCO2 33 (H) 22 - 32 mmol/L   Hemoglobin 13.9 12.0 - 15.0 g/dL   HCT 41.0 36.0 - 46.0 %  I-Stat CG4 Lactic Acid, ED  (not at  [facility])     Status: Abnormal   Collection Time: 08/15/17 10:33 AM  Result Value Ref Range   Lactic Acid, Venous 2.33 (HH) 0.5 - 1.9 mmol/L   Comment NOTIFIED PHYSICIAN   I-Stat CG4 Lactic Acid, ED  (not at  [facility])     Status: None   Collection Time: 08/15/17  1:32 PM  Result Value Ref Range   Lactic Acid, Venous 1.43 0.5 - 1.9 mmol/L   Ct Abdomen Pelvis W Contrast  Result Date: 08/15/2017 CLINICAL DATA:  Generalized abdominal pain with hypertension EXAM: CT ABDOMEN AND PELVIS WITH CONTRAST TECHNIQUE: Multidetector CT imaging of the abdomen and pelvis was performed using the standard protocol following bolus administration of intravenous contrast. CONTRAST:  44m OMNIPAQUE IOHEXOL 300 MG/ML  SOLN COMPARISON:  04/07/2003 FINDINGS: Lower chest: Large  right heart. Atherosclerotic calcification. No acute finding Hepatobiliary: Questionable lobulation of the liver surface. The caudate lobe is also large.No evidence of biliary obstruction or stone. Pancreas: Generalized atrophy. Spleen: Negative.  No definite enlargement. Adrenals/Urinary Tract: Negative adrenals. No hydronephrosis or stone. Unremarkable bladder. Stomach/Bowel: Evidence of prior sigmoidectomy. There is extensive colonic diverticulosis. Suspect appendectomy. The distal gastric wall is thickened by submucosal low-density. No discrete ulcer or mass is seen. Vascular/Lymphatic: Extensive atherosclerotic plaque on the aorta and visceral branches. Prominent mesenteric and upper  retroperitoneal lymph nodes which may be related to the above. Reproductive:IUD in the atrophic uterus. Other: Overall moderate ascites mainly seen about the liver and in the pelvis where there is signs of loculation and peritoneal thickening/enhancement. Areas of greatest peritoneal thickening seen below the liver, in the ventral left upper quadrant, and along the left pelvic sidewall. There is also a multilobulated thick walled cystic collection in the left paracolic gutter measuring up to 9 x 8.4 cm. Regional inflammation is mild. An abscess was seen in this location in 2004. Musculoskeletal: T10 and T11 vertebral body fractures that were seen on prior CT. There is interval T12 inferior endplate, L1 superior endplate, and L2 superior endplate fractures with mild depression. No retropulsion. These results were called by telephone at the time of interpretation on 08/15/2017 at 11:48 am to Dr. Eliezer Mccoy , who verbally acknowledged these results. IMPRESSION: 1. 9 cm irregular cystic mass in the left paracolic gutter, favor abscess over cavitary neoplasm. No pneumoperitoneum or adjacent bowel perforation to explain this collection, suspect missed perforation that has subsequently sealed. It is noted that there was an abscess in this location in 2004 by CT. 2. Moderate ascites with loculation and peritonitis findings, presumably from the same. 3. Suspect cirrhosis.  Consider SBP as cause of above. 4. Distal gastritis. 5. Mild mesenteric and upper retroperitoneal adenopathy presumably reactive to the above. Suggest EGD after convalescence. 6. T12, L1, and L2 endplate fractures that have occurred since chest CT 06/25/2017. There are older endplate fractures at U98 and T11. Electronically Signed   By: Monte Fantasia M.D.   On: 08/15/2017 12:00   Dg Chest Port 1 View  Result Date: 08/15/2017 CLINICAL DATA:  Hypoxia. EXAM: PORTABLE CHEST 1 VIEW COMPARISON:  Chest x-ray dated July 28, 2017. FINDINGS: The heart size  and mediastinal contours are within normal limits. Slightly increased peripheral interstitial markings are more conspicuous when compared to prior study. Possible trace left pleural effusion. Low lung volumes with bibasilar atelectasis. No consolidation or pneumothorax. No acute osseous abnormality. IMPRESSION: New mild interstitial edema and possible trace left pleural effusion. Electronically Signed   By: Titus Dubin M.D.   On: 08/15/2017 10:54      Assessment/Plan Septic shock secondary to Intra-abdominal abscess The etiology of this abscess is not known, but felt mostly likely to be secondary to some type of possible colonic perforation that has since sealed itself back off that subsequently developed an abscess.  She has some ascites in her abdomen as well on her scan, which also appears to have been somewhat present on her Chest CT in March of this year as well.  She has some concerning findings for possible liver cirrhosis that may be contributing to this as well.  She could have ascites from her CHF as well.  She has inflammation of her peritoneal lining on her CT scan as well.  She is tender throughout her abdomen c/w these findings as well as sepsis.  Because of her  comorbidities, including cirrhosis, as well as previous surgeries, she has significant risk for complications with an intra-abdominal operation.  Because of this, we would like to try the less invasive approach first with a percutaneous drain knowing full and well that if this fails she will need a laparotomy with possible colostomy.  Agree with abx therapy and will defer to medicine/CCM if this needs to be broadened beyond just Rocephin and Flagyl.  Keep NPO for now.  Cirrhosis Family and patient were unaware of this diagnosis.  This was not mentioned to them despite being noted on her previous CT of her chest in March.  We are currently awaiting her coags to be able to further calculate what class of cirrhosis she may have.  In  regards to her acute problem, this diagnosis increases her morbidity/mortality with any type of intra-abdominal operation; however, she and her family are aware that if she does not improve with conservative measures, that she may require an operation.  Diastolic CHF/pulmonary HTN/tricuspid regurg - BNP 2,400 Hypokalemia - 2.7, defer to medicine for replacement COPD, tobacco abuse H/o ETOH abuse, still drinks 2 glasses of wine daily H/o compression fractures Malnutrition   Henreitta Cea, [facility] Surgery 08/15/2017, 2:11 PM Pager: [phone]

## 2017-08-15 NOTE — Sedation Documentation (Signed)
Patient is resting comfortably. 

## 2017-08-15 NOTE — ED Provider Notes (Signed)
Medical screening examination/treatment/procedure(s) were conducted as a shared visit with non-physician practitioner(s) and myself.  I personally evaluated the patient during the encounter.  Clinical Impression:   The patient is an ill-appearing 67 year old female who presents to the hospital with a complaint of left upper quadrant abdominal pain, hypotension and tachycardia coming from the doctor's office where the report was that she had a very high white blood cell count.  The patient denies fevers, she denies diarrhea or rectal bleeding.  She has lost over 40 pounds in the last several weeks but it is from diuresis as she had untreated congestive heart failure.  On exam the patient is tachypneic hypoxic requiring supplemental oxygen, she has a diffusely tender and mildly peritoneal abdomen specifically left upper quadrant and left midabdomen for pain.  She does take anti-inflammatories daily including ibuprofen and aspirin and drinks 2 glasses of wine per day.  She has had a prior perforated viscus requiring an ostomy in the past.  The patient will need an upright chest x-ray, stabilization for her hypotension and what I suspect a septic shock from some intraperitoneal source but will also need evaluation for pneumonia.  Critically ill patient.  .Critical Care Performed by: Eber Hong, MD Authorized by: Eber Hong, MD   Critical care provider statement:    Critical care time (minutes):  35   Critical care time was exclusive of:  Separately billable procedures and treating other patients and teaching time   Critical care was necessary to treat or prevent imminent or life-threatening deterioration of the following conditions:  Shock   Critical care was time spent personally by me on the following activities:  Blood draw for specimens, development of treatment plan with patient or surrogate, discussions with consultants, evaluation of patient's response to treatment, examination of  patient, obtaining history from patient or surrogate, ordering and performing treatments and interventions, ordering and review of laboratory studies, ordering and review of radiographic studies, pulse oximetry, re-evaluation of patient's condition and review of old charts    EKG Interpretation  Date/Time:  Friday Aug 15 2017 09:52:24 EDT Ventricular Rate:  127 PR Interval:    QRS Duration: 128 QT Interval:  314 QTC Calculation: 457 R Axis:   60 Text Interpretation:  Sinus tachycardia with irregular rate Probable left atrial enlargement Right bundle branch block Repol abnrm suggests ischemia, diffuse leads Baseline wander in lead(s) V4 since last tracing no significant change Confirmed by Eber Hong ([id number]) on 08/15/2017 10:06:04 AM      Final diagnoses:  Intra-abdominal abscess (HCC)  Left lower quadrant pain  Sepsis, due to unspecified organism [facility])        Eber Hong, MD 08/15/17 [phone]

## 2017-08-15 NOTE — ED Provider Notes (Signed)
[facility] EMERGENCY DEPARTMENT Provider Note   CSN: [id number] Arrival date & time: 08/15/17  6767     History   Chief Complaint Chief Complaint  Patient presents with  . Abdominal Pain  . hypotensive    HPI Rachel Williams is a 66 y.o. female with history of CHF, daily alcohol use who presents with a 5-day history of abdominal pain. Patient's pain is sharp and constant.  It does not radiate.  It is worse in the left upper abdomen. Patient began vomiting yesterday.  She saw her doctor at [facility] who sent her here for further evaluation. Per EMS, patient with white blood cell count to 46K.  Patient was given 1 L of normal saline prior to arrival.  Patient denies any fever, chills, chest pain, shortness of breath, new leg pain or swelling, diarrhea, urinary symptoms.  Patient has had normal bowel movements.  HPI  Past Medical History:  Diagnosis Date  . Acute CHF (congestive heart failure) ([place]) 06/24/2017  . ETOH abuse   . Pneumonia     Patient Active Problem List   Diagnosis Date Noted  . Severe sepsis ([place]) 08/15/2017  . Chronic diastolic CHF (congestive heart failure) ([place]) 08/15/2017  . Pulmonary hypertension ([place]) 06/26/2017  . CHF (congestive heart failure) ([place]) 06/25/2017  . Acute CHF (congestive heart failure) ([place]) 06/24/2017  . Right middle lobe pulmonary infiltrate 06/24/2017  . Sepsis, unspecified organism ([place]) 05/21/2016  . Elevated LFTs 05/21/2016  . Macrocytic anemia 05/21/2016  . Influenza A 05/21/2016  . Malnutrition of moderate degree 05/29/2015  . NSVT (nonsustained ventricular tachycardia) ([place]) 05/29/2015  . Hyponatremia 05/27/2015  . Recurrent falls 05/27/2015  . Facial bruising 05/27/2015  . Hypomagnesemia 05/27/2015  . Hypokalemia 05/27/2015  . Alcohol use ([place]), daily 2-3 drinks of vodka 05/27/2015  . Smoker 05/27/2015  . Dehydration 05/27/2015  . Vertebral compression fracture ([place]) 05/27/2015  . Acute pharyngitis 05/27/2015   . Chronic constipation 05/27/2015  . Generalized weakness     Past Surgical History:  Procedure Laterality Date  . COLON RESECTION     perforation  . COLOSTOMY       OB History   None      Home Medications    Prior to Admission medications   Medication Sig Start Date End Date Taking? Authorizing Provider  acetaminophen (TYLENOL) 500 MG tablet Take 1,000 mg by mouth as needed for mild pain.   Yes [provider]  folic acid (FOLVITE) 1 MG tablet Take 1 tablet (1 mg total) by mouth daily. 06/26/17  Yes Shelly Coss, MD  furosemide (LASIX) 40 MG tablet Take 1 tablet (40 mg total) by mouth daily. 06/27/17  Yes Shelly Coss, MD  naproxen sodium (ALEVE) 220 MG tablet Take 440 mg by mouth daily as needed (pain).   Yes [provider]  Potassium Chloride ER 20 MEQ TBCR Take 20 mEq by mouth daily. 06/26/17  Yes Shelly Coss, MD  spironolactone (ALDACTONE) 25 MG tablet Take 0.5 tablets (12.5 mg total) by mouth daily. 07/30/17 10/28/17 Yes Bensimhon, Shaune Pascal, MD  thiamine 100 MG tablet Take 1 tablet (100 mg total) by mouth daily. Patient not taking: Reported on 07/30/2017 06/26/17   Shelly Coss, MD    Family History Family History  Problem Relation Age of Onset  . Alzheimer's disease Mother   . Cancer Sister   . Cirrhosis Father   . Hemochromatosis Other     Social History Social History   Tobacco Use  .  Smoking status: Current Every Day Smoker    Packs/day: 1.00    Years: 50.00    Pack years: 50.00    Types: Cigarettes  . Smokeless tobacco: Never Used  Substance Use Topics  . Alcohol use: Yes    Alcohol/week: 8.4 oz    Types: 14 Standard drinks or equivalent per week    Comment: daily, >6 per daughter  . Drug use: No     Allergies   Ciprofloxacin; Levofloxacin; Nickel; and Penicillins   Review of Systems Review of Systems  Constitutional: Negative for chills and fever.  HENT: Negative for facial swelling and sore throat.     Respiratory: Negative for shortness of breath.   Cardiovascular: Negative for chest pain.  Gastrointestinal: Positive for abdominal pain, nausea and vomiting. Negative for blood in stool and diarrhea.  Genitourinary: Negative for dysuria, flank pain, frequency, urgency, vaginal bleeding and vaginal discharge.  Musculoskeletal: Negative for back pain.  Skin: Negative for rash and wound.  Neurological: Negative for headaches.  Psychiatric/Behavioral: The patient is not nervous/anxious.      Physical Exam Updated Vital Signs BP (!) 88/67   Pulse (!) 111   Temp 97.7 F (36.5 C) (Oral)   Resp (!) 27   Ht _0  (1.575 m)   Wt 46.7 kg (103 lb)   SpO2 100%   BMI 18.84 kg/m   Physical Exam  Constitutional: She appears well-developed and well-nourished. No distress.  Cachectic appearing, very thin  HENT:  Head: Normocephalic and atraumatic.  Mouth/Throat: Oropharynx is clear and moist. No oropharyngeal exudate.  Eyes: Pupils are equal, round, and reactive to light. Conjunctivae are normal. Right eye exhibits no discharge. Left eye exhibits no discharge. No scleral icterus.  Neck: Normal range of motion. Neck supple. No thyromegaly present.  Cardiovascular: Normal rate, regular rhythm, normal heart sounds and intact distal pulses. Exam reveals no gallop and no friction rub.  No murmur heard. Pulmonary/Chest: Effort normal and breath sounds normal. No stridor. No respiratory distress. She has no wheezes. She has no rales.  Abdominal: Soft. Bowel sounds are normal. She exhibits no distension. There is tenderness in the right upper quadrant and left upper quadrant. There is guarding. There is no rebound and no CVA tenderness.  Musculoskeletal: She exhibits no edema.  Lymphadenopathy:    She has no cervical adenopathy.  Neurological: She is alert. Coordination normal.  Skin: Skin is warm and dry. No rash noted. She is not diaphoretic. No pallor.  Psychiatric: She has a normal mood and  affect.  Nursing note and vitals reviewed.    ED Treatments / Results  Labs (all labs ordered are listed, but only abnormal results are displayed) Labs Reviewed  COMPREHENSIVE METABOLIC PANEL - Abnormal; Notable for the following components:      Result Value   Potassium 2.7 (*)    Chloride 94 (*)    BUN 37 (*)    Creatinine, Ser 1.20 (*)    Albumin 2.3 (*)    ALT 13 (*)    Alkaline Phosphatase 194 (*)    Total Bilirubin 3.0 (*)    GFR calc non Af Amer 45 (*)    GFR calc Af Amer 52 (*)    Anion gap 17 (*)    All other components within normal limits  CBC WITH DIFFERENTIAL/PLATELET - Abnormal; Notable for the following components:   WBC 35.5 (*)    RDW 15.7 (*)    Neutro Abs 33.7 (*)    All other components  within normal limits  BRAIN NATRIURETIC PEPTIDE - Abnormal; Notable for the following components:   B Natriuretic Peptide 2,427.4 (*)    All other components within normal limits  I-STAT CG4 LACTIC ACID, ED - Abnormal; Notable for the following components:   Lactic Acid, Venous 2.33 (*)    All other components within normal limits  I-STAT CHEM 8, ED - Abnormal; Notable for the following components:   Potassium 3.0 (*)    Chloride 95 (*)    BUN 42 (*)    Creatinine, Ser 1.20 (*)    Calcium, Ion 1.06 (*)    TCO2 33 (*)    All other components within normal limits  CULTURE, BLOOD (ROUTINE X 2)  CULTURE, BLOOD (ROUTINE X 2)  LIPASE, BLOOD  URINALYSIS, ROUTINE W REFLEX MICROSCOPIC  MAGNESIUM  PROTIME-INR  COMPREHENSIVE METABOLIC PANEL  I-STAT TROPONIN, ED  I-STAT CG4 LACTIC ACID, ED    EKG EKG Interpretation  Date/Time:  Friday Aug 15 2017 09:52:24 EDT Ventricular Rate:  127 PR Interval:    QRS Duration: 128 QT Interval:  314 QTC Calculation: 457 R Axis:   60 Text Interpretation:  Sinus tachycardia with irregular rate Probable left atrial enlargement Right bundle branch block Repol abnrm suggests ischemia, diffuse leads Baseline wander in lead(s) V4 since  last tracing no significant change Confirmed by Noemi Chapel [phone]) on 08/15/2017 10:06:04 AM   Radiology Ct Abdomen Pelvis W Contrast  Result Date: 08/15/2017 CLINICAL DATA:  Generalized abdominal pain with hypertension EXAM: CT ABDOMEN AND PELVIS WITH CONTRAST TECHNIQUE: Multidetector CT imaging of the abdomen and pelvis was performed using the standard protocol following bolus administration of intravenous contrast. CONTRAST:  63m OMNIPAQUE IOHEXOL 300 MG/ML  SOLN COMPARISON:  04/07/2003 FINDINGS: Lower chest: Large right heart. Atherosclerotic calcification. No acute finding Hepatobiliary: Questionable lobulation of the liver surface. The caudate lobe is also large.No evidence of biliary obstruction or stone. Pancreas: Generalized atrophy. Spleen: Negative.  No definite enlargement. Adrenals/Urinary Tract: Negative adrenals. No hydronephrosis or stone. Unremarkable bladder. Stomach/Bowel: Evidence of prior sigmoidectomy. There is extensive colonic diverticulosis. Suspect appendectomy. The distal gastric wall is thickened by submucosal low-density. No discrete ulcer or mass is seen. Vascular/Lymphatic: Extensive atherosclerotic plaque on the aorta and visceral branches. Prominent mesenteric and upper retroperitoneal lymph nodes which may be related to the above. Reproductive:IUD in the atrophic uterus. Other: Overall moderate ascites mainly seen about the liver and in the pelvis where there is signs of loculation and peritoneal thickening/enhancement. Areas of greatest peritoneal thickening seen below the liver, in the ventral left upper quadrant, and along the left pelvic sidewall. There is also a multilobulated thick walled cystic collection in the left paracolic gutter measuring up to 9 x 8.4 cm. Regional inflammation is mild. An abscess was seen in this location in 2004. Musculoskeletal: T10 and T11 vertebral body fractures that were seen on prior CT. There is interval T12 inferior endplate, L1  superior endplate, and L2 superior endplate fractures with mild depression. No retropulsion. These results were called by telephone at the time of interpretation on 08/15/2017 at 11:48 am to Dr. AEliezer Mccoy, who verbally acknowledged these results. IMPRESSION: 1. 9 cm irregular cystic mass in the left paracolic gutter, favor abscess over cavitary neoplasm. No pneumoperitoneum or adjacent bowel perforation to explain this collection, suspect missed perforation that has subsequently sealed. It is noted that there was an abscess in this location in 2004 by CT. 2. Moderate ascites with loculation and peritonitis findings, presumably from the  same. 3. Suspect cirrhosis.  Consider SBP as cause of above. 4. Distal gastritis. 5. Mild mesenteric and upper retroperitoneal adenopathy presumably reactive to the above. Suggest EGD after convalescence. 6. T12, L1, and L2 endplate fractures that have occurred since chest CT 06/25/2017. There are older endplate fractures at W09 and T11. Electronically Signed   By: Monte Fantasia M.D.   On: 08/15/2017 12:00   Dg Chest Port 1 View  Result Date: 08/15/2017 CLINICAL DATA:  Hypoxia. EXAM: PORTABLE CHEST 1 VIEW COMPARISON:  Chest x-ray dated July 28, 2017. FINDINGS: The heart size and mediastinal contours are within normal limits. Slightly increased peripheral interstitial markings are more conspicuous when compared to prior study. Possible trace left pleural effusion. Low lung volumes with bibasilar atelectasis. No consolidation or pneumothorax. No acute osseous abnormality. IMPRESSION: New mild interstitial edema and possible trace left pleural effusion. Electronically Signed   By: Titus Dubin M.D.   On: 08/15/2017 10:54    Procedures Procedures (including critical care time)  CRITICAL CARE Performed by: Frederica Kuster   Total critical care time: 35 minutes  Critical care time was exclusive of separately billable procedures and treating other  patients.  Critical care was necessary to treat or prevent imminent or life-threatening deterioration.  Critical care was time spent personally by me on the following activities: development of treatment plan with patient and/or surrogate as well as nursing, discussions with consultants, evaluation of patient's response to treatment, examination of patient, obtaining history from patient or surrogate, ordering and performing treatments and interventions, ordering and review of laboratory studies, ordering and review of radiographic studies, pulse oximetry and re-evaluation of patient's condition.  Sepsis - Repeat Assessment  Performed at:    1330  Vitals     Blood pressure (!) 88/67, pulse (!) 111, temperature 97.7 F (36.5 C), temperature source Oral, resp. rate (!) 27, height _0  (1.575 m), weight 46.7 kg (103 lb), SpO2 100 %.  Heart:     Tachycardia  Lungs:    CTA  Capillary Refill:   <2 sec  Peripheral Pulse:   Radial pulse palpable  Skin:     Normal Color and Diaphoretic      Medications Ordered in ED Medications  potassium chloride 10 mEq in 100 mL IVPB (has no administration in time range)  magnesium sulfate IVPB 2 g 50 mL (has no administration in time range)  dextrose 5 % and 0.9 % NaCl with KCl 40 mEq/L infusion (has no administration in time range)  cefTRIAXone (ROCEPHIN) 1 g in sodium chloride 0.9 % 100 mL IVPB (has no administration in time range)  metroNIDAZOLE (FLAGYL) IVPB 500 mg (has no administration in time range)  HYDROmorphone (DILAUDID) injection 0.5 mg (has no administration in time range)  nicotine (NICODERM CQ - dosed in mg/24 hours) patch 14 mg (has no administration in time range)  iohexol (OMNIPAQUE) 300 MG/ML solution 100 mL (75 mLs Intravenous Contrast Given 08/15/17 1117)  sodium chloride 0.9 % bolus 500 mL (0 mLs Intravenous Stopped 08/15/17 1230)  cefTRIAXone (ROCEPHIN) 2 g in sodium chloride 0.9 % 100 mL IVPB (0 g Intravenous Stopped 08/15/17  1221)  metroNIDAZOLE (FLAGYL) IVPB 500 mg (0 mg Intravenous Stopped 08/15/17 1317)  fentaNYL (SUBLIMAZE) injection 25 mcg (25 mcg Intravenous Given 08/15/17 1223)     Initial Impression / Assessment and Plan / ED Course  I have reviewed the triage vital signs and the nursing notes.  Pertinent labs & imaging results that were available during my  care of the patient were reviewed by me and considered in my medical decision making (see chart for details).     Patient with septic shock related to intra-abdominal infection.  Patient with initial lactic 2.33.  Patient given weight-based fluids (1500cc) and Rocephin and Flagyl, per pharmacy, as patient has allergies to penicillins and fluoroquinolones.  WBC 35.5.  CMP shows potassium 2.7, BUN 37, creatinine 1.20, alk phos 194, total bilirubin 3.0, albumin 2.3. BNP 2427.4. Troponin negative.  Blood culture sent and pending.  CT abdomen pelvis shows: IMPRESSION: 1. 9 cm irregular cystic mass in the left paracolic gutter, favor abscess over cavitary neoplasm. No pneumoperitoneum or adjacent bowel perforation to explain this collection, suspect missed perforation that has subsequently sealed. It is noted that there was an abscess in this location in 2004 by CT. 2. Moderate ascites with loculation and peritonitis findings, presumably from the same. 3. Suspect cirrhosis. Consider SBP as cause of above. 4. Distal gastritis. 5. Mild mesenteric and upper retroperitoneal adenopathy presumably reactive to the above. Suggest EGD after convalescence. 6. T12, L1, and L2 endplate fractures that have occurred since chest CT 06/25/2017. There are older endplate fractures at R00 and T11.  I consulted general surgery and spoke with Claiborne Billings, [place], who will evaluate the patient and recommends medical admission.  I spoke with Dr. Wynelle Cleveland with [facility] who accepts admission.  I appreciate the above consultants for their assistance with the patient.  Patient also  evaluated by Dr. Sabra Heck who guided the patient's management and agrees with plan.  Final Clinical Impressions(s) / ED Diagnoses   Final diagnoses:  Intra-abdominal abscess ([place])  Left lower quadrant pain  Sepsis, due to unspecified organism [facility])    ED Discharge Orders    None       Frederica Kuster, PA-C 08/15/17 1414    Noemi Chapel, MD 08/15/17 [phone]

## 2017-08-15 NOTE — ED Triage Notes (Addendum)
To ED via [facility] from [facility] medicine in [employer], with c/o hypotension and abd pain. Pt's wbc = 46.5.  Pt has been losing weight- 44# in 6 weeks-- started lasix and spironalactone- 6 weeks-- sees dr bensimhon 2 weeks ago.  Pt is rehabbing at [facility] nursing facility

## 2017-08-15 NOTE — ED Notes (Signed)
Pt is clammy, has moist sounding cough. Lungs with rhonchi in lower lobes, JVD noted.

## 2017-08-15 NOTE — Sedation Documentation (Signed)
Patient BP 76/47, Dr. Archer Asa informed. No new orders obtained.

## 2017-08-15 NOTE — Sedation Documentation (Signed)
Patient being transported back to ED by this nurse. This nurse will give report to nurse in ED. Patient A&Ox4 at this time.

## 2017-08-16 ENCOUNTER — Inpatient Hospital Stay (HOSPITAL_COMMUNITY): Payer: Medicare Other

## 2017-08-16 DIAGNOSIS — A419 Sepsis, unspecified organism: Principal | ICD-10-CM

## 2017-08-16 LAB — PHOSPHORUS: Phosphorus: 3.8 mg/dL (ref 2.5–4.6)

## 2017-08-16 LAB — COMPREHENSIVE METABOLIC PANEL
ALBUMIN: 1.9 g/dL — AB (ref 3.5–5.0)
ALK PHOS: 150 U/L — AB (ref 38–126)
ALT: 12 U/L — ABNORMAL LOW (ref 14–54)
ANION GAP: 14 (ref 5–15)
AST: 32 U/L (ref 15–41)
BILIRUBIN TOTAL: 1.8 mg/dL — AB (ref 0.3–1.2)
BUN: 46 mg/dL — AB (ref 6–20)
CALCIUM: 9.1 mg/dL (ref 8.9–10.3)
CO2: 28 mmol/L (ref 22–32)
Chloride: 100 mmol/L — ABNORMAL LOW (ref 101–111)
Creatinine, Ser: 1.42 mg/dL — ABNORMAL HIGH (ref 0.44–1.00)
GFR calc Af Amer: 43 mL/min — ABNORMAL LOW (ref >60)
GFR, EST NON AFRICAN AMERICAN: 37 mL/min — AB (ref >60)
GLUCOSE: 45 mg/dL — AB (ref 65–99)
Potassium: 4.1 mmol/L (ref 3.5–5.1)
Sodium: 142 mmol/L (ref 135–145)
TOTAL PROTEIN: 5.9 g/dL — AB (ref 6.5–8.1)

## 2017-08-16 LAB — CBC
HCT: 34.2 % — ABNORMAL LOW (ref 36.0–46.0)
Hemoglobin: 11 g/dL — ABNORMAL LOW (ref 12.0–15.0)
MCH: 27 pg (ref 26.0–34.0)
MCHC: 32.2 g/dL (ref 30.0–36.0)
MCV: 84 fL (ref 78.0–100.0)
PLATELETS: 293 10*3/uL (ref 150–400)
RBC: 4.07 MIL/uL (ref 3.87–5.11)
RDW: 15.8 % — AB (ref 11.5–15.5)
WBC: 35 10*3/uL — AB (ref 4.0–10.5)

## 2017-08-16 LAB — GLUCOSE, CAPILLARY
Glucose-Capillary: 47 mg/dL — ABNORMAL LOW (ref 65–99)
Glucose-Capillary: 61 mg/dL — ABNORMAL LOW (ref 65–99)
Glucose-Capillary: 68 mg/dL (ref 65–99)
Glucose-Capillary: 131 mg/dL — ABNORMAL HIGH (ref 65–99)
Glucose-Capillary: 98 mg/dL (ref 65–99)

## 2017-08-16 LAB — LACTIC ACID, PLASMA
LACTIC ACID, VENOUS: 1.3 mmol/L (ref 0.5–1.9)
Lactic Acid, Venous: 1.2 mmol/L (ref 0.5–1.9)

## 2017-08-16 LAB — MRSA PCR SCREENING: MRSA by PCR: NEGATIVE

## 2017-08-16 LAB — MAGNESIUM: MAGNESIUM: 1.9 mg/dL (ref 1.7–2.4)

## 2017-08-16 MED ORDER — KCL IN DEXTROSE-NACL 20-5-0.45 MEQ/L-%-% IV SOLN
INTRAVENOUS | Status: DC
  Administered 2017-08-16 (×2): via INTRAVENOUS
  Filled 2017-08-16: qty 1000

## 2017-08-16 MED ORDER — DEXTROSE 50 % IV SOLN
INTRAVENOUS | Status: AC
  Administered 2017-08-16: 50 mL
  Filled 2017-08-16: qty 50

## 2017-08-16 MED ORDER — ALBUMIN HUMAN 5 % IV SOLN
25.0000 g | Freq: Four times a day (QID) | INTRAVENOUS | Status: AC
  Administered 2017-08-16 (×2): 25 g via INTRAVENOUS
  Filled 2017-08-16 (×2): qty 500

## 2017-08-16 MED ORDER — SODIUM CHLORIDE 0.9 % IV SOLN
1.0000 g | Freq: Two times a day (BID) | INTRAVENOUS | Status: DC
  Administered 2017-08-16 – 2017-08-17 (×3): 1 g via INTRAVENOUS
  Filled 2017-08-16 (×4): qty 1

## 2017-08-16 MED ORDER — ENOXAPARIN SODIUM 30 MG/0.3ML ~~LOC~~ SOLN
30.0000 mg | SUBCUTANEOUS | Status: DC
  Administered 2017-08-16 – 2017-08-28 (×13): 30 mg via SUBCUTANEOUS
  Filled 2017-08-16 (×13): qty 0.3

## 2017-08-16 MED ORDER — SODIUM CHLORIDE 0.9 % IV BOLUS
250.0000 mL | Freq: Once | INTRAVENOUS | Status: AC
  Administered 2017-08-16: 250 mL via INTRAVENOUS

## 2017-08-16 MED ORDER — DEXTROSE 50 % IV SOLN
INTRAVENOUS | Status: AC
  Administered 2017-08-16: 25 mL
  Filled 2017-08-16: qty 50

## 2017-08-16 MED ORDER — VANCOMYCIN HCL 500 MG IV SOLR
500.0000 mg | INTRAVENOUS | Status: DC
  Administered 2017-08-17 – 2017-08-18 (×2): 500 mg via INTRAVENOUS
  Filled 2017-08-16 (×3): qty 500

## 2017-08-16 MED ORDER — SODIUM CHLORIDE 0.9 % IV BOLUS
1000.0000 mL | Freq: Once | INTRAVENOUS | Status: AC
  Administered 2017-08-16: 1000 mL via INTRAVENOUS

## 2017-08-16 MED ORDER — KCL IN DEXTROSE-NACL 20-5-0.45 MEQ/L-%-% IV SOLN: INTRAVENOUS | Status: DC

## 2017-08-16 MED ORDER — ALBUMIN HUMAN 5 % IV SOLN
12.5000 g | Freq: Once | INTRAVENOUS | Status: AC
  Administered 2017-08-16: 12.5 g via INTRAVENOUS
  Filled 2017-08-16: qty 250

## 2017-08-16 MED ORDER — DEXTROSE 5 % IV SOLN: INTRAVENOUS | Status: DC

## 2017-08-16 MED ORDER — VANCOMYCIN HCL IN DEXTROSE 1-5 GM/200ML-% IV SOLN
1000.0000 mg | Freq: Once | INTRAVENOUS | Status: AC
  Administered 2017-08-16: 1000 mg via INTRAVENOUS
  Filled 2017-08-16: qty 200

## 2017-08-16 NOTE — Progress Notes (Signed)
Referring Physician(s): Dr Gwyndolyn Kaufman  Supervising Physician: Gilmer Mor  Patient Status:  [facility] - In-pt  Chief Complaint:  LLQ abscess drain placement  Subjective:  Not well Painful abd Hypotensive Surgeon has seen pt this am   Allergies: Ciprofloxacin; Levofloxacin; Nickel; and Penicillins  Medications: Prior to Admission medications   Medication Sig Start Date End Date Taking? Authorizing Provider  acetaminophen (TYLENOL) 500 MG tablet Take 1,000 mg by mouth as needed for mild pain.   Yes [provider]  folic acid (FOLVITE) 1 MG tablet Take 1 tablet (1 mg total) by mouth daily. 06/26/17  Yes Burnadette Pop, MD  furosemide (LASIX) 40 MG tablet Take 1 tablet (40 mg total) by mouth daily. 06/27/17  Yes Burnadette Pop, MD  naproxen sodium (ALEVE) 220 MG tablet Take 440 mg by mouth daily as needed (pain).   Yes [provider]  Potassium Chloride ER 20 MEQ TBCR Take 20 mEq by mouth daily. 06/26/17  Yes Burnadette Pop, MD  spironolactone (ALDACTONE) 25 MG tablet Take 0.5 tablets (12.5 mg total) by mouth daily. 07/30/17 10/28/17 Yes Bensimhon, Bevelyn Buckles, MD  thiamine 100 MG tablet Take 1 tablet (100 mg total) by mouth daily. Patient not taking: Reported on 07/30/2017 06/26/17   Burnadette Pop, MD     Vital Signs: BP (!) 87/54   Pulse (!) 107   Temp 98.8 F (37.1 C)   Resp 17   Ht  (1.575 m)   Wt 103 lb (46.7 kg)   SpO2 97%   BMI 18.84 kg/m   Physical Exam  Abdominal: Bowel sounds are decreased. There is tenderness.  Skin: Skin is warm and dry.  Site of drain is clean and dry Sl tender OP bloody 65 cc yesterday 10 cc in JP   Nursing note and vitals reviewed.   Imaging: Ct Abdomen Pelvis W Contrast  Result Date: 08/15/2017 CLINICAL DATA:  Generalized abdominal pain with hypertension EXAM: CT ABDOMEN AND PELVIS WITH CONTRAST TECHNIQUE: Multidetector CT imaging of the abdomen and pelvis was performed using the standard protocol following  bolus administration of intravenous contrast. CONTRAST:  75mL OMNIPAQUE IOHEXOL 300 MG/ML  SOLN COMPARISON:  04/07/2003 FINDINGS: Lower chest: Large right heart. Atherosclerotic calcification. No acute finding Hepatobiliary: Questionable lobulation of the liver surface. The caudate lobe is also large.No evidence of biliary obstruction or stone. Pancreas: Generalized atrophy. Spleen: Negative.  No definite enlargement. Adrenals/Urinary Tract: Negative adrenals. No hydronephrosis or stone. Unremarkable bladder. Stomach/Bowel: Evidence of prior sigmoidectomy. There is extensive colonic diverticulosis. Suspect appendectomy. The distal gastric wall is thickened by submucosal low-density. No discrete ulcer or mass is seen. Vascular/Lymphatic: Extensive atherosclerotic plaque on the aorta and visceral branches. Prominent mesenteric and upper retroperitoneal lymph nodes which may be related to the above. Reproductive:IUD in the atrophic uterus. Other: Overall moderate ascites mainly seen about the liver and in the pelvis where there is signs of loculation and peritoneal thickening/enhancement. Areas of greatest peritoneal thickening seen below the liver, in the ventral left upper quadrant, and along the left pelvic sidewall. There is also a multilobulated thick walled cystic collection in the left paracolic gutter measuring up to 9 x 8.4 cm. Regional inflammation is mild. An abscess was seen in this location in 2004. Musculoskeletal: T10 and T11 vertebral body fractures that were seen on prior CT. There is interval T12 inferior endplate, L1 superior endplate, and L2 superior endplate fractures with mild depression. No retropulsion. These results were called by telephone at the time of  interpretation on 08/15/2017 at 11:48 am to Dr. Buel Ream , who verbally acknowledged these results. IMPRESSION: 1. 9 cm irregular cystic mass in the left paracolic gutter, favor abscess over cavitary neoplasm. No pneumoperitoneum or  adjacent bowel perforation to explain this collection, suspect missed perforation that has subsequently sealed. It is noted that there was an abscess in this location in 2004 by CT. 2. Moderate ascites with loculation and peritonitis findings, presumably from the same. 3. Suspect cirrhosis.  Consider SBP as cause of above. 4. Distal gastritis. 5. Mild mesenteric and upper retroperitoneal adenopathy presumably reactive to the above. Suggest EGD after convalescence. 6. T12, L1, and L2 endplate fractures that have occurred since chest CT 06/25/2017. There are older endplate fractures at T10 and T11. Electronically Signed   By: Marnee Spring M.D.   On: 08/15/2017 12:00   Dg Chest Port 1 View  Result Date: 08/15/2017 CLINICAL DATA:  Hypoxia. EXAM: PORTABLE CHEST 1 VIEW COMPARISON:  Chest x-ray dated July 28, 2017. FINDINGS: The heart size and mediastinal contours are within normal limits. Slightly increased peripheral interstitial markings are more conspicuous when compared to prior study. Possible trace left pleural effusion. Low lung volumes with bibasilar atelectasis. No consolidation or pneumothorax. No acute osseous abnormality. IMPRESSION: New mild interstitial edema and possible trace left pleural effusion. Electronically Signed   By: Obie Dredge M.D.   On: 08/15/2017 10:54   Ir Image Guided Drainage Percut Cath  Peritoneal Retroperit  Result Date: 08/15/2017 INDICATION: 67 year old female with a history of prior colonic perforation and colon resection currently septic with evidence of a large abscess cavity in the left lower quadrant as well as ascites and evidence of peritoneal enhancement. She presents for percutaneous drain placement and paracentesis. EXAM: IR IMAGE GUIDED DRAINAGE PERCUT CATH PERITONEAL RETROPERIT; IR PARACENTESIS MEDICATIONS: The patient is currently admitted to the hospital and receiving intravenous antibiotics. The antibiotics were administered within an appropriate time  frame prior to the initiation of the procedure. ANESTHESIA/SEDATION: Fentanyl 25 mcg IV; Versed 0.5 mg IV Moderate Sedation Time:  17 minutes The patient was continuously monitored during the procedure by the interventional radiology nurse under my direct supervision. COMPLICATIONS: None immediate. PROCEDURE: Informed written consent was obtained from the patient after a thorough discussion of the procedural risks, benefits and alternatives. All questions were addressed. Maximal Sterile Barrier Technique was utilized including caps, mask, sterile gowns, sterile gloves, sterile drape, hand hygiene and skin antiseptic. A timeout was performed prior to the initiation of the procedure. Ultrasound was used to interrogate the left upper quadrant. Perihepatic ascites is evident. Local anesthesia was attained by infiltration with 1% lidocaine. Under real-time sonographic guidance, a 17 gauge 7 cm Yueh centesis catheter was carefully advanced into the fluid collection. An image was obtained and stored for the medical record. Paracentesis was then performed yielding approximately 400 mL of cloudy ascitic fluid. A sample was sent for culture. Attention was next turned to the left flank. Ultrasound demonstrates a large complex fluid collection affiliated with the left lower quadrant abdominal wall. Local anesthesia was again attained by infiltration with 1% lidocaine. A small dermatotomy was made. Under real-time sonographic guidance, a 10 French drainage catheter was advanced into the complex fluid collection using trocar technique. The catheter was advanced off the trocar and the locking pigtail loop was formed. Aspiration yields approximately 200 mL thick purulent fluid. The abscess cavity was flushed and the drainage catheter connected to JP bulb suction. The drain was then secured to the skin  with 0 Prolene suture. IMPRESSION: 1. Paracentesis yields approximately 400 mL cloudy ascitic fluid highly concerning for bacterial  peritonitis. 2. Percutaneous 12 French drain placement yielding approximately 200 mL of thick purulent fluid. Of note, the patient has a very thin body habitus in the drainage catheter is just deep to the skin surface. Given the superficial nature of the drain, inadvertent displacement/removal is a concern. Recommend extreme care when changing bandages. Signed, Sterling Big, MD [facility] Radiology Electronically Signed   By: Malachy Moan M.D.   On: 08/15/2017 17:23   Ir Paracentesis  Result Date: 08/15/2017 INDICATION: 67 year old female with a history of prior colonic perforation and colon resection currently septic with evidence of a large abscess cavity in the left lower quadrant as well as ascites and evidence of peritoneal enhancement. She presents for percutaneous drain placement and paracentesis. EXAM: IR IMAGE GUIDED DRAINAGE PERCUT CATH PERITONEAL RETROPERIT; IR PARACENTESIS MEDICATIONS: The patient is currently admitted to the hospital and receiving intravenous antibiotics. The antibiotics were administered within an appropriate time frame prior to the initiation of the procedure. ANESTHESIA/SEDATION: Fentanyl 25 mcg IV; Versed 0.5 mg IV Moderate Sedation Time:  17 minutes The patient was continuously monitored during the procedure by the interventional radiology nurse under my direct supervision. COMPLICATIONS: None immediate. PROCEDURE: Informed written consent was obtained from the patient after a thorough discussion of the procedural risks, benefits and alternatives. All questions were addressed. Maximal Sterile Barrier Technique was utilized including caps, mask, sterile gowns, sterile gloves, sterile drape, hand hygiene and skin antiseptic. A timeout was performed prior to the initiation of the procedure. Ultrasound was used to interrogate the left upper quadrant. Perihepatic ascites is evident. Local anesthesia was attained by  infiltration with 1% lidocaine. Under real-time sonographic guidance, a 17 gauge 7 cm Yueh centesis catheter was carefully advanced into the fluid collection. An image was obtained and stored for the medical record. Paracentesis was then performed yielding approximately 400 mL of cloudy ascitic fluid. A sample was sent for culture. Attention was next turned to the left flank. Ultrasound demonstrates a large complex fluid collection affiliated with the left lower quadrant abdominal wall. Local anesthesia was again attained by infiltration with 1% lidocaine. A small dermatotomy was made. Under real-time sonographic guidance, a 10 French drainage catheter was advanced into the complex fluid collection using trocar technique. The catheter was advanced off the trocar and the locking pigtail loop was formed. Aspiration yields approximately 200 mL thick purulent fluid. The abscess cavity was flushed and the drainage catheter connected to JP bulb suction. The drain was then secured to the skin with 0 Prolene suture. IMPRESSION: 1. Paracentesis yields approximately 400 mL cloudy ascitic fluid highly concerning for bacterial peritonitis. 2. Percutaneous 12 French drain placement yielding approximately 200 mL of thick purulent fluid. Of note, the patient has a very thin body habitus in the drainage catheter is just deep to the skin surface. Given the superficial nature of the drain, inadvertent displacement/removal is a concern. Recommend extreme care when changing bandages. Signed, Sterling Big, MD [facility] Radiology Electronically Signed   By: Malachy Moan M.D.   On: 08/15/2017 17:23    Labs:  CBC: Recent Labs    06/24/17 2049 08/15/17 1002 08/15/17 1032 08/15/17 1938 08/16/17 0452  WBC 7.0 35.5*  --  38.3* 35.0*  HGB 12.1 13.1 13.9 11.2* 11.0*  HCT 36.8 38.3 41.0 34.5* 34.2*  PLT 243 303  --  287 293    COAGS: Recent Labs    08/15/17 1938    INR 1.65    BMP: Recent Labs    06/25/17 0428 06/26/17 0500 08/15/17 1002 08/15/17 1032 08/16/17 0452  NA 140 142 141 139 142  K 3.6 3.1* 2.7* 3.0* 4.1  CL 105 103 94* 95* 100*  CO2 --  28  GLUCOSE 93 118* 67 78 45*  BUN 19 14 37* 42* 46*  CALCIUM 8.8* 8.4* 9.2  --  9.1  CREATININE 0.54 0.49 1.20* 1.20* 1.42*  GFRNONAA >60 >60 45*  --  37*  GFRAA >60 >60 52*  --  43*    LIVER FUNCTION TESTS: Recent Labs    06/24/17 2049 08/15/17 1002 08/16/17 0452  BILITOT 0.7 3.0* 1.8*  AST 43* 31 32  ALT 15 13* 12*  ALKPHOS 181* 194* 150*  PROT 7.0 7.2 5.9*  ALBUMIN 2.9* 2.3* 1.9*    Assessment and Plan:  Abd abscess Drain intact Para in procedure was milky tan Will follow  Electronically Signed: Negin Hegg A, PA-C 08/16/2017, 12:16 PM   I spent a total of 15 Minutes at the the patient's bedside AND on the patient's hospital floor or unit, greater than 50% of which was counseling/coordinating care for abs drain

## 2017-08-16 NOTE — Progress Notes (Addendum)
Late entry Hypoglycemic Event  CBG: 47  Treatment: 15 GM carbohydrate snack  Symptoms: Pale and Sweaty  Follow-up CBG: Time:0746 CBG Result:61  Given another juice Follow-up CBG: Time:0832    CBG Result: 68 Given D50 Follow-up CBG 0954 Result:131  Possible Reasons for Event: Inadequate meal intake  Comments/MD notified:Dr. Allena Katz aware    Rachel Williams Consuella Lose

## 2017-08-16 NOTE — Progress Notes (Signed)
Subjective/Chief Complaint: Afebrile, Mildly tachycardic Minimal UOP - increasing Cr Paracentesis and perc abscess drain yesterday  Objective: Vital signs in last 24 hours: Temp:  [97.7 F (36.5 C)-98.5 F (36.9 C)] 98.1 F (36.7 C) (05/11 0311) Pulse Rate:  [110-130] 113 (05/11 0435) Resp:  [19-28] 19 (05/11 0435) BP: (68-106)/(46-73) 94/65 (05/11 0435) SpO2:  [88 %-100 %] 97 % (05/11 0435) Weight:  [46.7 kg (103 lb)] 46.7 kg (103 lb) (05/10 1846) Last BM Date: 08/14/17  Intake/Output from previous day: 05/10 0701 - 05/11 0700 In: 2107.6 [I.V.:1000; IV Piggyback:1107.6] Out: 65 [Drains:65] Intake/Output this shift: No intake/output data recorded.  Cachectic appearing female - uncomfortable Abd - mildly distended; tender across bilateral lower abdomen Drain - minimal purulent and serosanguinous drainage  Lab Results:  Recent Labs    08/15/17 1938 08/16/17 0452  WBC 38.3* 35.0*  HGB 11.2* 11.0*  HCT 34.5* 34.2*  PLT 287 293   BMET Recent Labs    08/15/17 1002 08/15/17 1032 08/16/17 0452  NA 141 139 142  K 2.7* 3.0* 4.1  CL 94* 95* 100*  CO2 30  --  28  GLUCOSE 67 78 45*  BUN 37* 42* 46*  CREATININE 1.20* 1.20* 1.42*  CALCIUM 9.2  --  9.1   PT/INR Recent Labs    08/15/17 1938  LABPROT 19.3*  INR 1.65   ABG No results for input(s): PHART, HCO3 in the last 72 hours.  Invalid input(s): PCO2, PO2  Studies/Results: Ct Abdomen Pelvis W Contrast  Result Date: 08/15/2017 CLINICAL DATA:  Generalized abdominal pain with hypertension EXAM: CT ABDOMEN AND PELVIS WITH CONTRAST TECHNIQUE: Multidetector CT imaging of the abdomen and pelvis was performed using the standard protocol following bolus administration of intravenous contrast. CONTRAST:  75mL OMNIPAQUE IOHEXOL 300 MG/ML  SOLN COMPARISON:  04/07/2003 FINDINGS: Lower chest: Large right heart. Atherosclerotic calcification. No acute finding Hepatobiliary: Questionable lobulation of the liver surface.  The caudate lobe is also large.No evidence of biliary obstruction or stone. Pancreas: Generalized atrophy. Spleen: Negative.  No definite enlargement. Adrenals/Urinary Tract: Negative adrenals. No hydronephrosis or stone. Unremarkable bladder. Stomach/Bowel: Evidence of prior sigmoidectomy. There is extensive colonic diverticulosis. Suspect appendectomy. The distal gastric wall is thickened by submucosal low-density. No discrete ulcer or mass is seen. Vascular/Lymphatic: Extensive atherosclerotic plaque on the aorta and visceral branches. Prominent mesenteric and upper retroperitoneal lymph nodes which may be related to the above. Reproductive:IUD in the atrophic uterus. Other: Overall moderate ascites mainly seen about the liver and in the pelvis where there is signs of loculation and peritoneal thickening/enhancement. Areas of greatest peritoneal thickening seen below the liver, in the ventral left upper quadrant, and along the left pelvic sidewall. There is also a multilobulated thick walled cystic collection in the left paracolic gutter measuring up to 9 x 8.4 cm. Regional inflammation is mild. An abscess was seen in this location in 2004. Musculoskeletal: T10 and T11 vertebral body fractures that were seen on prior CT. There is interval T12 inferior endplate, L1 superior endplate, and L2 superior endplate fractures with mild depression. No retropulsion. These results were called by telephone at the time of interpretation on 08/15/2017 at 11:48 am to Dr. Buel Ream , who verbally acknowledged these results. IMPRESSION: 1. 9 cm irregular cystic mass in the left paracolic gutter, favor abscess over cavitary neoplasm. No pneumoperitoneum or adjacent bowel perforation to explain this collection, suspect missed perforation that has subsequently sealed. It is noted that there was an abscess in this location in 2004  by CT. 2. Moderate ascites with loculation and peritonitis findings, presumably from the same. 3.  Suspect cirrhosis.  Consider SBP as cause of above. 4. Distal gastritis. 5. Mild mesenteric and upper retroperitoneal adenopathy presumably reactive to the above. Suggest EGD after convalescence. 6. T12, L1, and L2 endplate fractures that have occurred since chest CT 06/25/2017. There are older endplate fractures at T10 and T11. Electronically Signed   By: Marnee Spring M.D.   On: 08/15/2017 12:00   Dg Chest Port 1 View  Result Date: 08/15/2017 CLINICAL DATA:  Hypoxia. EXAM: PORTABLE CHEST 1 VIEW COMPARISON:  Chest x-ray dated July 28, 2017. FINDINGS: The heart size and mediastinal contours are within normal limits. Slightly increased peripheral interstitial markings are more conspicuous when compared to prior study. Possible trace left pleural effusion. Low lung volumes with bibasilar atelectasis. No consolidation or pneumothorax. No acute osseous abnormality. IMPRESSION: New mild interstitial edema and possible trace left pleural effusion. Electronically Signed   By: Obie Dredge M.D.   On: 08/15/2017 10:54   Ir Image Guided Drainage Percut Cath  Peritoneal Retroperit  Result Date: 08/15/2017 INDICATION: 67 year old female with a history of prior colonic perforation and colon resection currently septic with evidence of a large abscess cavity in the left lower quadrant as well as ascites and evidence of peritoneal enhancement. She presents for percutaneous drain placement and paracentesis. EXAM: IR IMAGE GUIDED DRAINAGE PERCUT CATH PERITONEAL RETROPERIT; IR PARACENTESIS MEDICATIONS: The patient is currently admitted to the hospital and receiving intravenous antibiotics. The antibiotics were administered within an appropriate time frame prior to the initiation of the procedure. ANESTHESIA/SEDATION: Fentanyl 25 mcg IV; Versed 0.5 mg IV Moderate Sedation Time:  17 minutes The patient was continuously monitored during the procedure by the interventional radiology nurse under my direct supervision.  COMPLICATIONS: None immediate. PROCEDURE: Informed written consent was obtained from the patient after a thorough discussion of the procedural risks, benefits and alternatives. All questions were addressed. Maximal Sterile Barrier Technique was utilized including caps, mask, sterile gowns, sterile gloves, sterile drape, hand hygiene and skin antiseptic. A timeout was performed prior to the initiation of the procedure. Ultrasound was used to interrogate the left upper quadrant. Perihepatic ascites is evident. Local anesthesia was attained by infiltration with 1% lidocaine. Under real-time sonographic guidance, a 17 gauge 7 cm Yueh centesis catheter was carefully advanced into the fluid collection. An image was obtained and stored for the medical record. Paracentesis was then performed yielding approximately 400 mL of cloudy ascitic fluid. A sample was sent for culture. Attention was next turned to the left flank. Ultrasound demonstrates a large complex fluid collection affiliated with the left lower quadrant abdominal wall. Local anesthesia was again attained by infiltration with 1% lidocaine. A small dermatotomy was made. Under real-time sonographic guidance, a 10 French drainage catheter was advanced into the complex fluid collection using trocar technique. The catheter was advanced off the trocar and the locking pigtail loop was formed. Aspiration yields approximately 200 mL thick purulent fluid. The abscess cavity was flushed and the drainage catheter connected to JP bulb suction. The drain was then secured to the skin with 0 Prolene suture. IMPRESSION: 1. Paracentesis yields approximately 400 mL cloudy ascitic fluid highly concerning for bacterial peritonitis. 2. Percutaneous 12 French drain placement yielding approximately 200 mL of thick purulent fluid. Of note, the patient has a very thin body habitus in the drainage catheter is just deep to the skin surface. Given the superficial nature of the drain,  inadvertent  displacement/removal is a concern. Recommend extreme care when changing bandages. Signed, Sterling Big, MD [facility] Electronically Signed   By: Malachy Moan M.D.   On: 08/15/2017 17:23   Ir Paracentesis  Result Date: 08/15/2017 INDICATION: 67 year old female with a history of prior colonic perforation and colon resection currently septic with evidence of a large abscess cavity in the left lower quadrant as well as ascites and evidence of peritoneal enhancement. She presents for percutaneous drain placement and paracentesis. EXAM: IR IMAGE GUIDED DRAINAGE PERCUT CATH PERITONEAL RETROPERIT; IR PARACENTESIS MEDICATIONS: The patient is currently admitted to the hospital and receiving intravenous antibiotics. The antibiotics were administered within an appropriate time frame prior to the initiation of the procedure. ANESTHESIA/SEDATION: Fentanyl 25 mcg IV; Versed 0.5 mg IV Moderate Sedation Time:  17 minutes The patient was continuously monitored during the procedure by the interventional radiology nurse under my direct supervision. COMPLICATIONS: None immediate. PROCEDURE: Informed written consent was obtained from the patient after a thorough discussion of the procedural risks, benefits and alternatives. All questions were addressed. Maximal Sterile Barrier Technique was utilized including caps, mask, sterile gowns, sterile gloves, sterile drape, hand hygiene and skin antiseptic. A timeout was performed prior to the initiation of the procedure. Ultrasound was used to interrogate the left upper quadrant. Perihepatic ascites is evident. Local anesthesia was attained by infiltration with 1% lidocaine. Under real-time sonographic guidance, a 17 gauge 7 cm Yueh centesis catheter was carefully advanced into the fluid collection. An image was obtained and stored for the medical record. Paracentesis was then performed yielding  approximately 400 mL of cloudy ascitic fluid. A sample was sent for culture. Attention was next turned to the left flank. Ultrasound demonstrates a large complex fluid collection affiliated with the left lower quadrant abdominal wall. Local anesthesia was again attained by infiltration with 1% lidocaine. A small dermatotomy was made. Under real-time sonographic guidance, a 10 French drainage catheter was advanced into the complex fluid collection using trocar technique. The catheter was advanced off the trocar and the locking pigtail loop was formed. Aspiration yields approximately 200 mL thick purulent fluid. The abscess cavity was flushed and the drainage catheter connected to JP bulb suction. The drain was then secured to the skin with 0 Prolene suture. IMPRESSION: 1. Paracentesis yields approximately 400 mL cloudy ascitic fluid highly concerning for bacterial peritonitis. 2. Percutaneous 12 French drain placement yielding approximately 200 mL of thick purulent fluid. Of note, the patient has a very thin body habitus in the drainage catheter is just deep to the skin surface. Given the superficial nature of the drain, inadvertent displacement/removal is a concern. Recommend extreme care when changing bandages. Signed, Sterling Big, MD [facility] Radiology Electronically Signed   By: Malachy Moan M.D.   On: 08/15/2017 17:23    Anti-infectives: Anti-infectives (From admission, onward)   Start     Dose/Rate Route Frequency Ordered Stop   08/17/17 0900  vancomycin (VANCOCIN) 500 mg in sodium chloride 0.9 % 100 mL IVPB     500 mg 100 mL/hr over 60 Minutes Intravenous Every 24 hours 08/16/17 0816     08/16/17 1200  cefTRIAXone (ROCEPHIN) 1 g in sodium chloride 0.9 % 100 mL IVPB  Status:  Discontinued     1 g 200 mL/hr over 30 Minutes Intravenous Every 24 hours 08/15/17 1351 08/16/17 0756   08/16/17 0830  vancomycin (VANCOCIN) IVPB 1000 mg/200 mL  premix  1,000 mg 200 mL/hr over 60 Minutes Intravenous  Once 08/16/17 0816     08/16/17 0830  meropenem (MERREM) 1 g in sodium chloride 0.9 % 100 mL IVPB     1 g 200 mL/hr over 30 Minutes Intravenous Every 12 hours 08/16/17 0816     08/15/17 2200  metroNIDAZOLE (FLAGYL) IVPB 500 mg  Status:  Discontinued     500 mg 100 mL/hr over 60 Minutes Intravenous Every 8 hours 08/15/17 1351 08/16/17 0757   08/15/17 1115  cefTRIAXone (ROCEPHIN) 2 g in sodium chloride 0.9 % 100 mL IVPB     2 g 200 mL/hr over 30 Minutes Intravenous  Once 08/15/17 1113 08/15/17 1221   08/15/17 1115  metroNIDAZOLE (FLAGYL) IVPB 500 mg     500 mg 100 mL/hr over 60 Minutes Intravenous  Once 08/15/17 1113 08/15/17 1317      Assessment/Plan: Septic shock secondary to Intra-abdominal abscess The etiology of this abscess is not known, but felt mostly likely to be secondary to some type of possible colonic perforation that has since sealed itself back off that subsequently developed an abscess.  She has some ascites in her abdomen as well on her scan, which also appears to have been somewhat present on her Chest CT in March of this year as well.  She has some concerning findings for possible liver cirrhosis that may be contributing to this as well.  She could have ascites from her CHF as well.  She has inflammation of her peritoneal lining on her CT scan as well.  She is tender throughout her abdomen c/w these findings as well as sepsis.  Because of her comorbidities, including cirrhosis, as well as previous surgeries, she has significant risk for complications with an intra-abdominal operation.  Because of this, we would like to try the less invasive approach first with a percutaneous drain knowing full and well that if this fails she will need a laparotomy with possible colostomy.  Agree with abx therapy.  Paracentesis of 400 ml of cloudy fluid and placement of abscess drain yesterday.  WBC slightly decreased. Keep NPO for now.   If her condition worsens, may still need exploratory laparotomy despite high risk.  Cirrhosis Family and patient were unaware of this diagnosis.  This was not mentioned to them despite being noted on her previous CT of her chest in March.  We are currently awaiting her coags to be able to further calculate what class of cirrhosis she may have.  In regards to her acute problem, this diagnosis increases her morbidity/mortality with any type of intra-abdominal operation; however, she and her family are aware that if she does not improve with conservative measures, that she may require an operation.  Diastolic CHF/pulmonary HTN/tricuspid regurg - BNP 2,400 Hypokalemia - 2.7, defer to medicine for replacement COPD, tobacco abuse H/o ETOH abuse, still drinks 2 glasses of wine daily H/o compression fractures Malnutrition    LOS: 1 day    Wynona Luna 08/16/2017

## 2017-08-16 NOTE — Progress Notes (Signed)
Pt BP 68/46, MAP 54, Pulse 115. Pt clammy and uncomfortable, states her pain level is 4/10 one hour after receiving PRN dilaudid. Paged Bodenheimer, NP. Will continue to monitor and await new orders.

## 2017-08-16 NOTE — Progress Notes (Signed)
Hypoglycemic Event  CBG: 60  Treatment: D50 IV 25 mL  Symptoms: None  Follow-up CBG: Time  2108 CBG Result: 140  Possible Reasons for Event: Vomiting  Comments/MD notified:     Lisette Grinder Quame Spratlin

## 2017-08-16 NOTE — Progress Notes (Signed)
Hypoglycemic Event  CBG: 47   Treatment: 15 GM carbohydrate snack  Pt given apple juice.   Symptoms: None No symptoms that patient is aware of. Noted to have clammy skin since arrival to hospital.  Follow-up CBG: Time:0747 CBG Result:61  Possible Reasons for Event: Inadequate meal intake Pt is on clear liquid diet and prefers diet drinks. Has not had any food since arrival to hospital. Given orange juice for follow up result. Will recheck.  Comments/MD notified: Bodenheimer, NP notified.     Renford Dills, RN

## 2017-08-16 NOTE — Progress Notes (Addendum)
[facility] Progress Note  Patient: Rachel Williams ZOX:[id number]   PCP: Sigmund Hazel, MD DOB: September 14, 1950   DOA: 08/15/2017   DOS: 08/16/2017   Date of Service: the patient was seen and examined on 08/16/2017  Subjective: Patient earlier in the morning was lethargic, after receiving dextrose became more awake and was feeling better including her pain was well controlled.  Later in the afternoon started complaining of cough and shortness of breath and again become lethargic.  No nausea no vomiting.  Passing gas but no BM.  Brief hospital course: Pt. with PMH of chronic diastolic CHF, pulmonary hypertension, moderate to severe TR, active smoker, history of alcohol abuse, history of bowel perforation with colectomy; admitted on 08/15/2017, presented with complaint of abdominal pain, was found to have intra-abdominal abscess with sepsis. Currently further plan is continue current treatment.  Assessment and Plan: 1.  Sepsis with septic shock. Intra-abdominal abscess. Possible perforated diverticulitis. Presented with abdominal pain, CT scan of the abdomen showing moderate ascites with 9 cm irregular cystic mass in the left paracolic gutter abscess or cavitary neoplasm. No pneumoperitoneum. General surgery consulted recommend conservative management with IR guided drainage as well as drain placement. Drain placement performed on 08/15/2017. Paracentesis performed on 08/15/2017. Culture from the drain is growing gram-positive cocci. Blood cultures so far negative. Patient received aggressive IV hydration and become short of breath and required Lasix last night. Patient was started on IV ceftriaxone and Flagyl, for aggressive coverage in a patient with severe sepsis we will transition to IV meropenem. To cover her for possible gram-positive cocci-enterococcus will add vancomycin.  MRSA PCR is negative. At present remains hypotensive after receiving another liter bolus as well as albumin  infusion. Critical care consulted for further assistance as I feel patient may require pressors  and NIV.  2.  Acute on chronic diastolic dysfunction. Severe pulmonary hypertension. Moderate to severe tricuspid regurgitation. Chest x-ray showed mild pulmonary edema and patient required 2 L of oxygen. Now appears in respiratory distress feels congested. Received IV Lasix last night after receiving IV hydration. At present patient does have vascular congestion but also remains intravascularly depleted. Lactic acid, check chest x-ray. Critical care consulted, suspect patient will require BiPAP support.  3.  Acute kidney injury. Renal function worsened.  Urine output minimal. Check bladder scan. Monitor in and out.  Addendum: Acute urinary retention. Bladder Scan shows 360 cc patient is unable to sense or urinate Insert Foley catheter.  Lynden Oxford 4:33 PM 08/16/2017    4.  Alcohol abuse. Possible cirrhosis based on the imaging. MELD score is 16, child class is class B. This assessment will likely change as the patient's primary issue of sepsis resolves anticipate improvement in patient's bilirubin level as well as creatinine level. No evidence of withdrawal for now.  Monitor.  5.  Compression fracture. Back pain. Recently hospitalized for the same. Requested by orthopedic surgery. Currently stable. Monitor.   6.  Active smoker. Nicotine patch provided. Patient was counseled for quit smoking.  7.  Hypokalemia. Replaced with IV. Monitor.  8.  Hypoglycemia. Due to poor p.o. intake. Replace with IV dextrose. Monitor.  9.  Sinus tachycardia. Due to sepsis. Monitor.  10.  Hypoalbuminemia. Due to cirrhosis. Also due to poor p.o. intake. Patient received IV albumin due to poor response to IV fluid resuscitation. We will monitor recommendation from critical care.  Diet: N.p.o. at present DVT Prophylaxis: subcutaneous Heparin  Advance goals of care discussion: full  code  Family Communication: family  was present at bedside, at the time of interview. The pt provided permission to discuss medical plan with the family. Opportunity was given to ask question and all questions were answered satisfactorily.   Disposition:  Discharge to be determined.  Consultants: General surgery PCCM IR Procedures: IR guided drain placement. Ultrasound-guided paracentesis.  Antibiotics: Anti-infectives (From admission, onward)   Start     Dose/Rate Route Frequency Ordered Stop   08/17/17 0900  vancomycin (VANCOCIN) 500 mg in sodium chloride 0.9 % 100 mL IVPB     500 mg 100 mL/hr over 60 Minutes Intravenous Every 24 hours 08/16/17 0816     08/16/17 1200  cefTRIAXone (ROCEPHIN) 1 g in sodium chloride 0.9 % 100 mL IVPB  Status:  Discontinued     1 g 200 mL/hr over 30 Minutes Intravenous Every 24 hours 08/15/17 1351 08/16/17 0756   08/16/17 0830  vancomycin (VANCOCIN) IVPB 1000 mg/200 mL premix     1,000 mg 200 mL/hr over 60 Minutes Intravenous  Once 08/16/17 0816 08/16/17 1117   08/16/17 0830  meropenem (MERREM) 1 g in sodium chloride 0.9 % 100 mL IVPB     1 g 200 mL/hr over 30 Minutes Intravenous Every 12 hours 08/16/17 0816     08/15/17 2200  metroNIDAZOLE (FLAGYL) IVPB 500 mg  Status:  Discontinued     500 mg 100 mL/hr over 60 Minutes Intravenous Every 8 hours 08/15/17 1351 08/16/17 0757   08/15/17 1115  cefTRIAXone (ROCEPHIN) 2 g in sodium chloride 0.9 % 100 mL IVPB     2 g 200 mL/hr over 30 Minutes Intravenous  Once 08/15/17 1113 08/15/17 1221   08/15/17 1115  metroNIDAZOLE (FLAGYL) IVPB 500 mg     500 mg 100 mL/hr over 60 Minutes Intravenous  Once 08/15/17 1113 08/15/17 1317       Objective: Physical Exam: Vitals:   08/16/17 1032 08/16/17 1108 08/16/17 1415 08/16/17 1443  BP: (!) 81/53 (!) 87/54 (!) 88/56 (!) 94/59  Pulse: (!) 109 (!) 107 (!) 116 (!) 115  Resp: (!) 22 17 (!) 21 (!) 22  Temp:  98.8 F (37.1 C)    TempSrc:      SpO2: 95% 97% 92% 95%   Weight:      Height:        Intake/Output Summary (Last 24 hours) at 08/16/2017 1521 Last data filed at 08/16/2017 1447 Gross per 24 hour  Intake 710 ml  Output 125 ml  Net 585 ml   Filed Weights   08/15/17 1009 08/15/17 1846  Weight: 46.7 kg (103 lb) 46.7 kg (103 lb)   General: Alert, Awake and Oriented to Time, Place and Person. Appear in severe distress, affect flat Eyes: PERRL, Conjunctiva normal ENT: Oral Mucosa clear dry. Neck: difficult to assess JVD, no Abnormal Mass Or lumps Cardiovascular: S1 and S2 Present, no Murmur, Peripheral Pulses Present Respiratory: increased respiratory effort, Bilateral Air entry equal and Decreased, no use of accessory muscle, bilateral  Crackles, no wheezes Abdomen: Bowel Sound present, distended and diffuse mild to moderate tenderness, no hernia Skin: no redness, no Rash, no induration Extremities: no Pedal edema, no calf tenderness Neurologic: Grossly no focal neuro deficit. Bilaterally Equal motor strength  Data Reviewed: CBC: Recent Labs  Lab 08/15/17 1002 08/15/17 1032 08/15/17 1938 08/16/17 0452  WBC 35.5*  --  38.3* 35.0*  NEUTROABS 33.7*  --   --   --   HGB 13.1 13.9 11.2* 11.0*  HCT 38.3 41.0 34.5* 34.2*  MCV 83.4  --  83.3 84.0  PLT 303  --  287 293   Basic Metabolic Panel: Recent Labs  Lab 08/15/17 1002 08/15/17 1032 08/15/17 1938 08/16/17 0452  NA 141 139  --  142  K 2.7* 3.0*  --  4.1  CL 94* 95*  --  100*  CO2 30  --   --  28  GLUCOSE 67 78  --  45*  BUN 37* 42*  --  46*  CREATININE 1.20* 1.20*  --  1.42*  CALCIUM 9.2  --   --  9.1  MG  --   --  1.9 1.9    Liver Function Tests: Recent Labs  Lab 08/15/17 1002 08/16/17 0452  AST 31 32  ALT 13* 12*  ALKPHOS 194* 150*  BILITOT 3.0* 1.8*  PROT 7.2 5.9*  ALBUMIN 2.3* 1.9*   Recent Labs  Lab 08/15/17 1002  LIPASE 18   No results for input(s): AMMONIA in the last 168 hours. Coagulation Profile: Recent Labs  Lab 08/15/17 1938  INR 1.65    Cardiac Enzymes: No results for input(s): CKTOTAL, CKMB, CKMBINDEX, TROPONINI in the last 168 hours. BNP (last 3 results) No results for input(s): PROBNP in the last 8760 hours. CBG: Recent Labs  Lab 08/16/17 0634 08/16/17 0746 08/16/17 0832 08/16/17 0954  GLUCAP 47* 61* 68 131*   Studies: Ir Image Guided Drainage Percut Cath  Peritoneal Retroperit  Result Date: 08/15/2017 INDICATION: 67 year old female with a history of prior colonic perforation and colon resection currently septic with evidence of a large abscess cavity in the left lower quadrant as well as ascites and evidence of peritoneal enhancement. She presents for percutaneous drain placement and paracentesis. EXAM: IR IMAGE GUIDED DRAINAGE PERCUT CATH PERITONEAL RETROPERIT; IR PARACENTESIS MEDICATIONS: The patient is currently admitted to the hospital and receiving intravenous antibiotics. The antibiotics were administered within an appropriate time frame prior to the initiation of the procedure. ANESTHESIA/SEDATION: Fentanyl 25 mcg IV; Versed 0.5 mg IV Moderate Sedation Time:  17 minutes The patient was continuously monitored during the procedure by the interventional radiology nurse under my direct supervision. COMPLICATIONS: None immediate. PROCEDURE: Informed written consent was obtained from the patient after a thorough discussion of the procedural risks, benefits and alternatives. All questions were addressed. Maximal Sterile Barrier Technique was utilized including caps, mask, sterile gowns, sterile gloves, sterile drape, hand hygiene and skin antiseptic. A timeout was performed prior to the initiation of the procedure. Ultrasound was used to interrogate the left upper quadrant. Perihepatic ascites is evident. Local anesthesia was attained by infiltration with 1% lidocaine. Under real-time sonographic guidance, a 17 gauge 7 cm Yueh centesis catheter was carefully advanced into the fluid collection. An image was obtained and  stored for the medical record. Paracentesis was then performed yielding approximately 400 mL of cloudy ascitic fluid. A sample was sent for culture. Attention was next turned to the left flank. Ultrasound demonstrates a large complex fluid collection affiliated with the left lower quadrant abdominal wall. Local anesthesia was again attained by infiltration with 1% lidocaine. A small dermatotomy was made. Under real-time sonographic guidance, a 10 French drainage catheter was advanced into the complex fluid collection using trocar technique. The catheter was advanced off the trocar and the locking pigtail loop was formed. Aspiration yields approximately 200 mL thick purulent fluid. The abscess cavity was flushed and the drainage catheter connected to JP bulb suction. The drain was then secured to the skin with 0 Prolene suture. IMPRESSION: 1. Paracentesis yields approximately 400 mL  cloudy ascitic fluid highly concerning for bacterial peritonitis. 2. Percutaneous 12 French drain placement yielding approximately 200 mL of thick purulent fluid. Of note, the patient has a very thin body habitus in the drainage catheter is just deep to the skin surface. Given the superficial nature of the drain, inadvertent displacement/removal is a concern. Recommend extreme care when changing bandages. Signed, Sterling Big, MD [facility] Radiology Electronically Signed   By: Malachy Moan M.D.   On: 08/15/2017 17:23   Ir Paracentesis  Result Date: 08/15/2017 INDICATION: 67 year old female with a history of prior colonic perforation and colon resection currently septic with evidence of a large abscess cavity in the left lower quadrant as well as ascites and evidence of peritoneal enhancement. She presents for percutaneous drain placement and paracentesis. EXAM: IR IMAGE GUIDED DRAINAGE PERCUT CATH PERITONEAL RETROPERIT; IR PARACENTESIS MEDICATIONS: The patient is currently  admitted to the hospital and receiving intravenous antibiotics. The antibiotics were administered within an appropriate time frame prior to the initiation of the procedure. ANESTHESIA/SEDATION: Fentanyl 25 mcg IV; Versed 0.5 mg IV Moderate Sedation Time:  17 minutes The patient was continuously monitored during the procedure by the interventional radiology nurse under my direct supervision. COMPLICATIONS: None immediate. PROCEDURE: Informed written consent was obtained from the patient after a thorough discussion of the procedural risks, benefits and alternatives. All questions were addressed. Maximal Sterile Barrier Technique was utilized including caps, mask, sterile gowns, sterile gloves, sterile drape, hand hygiene and skin antiseptic. A timeout was performed prior to the initiation of the procedure. Ultrasound was used to interrogate the left upper quadrant. Perihepatic ascites is evident. Local anesthesia was attained by infiltration with 1% lidocaine. Under real-time sonographic guidance, a 17 gauge 7 cm Yueh centesis catheter was carefully advanced into the fluid collection. An image was obtained and stored for the medical record. Paracentesis was then performed yielding approximately 400 mL of cloudy ascitic fluid. A sample was sent for culture. Attention was next turned to the left flank. Ultrasound demonstrates a large complex fluid collection affiliated with the left lower quadrant abdominal wall. Local anesthesia was again attained by infiltration with 1% lidocaine. A small dermatotomy was made. Under real-time sonographic guidance, a 10 French drainage catheter was advanced into the complex fluid collection using trocar technique. The catheter was advanced off the trocar and the locking pigtail loop was formed. Aspiration yields approximately 200 mL thick purulent fluid. The abscess cavity was flushed and the drainage catheter connected to JP bulb suction. The drain was then secured to the skin with 0  Prolene suture. IMPRESSION: 1. Paracentesis yields approximately 400 mL cloudy ascitic fluid highly concerning for bacterial peritonitis. 2. Percutaneous 12 French drain placement yielding approximately 200 mL of thick purulent fluid. Of note, the patient has a very thin body habitus in the drainage catheter is just deep to the skin surface. Given the superficial nature of the drain, inadvertent displacement/removal is a concern. Recommend extreme care when changing bandages. Signed, Sterling Big, MD [facility] Radiology Electronically Signed   By: Malachy Moan M.D.   On: 08/15/2017 17:23    Scheduled Meds: . enoxaparin (LOVENOX) injection  30 mg Subcutaneous Q24H  . nicotine  14 mg Transdermal Daily  . sodium chloride flush  5 mL Intracatheter Q8H   Continuous Infusions: . dextrose 5 % and 0.45 % NaCl with KCl 20 mEq/L 50 mL/hr at 08/16/17 1222  . meropenem (MERREM) IV Stopped (08/16/17 1005)  . [  START ON 08/17/2017] vancomycin     PRN Meds: acetaminophen **OR** acetaminophen, guaiFENesin-dextromethorphan, HYDROmorphone (DILAUDID) injection, ondansetron **OR** ondansetron (ZOFRAN) IV  Time spent: The patient is critically ill with multiple organ systems failure and requires high complexity decision making for assessment and support, frequent evaluation and titration of therapies. Critical Care Time devoted to patient care services described in this note is 35 minutes   Author: Lynden Oxford, MD [facility] Pager: [phone] 08/16/2017 3:21 PM  If 7PM-7AM, please contact night-coverage at [email or web address], password [facility]

## 2017-08-16 NOTE — Progress Notes (Signed)
ANTIBIOTIC CONSULT NOTE - INITIAL  Pharmacy Consult for vancomycin and meropenem Indication: intra-abdominal infection  Allergies  Allergen Reactions  . Ciprofloxacin Rash  . Levofloxacin Rash    Rash on back after 5 days therapy.   . Nickel Rash    Anything with nickel in it.   Marland Kitchen Penicillins Rash    Tolerates cephalosporins   Has patient had a PCN reaction causing immediate rash, facial/tongue/throat swelling, SOB or lightheadedness with hypotension: No Has patient had a PCN reaction causing severe rash involving mucus membranes or skin necrosis: No Ha patient had a PCN reaction that required hospitalization No Has patient had a PCN reaction occurring within the last 10 years: No If all of the above answers are "NO", then may proceed with: Cephalosporins       Patient Measurements: Height:  (157.5 cm) Weight: 103 lb (46.7 kg) IBW/kg (Calculated) : 50.1  Vital Signs: Temp: 98.1 F (36.7 C) (05/11 0311) Temp Source: Oral (05/11 0311) BP: 94/65 (05/11 0435) Pulse Rate: 113 (05/11 0435) Intake/Output from previous day: 05/10 0701 - 05/11 0700 In: 2107.6 [I.V.:1000; IV Piggyback:1107.6] Out: 65 [Drains:65] Intake/Output from this shift: No intake/output data recorded.  Labs: Recent Labs    08/15/17 1002 08/15/17 1032 08/15/17 1938 08/16/17 0452  WBC 35.5*  --  38.3* 35.0*  HGB 13.1 13.9 11.2* 11.0*  PLT 303  --  287 293  CREATININE 1.20* 1.20*  --  1.42*   Estimated Creatinine Clearance: 27.6 mL/min (A) (by C-G formula based on SCr of 1.42 mg/dL (H)). No results for input(s): VANCOTROUGH, VANCOPEAK, VANCORANDOM, GENTTROUGH, GENTPEAK, GENTRANDOM, TOBRATROUGH, TOBRAPEAK, TOBRARND, AMIKACINPEAK, AMIKACINTROU, AMIKACIN in the last 72 hours.   Microbiology: Recent Results (from the past 720 hour(s))  Aerobic/Anaerobic Culture (surgical/deep wound)     Status: None (Preliminary result)   Collection Time: 08/15/17  4:42 PM  Result Value Ref Range Status   Specimen Description ABSCESS  Final   Special Requests PARACENTESIS  Final   Gram Stain   Final    ABUNDANT WBC PRESENT, PREDOMINANTLY PMN ABUNDANT GRAM POSITIVE COCCI Performed at [facility], [address], [place], [place] [address]    Culture PENDING  Incomplete   Report Status PENDING  Incomplete  MRSA PCR Screening     Status: None   Collection Time: 08/16/17  4:58 AM  Result Value Ref Range Status   MRSA by PCR NEGATIVE NEGATIVE Final    Comment:        The GeneXpert MRSA Assay (FDA approved for NASAL specimens only), is one component of a comprehensive MRSA colonization surveillance program. It is not intended to diagnose MRSA infection nor to guide or monitor treatment for MRSA infections. Performed at [facility], [address] [place], [place] [address]     Medical History: Past Medical History:  Diagnosis Date  . Acute CHF (congestive heart failure) (HCC) 06/24/2017  . ETOH abuse   . Pneumonia     Medications:  Scheduled:  . enoxaparin (LOVENOX) injection  40 mg Subcutaneous Q24H  . nicotine  14 mg Transdermal Daily  . sodium chloride flush  5 mL Intracatheter Q8H    Assessment: 67 yo female presented to ED on 4/10 with abdominal pain and hypotension. IR reported high likelihood of infected ascites c/w bacterial peritonitis and LLQ abscess with thick pus. Pharmacy consulted to start meropenem and vancomycin.   Goal of Therapy:  Vancomycin trough level 15-20 mcg/ml  Plan:  Vancomycin loading dose 1,000 mg  x 1 Vancomycin 500 mg every 24 hours Meropenem 1000 mg every 24 hours Measure antibiotic drug levels at steady state Follow up culture results  Ladell Pier, PharmD Pharmacy Resident Clinical Phone for 08/16/2017 until 3:30pm: [phone] If after 3:30pm, please call main pharmacy at [phone] 08/16/2017 8:14 AM

## 2017-08-16 NOTE — Consult Note (Signed)
Name:Rachel Williams, Rachel Williams Female, 67 y.o., [birth year]  Rachel Williams Female, 67 y.o., February 11, 1951  Chart reviewed, patient seen and examined.  Consulted for severe sepsis, borderline BP, intra abdominal abscess.  Chief Complaint  Patient presents with  . Abdominal Pain  . 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 Surgery felt- pt is high risk for surgical exploration but would need it if she fails medical  Management.  Today she had an episode of lethargy, transient hypotension- systolic 60, responded to 1 lit ns and 12.5 g albumin.  Sitting up in bed, talking, 90/60 at present, no acute distress       has a past medical history of Acute CHF (congestive heart failure) (HCC) (06/24/2017), ETOH abuse, and Pneumonia.   has a past surgical history that includes Colon resection; Colostomy; IR IMAGE GUIDED DRAINAGE PERCUT CATH  PERITONEAL RETROPERIT (08/15/2017); and IR Paracentesis (08/15/2017).  Family History:  NO H/O CIRRHOSIS  Social History:  ALCOHOL- drinks wine; smokes cigarettes.   Home Medications:  Inpatient Medications: . enoxaparin (LOVENOX) injection  30 mg Subcutaneous Q24H  . nicotine  14 mg Transdermal Daily  . sodium chloride flush  5 mL Intracatheter Q8H     ROS Limited by clinical condition No fever + SOB,  cough-> clear mucus  no hemoptysis No hematemesis, no melena Oliguria +lethargy No skin rash   Physical Exam: Blood pressure (!) 94/59, pulse (!) 113, temperature 98.8 F (37.1 C), resp. rate (!) 23, height  (1.575 m), weight 103 lb (46.7 kg), SpO2 94 %.  General appearance   Chronically ill looking, no acute distress, alert, oriented, speaking well   HEENT   no pallor No jaundice No tongue swelling No swelling in the neck No Oral thrush     Cardiovascular S1 and S2 regular no Murmur/gallop     Respiratory Chest expansion equal Breath sounds diminished in bases No rhonchi or rales     Abdomen   Soft, + tender, +distended No organomegaly,No mass Bowel sounds+ Scar midline LLQ drain   Extremities No swelling,No tenderness No clubbing Capillary refill reduced    Neurological Alert, awake, oriented Pupils equal and reacting Tone normal Grossly intact   Labs  PARACENTESIS    Gram Stain ABUNDANT WBC PRESENT, PREDOMINANTLY PMN  ABUNDANT GRAM POSITIVE COCCI      Results for [name], Williams (MRN [id number]) as of 08/16/2017 15:50  Ref. Range 08/15/2017 19:38 08/16/2017 04:52  Sodium Latest Ref Range: 135 - 145 mmol/L  142  Potassium Latest Ref Range: 3.5 - 5.1 mmol/L  4.1  Chloride Latest Ref Range: 101 - 111 mmol/L  100 (L)  CO2 Latest Ref Range: 22 - 32 mmol/L  28  Glucose Latest Ref Range: 65 - 99 mg/dL  45 (L)  BUN Latest Ref Range: 6 - 20 mg/dL  46 (H)  Creatinine Latest Ref Range: 0.44 - 1.00 mg/dL  0.86 (H)  Calcium Latest Ref Range: 8.9 - 10.3 mg/dL  9.1  Anion gap Latest Ref Range: 5 - 15   14  Magnesium Latest Ref Range: 1.7 - 2.4 mg/dL 1.9 1.9  Alkaline Phosphatase Latest Ref  Range: 38 - 126 U/L  150 (H)  Albumin Latest Ref Range: 3.5 - 5.0 g/dL  1.9 (L)  AST Latest Ref Range: 15 - 41 U/L  32  ALT Latest Ref Range: 14 - 54 U/L  12 (L)  Total Protein Latest Ref Range: 6.5 - 8.1 g/dL  5.9 (L)  Total Bilirubin Latest Ref Range: 0.3 - 1.2 mg/dL  1.8 (H)  GFR, Est Non African American Latest Ref Range: >60 mL/min  37 (L)  GFR, Est African American Latest Ref Range: >60 mL/min  43 (L)  WBC Latest Ref Range: 4.0 - 10.5 K/uL 38.3 (H) 35.0 (H)  RBC Latest Ref Range: 3.87 - 5.11 MIL/uL 4.14 4.07  Hemoglobin Latest Ref Range:  12.0 - 15.0 g/dL 91.4 (L) 78.2 (L)  HCT Latest Ref Range: 36.0 - 46.0 % 34.5 (L) 34.2 (L)  MCV Latest Ref Range: 78.0 - 100.0 fL 83.3 84.0  MCH Latest Ref Range: 26.0 - 34.0 pg 27.1 27.0  MCHC Latest Ref Range: 30.0 - 36.0 g/dL 95.6 21.3  RDW Latest Ref Range: 11.5 - 15.5 % 15.4 15.8 (H)  Platelets Latest Ref Range: 150 - 400 K/uL 287 293  Prothrombin Time Latest Ref Range: 11.4 - 15.2 seconds 19.3 (H)   INR Unknown 1.65   Results for Rachel Williams (MRN [id number]) as of 08/16/2017 15:50  Ref. Range 08/15/2017 10:33 08/15/2017 13:32  Lactic Acid, Venous Latest Ref Range: 0.5 - 1.9 mmol/L 2.33 (HH) 1.43    POCUS- IVC full; non collapsible;  Good LV contractility; no pericardial effusion. RV enlarged (known). R-small pleural effusion. L- small area of atelectasis No free fluid In LLQ,RLG  Xray  Chest  08-16-2017 Bilateral pleural effusions, pulm vascular congestion   CT scan abdomen    08-15-2017  1. 9 cm irregular cystic mass in the left paracolic gutter, favor abscess over cavitary neoplasm. No pneumoperitoneum or adjacent bowel perforation to explain this collection, suspect missed perforation that has subsequently sealed. It is noted that there was an abscess in this location in 2004 by CT. 2. Moderate ascites with loculation and peritonitis findings, presumably from the same. 3. Suspect cirrhosis.  Consider SBP as cause of above. 4. Distal gastritis. 5. Mild mesenteric and upper retroperitoneal adenopathy presumably reactive to the above. Suggest EGD after convalescence. 6. T12, L1, and L2 endplate fractures that have occurred since chest CT 06/25/2017. There are older endplate fractures at T10 and T11.   CT chest 06-25-2017 1. Moderate right pleural effusion with compressive atelectasis in the right base. Area of apparent pneumonia in the periphery of the superior segment right lower lobe. Atelectatic change noted in the right middle lobe. Left lung is clear. 2. Axillary  adenopathy bilaterally of uncertain etiology. No appreciable hilar or mediastinal adenopathy. 3. There are now several compression fractures in the thoracic spine. There is in plate sclerosis at several levels of fracture. The etiology for this finding is uncertain. Question possibility of areas of discitis to account for this finding. In this regard, given the change from prior study, it may be reasonable to consider thoracic MR pre and post-contrast to assess for possible discitis. 4. Aortic atherosclerosis noted. Foci of great vessel and coronary artery calcification identified. 5.  Upper abdominal ascites. 6.  Suspect a degree of hepatic cirrhosis.    IR 08-15-2017 1. Paracentesis yields approximately 400 mL cloudy ascitic fluid highly concerning for bacterial peritonitis. 2. Percutaneous 12 French drain placement yielding approximately 200 mL of thick purulent fluid.  Ekg 08-15-17 sinus tachycardia, RBBB  TTE 06-25-2017 Left ventricle: The cavity size was normal. Wall thickness was   normal. Systolic function was normal. The estimated ejection   fraction was in the range of 60% to 65%. Wall motion was normal;   there were no regional wall motion abnormalities. Doppler   parameters are consistent with abnormal left ventricular   relaxation (grade 1 diastolic dysfunction). - Ventricular septum: The contour showed diastolic flattening and   systolic flattening. - Left atrium: The atrium was mildly dilated. - Right ventricle: The cavity size was moderately dilated. Systolic   function was mildly reduced. - Right atrium: The atrium was moderately dilated. - Tricuspid valve: There was moderate-severe regurgitation. - Pulmonary arteries: Systolic pressure was severely increased. PA   peak pressure: 76 mm Hg (S).     Assessment and Plan:      67   Y/o   female       With    H/o  Cirrhosis, LLQ abscess, peritonitis, severe sepsis, leukocytosis;  Hypoalbuminemia,  malnutrition, low BMI, tolerating lower MAP, (cirrhotics tend to have Low BP due to low SVR)  H/o cor pulmonale, PHT, TR, diastolic dysfunction   May have  Combination of portopulmonary htn (Group 1), DD ( Group 2) , COPD (Group 3)  Recent VQ scan did not show perfusion defects.       Neurology Clinically not encephalopathic  Cardiology  Monitor HD Use albumin for volume expansion; Avoid iv fluids as she may leak due to low albumin If bp drops further, start pressors. Would need central venous access   Pulmonary  02 prn F/u cxr   GI /nutrition  Diet as tolerated   Renal, fluids, electrolytes Oliguric likely from sepsis; at risk for ATN. Closely monitor renal  Fxn.   Hematology Monitor CBC. dvt prophlxs   Endocrinology Monitor sugars  Infectious diseases F/u c/s Broad antibx. Surgery following. Defer to Gen Surgery on decision reg timing of OR if needed.       Counseling Patient/Family:  - son and pt updated.      Code status: full    Thank you for letting me participate in the care of your patient.  ^^^^^^^^^^ I  Have personally spent  60    Minutes  In the care of this Patient providing Critical care Services; Time includes review of chart, labs, imaging, coordinating care with other physicians and healthcare team members. Also includes time for frequent reevaluation and additional treatment implementation due to change in clinical condiiton of patient. Excludes time spent for Procedure and Teaching.   ^^^^^^^^^^  Note subject to typographical and grammatical errors;   Any formal questions or concerns about the content, text, or information contained within the body of this dictation should be directly addressed to the physician  for  clarification.   Caryl Comes, MD Pulmonary and Critical Care Medicine [place] [facility] Pager: [phone]

## 2017-08-17 LAB — URINALYSIS, ROUTINE W REFLEX MICROSCOPIC
BILIRUBIN URINE: NEGATIVE
Glucose, UA: NEGATIVE mg/dL
Ketones, ur: NEGATIVE mg/dL
Nitrite: NEGATIVE
PH: 5 (ref 5.0–8.0)
Protein, ur: NEGATIVE mg/dL
SPECIFIC GRAVITY, URINE: 1.019 (ref 1.005–1.030)

## 2017-08-17 LAB — CBC
HCT: 30.4 % — ABNORMAL LOW (ref 36.0–46.0)
Hemoglobin: 9.8 g/dL — ABNORMAL LOW (ref 12.0–15.0)
MCH: 26.1 pg (ref 26.0–34.0)
MCHC: 32.2 g/dL (ref 30.0–36.0)
MCV: 81.1 fL (ref 78.0–100.0)
PLATELETS: 243 10*3/uL (ref 150–400)
RBC: 3.75 MIL/uL — AB (ref 3.87–5.11)
RDW: 15.7 % — ABNORMAL HIGH (ref 11.5–15.5)
WBC: 23.3 10*3/uL — ABNORMAL HIGH (ref 4.0–10.5)

## 2017-08-17 LAB — COMPREHENSIVE METABOLIC PANEL
ALK PHOS: 120 U/L (ref 38–126)
ALT: 11 U/L — ABNORMAL LOW (ref 14–54)
ANION GAP: 10 (ref 5–15)
AST: 25 U/L (ref 15–41)
Albumin: 2.4 g/dL — ABNORMAL LOW (ref 3.5–5.0)
BUN: 55 mg/dL — ABNORMAL HIGH (ref 6–20)
CALCIUM: 9.1 mg/dL (ref 8.9–10.3)
CHLORIDE: 103 mmol/L (ref 101–111)
CO2: 26 mmol/L (ref 22–32)
CREATININE: 1.62 mg/dL — AB (ref 0.44–1.00)
GFR, EST AFRICAN AMERICAN: 36 mL/min — AB (ref >60)
GFR, EST NON AFRICAN AMERICAN: 31 mL/min — AB (ref >60)
Glucose, Bld: 64 mg/dL — ABNORMAL LOW (ref 65–99)
Potassium: 3.8 mmol/L (ref 3.5–5.1)
Sodium: 139 mmol/L (ref 135–145)
Total Bilirubin: 1.6 mg/dL — ABNORMAL HIGH (ref 0.3–1.2)
Total Protein: 5.8 g/dL — ABNORMAL LOW (ref 6.5–8.1)

## 2017-08-17 LAB — GLUCOSE, CAPILLARY
GLUCOSE-CAPILLARY: 60 mg/dL — AB (ref 65–99)
GLUCOSE-CAPILLARY: 140 mg/dL — AB (ref 65–99)
GLUCOSE-CAPILLARY: 90 mg/dL (ref 65–99)
GLUCOSE-CAPILLARY: 95 mg/dL (ref 65–99)
Glucose-Capillary: 104 mg/dL — ABNORMAL HIGH (ref 65–99)
Glucose-Capillary: 112 mg/dL — ABNORMAL HIGH (ref 65–99)
Glucose-Capillary: 56 mg/dL — ABNORMAL LOW (ref 65–99)
Glucose-Capillary: 73 mg/dL (ref 65–99)
Glucose-Capillary: 74 mg/dL (ref 65–99)
Glucose-Capillary: 85 mg/dL (ref 65–99)
Glucose-Capillary: 87 mg/dL (ref 65–99)

## 2017-08-17 LAB — MAGNESIUM: MAGNESIUM: 1.9 mg/dL (ref 1.7–2.4)

## 2017-08-17 MED ORDER — KCL IN DEXTROSE-NACL 20-5-0.45 MEQ/L-%-% IV SOLN
INTRAVENOUS | Status: DC
  Administered 2017-08-17 – 2017-08-19 (×4): via INTRAVENOUS
  Administered 2017-08-22: 1000 mL via INTRAVENOUS
  Filled 2017-08-17 (×8): qty 1000

## 2017-08-17 MED ORDER — WHITE PETROLATUM EX OINT
TOPICAL_OINTMENT | CUTANEOUS | Status: AC
  Administered 2017-08-17: 09:00:00
  Filled 2017-08-17: qty 28.35

## 2017-08-17 MED ORDER — DEXTROSE 50 % IV SOLN
INTRAVENOUS | Status: AC
  Administered 2017-08-17: 25 mL
  Filled 2017-08-17: qty 50

## 2017-08-17 MED ORDER — SODIUM CHLORIDE 0.9 % IV SOLN
500.0000 mg | Freq: Two times a day (BID) | INTRAVENOUS | Status: DC
  Administered 2017-08-17 – 2017-08-18 (×3): 500 mg via INTRAVENOUS
  Filled 2017-08-17 (×4): qty 0.5

## 2017-08-17 MED ORDER — ALBUMIN HUMAN 5 % IV SOLN
25.0000 g | Freq: Four times a day (QID) | INTRAVENOUS | Status: DC
  Administered 2017-08-17: 25 g via INTRAVENOUS
  Filled 2017-08-17 (×2): qty 500

## 2017-08-17 NOTE — Progress Notes (Signed)
Progress Note: General Surgery Service   Assessment/Plan: Patient Active Problem List   Diagnosis Date Noted  . Severe sepsis (HCC) 08/15/2017  . Chronic diastolic CHF (congestive heart failure) (HCC) 08/15/2017  . Pulmonary hypertension (HCC) 06/26/2017  . CHF (congestive heart failure) (HCC) 06/25/2017  . Acute CHF (congestive heart failure) (HCC) 06/24/2017  . Right middle lobe pulmonary infiltrate 06/24/2017  . Sepsis, unspecified organism (HCC) 05/21/2016  . Elevated LFTs 05/21/2016  . Macrocytic anemia 05/21/2016  . Influenza A 05/21/2016  . Malnutrition of moderate degree 05/29/2015  . NSVT (nonsustained ventricular tachycardia) (HCC) 05/29/2015  . Hyponatremia 05/27/2015  . Recurrent falls 05/27/2015  . Facial bruising 05/27/2015  . Hypomagnesemia 05/27/2015  . Hypokalemia 05/27/2015  . Alcohol use (HCC), daily 2-3 drinks of vodka 05/27/2015  . Smoker 05/27/2015  . Dehydration 05/27/2015  . Vertebral compression fracture (HCC) 05/27/2015  . Acute pharyngitis 05/27/2015  . Chronic constipation 05/27/2015  . Generalized weakness    S/p IR drainage of abscess, WBC decreased, abdominal tenderness unchanged -continue abx -continue drain management -continue bowel rest (ok with ice chips) -up to chair    LOS: 2 days  Chief Complaint/Subjective: Continue abdominal pain, some flatus yesterday, no nausea or vomiting  Objective: Vital signs in last 24 hours: Temp:  [98.1 F (36.7 C)-98.8 F (37.1 C)] 98.1 F (36.7 C) (05/12 0814) Pulse Rate:  [95-122] 115 (05/12 0624) Resp:  [15-25] 22 (05/12 0624) BP: (81-135)/(49-106) 135/81 (05/12 0624) SpO2:  [92 %-98 %] 97 % (05/12 0624) Last BM Date: 08/14/17  Intake/Output from previous day: 05/11 0701 - 05/12 0700 In: 1200 [P.O.:440; I.V.:5; IV Piggyback:700] Out: 285 [Urine:240; Drains:45] Intake/Output this shift: No intake/output data recorded.  Lungs: CTAb  Cardiovascular: tachycardic  Abd: soft, TTP  throughout, drain with thin yellow drainage in bulb  Extremities: no edema  Neuro: AOx4  Lab Results: CBC  Recent Labs    08/16/17 0452 08/17/17 0422  WBC 35.0* 23.3*  HGB 11.0* 9.8*  HCT 34.2* 30.4*  PLT 293 243   BMET Recent Labs    08/16/17 0452 08/17/17 0422  NA 142 139  K 4.1 3.8  CL 100* 103  CO2 28 26  GLUCOSE 45* 64*  BUN 46* 55*  CREATININE 1.42* 1.62*  CALCIUM 9.1 9.1   PT/INR Recent Labs    08/15/17 1938  LABPROT 19.3*  INR 1.65   ABG No results for input(s): PHART, HCO3 in the last 72 hours.  Invalid input(s): PCO2, PO2  Studies/Results:  Anti-infectives: Anti-infectives (From admission, onward)   Start     Dose/Rate Route Frequency Ordered Stop   08/17/17 0900  vancomycin (VANCOCIN) 500 mg in sodium chloride 0.9 % 100 mL IVPB     500 mg 100 mL/hr over 60 Minutes Intravenous Every 24 hours 08/16/17 0816     08/16/17 1200  cefTRIAXone (ROCEPHIN) 1 g in sodium chloride 0.9 % 100 mL IVPB  Status:  Discontinued     1 g 200 mL/hr over 30 Minutes Intravenous Every 24 hours 08/15/17 1351 08/16/17 0756   08/16/17 0830  vancomycin (VANCOCIN) IVPB 1000 mg/200 mL premix     1,000 mg 200 mL/hr over 60 Minutes Intravenous  Once 08/16/17 0816 08/16/17 1117   08/16/17 0830  meropenem (MERREM) 1 g in sodium chloride 0.9 % 100 mL IVPB     1 g 200 mL/hr over 30 Minutes Intravenous Every 12 hours 08/16/17 0816     08/15/17 2200  metroNIDAZOLE (FLAGYL) IVPB 500 mg  Status:  Discontinued     500 mg 100 mL/hr over 60 Minutes Intravenous Every 8 hours 08/15/17 1351 08/16/17 0757   08/15/17 1115  cefTRIAXone (ROCEPHIN) 2 g in sodium chloride 0.9 % 100 mL IVPB     2 g 200 mL/hr over 30 Minutes Intravenous  Once 08/15/17 1113 08/15/17 1221   08/15/17 1115  metroNIDAZOLE (FLAGYL) IVPB 500 mg     500 mg 100 mL/hr over 60 Minutes Intravenous  Once 08/15/17 1113 08/15/17 1317      Medications: Scheduled Meds: . enoxaparin (LOVENOX) injection  30 mg Subcutaneous  Q24H  . nicotine  14 mg Transdermal Daily  . sodium chloride flush  5 mL Intracatheter Q8H   Continuous Infusions: . albumin human 25 g (08/17/17 0527)  . dextrose 5 % and 0.45 % NaCl with KCl 20 mEq/L    . meropenem (MERREM) IV Stopped (08/17/17 0906)  . vancomycin 500 mg (08/17/17 0906)   PRN Meds:.acetaminophen **OR** acetaminophen, guaiFENesin-dextromethorphan, HYDROmorphone (DILAUDID) injection, ondansetron **OR** ondansetron (ZOFRAN) IV  Rodman Pickle, MD Pg# [phone] [facility]

## 2017-08-17 NOTE — Progress Notes (Signed)
Ordered to flush the foley cath.  Prior to flushing the foley noticed the bed pad saturated in urine.  Deflated the ballon, 8mL found.  Total urine output 175 mL.  Bladder scanned total of .  Will replace foley and continue to monitor the patient

## 2017-08-17 NOTE — Progress Notes (Addendum)
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 Surgery felt- pt is high risk for surgical exploration but would need it if she fails medical  Management.  5/11-> she had an episode of lethargy, transient hypotension- systolic 60, responded to 1 lit ns and 12.5 g albumin.  Subjective; 5/12 No SOB;  abdominal pain persists. Clear mucus Urine out record - incorrect bed was soiled  . enoxaparin (LOVENOX) injection  30 mg Subcutaneous Q24H  . nicotine  14 mg Transdermal Daily  . sodium chloride flush  5 mL Intracatheter Q8H    vanco , meropenem  ROS- limited by critical condition No fever No vomiting No diarrhea   Physical exam:   Blood pressure 135/81, pulse (!) 115, temperature 98.1 F (36.7 C), temperature source Oral, resp. rate (!) 22, height  (1.575 m), weight 103 lb (46.7 kg), SpO2 97 %.   General appearance  Chronically ill looking, no acute distress, alert, oriented, speaking well   HEENT  no pallor No jaundice No Oral thrush     Cardiovascular S1 and S2 regular SEM tricuspid  Murmur +  Respiratory Chest expansion equal Breath sounds diminished in bases Few basal rales   Abdomen   Soft, + tender, +distended No organomegaly,No mass Bowel sounds+ Scar midline LLQ drain- minimal fluid- yellowish   Extremities No swelling,No tenderness   Neurological Alert, awake, oriented Pupils equal and reacting Tone normal Grossly intact   Results for CELESTA, FUNDERBURK (MRN [id number]) as of 08/17/2017 09:19  Ref. Range 08/17/2017  04:22  Sodium Latest Ref Range: 135 - 145 mmol/L 139  Potassium Latest Ref Range: 3.5 - 5.1 mmol/L 3.8  Chloride Latest Ref Range: 101 - 111 mmol/L 103  CO2 Latest Ref Range: 22 - 32 mmol/L 26  Glucose Latest Ref Range: 65 - 99 mg/dL 64 (L)  BUN Latest Ref Range: 6 - 20 mg/dL 55 (H)  Creatinine Latest Ref Range: 0.44 - 1.00 mg/dL 7.82 (H)  Calcium Latest Ref Range: 8.9 - 10.3 mg/dL 9.1  Anion gap Latest Ref Range: 5 - 15  10  Magnesium Latest Ref Range: 1.7 - 2.4 mg/dL 1.9  Alkaline Phosphatase Latest Ref Range: 38 - 126 U/L 120  Albumin Latest Ref Range: 3.5 - 5.0 g/dL 2.4 (L)  AST Latest Ref Range: 15 - 41 U/L 25  ALT Latest Ref Range: 14 - 54 U/L 11 (L)  Total Protein Latest Ref Range: 6.5 - 8.1 g/dL 5.8 (L)  Total Bilirubin Latest Ref Range: 0.3 - 1.2 mg/dL 1.6 (H)  Results for AUDRIANNA, DRISKILL (MRN [id number]) as of 08/17/2017 09:19  Ref. Range 08/16/2017 04:52 08/16/2017 15:46 08/16/2017 15:54 08/16/2017 19:04 08/17/2017 04:22  Lactic Acid, Venous Latest Ref Range: 0.5 - 1.9 mmol/L  1.3  1.2   WBC Latest Ref Range: 4.0 - 10.5 K/uL 35.0 (H)    23.3 (H)  RBC Latest Ref Range: 3.87 - 5.11 MIL/uL 4.07    3.75 (L)  Hemoglobin Latest Ref Range: 12.0 - 15.0 g/dL 57.8 (L)    9.8 (L)  HCT Latest Ref Range: 36.0 -  46.0 % 34.2 (L)    30.4 (L)  MCV Latest Ref Range: 78.0 - 100.0 fL 84.0    81.1  MCH Latest Ref Range: 26.0 - 34.0 pg 27.0    26.1  MCHC Latest Ref Range: 30.0 - 36.0 g/dL 81.1    91.4  RDW Latest Ref Range: 11.5 - 15.5 % 15.8 (H)    15.7 (H)  Platelets Latest Ref Range: 150 - 400 K/uL 293    243  Glucose Latest Ref Range: 65 - 99 mg/dL 45 (L)    64 (L)    ABSCESS    Special Requests PARACENTESIS   Gram Stain ABUNDANT WBC PRESENT, PREDOMINANTLY PMN  ABUNDANT GRAM POSITIVE COCCI      POCUS-08-16-17 IVC full; non collapsible;  Good LV contractility; no pericardial effusion. RV enlarged (known). R-small pleural effusion. L- small area of atelectasis No free fluid In  LLQ,RLG  Xray  Chest  08-16-2017 Bilateral pleural effusions, pulm vascular congestion   CT scan abdomen    08-15-2017  1. 9 cm irregular cystic mass in the left paracolic gutter, favor abscess over cavitary neoplasm. No pneumoperitoneum or adjacent bowel perforation to explain this collection, suspect missed perforation that has subsequently sealed. It is noted that there was an abscess in this location in 2004 by CT. 2. Moderate ascites with loculation and peritonitis findings, presumably from the same. 3. Suspect cirrhosis. Consider SBP as cause of above. 4. Distal gastritis. 5. Mild mesenteric and upper retroperitoneal adenopathy presumably reactive to the above. Suggest EGD after convalescence. 6. T12, L1, and L2 endplate fractures that have occurred since chest CT 06/25/2017. There are older endplate fractures at T10 and T11.   CT chest 06-25-2017 1. Moderate right pleural effusion with compressive atelectasis in the right base. Area of apparent pneumonia in the periphery of the superior segment right lower lobe. Atelectatic change noted in the right middle lobe. Left lung is clear. 2. Axillary adenopathy bilaterally of uncertain etiology. No appreciable hilar or mediastinal adenopathy. 3. There are now several compression fractures in the thoracic spine. There is in plate sclerosis at several levels of fracture. The etiology for this finding is uncertain. Question possibility of areas of discitis to account for this finding. In this regard, given the change from prior study, it may be reasonable to consider thoracic MR pre and post-contrast to assess for possible discitis. 4. Aortic atherosclerosis noted. Foci of great vessel and coronary artery calcification identified. 5. Upper abdominal ascites. 6. Suspect a degree of hepatic cirrhosis.    IR 08-15-2017 1. Paracentesis yields approximately 400 mL cloudy ascitic fluid highly concerning for bacterial  peritonitis. 2. Percutaneous 12 French drain placement yielding approximately 200 mL of thick purulent fluid.    Ekg 08-15-17 sinus tachycardia, RBBB  TTE 06-25-2017 Left ventricle: The cavity size was normal. Wall thickness was normal. Systolic function was normal. The estimated ejection fraction was in the range of 60% to 65%. Wall motion was normal; there were no regional wall motion abnormalities. Doppler parameters are consistent with abnormal left ventricular relaxation (grade 1 diastolic dysfunction). - Ventricular septum: The contour showed diastolic flattening and systolic flattening. - Left atrium: The atrium was mildly dilated. - Right ventricle: The cavity size was moderately dilated. Systolic function was mildly reduced. - Right atrium: The atrium was moderately dilated. - Tricuspid valve: There was moderate-severe regurgitation. - Pulmonary arteries: Systolic pressure was severely increased. PA peak pressure: 76 mm Hg (S).     Assessment and Plan:  67   Y/o   female       With    H/o  Cirrhosis, LLQ abscess, peritonitis, severe sepsis, leukocytosis;  Hypoalbuminemia, malnutrition, low BMI, tolerating lower MAP, (cirrhotics tend to have Low BP due to low SVR)  H/o cor pulmonale, PHT, TR, diastolic dysfunction   May have  Combination of portopulmonary htn (Group 1), DD ( Group 2) , COPD (Group 3)  Recent VQ scan did not show perfusion defects.   sepsis improving-     Neurology Clinically not encephalopathic  Cardiology  Stable HD; no need for pressor  complete albumin for volume expansion; Avoid iv fluids as she may leak due to low albumin If bp drops further, start pressors. If so, then need central venous access   Pulmonary  02 prn F/u cxr   GI /nutrition  Diet as tolerated   Renal, fluids, electrolytes Oliguric likely from sepsis; at risk for ATN. Closely monitor renal  Fxn.   Hematology Monitor CBC. dvt  prophlxs-enoxparain   Endocrinology Monitor sugars- on low side- getting dextrose fluids iv  Infectious diseases F/u c/s Broad antibx. Surgery following. Defer to Gen Surgery on decision reg timing of OR if needed.  On vanco, meropenem    Counseling Patient/Family:  - son, daughter and pt updated.    Code status: full      ^^^^^^^^^^ I  Have personally spent   45  Minutes  In the care of this Patient providing Critical care Services; Time includes review of chart, labs, imaging, coordinating care with other physicians and healthcare team members. Also includes time for frequent reevaluation and additional treatment implementation due to change in clinical condiiton of patient. Excludes time spent for Procedure and Teaching.   ^^^^^^^^^^   Caryl Comes, MD Pulmonary and Critical Care Medicine [place] [facility] Pager: [phone]

## 2017-08-17 NOTE — Progress Notes (Signed)
Patient had no urine output this shift with the foley cath.  Bladder scanned 319 mL.  Notified the MD on call.  Will continue to monitor and notify as needed

## 2017-08-17 NOTE — Progress Notes (Signed)
[facility] Progress Note  Patient: [facility] UJW:[id number]   PCP: Sigmund Hazel, MD DOB: 1950/11/27   DOA: 08/15/2017   DOS: 08/17/2017   Date of Service: the patient was seen and examined on 08/17/2017  Subjective: Feeling better, still has pain.  Breathing is better.  Cough with clear expectoration.  No further fever.  Brief hospital course: Pt. with PMH of chronic diastolic CHF, pulmonary hypertension, moderate to severe TR, active smoker, history of alcohol abuse, history of bowel perforation with colectomy; admitted on 08/15/2017, presented with complaint of abdominal pain, was found to have intra-abdominal abscess with sepsis. Currently further plan is continue current treatment.  Assessment and Plan: 1.  Sepsis with septic shock. Intra-abdominal abscess. Possible perforated diverticulitis. Presented with abdominal pain, CT scan of the abdomen showing moderate ascites with 9 cm irregular cystic mass in the left paracolic gutter abscess or cavitary neoplasm. No pneumoperitoneum. General surgery consulted recommend conservative management with IR guided drainage as well as drain placement. Drain placement performed on 08/15/2017. Paracentesis performed on 08/15/2017. Culture from the drain is growing staph aureus as well as anaerobe Blood cultures so far negative. Patient received aggressive IV hydration and become short of breath and required Lasix last night. Patient was started on IV ceftriaxone and Flagyl, for aggressive coverage in a patient with severe sepsis, transition to IV meropenem. Vancomycin added, also staph aureus growing in the abscess culture. MRSA PCR is negative. At present remains hypotensive after receiving another liter bolus as well as albumin infusion. Critical care consulted for further assistance, started on IV albumin, with BP better, I will hold off albumin for now.   2.  Acute on chronic diastolic dysfunction. Severe pulmonary  hypertension. Moderate to severe tricuspid regurgitation. Chest x-ray showed mild pulmonary edema and patient required 2 L of oxygen. Now appears in respiratory distress feels congested. Received IV Lasix last night after receiving IV hydration. At present monitor.  No indication for Lasix for now.  3.  Acute kidney injury. Acute urinary retention Renal function worsened.  Urine output minimal.   Continue the Foley catheter, continue gentle hydration Monitor in and out.  4.  Alcohol abuse. Possible cirrhosis based on the imaging. MELD score is 16, child class is class B. This assessment will likely change as the patient's primary issue of sepsis resolves anticipate improvement in patient's bilirubin level as well as creatinine level. No evidence of withdrawal for now.  Monitor.  5.  Compression fracture. Back pain. Recently hospitalized for the same. Requested by orthopedic surgery. Currently stable. Monitor.  6.  Active smoker. Nicotine patch provided. Patient was counseled for quit smoking.  7.  Hypokalemia. Replaced with IV. Monitor.  8.  Hypoglycemia. Due to poor p.o. intake. Replace with IV dextrose. Monitor.  9.  Sinus tachycardia. Due to sepsis. Monitor.  10.  Hypoalbuminemia. Due to cirrhosis. Also due to poor p.o. intake. Patient received IV albumin due to poor response to IV fluid resuscitation. We will monitor recommendation from critical care.  Diet: N.p.o. at present DVT Prophylaxis: subcutaneous Heparin  Advance goals of care discussion: full code  Family Communication: family was present at bedside, at the time of interview. The pt provided permission to discuss medical plan with the family. Opportunity was given to ask question and all questions were answered satisfactorily.   Disposition:  Discharge to be determined.  Consultants: General surgery PCCM IR Procedures: IR guided drain placement. Ultrasound-guided  paracentesis.  Antibiotics: Anti-infectives (From admission, onward)   Start  Dose/Rate Route Frequency Ordered Stop   08/17/17 2030  meropenem (MERREM) 500 mg in sodium chloride 0.9 % 100 mL IVPB     500 mg 200 mL/hr over 30 Minutes Intravenous Every 12 hours 08/17/17 1429     08/17/17 0900  vancomycin (VANCOCIN) 500 mg in sodium chloride 0.9 % 100 mL IVPB     500 mg 100 mL/hr over 60 Minutes Intravenous Every 24 hours 08/16/17 0816     08/16/17 1200  cefTRIAXone (ROCEPHIN) 1 g in sodium chloride 0.9 % 100 mL IVPB  Status:  Discontinued     1 g 200 mL/hr over 30 Minutes Intravenous Every 24 hours 08/15/17 1351 08/16/17 0756   08/16/17 0830  vancomycin (VANCOCIN) IVPB 1000 mg/200 mL premix     1,000 mg 200 mL/hr over 60 Minutes Intravenous  Once 08/16/17 0816 08/16/17 1117   08/16/17 0830  meropenem (MERREM) 1 g in sodium chloride 0.9 % 100 mL IVPB  Status:  Discontinued     1 g 200 mL/hr over 30 Minutes Intravenous Every 12 hours 08/16/17 0816 08/17/17 1429   08/15/17 2200  metroNIDAZOLE (FLAGYL) IVPB 500 mg  Status:  Discontinued     500 mg 100 mL/hr over 60 Minutes Intravenous Every 8 hours 08/15/17 1351 08/16/17 0757   08/15/17 1115  cefTRIAXone (ROCEPHIN) 2 g in sodium chloride 0.9 % 100 mL IVPB     2 g 200 mL/hr over 30 Minutes Intravenous  Once 08/15/17 1113 08/15/17 1221   08/15/17 1115  metroNIDAZOLE (FLAGYL) IVPB 500 mg     500 mg 100 mL/hr over 60 Minutes Intravenous  Once 08/15/17 1113 08/15/17 1317       Objective: Physical Exam: Vitals:   08/17/17 0753 08/17/17 0814 08/17/17 1153 08/17/17 1214  BP: (!) 98/56 125/74 (!) 96/58   Pulse: (!) 106 (!) 113 (!) 101   Resp: 18 (!) 21 17   Temp:  98.1 F (36.7 C)  98.1 F (36.7 C)  TempSrc:  Oral  Oral  SpO2: 96% 96% 97%   Weight:      Height:        Intake/Output Summary (Last 24 hours) at 08/17/2017 1536 Last data filed at 08/17/2017 1200 Gross per 24 hour  Intake 901.67 ml  Output 225 ml  Net 676.67 ml    Filed Weights   08/15/17 1009 08/15/17 1846  Weight: 46.7 kg (103 lb) 46.7 kg (103 lb)   General: Alert, Awake and Oriented to Time, Place and Person. Appear in severe distress, affect flat Eyes: PERRL, Conjunctiva normal ENT: Oral Mucosa clear dry. Neck: difficult to assess JVD, no Abnormal Mass Or lumps Cardiovascular: S1 and S2 Present, no Murmur, Peripheral Pulses Present Respiratory: increased respiratory effort, Bilateral Air entry equal and Decreased, no use of accessory muscle, bilateral  Crackles, no wheezes Abdomen: Bowel Sound present, distended and diffuse mild to moderate tenderness, no hernia Skin: no redness, no Rash, no induration Extremities: no Pedal edema, no calf tenderness Neurologic: Grossly no focal neuro deficit. Bilaterally Equal motor strength  Data Reviewed: CBC: Recent Labs  Lab 08/15/17 1002 08/15/17 1032 08/15/17 1938 08/16/17 0452 08/17/17 0422  WBC 35.5*  --  38.3* 35.0* 23.3*  NEUTROABS 33.7*  --   --   --   --   HGB 13.1 13.9 11.2* 11.0* 9.8*  HCT 38.3 41.0 34.5* 34.2* 30.4*  MCV 83.4  --  83.3 84.0 81.1  PLT 303  --  287 293 243   Basic Metabolic Panel:  Recent Labs  Lab 08/15/17 1002 08/15/17 1032 08/15/17 1938 08/16/17 0452 08/16/17 1554 08/17/17 0422  NA 141 139  --  142  --  139  K 2.7* 3.0*  --  4.1  --  3.8  CL 94* 95*  --  100*  --  103  CO2 30  --   --  28  --  26  GLUCOSE 67 78  --  45*  --  64*  BUN 37* 42*  --  46*  --  55*  CREATININE 1.20* 1.20*  --  1.42*  --  1.62*  CALCIUM 9.2  --   --  9.1  --  9.1  MG  --   --  1.9 1.9  --  1.9  PHOS  --   --   --   --  3.8  --     Liver Function Tests: Recent Labs  Lab 08/15/17 1002 08/16/17 0452 08/17/17 0422  AST 31 32 25  ALT 13* 12* 11*  ALKPHOS 194* 150* 120  BILITOT 3.0* 1.8* 1.6*  PROT 7.2 5.9* 5.8*  ALBUMIN 2.3* 1.9* 2.4*   Recent Labs  Lab 08/15/17 1002  LIPASE 18   No results for input(s): AMMONIA in the last 168 hours. Coagulation  Profile: Recent Labs  Lab 08/15/17 1938  INR 1.65   Cardiac Enzymes: No results for input(s): CKTOTAL, CKMB, CKMBINDEX, TROPONINI in the last 168 hours. BNP (last 3 results) No results for input(s): PROBNP in the last 8760 hours. CBG: Recent Labs  Lab 08/17/17 0204 08/17/17 0509 08/17/17 0622 08/17/17 0813 08/17/17 1213  GLUCAP 73 56* 112* 74 87   Studies: No results found.  Scheduled Meds: . enoxaparin (LOVENOX) injection  30 mg Subcutaneous Q24H  . nicotine  14 mg Transdermal Daily  . sodium chloride flush  5 mL Intracatheter Q8H   Continuous Infusions: . dextrose 5 % and 0.45 % NaCl with KCl 20 mEq/L 50 mL/hr at 08/17/17 1058  . meropenem (MERREM) IV    . vancomycin Stopped (08/17/17 1100)   PRN Meds: acetaminophen **OR** acetaminophen, guaiFENesin-dextromethorphan, HYDROmorphone (DILAUDID) injection, ondansetron **OR** ondansetron (ZOFRAN) IV  Time spent:  35 minutes   Author: Lynden Oxford, MD [facility] Pager: [phone] 08/17/2017 3:36 PM  If 7PM-7AM, please contact night-coverage at [email or web address], password [facility]

## 2017-08-17 NOTE — Progress Notes (Signed)
Patient systolic has been in the low 80's this hour .  Notified the MD on call.  Will continue to monitor the patient and notify as needed

## 2017-08-17 NOTE — Progress Notes (Signed)
Referring Physician(s): Dr Leodis Binet  Supervising Physician: Gilmer Mor  Patient Status:  [facility] - In-pt  Chief Complaint:  LLQ abscess drain placed 5/10  Subjective:  Much better today Less pain Tolerating ice chips Wbc down to 23  Allergies: Ciprofloxacin; Levofloxacin; Nickel; and Penicillins  Medications: Prior to Admission medications   Medication Sig Start Date End Date Taking? Authorizing Provider  acetaminophen (TYLENOL) 500 MG tablet Take 1,000 mg by mouth as needed for mild pain.   Yes [provider]  folic acid (FOLVITE) 1 MG tablet Take 1 tablet (1 mg total) by mouth daily. 06/26/17  Yes Burnadette Pop, MD  furosemide (LASIX) 40 MG tablet Take 1 tablet (40 mg total) by mouth daily. 06/27/17  Yes Burnadette Pop, MD  naproxen sodium (ALEVE) 220 MG tablet Take 440 mg by mouth daily as needed (pain).   Yes [provider]  Potassium Chloride ER 20 MEQ TBCR Take 20 mEq by mouth daily. 06/26/17  Yes Burnadette Pop, MD  spironolactone (ALDACTONE) 25 MG tablet Take 0.5 tablets (12.5 mg total) by mouth daily. 07/30/17 10/28/17 Yes Bensimhon, Bevelyn Buckles, MD  thiamine 100 MG tablet Take 1 tablet (100 mg total) by mouth daily. Patient not taking: Reported on 07/30/2017 06/26/17   Burnadette Pop, MD     Vital Signs: BP 135/81   Pulse (!) 115   Temp 98.1 F (36.7 C) (Oral)   Resp (!) 22   Ht  (1.575 m)   Wt 103 lb (46.7 kg)   SpO2 97%   BMI 18.84 kg/m   Physical Exam  Abdominal: Soft. Normal appearance. Bowel sounds are decreased. There is tenderness.  Tender diffusely NT at site of drain  Skin: Skin is warm and dry.  Site is clean and dry NT No bleeding Clear serous OP 45 cc yesterday 10 cc in JP ABUNDANT WBC PRESENT, PREDOMINANTLY PMN  ABUNDANT GRAM POSITIVE COCCI   Nursing note and vitals reviewed.   Imaging: Ct Abdomen Pelvis W Contrast  Result Date: 08/15/2017 CLINICAL DATA:  Generalized abdominal pain with hypertension EXAM:  CT ABDOMEN AND PELVIS WITH CONTRAST TECHNIQUE: Multidetector CT imaging of the abdomen and pelvis was performed using the standard protocol following bolus administration of intravenous contrast. CONTRAST:  75mL OMNIPAQUE IOHEXOL 300 MG/ML  SOLN COMPARISON:  04/07/2003 FINDINGS: Lower chest: Large right heart. Atherosclerotic calcification. No acute finding Hepatobiliary: Questionable lobulation of the liver surface. The caudate lobe is also large.No evidence of biliary obstruction or stone. Pancreas: Generalized atrophy. Spleen: Negative.  No definite enlargement. Adrenals/Urinary Tract: Negative adrenals. No hydronephrosis or stone. Unremarkable bladder. Stomach/Bowel: Evidence of prior sigmoidectomy. There is extensive colonic diverticulosis. Suspect appendectomy. The distal gastric wall is thickened by submucosal low-density. No discrete ulcer or mass is seen. Vascular/Lymphatic: Extensive atherosclerotic plaque on the aorta and visceral branches. Prominent mesenteric and upper retroperitoneal lymph nodes which may be related to the above. Reproductive:IUD in the atrophic uterus. Other: Overall moderate ascites mainly seen about the liver and in the pelvis where there is signs of loculation and peritoneal thickening/enhancement. Areas of greatest peritoneal thickening seen below the liver, in the ventral left upper quadrant, and along the left pelvic sidewall. There is also a multilobulated thick walled cystic collection in the left paracolic gutter measuring up to 9 x 8.4 cm. Regional inflammation is mild. An abscess was seen in this location in 2004. Musculoskeletal: T10 and T11 vertebral body fractures that were seen on prior CT. There is interval T12 inferior endplate,  L1 superior endplate, and L2 superior endplate fractures with mild depression. No retropulsion. These results were called by telephone at the time of interpretation on 08/15/2017 at 11:48 am to Dr. Buel Ream , who verbally acknowledged  these results. IMPRESSION: 1. 9 cm irregular cystic mass in the left paracolic gutter, favor abscess over cavitary neoplasm. No pneumoperitoneum or adjacent bowel perforation to explain this collection, suspect missed perforation that has subsequently sealed. It is noted that there was an abscess in this location in 2004 by CT. 2. Moderate ascites with loculation and peritonitis findings, presumably from the same. 3. Suspect cirrhosis.  Consider SBP as cause of above. 4. Distal gastritis. 5. Mild mesenteric and upper retroperitoneal adenopathy presumably reactive to the above. Suggest EGD after convalescence. 6. T12, L1, and L2 endplate fractures that have occurred since chest CT 06/25/2017. There are older endplate fractures at T10 and T11. Electronically Signed   By: Marnee Spring M.D.   On: 08/15/2017 12:00   Dg Chest Port 1 View  Result Date: 08/16/2017 CLINICAL DATA:  Shortness of breath and productive cough EXAM: PORTABLE CHEST 1 VIEW COMPARISON:  08/15/2017 FINDINGS: Cardiac shadow is enlarged accentuated by the portable technique. Aortic calcifications are again seen. Enlarging bilateral pleural effusions are noted right greater than left with associated basilar atelectasis. No pneumothorax is seen. IMPRESSION: Increasing basilar atelectasis and effusions. Electronically Signed   By: Alcide Clever M.D.   On: 08/16/2017 17:20   Dg Chest Port 1 View  Result Date: 08/15/2017 CLINICAL DATA:  Hypoxia. EXAM: PORTABLE CHEST 1 VIEW COMPARISON:  Chest x-ray dated July 28, 2017. FINDINGS: The heart size and mediastinal contours are within normal limits. Slightly increased peripheral interstitial markings are more conspicuous when compared to prior study. Possible trace left pleural effusion. Low lung volumes with bibasilar atelectasis. No consolidation or pneumothorax. No acute osseous abnormality. IMPRESSION: New mild interstitial edema and possible trace left pleural effusion. Electronically Signed   By:  Obie Dredge M.D.   On: 08/15/2017 10:54   Ir Image Guided Drainage Percut Cath  Peritoneal Retroperit  Result Date: 08/15/2017 INDICATION: 67 year old female with a history of prior colonic perforation and colon resection currently septic with evidence of a large abscess cavity in the left lower quadrant as well as ascites and evidence of peritoneal enhancement. She presents for percutaneous drain placement and paracentesis. EXAM: IR IMAGE GUIDED DRAINAGE PERCUT CATH PERITONEAL RETROPERIT; IR PARACENTESIS MEDICATIONS: The patient is currently admitted to the [facility] and receiving intravenous antibiotics. The antibiotics were administered within an appropriate time frame prior to the initiation of the procedure. ANESTHESIA/SEDATION: Fentanyl 25 mcg IV; Versed 0.5 mg IV Moderate Sedation Time:  17 minutes The patient was continuously monitored during the procedure by the interventional radiology nurse under my direct supervision. COMPLICATIONS: None immediate. PROCEDURE: Informed written consent was obtained from the patient after a thorough discussion of the procedural risks, benefits and alternatives. All questions were addressed. Maximal Sterile Barrier Technique was utilized including caps, mask, sterile gowns, sterile gloves, sterile drape, hand hygiene and skin antiseptic. A timeout was performed prior to the initiation of the procedure. Ultrasound was used to interrogate the left upper quadrant. Perihepatic ascites is evident. Local anesthesia was attained by infiltration with 1% lidocaine. Under real-time sonographic guidance, a 17 gauge 7 cm Yueh centesis catheter was carefully advanced into the fluid collection. An image was obtained and stored for the medical record. Paracentesis was then performed yielding approximately 400 mL of cloudy ascitic fluid. A sample was  sent for culture. Attention was next turned to the left flank. Ultrasound demonstrates a large complex fluid collection affiliated with  the left lower quadrant abdominal wall. Local anesthesia was again attained by infiltration with 1% lidocaine. A small dermatotomy was made. Under real-time sonographic guidance, a 10 French drainage catheter was advanced into the complex fluid collection using trocar technique. The catheter was advanced off the trocar and the locking pigtail loop was formed. Aspiration yields approximately 200 mL thick purulent fluid. The abscess cavity was flushed and the drainage catheter connected to JP bulb suction. The drain was then secured to the skin with 0 Prolene suture. IMPRESSION: 1. Paracentesis yields approximately 400 mL cloudy ascitic fluid highly concerning for bacterial peritonitis. 2. Percutaneous 12 French drain placement yielding approximately 200 mL of thick purulent fluid. Of note, the patient has a very thin body habitus in the drainage catheter is just deep to the skin surface. Given the superficial nature of the drain, inadvertent displacement/removal is a concern. Recommend extreme care when changing bandages. Signed, Sterling Big, MD [facility] Electronically Signed   By: Malachy Moan M.D.   On: 08/15/2017 17:23   Ir Paracentesis  Result Date: 08/15/2017 INDICATION: 67 year old female with a history of prior colonic perforation and colon resection currently septic with evidence of a large abscess cavity in the left lower quadrant as well as ascites and evidence of peritoneal enhancement. She presents for percutaneous drain placement and paracentesis. EXAM: IR IMAGE GUIDED DRAINAGE PERCUT CATH PERITONEAL RETROPERIT; IR PARACENTESIS MEDICATIONS: The patient is currently admitted to the [facility] and receiving intravenous antibiotics. The antibiotics were administered within an appropriate time frame prior to the initiation of the procedure. ANESTHESIA/SEDATION: Fentanyl 25 mcg IV; Versed 0.5 mg IV Moderate Sedation Time:  17 minutes  The patient was continuously monitored during the procedure by the interventional radiology nurse under my direct supervision. COMPLICATIONS: None immediate. PROCEDURE: Informed written consent was obtained from the patient after a thorough discussion of the procedural risks, benefits and alternatives. All questions were addressed. Maximal Sterile Barrier Technique was utilized including caps, mask, sterile gowns, sterile gloves, sterile drape, hand hygiene and skin antiseptic. A timeout was performed prior to the initiation of the procedure. Ultrasound was used to interrogate the left upper quadrant. Perihepatic ascites is evident. Local anesthesia was attained by infiltration with 1% lidocaine. Under real-time sonographic guidance, a 17 gauge 7 cm Yueh centesis catheter was carefully advanced into the fluid collection. An image was obtained and stored for the medical record. Paracentesis was then performed yielding approximately 400 mL of cloudy ascitic fluid. A sample was sent for culture. Attention was next turned to the left flank. Ultrasound demonstrates a large complex fluid collection affiliated with the left lower quadrant abdominal wall. Local anesthesia was again attained by infiltration with 1% lidocaine. A small dermatotomy was made. Under real-time sonographic guidance, a 10 French drainage catheter was advanced into the complex fluid collection using trocar technique. The catheter was advanced off the trocar and the locking pigtail loop was formed. Aspiration yields approximately 200 mL thick purulent fluid. The abscess cavity was flushed and the drainage catheter connected to JP bulb suction. The drain was then secured to the skin with 0 Prolene suture. IMPRESSION: 1. Paracentesis yields approximately 400 mL cloudy ascitic fluid highly concerning for bacterial peritonitis. 2. Percutaneous 12 French drain placement yielding approximately 200 mL of thick purulent fluid. Of note, the patient has a very  thin body habitus  in the drainage catheter is just deep to the skin surface. Given the superficial nature of the drain, inadvertent displacement/removal is a concern. Recommend extreme care when changing bandages. Signed, Sterling Big, MD [facility] Radiology Electronically Signed   By: Malachy Moan M.D.   On: 08/15/2017 17:23    Labs:  CBC: Recent Labs    08/15/17 1002 08/15/17 1032 08/15/17 1938 08/16/17 0452 08/17/17 0422  WBC 35.5*  --  38.3* 35.0* 23.3*  HGB 13.1 13.9 11.2* 11.0* 9.8*  HCT 38.3 41.0 34.5* 34.2* 30.4*  PLT 303  --  287 293 243    COAGS: Recent Labs    08/15/17 1938  INR 1.65    BMP: Recent Labs    06/26/17 0500 08/15/17 1002 08/15/17 1032 08/16/17 0452 08/17/17 0422  NA 142 141 139 142 139  K 3.1* 2.7* 3.0* 4.1 3.8  CL 103 94* 95* 100* 103  CO2 29 30  --  28 26  GLUCOSE 118* 67 78 45* 64*  BUN 14 37* 42* 46* 55*  CALCIUM 8.4* 9.2  --  9.1 9.1  CREATININE 0.49 1.20* 1.20* 1.42* 1.62*  GFRNONAA >60 45*  --  37* 31*  GFRAA >60 52*  --  43* 36*    LIVER FUNCTION TESTS: Recent Labs    06/24/17 2049 08/15/17 1002 08/16/17 0452 08/17/17 0422  BILITOT 0.7 3.0* 1.8* 1.6*  AST 43* 31 32 25  ALT 15 13* 12* 11*  ALKPHOS 181* 194* 150* 120  PROT 7.0 7.2 5.9* 5.8*  ALBUMIN 2.9* 2.3* 1.9* 2.4*    Assessment and Plan:  LLQ abscess drain intact Wbc coming down Less pain Serous OP Will follow  Electronically Signed: Ilianna Bown A, PA-C 08/17/2017, 10:01 AM   I spent a total of 15 Minutes at the the patient's bedside AND on the patient's [facility] floor or unit, greater than 50% of which was counseling/coordinating care for LLQ abscess drain

## 2017-08-17 NOTE — Significant Event (Signed)
Rapid Response Event Note:  Overview: Follow Up  Initial Focused Assessment: I came to follow up with patient at 2340. I noticed that patient the SBP was in the 80s. Patient was resting, she awaken to my voice, denied pain or discomfort, she stated she was comfortable. Patient was conversant. Skin warm and dry, +2 edema in BLE. Lung sounds were diminished, patient denied shortness of breath. HR in the 100-120s (has been tachycardiac), RR and SpO2 are normal.   Patient was getting Albumin 5% at 80cc/hr and is tolerating it well. Patient was hypoglycemia again, it was treated, and blood sugar improved.   Interventions: -- Ordered blood sugar check at 0200  Plan of Care (if not transferred): -- RN to call RRT if hypotension/respiratory status/hypoglycemia persist  Event Summary:    at     Arrival Time 2340 End Time 0000  [facility] R

## 2017-08-17 NOTE — Progress Notes (Signed)
ANTIBIOTIC CONSULT NOTE - Follow-Up  Pharmacy Consult for Vancomycin and Meropenem Indication: intra-abdominal infection  Patient Measurements: Height:  (157.5 cm) Weight: 103 lb (46.7 kg) IBW/kg (Calculated) : 50.1  Vital Signs: Temp: 98.1 F (36.7 C) (05/12 1214) Temp Source: Oral (05/12 1214) BP: 96/58 (05/12 1153) Pulse Rate: 101 (05/12 1153) Intake/Output from previous day: 05/11 0701 - 05/12 0700 In: 1200 [P.O.:440; I.V.:5; IV Piggyback:700] Out: 285 [Urine:240; Drains:45] Intake/Output from this shift: Total I/O In: 251.7 [I.V.:51.7; IV Piggyback:200] Out: -   Labs: Recent Labs    08/15/17 1032 08/15/17 1938 08/16/17 0452 08/17/17 0422  WBC  --  38.3* 35.0* 23.3*  HGB 13.9 11.2* 11.0* 9.8*  PLT  --  287 293 243  CREATININE 1.20*  --  1.42* 1.62*   Estimated Creatinine Clearance: 24.2 mL/min (A) (by C-G formula based on SCr of 1.62 mg/dL (H)). No results for input(s): VANCOTROUGH, VANCOPEAK, VANCORANDOM, GENTTROUGH, GENTPEAK, GENTRANDOM, TOBRATROUGH, TOBRAPEAK, TOBRARND, AMIKACINPEAK, AMIKACINTROU, AMIKACIN in the last 72 hours.   Microbiology: Recent Results (from the past 720 hour(s))  Blood Culture (routine x 2)     Status: None (Preliminary result)   Collection Time: 08/15/17 10:14 AM  Result Value Ref Range Status   Specimen Description BLOOD RIGHT ANTECUBITAL  Final   Special Requests   Final    BOTTLES DRAWN AEROBIC AND ANAEROBIC Blood Culture adequate volume   Culture   Final    NO GROWTH 2 DAYS Performed at [facility], [address], [place], [place] [address]    Report Status PENDING  Incomplete  Blood Culture (routine x 2)     Status: None (Preliminary result)   Collection Time: 08/15/17 10:51 AM  Result Value Ref Range Status   Specimen Description BLOOD LEFT WRIST  Final   Special Requests   Final    BOTTLES DRAWN AEROBIC AND ANAEROBIC Blood Culture adequate volume   Culture   Final    NO GROWTH 2 DAYS Performed at [facility], [address] [place], [place] [address]    Report Status PENDING  Incomplete  Aerobic/Anaerobic Culture (surgical/deep wound)     Status: None (Preliminary result)   Collection Time: 08/15/17  4:42 PM  Result Value Ref Range Status   Specimen Description ABSCESS  Final   Special Requests PARACENTESIS  Final   Gram Stain   Final    ABUNDANT WBC PRESENT, PREDOMINANTLY PMN ABUNDANT GRAM POSITIVE COCCI    Culture   Final    ABUNDANT STAPHYLOCOCCUS AUREUS HOLDING FOR POSSIBLE ANAEROBE Performed at [facility], [address] [place], [place] [address]    Report Status PENDING  Incomplete  MRSA PCR Screening     Status: None   Collection Time: 08/16/17  4:58 AM  Result Value Ref Range Status   MRSA by PCR NEGATIVE NEGATIVE Final    Comment:        The GeneXpert MRSA Assay (FDA approved for NASAL specimens only), is one component of a comprehensive MRSA colonization surveillance program. It is not intended to diagnose MRSA infection nor to guide or monitor treatment for MRSA infections. Performed at [facility], [address] [place], [place] [address]     Assessment: 67 yo female presented to ED on 4/10 with abdominal pain and hypotension. IR reported high likelihood of infected ascites c/w bacterial peritonitis and LLQ abscess with thick pus. Pharmacy consulted to start meropenem and vancomycin.   Renal function is worsening  slightly, SCr 1.62 << 1.42, CrCl~25 ml/min. Vanc dose okay, will reduce Meropenem slightly. Abscess cultures are growing staph aureus, still awaiting ascites cultures so broad coverage seems reasonable. Consider narrowing as able.   Goal of Therapy:  Vancomycin trough level 15-20 mcg/ml  Plan:  - Continue Vancomycin 500 mg every 24 hours - Adjust Meropenem to 500 mg IV every 12 hours - Will continue to follow renal function, culture results, LOT, and antibiotic de-escalation plans   Thank you for allowing pharmacy to  be a part of this patient's care.  Georgina Pillion, PharmD, BCPS Clinical Pharmacist Pager: [phone] Clinical phone for 08/17/2017 from 7a-3:30p: [phone] If after 3:30p, please call main pharmacy at: [phone] 08/17/2017 2:29 PM

## 2017-08-17 NOTE — Progress Notes (Signed)
Hypoglycemic Event  CBG: 56  Treatment: D50 IV 25 mL  Symptoms: Shaky  Follow-up CBG: Time:112 CBG Result:0622  Possible Reasons for Event: Other: NPO  Comments/MD notified: NP C. Bodenheimer    June Leap N Saifan Rayford

## 2017-08-18 ENCOUNTER — Inpatient Hospital Stay (HOSPITAL_COMMUNITY): Payer: Medicare Other

## 2017-08-18 DIAGNOSIS — R6520 Severe sepsis without septic shock: Secondary | ICD-10-CM

## 2017-08-18 DIAGNOSIS — J9600 Acute respiratory failure, unspecified whether with hypoxia or hypercapnia: Secondary | ICD-10-CM

## 2017-08-18 DIAGNOSIS — J90 Pleural effusion, not elsewhere classified: Secondary | ICD-10-CM

## 2017-08-18 DIAGNOSIS — J9601 Acute respiratory failure with hypoxia: Secondary | ICD-10-CM

## 2017-08-18 LAB — CBC
HEMATOCRIT: 31.8 % — AB (ref 36.0–46.0)
HEMOGLOBIN: 10.2 g/dL — AB (ref 12.0–15.0)
MCH: 26.4 pg (ref 26.0–34.0)
MCHC: 32.1 g/dL (ref 30.0–36.0)
MCV: 82.4 fL (ref 78.0–100.0)
Platelets: 229 10*3/uL (ref 150–400)
RBC: 3.86 MIL/uL — ABNORMAL LOW (ref 3.87–5.11)
RDW: 16.1 % — AB (ref 11.5–15.5)
WBC: 19.9 10*3/uL — ABNORMAL HIGH (ref 4.0–10.5)

## 2017-08-18 LAB — GLUCOSE, CAPILLARY
GLUCOSE-CAPILLARY: 92 mg/dL (ref 65–99)
GLUCOSE-CAPILLARY: 106 mg/dL — AB (ref 65–99)
Glucose-Capillary: 98 mg/dL (ref 65–99)
Glucose-Capillary: 88 mg/dL (ref 65–99)
Glucose-Capillary: 89 mg/dL (ref 65–99)

## 2017-08-18 LAB — BASIC METABOLIC PANEL
Anion gap: 9 (ref 5–15)
BUN: 43 mg/dL — AB (ref 6–20)
CALCIUM: 9.4 mg/dL (ref 8.9–10.3)
CHLORIDE: 107 mmol/L (ref 101–111)
CO2: 26 mmol/L (ref 22–32)
CREATININE: 0.8 mg/dL (ref 0.44–1.00)
GFR calc non Af Amer: >60 mL/min (ref >60)
GLUCOSE: 95 mg/dL (ref 65–99)
Potassium: 3.8 mmol/L (ref 3.5–5.1)
Sodium: 142 mmol/L (ref 135–145)

## 2017-08-18 LAB — HEPATITIS PANEL, ACUTE
HCV AB: 0.2 {s_co_ratio} (ref 0.0–0.9)
Hep A IgM: NEGATIVE
Hep B C IgM: NEGATIVE
Hepatitis B Surface Ag: NEGATIVE

## 2017-08-18 NOTE — Progress Notes (Signed)
Subjective/Chief Complaint: Having flatus, pain better   Objective: Vital signs in last 24 hours: Temp:  [98.1 F (36.7 C)-98.8 F (37.1 C)] 98.5 F (36.9 C) (05/13 1205) Pulse Rate:  [110-125] 125 (05/13 0356) Resp:  [17-22] 21 (05/13 0356) BP: (102-145)/(56-89) 107/73 (05/13 1200) SpO2:  [94 %-99 %] 99 % (05/13 0356) Last BM Date: 08/14/17  Intake/Output from previous day: 05/12 0701 - 05/13 0700 In: 1287.5 [I.V.:987.5; IV Piggyback:300] Out: 1080 [Urine:1050; Drains:30] Intake/Output this shift: Total I/O In: 465 [P.O.:60; I.V.:200; Other:5; IV Piggyback:200] Out: 260 [Urine:250; Drains:10]  GI: mild distended drain serous mostly some bs minimally tender  Lab Results:  Recent Labs    08/17/17 0422 08/18/17 0546  WBC 23.3* 19.9*  HGB 9.8* 10.2*  HCT 30.4* 31.8*  PLT 243 229   BMET Recent Labs    08/17/17 0422 08/18/17 0546  NA 139 142  K 3.8 3.8  CL 103 107  CO2 26 26  GLUCOSE 64* 95  BUN 55* 43*  CREATININE 1.62* 0.80  CALCIUM 9.1 9.4   PT/INR Recent Labs    08/15/17 1938  LABPROT 19.3*  INR 1.65   ABG No results for input(s): PHART, HCO3 in the last 72 hours.  Invalid input(s): PCO2, PO2  Studies/Results: Dg Chest Port 1 View  Result Date: 08/18/2017 CLINICAL DATA:  Pleural effusion.  Congestive heart failure. EXAM: PORTABLE CHEST 1 VIEW COMPARISON:  One-view chest x-ray 08/16/2017. FINDINGS: The heart is enlarged. Bilateral pleural effusions are present. Bibasilar airspace disease is stable. Perihilar soft tissue prominence is unchanged. The visualized soft tissues and bony thorax are otherwise unremarkable. IMPRESSION: 1. Stable appearance of bilateral pleural effusions and associated airspace disease, likely atelectasis. Electronically Signed   By: Marin Roberts M.D.   On: 08/18/2017 07:32   Dg Chest Port 1 View  Result Date: 08/16/2017 CLINICAL DATA:  Shortness of breath and productive cough EXAM: PORTABLE CHEST 1 VIEW  COMPARISON:  08/15/2017 FINDINGS: Cardiac shadow is enlarged accentuated by the portable technique. Aortic calcifications are again seen. Enlarging bilateral pleural effusions are noted right greater than left with associated basilar atelectasis. No pneumothorax is seen. IMPRESSION: Increasing basilar atelectasis and effusions. Electronically Signed   By: Alcide Clever M.D.   On: 08/16/2017 17:20    Anti-infectives: Anti-infectives (From admission, onward)   Start     Dose/Rate Route Frequency Ordered Stop   08/17/17 2030  meropenem (MERREM) 500 mg in sodium chloride 0.9 % 100 mL IVPB     500 mg 200 mL/hr over 30 Minutes Intravenous Every 12 hours 08/17/17 1429     08/17/17 0900  vancomycin (VANCOCIN) 500 mg in sodium chloride 0.9 % 100 mL IVPB     500 mg 100 mL/hr over 60 Minutes Intravenous Every 24 hours 08/16/17 0816     08/16/17 1200  cefTRIAXone (ROCEPHIN) 1 g in sodium chloride 0.9 % 100 mL IVPB  Status:  Discontinued     1 g 200 mL/hr over 30 Minutes Intravenous Every 24 hours 08/15/17 1351 08/16/17 0756   08/16/17 0830  vancomycin (VANCOCIN) IVPB 1000 mg/200 mL premix     1,000 mg 200 mL/hr over 60 Minutes Intravenous  Once 08/16/17 0816 08/16/17 1117   08/16/17 0830  meropenem (MERREM) 1 g in sodium chloride 0.9 % 100 mL IVPB  Status:  Discontinued     1 g 200 mL/hr over 30 Minutes Intravenous Every 12 hours 08/16/17 0816 08/17/17 1429   08/15/17 2200  metroNIDAZOLE (FLAGYL) IVPB 500 mg  Status:  Discontinued     500 mg 100 mL/hr over 60 Minutes Intravenous Every 8 hours 08/15/17 1351 08/16/17 0757   08/15/17 1115  cefTRIAXone (ROCEPHIN) 2 g in sodium chloride 0.9 % 100 mL IVPB     2 g 200 mL/hr over 30 Minutes Intravenous  Once 08/15/17 1113 08/15/17 1221   08/15/17 1115  metroNIDAZOLE (FLAGYL) IVPB 500 mg     500 mg 100 mL/hr over 60 Minutes Intravenous  Once 08/15/17 1113 08/15/17 1317      Assessment/Plan: Septic shock secondary to Intra-abdominal abscess (resolved  sepsis) Continue abx, can have clear liquids today, likely follow up ct scan next 48 hours, no indication for surgery Not entirely sure of etiology     Emelia Loron 08/18/2017

## 2017-08-18 NOTE — Progress Notes (Signed)
[facility] Progress Note  Patient: Rachel Williams EAV:[id number]   PCP: Sigmund Hazel, MD DOB: 1951/02/12   DOA: 08/15/2017   DOS: 08/18/2017   Date of Service: the patient was seen and examined on 08/18/2017  Subjective: More awake, no acute complaint.  Pain is better.  No nausea no vomiting.  Passing gas.  Brief hospital course: Pt. with PMH of chronic diastolic CHF, pulmonary hypertension, moderate to severe TR, active smoker, history of alcohol abuse, history of bowel perforation with colectomy; admitted on 08/15/2017, presented with complaint of abdominal pain, was found to have intra-abdominal abscess with sepsis. Currently further plan is continue current treatment.  Assessment and Plan: 1.  Sepsis with septic shock. Intra-abdominal abscess. Possible perforated diverticulitis. Presented with abdominal pain, CT scan of the abdomen showing moderate ascites with 9 cm irregular cystic mass in the left paracolic gutter abscess or cavitary neoplasm. No pneumoperitoneum. General surgery consulted recommend conservative management with IR guided drainage as well as drain placement. Drain placement performed on 08/15/2017. Paracentesis performed on 08/15/2017. Culture from the drain is growing staph aureus as well as anaerobe Blood cultures so far negative. Patient received aggressive IV hydration and become short of breath and required Lasix last night. Patient was started on IV ceftriaxone and Flagyl, for aggressive coverage in a patient with severe sepsis, transition to IV meropenem. Vancomycin added, also staph aureus growing in the abscess culture. MRSA PCR is negative. Critical care consulted for further assistance, started on IV albumin, with BP better, I will hold off albumin for now.  No pressors or transfer to ICU required. Discussed with surgery, recommend repeat CT scan in 48 hours and allowed to advance diet to clear liquid diet  2.  Acute on chronic diastolic  dysfunction. Severe pulmonary hypertension. Moderate to severe tricuspid regurgitation. Bilateral small pleural effusion Chest x-ray showed mild pulmonary edema and patient required 2 L of oxygen. Add incentive spirometry, no need for thoracentesis At present monitor.  No indication for Lasix for now.  3.  Acute kidney injury. Acute urinary retention Renal function worsened.  Urine output minimal.   Continue the Foley catheter, continue gentle hydration Monitor in and out. Renal function now normal  4.  Alcohol abuse. Possible cirrhosis based on the imaging. MELD score is 16, child class is class B. This assessment will likely change as the patient's primary issue of sepsis resolves anticipate improvement in patient's bilirubin level as well as creatinine level. No evidence of withdrawal for now.  Monitor.  5.  Compression fracture. Back pain. Recently hospitalized for the same. Requested by orthopedic surgery. Currently stable. Monitor.  6.  Active smoker. Nicotine patch provided. Patient was counseled for quit smoking.  7.  Hypokalemia. Replaced with IV. Monitor.  8.  Hypoglycemia. Due to poor p.o. intake. Replace with IV dextrose. Monitor.  9.  Sinus tachycardia. Due to sepsis. Monitor.  10.  Hypoalbuminemia. Due to cirrhosis. Also due to poor p.o. intake. Patient received IV albumin due to poor response to IV fluid resuscitation. We will monitor recommendation from critical care.  Diet: Clear liquid diet DVT Prophylaxis: subcutaneous Heparin  Advance goals of care discussion: full code  Family Communication: family was present at bedside, at the time of interview. The pt provided permission to discuss medical plan with the family. Opportunity was given to ask question and all questions were answered satisfactorily.   Disposition:  Discharge to be determined.  Consultants: General surgery PCCM IR Procedures: IR guided drain  placement. Ultrasound-guided paracentesis.  Antibiotics: Anti-infectives (From admission, onward)   Start     Dose/Rate Route Frequency Ordered Stop   08/17/17 2030  meropenem (MERREM) 500 mg in sodium chloride 0.9 % 100 mL IVPB     500 mg 200 mL/hr over 30 Minutes Intravenous Every 12 hours 08/17/17 1429     08/17/17 0900  vancomycin (VANCOCIN) 500 mg in sodium chloride 0.9 % 100 mL IVPB     500 mg 100 mL/hr over 60 Minutes Intravenous Every 24 hours 08/16/17 0816     08/16/17 1200  cefTRIAXone (ROCEPHIN) 1 g in sodium chloride 0.9 % 100 mL IVPB  Status:  Discontinued     1 g 200 mL/hr over 30 Minutes Intravenous Every 24 hours 08/15/17 1351 08/16/17 0756   08/16/17 0830  vancomycin (VANCOCIN) IVPB 1000 mg/200 mL premix     1,000 mg 200 mL/hr over 60 Minutes Intravenous  Once 08/16/17 0816 08/16/17 1117   08/16/17 0830  meropenem (MERREM) 1 g in sodium chloride 0.9 % 100 mL IVPB  Status:  Discontinued     1 g 200 mL/hr over 30 Minutes Intravenous Every 12 hours 08/16/17 0816 08/17/17 1429   08/15/17 2200  metroNIDAZOLE (FLAGYL) IVPB 500 mg  Status:  Discontinued     500 mg 100 mL/hr over 60 Minutes Intravenous Every 8 hours 08/15/17 1351 08/16/17 0757   08/15/17 1115  cefTRIAXone (ROCEPHIN) 2 g in sodium chloride 0.9 % 100 mL IVPB     2 g 200 mL/hr over 30 Minutes Intravenous  Once 08/15/17 1113 08/15/17 1221   08/15/17 1115  metroNIDAZOLE (FLAGYL) IVPB 500 mg     500 mg 100 mL/hr over 60 Minutes Intravenous  Once 08/15/17 1113 08/15/17 1317       Objective: Physical Exam: Vitals:   08/18/17 0358 08/18/17 0800 08/18/17 1200 08/18/17 1205  BP:  114/73 107/73   Pulse:      Resp:      Temp: 98.8 F (37.1 C) 98.6 F (37 C)  98.5 F (36.9 C)  TempSrc: Rectal Oral  Oral  SpO2:      Weight:      Height:        Intake/Output Summary (Last 24 hours) at 08/18/2017 1401 Last data filed at 08/18/2017 1043 Gross per 24 hour  Intake 1500.83 ml  Output 1340 ml  Net 160.83 ml    Filed Weights   08/15/17 1009 08/15/17 1846  Weight: 46.7 kg (103 lb) 46.7 kg (103 lb)   General: Alert, Awake and Oriented to Time, Place and Person. Appear in severe distress, affect flat Eyes: PERRL, Conjunctiva normal ENT: Oral Mucosa clear dry. Neck: difficult to assess JVD, no Abnormal Mass Or lumps Cardiovascular: S1 and S2 Present, no Murmur, Peripheral Pulses Present Respiratory: increased respiratory effort, Bilateral Air entry equal and Decreased, no use of accessory muscle, bilateral  Crackles, no wheezes Abdomen: Bowel Sound present, distended and diffuse mild to moderate tenderness, no hernia Skin: no redness, no Rash, no induration Extremities: no Pedal edema, no calf tenderness Neurologic: Grossly no focal neuro deficit. Bilaterally Equal motor strength  Data Reviewed: CBC: Recent Labs  Lab 08/15/17 1002 08/15/17 1032 08/15/17 1938 08/16/17 0452 08/17/17 0422 08/18/17 0546  WBC 35.5*  --  38.3* 35.0* 23.3* 19.9*  NEUTROABS 33.7*  --   --   --   --   --   HGB 13.1 13.9 11.2* 11.0* 9.8* 10.2*  HCT 38.3 41.0 34.5* 34.2* 30.4* 31.8*  MCV 83.4  --  83.3 84.0 81.1 82.4  PLT 303  --  287 293 243 229   Basic Metabolic Panel: Recent Labs  Lab 08/15/17 1002 08/15/17 1032 08/15/17 1938 08/16/17 0452 08/16/17 1554 08/17/17 0422 08/18/17 0546  NA 141 139  --  142  --  139 142  K 2.7* 3.0*  --  4.1  --  3.8 3.8  CL 94* 95*  --  100*  --  103 107  CO2 30  --   --  28  --  26 26  GLUCOSE 67 78  --  45*  --  64* 95  BUN 37* 42*  --  46*  --  55* 43*  CREATININE 1.20* 1.20*  --  1.42*  --  1.62* 0.80  CALCIUM 9.2  --   --  9.1  --  9.1 9.4  MG  --   --  1.9 1.9  --  1.9  --   PHOS  --   --   --   --  3.8  --   --     Liver Function Tests: Recent Labs  Lab 08/15/17 1002 08/16/17 0452 08/17/17 0422  AST 31 32 25  ALT 13* 12* 11*  ALKPHOS 194* 150* 120  BILITOT 3.0* 1.8* 1.6*  PROT 7.2 5.9* 5.8*  ALBUMIN 2.3* 1.9* 2.4*   Recent Labs  Lab  08/15/17 1002  LIPASE 18   No results for input(s): AMMONIA in the last 168 hours. Coagulation Profile: Recent Labs  Lab 08/15/17 1938  INR 1.65   Cardiac Enzymes: No results for input(s): CKTOTAL, CKMB, CKMBINDEX, TROPONINI in the last 168 hours. BNP (last 3 results) No results for input(s): PROBNP in the last 8760 hours. CBG: Recent Labs  Lab 08/17/17 2023 08/17/17 2355 08/18/17 0344 08/18/17 0759 08/18/17 1204  GLUCAP 95 104* 98 92 88   Studies: Dg Chest Port 1 View  Result Date: 08/18/2017 CLINICAL DATA:  Pleural effusion.  Congestive heart failure. EXAM: PORTABLE CHEST 1 VIEW COMPARISON:  One-view chest x-ray 08/16/2017. FINDINGS: The heart is enlarged. Bilateral pleural effusions are present. Bibasilar airspace disease is stable. Perihilar soft tissue prominence is unchanged. The visualized soft tissues and bony thorax are otherwise unremarkable. IMPRESSION: 1. Stable appearance of bilateral pleural effusions and associated airspace disease, likely atelectasis. Electronically Signed   By: Marin Roberts M.D.   On: 08/18/2017 07:32    Scheduled Meds: . enoxaparin (LOVENOX) injection  30 mg Subcutaneous Q24H  . nicotine  14 mg Transdermal Daily  . sodium chloride flush  5 mL Intracatheter Q8H   Continuous Infusions: . dextrose 5 % and 0.45 % NaCl with KCl 20 mEq/L 50 mL/hr at 08/18/17 0511  . meropenem (MERREM) IV Stopped (08/18/17 1043)  . vancomycin Stopped (08/18/17 1043)   PRN Meds: acetaminophen **OR** acetaminophen, guaiFENesin-dextromethorphan, HYDROmorphone (DILAUDID) injection, ondansetron **OR** ondansetron (ZOFRAN) IV  Time spent:  35 minutes   Author: Lynden Oxford, MD [facility] Pager: [phone] 08/18/2017 2:01 PM  If 7PM-7AM, please contact night-coverage at [email or web address], password [facility]

## 2017-08-18 NOTE — Progress Notes (Signed)
Name: Rachel Williams MRN: [id number] DOB: 08-Dec-1950    ADMISSION DATE:  08/15/2017 CONSULTATION DATE:  08/16/2017  Brief HPI:   67 y/o female with COPD, Cirrhosis, Tobacco and alcohol use, diastolic HF, PH, mod to sever TR,  h/o left sided intraabdominal abscess in 2012 required drainage then, h/o colon resection, colostomy in the past.  Presented to ED 5/10 with abdominal pain, CT abdomen showed large (9cm) lefft sided complex abscess, drained by IR- ascitic fluid was cloudy, abscess fluid was purulent, On broad antibx- vanco+ merem, Gen. Surgery felt- pt is high risk for surgical exploration but would need it if she fails medical  Management.  5/11-> she had an episode of lethargy, transient hypotension- systolic 60, responded to 1 lit ns and 12.5 g albumin.  SUBJECTIVE:  No events overnight, feeling better  VITAL SIGNS: Temp:  [98.1 F (36.7 C)-98.8 F (37.1 C)] 98.5 F (36.9 C) (05/13 1205) Pulse Rate:  [110-125] 125 (05/13 0356) Resp:  [17-22] 21 (05/13 0356) BP: (102-145)/(56-89) 107/73 (05/13 1200) SpO2:  [94 %-99 %] 99 % (05/13 0356)  PHYSICAL EXAMINATION: General:  Chronically ill appearing cachectic female Neuro:  Alert and interactive, moving all ext to command HEENT:  [place]/AT, PERRL, EOM-I and MMM Cardiovascular:  RRR, Nl S1/S2 and -M/R/G. Lungs:  Decreased BS at the bases with bibasilar crackles Abdomen:  Soft, distended, ascites, NT and +BS Musculoskeletal:  -edema and -tenderness Skin:  Intact  Recent Labs  Lab 08/16/17 0452 08/17/17 0422 08/18/17 0546  NA 142 139 142  K 4.1 3.8 3.8  CL 100* 103 107  CO2 BUN 46* 55* 43*  CREATININE 1.42* 1.62* 0.80  GLUCOSE 45* 64* 95   Recent Labs  Lab 08/16/17 0452 08/17/17 0422 08/18/17 0546  HGB 11.0* 9.8* 10.2*  HCT 34.2* 30.4* 31.8*  WBC 35.0* 23.3* 19.9*  PLT 293 243 229   Dg Chest Port 1 View  Result Date: 08/18/2017 CLINICAL DATA:  Pleural effusion.  Congestive heart failure. EXAM: PORTABLE  CHEST 1 VIEW COMPARISON:  One-view chest x-ray 08/16/2017. FINDINGS: The heart is enlarged. Bilateral pleural effusions are present. Bibasilar airspace disease is stable. Perihilar soft tissue prominence is unchanged. The visualized soft tissues and bony thorax are otherwise unremarkable. IMPRESSION: 1. Stable appearance of bilateral pleural effusions and associated airspace disease, likely atelectasis. Electronically Signed   By: Marin Roberts M.D.   On: 08/18/2017 07:32   POCUS-08-16-17 IVC full; non collapsible;  Good LV contractility; no pericardial effusion. RV enlarged (known). R-small pleural effusion. L- small area of atelectasis No free fluid In LLQ,RLG  CT scan abdomen    08-15-2017 1. 9 cm irregular cystic mass in the left paracolic gutter, favor abscess over cavitary neoplasm. No pneumoperitoneum or adjacent bowel perforation to explain this collection, suspect missed perforation that has subsequently sealed. It is noted that there was an abscess in this location in 2004 by CT. 2. Moderate ascites with loculation and peritonitis findings, presumably from the same. 3. Suspect cirrhosis. Consider SBP as cause of above. 4. Distal gastritis. 5. Mild mesenteric and upper retroperitoneal adenopathy presumably reactive to the above. Suggest EGD after convalescence. 6. T12, L1, and L2 endplate fractures that have occurred since chest CT 06/25/2017. There are older endplate fractures at T10 and T11.  IR 08-15-2017 1. Paracentesis yields approximately 400 mL cloudy ascitic fluid highly concerning for bacterial peritonitis. 2. Percutaneous 12 French drain placement yielding approximately 200 mL of thick purulent fluid.   Ekg 08-15-17  sinus tachycardia, RBBB  Xray  Chest  08-16-2017 Bilateral pleural effusions, pulm vascular congestion  Cultures:  5/11 MRSA PCR >> neg 5/10 abd abscess cx >> MRSA  5/10 BC x2 >> ngtd  Antibiotics:  Ceftriaxone 5/11 x 1 Meropenem 5/11 >>  5/12 Vancomycin 5/11 >>   67   Y/o   female       With    H/o  Cirrhosis, LLQ abscess, peritonitis, severe sepsis, leukocytosis; Hypoalbuminemia, malnutrition, low BMI, tolerating lower MAP, (cirrhotics tend to have Low BP due to low SVR)  Assessment and Plan:  67 year old female with history of cirrhosis who presents to PCCM with septic shock from LLQ abdominal abscess drained by IR.  Patient was fluid resuscitated and BP is now improved.  Broad spectrum abx ordered.  Patient has very poor liver function and has significant ascites.  On exam, distended abdomen and decreased BS at the bases.  I reviewed CXR myself, bilateral pleural effusions noted as well as compressive atelectasis.  Discussed with PCCM-NP.  Hypotension: patient's baseline is high 90s to low 100s, classic with liver disease.             - Hold further IVF once able to take PO             - Tolerate SBP of 90 as long as mental status remains intact             - No need for further lactic acid checks  Sepsis:             - Vanc             - Merrem             - F/u on cultures - MRSA             - Continue drainage of nidus of infection  Pleural effusion: chronic             - No thora at this time             - Thora only if develops respiratory failure  Cirrhosis:             - Avoid tylenol given liver failure             - No need for para at this time  PCCM will sign off, please call back if needed.  Patient seen and examined, agree with above note.  I dictated the care and orders written for this patient under my direction.  Alyson Reedy, MD [phone]

## 2017-08-18 NOTE — Progress Notes (Signed)
Referring Physician(s): Dr. Corliss Skains  Supervising Physician: Irish Lack  Patient Status:  [facility] - In-pt  Chief Complaint: Hydrothorax  Subjective: Resting comfortably. No issues related to drain.   Allergies: Ciprofloxacin; Levofloxacin; Nickel; and Penicillins  Medications: Prior to Admission medications   Medication Sig Start Date End Date Taking? Authorizing Provider  acetaminophen (TYLENOL) 500 MG tablet Take 1,000 mg by mouth as needed for mild pain.   Yes [provider]  folic acid (FOLVITE) 1 MG tablet Take 1 tablet (1 mg total) by mouth daily. 06/26/17  Yes Burnadette Pop, MD  furosemide (LASIX) 40 MG tablet Take 1 tablet (40 mg total) by mouth daily. 06/27/17  Yes Burnadette Pop, MD  naproxen sodium (ALEVE) 220 MG tablet Take 440 mg by mouth daily as needed (pain).   Yes [provider]  Potassium Chloride ER 20 MEQ TBCR Take 20 mEq by mouth daily. 06/26/17  Yes Burnadette Pop, MD  spironolactone (ALDACTONE) 25 MG tablet Take 0.5 tablets (12.5 mg total) by mouth daily. 07/30/17 10/28/17 Yes Bensimhon, Bevelyn Buckles, MD  thiamine 100 MG tablet Take 1 tablet (100 mg total) by mouth daily. Patient not taking: Reported on 07/30/2017 06/26/17   Burnadette Pop, MD     Vital Signs: BP 107/73   Pulse (!) 125   Temp 98.5 F (36.9 C) (Oral)   Resp (!) 21   Ht  (1.575 m)   Wt 103 lb (46.7 kg)   SpO2 99%   BMI 18.84 kg/m   Physical Exam  Constitutional: She appears well-developed.  Nursing note and vitals reviewed. Abdomen:  Soft, non-distended.  Nontender.  Drain in place.  Insertion site c/d/i.  Draining more serous fluid, 30-45 mL daily.   Imaging: Ct Abdomen Pelvis W Contrast  Result Date: 08/15/2017 CLINICAL DATA:  Generalized abdominal pain with hypertension EXAM: CT ABDOMEN AND PELVIS WITH CONTRAST TECHNIQUE: Multidetector CT imaging of the abdomen and pelvis was performed using the standard protocol following bolus administration of  intravenous contrast. CONTRAST:  75mL OMNIPAQUE IOHEXOL 300 MG/ML  SOLN COMPARISON:  04/07/2003 FINDINGS: Lower chest: Large right heart. Atherosclerotic calcification. No acute finding Hepatobiliary: Questionable lobulation of the liver surface. The caudate lobe is also large.No evidence of biliary obstruction or stone. Pancreas: Generalized atrophy. Spleen: Negative.  No definite enlargement. Adrenals/Urinary Tract: Negative adrenals. No hydronephrosis or stone. Unremarkable bladder. Stomach/Bowel: Evidence of prior sigmoidectomy. There is extensive colonic diverticulosis. Suspect appendectomy. The distal gastric wall is thickened by submucosal low-density. No discrete ulcer or mass is seen. Vascular/Lymphatic: Extensive atherosclerotic plaque on the aorta and visceral branches. Prominent mesenteric and upper retroperitoneal lymph nodes which may be related to the above. Reproductive:IUD in the atrophic uterus. Other: Overall moderate ascites mainly seen about the liver and in the pelvis where there is signs of loculation and peritoneal thickening/enhancement. Areas of greatest peritoneal thickening seen below the liver, in the ventral left upper quadrant, and along the left pelvic sidewall. There is also a multilobulated thick walled cystic collection in the left paracolic gutter measuring up to 9 x 8.4 cm. Regional inflammation is mild. An abscess was seen in this location in 2004. Musculoskeletal: T10 and T11 vertebral body fractures that were seen on prior CT. There is interval T12 inferior endplate, L1 superior endplate, and L2 superior endplate fractures with mild depression. No retropulsion. These results were called by telephone at the time of interpretation on 08/15/2017 at 11:48 am to Dr. Buel Ream , who verbally acknowledged these results. IMPRESSION: 1.  9 cm irregular cystic mass in the left paracolic gutter, favor abscess over cavitary neoplasm. No pneumoperitoneum or adjacent bowel perforation to  explain this collection, suspect missed perforation that has subsequently sealed. It is noted that there was an abscess in this location in 2004 by CT. 2. Moderate ascites with loculation and peritonitis findings, presumably from the same. 3. Suspect cirrhosis.  Consider SBP as cause of above. 4. Distal gastritis. 5. Mild mesenteric and upper retroperitoneal adenopathy presumably reactive to the above. Suggest EGD after convalescence. 6. T12, L1, and L2 endplate fractures that have occurred since chest CT 06/25/2017. There are older endplate fractures at T10 and T11. Electronically Signed   By: Marnee Spring M.D.   On: 08/15/2017 12:00   Dg Chest Port 1 View  Result Date: 08/18/2017 CLINICAL DATA:  Pleural effusion.  Congestive heart failure. EXAM: PORTABLE CHEST 1 VIEW COMPARISON:  One-view chest x-ray 08/16/2017. FINDINGS: The heart is enlarged. Bilateral pleural effusions are present. Bibasilar airspace disease is stable. Perihilar soft tissue prominence is unchanged. The visualized soft tissues and bony thorax are otherwise unremarkable. IMPRESSION: 1. Stable appearance of bilateral pleural effusions and associated airspace disease, likely atelectasis. Electronically Signed   By: Marin Roberts M.D.   On: 08/18/2017 07:32   Dg Chest Port 1 View  Result Date: 08/16/2017 CLINICAL DATA:  Shortness of breath and productive cough EXAM: PORTABLE CHEST 1 VIEW COMPARISON:  08/15/2017 FINDINGS: Cardiac shadow is enlarged accentuated by the portable technique. Aortic calcifications are again seen. Enlarging bilateral pleural effusions are noted right greater than left with associated basilar atelectasis. No pneumothorax is seen. IMPRESSION: Increasing basilar atelectasis and effusions. Electronically Signed   By: Alcide Clever M.D.   On: 08/16/2017 17:20   Dg Chest Port 1 View  Result Date: 08/15/2017 CLINICAL DATA:  Hypoxia. EXAM: PORTABLE CHEST 1 VIEW COMPARISON:  Chest x-ray dated July 28, 2017.  FINDINGS: The heart size and mediastinal contours are within normal limits. Slightly increased peripheral interstitial markings are more conspicuous when compared to prior study. Possible trace left pleural effusion. Low lung volumes with bibasilar atelectasis. No consolidation or pneumothorax. No acute osseous abnormality. IMPRESSION: New mild interstitial edema and possible trace left pleural effusion. Electronically Signed   By: Obie Dredge M.D.   On: 08/15/2017 10:54   Ir Image Guided Drainage Percut Cath  Peritoneal Retroperit  Result Date: 08/15/2017 INDICATION: 67 year old female with a history of prior colonic perforation and colon resection currently septic with evidence of a large abscess cavity in the left lower quadrant as well as ascites and evidence of peritoneal enhancement. She presents for percutaneous drain placement and paracentesis. EXAM: IR IMAGE GUIDED DRAINAGE PERCUT CATH PERITONEAL RETROPERIT; IR PARACENTESIS MEDICATIONS: The patient is currently admitted to the hospital and receiving intravenous antibiotics. The antibiotics were administered within an appropriate time frame prior to the initiation of the procedure. ANESTHESIA/SEDATION: Fentanyl 25 mcg IV; Versed 0.5 mg IV Moderate Sedation Time:  17 minutes The patient was continuously monitored during the procedure by the interventional radiology nurse under my direct supervision. COMPLICATIONS: None immediate. PROCEDURE: Informed written consent was obtained from the patient after a thorough discussion of the procedural risks, benefits and alternatives. All questions were addressed. Maximal Sterile Barrier Technique was utilized including caps, mask, sterile gowns, sterile gloves, sterile drape, hand hygiene and skin antiseptic. A timeout was performed prior to the initiation of the procedure. Ultrasound was used to interrogate the left upper quadrant. Perihepatic ascites is evident. Local anesthesia was attained  by infiltration  with 1% lidocaine. Under real-time sonographic guidance, a 17 gauge 7 cm Yueh centesis catheter was carefully advanced into the fluid collection. An image was obtained and stored for the medical record. Paracentesis was then performed yielding approximately 400 mL of cloudy ascitic fluid. A sample was sent for culture. Attention was next turned to the left flank. Ultrasound demonstrates a large complex fluid collection affiliated with the left lower quadrant abdominal wall. Local anesthesia was again attained by infiltration with 1% lidocaine. A small dermatotomy was made. Under real-time sonographic guidance, a 10 French drainage catheter was advanced into the complex fluid collection using trocar technique. The catheter was advanced off the trocar and the locking pigtail loop was formed. Aspiration yields approximately 200 mL thick purulent fluid. The abscess cavity was flushed and the drainage catheter connected to JP bulb suction. The drain was then secured to the skin with 0 Prolene suture. IMPRESSION: 1. Paracentesis yields approximately 400 mL cloudy ascitic fluid highly concerning for bacterial peritonitis. 2. Percutaneous 12 French drain placement yielding approximately 200 mL of thick purulent fluid. Of note, the patient has a very thin body habitus in the drainage catheter is just deep to the skin surface. Given the superficial nature of the drain, inadvertent displacement/removal is a concern. Recommend extreme care when changing bandages. Signed, Sterling Big, MD [facility] Electronically Signed   By: Malachy Moan M.D.   On: 08/15/2017 17:23   Ir Paracentesis  Result Date: 08/15/2017 INDICATION: 67 year old female with a history of prior colonic perforation and colon resection currently septic with evidence of a large abscess cavity in the left lower quadrant as well as ascites and evidence of peritoneal enhancement. She  presents for percutaneous drain placement and paracentesis. EXAM: IR IMAGE GUIDED DRAINAGE PERCUT CATH PERITONEAL RETROPERIT; IR PARACENTESIS MEDICATIONS: The patient is currently admitted to the hospital and receiving intravenous antibiotics. The antibiotics were administered within an appropriate time frame prior to the initiation of the procedure. ANESTHESIA/SEDATION: Fentanyl 25 mcg IV; Versed 0.5 mg IV Moderate Sedation Time:  17 minutes The patient was continuously monitored during the procedure by the interventional radiology nurse under my direct supervision. COMPLICATIONS: None immediate. PROCEDURE: Informed written consent was obtained from the patient after a thorough discussion of the procedural risks, benefits and alternatives. All questions were addressed. Maximal Sterile Barrier Technique was utilized including caps, mask, sterile gowns, sterile gloves, sterile drape, hand hygiene and skin antiseptic. A timeout was performed prior to the initiation of the procedure. Ultrasound was used to interrogate the left upper quadrant. Perihepatic ascites is evident. Local anesthesia was attained by infiltration with 1% lidocaine. Under real-time sonographic guidance, a 17 gauge 7 cm Yueh centesis catheter was carefully advanced into the fluid collection. An image was obtained and stored for the medical record. Paracentesis was then performed yielding approximately 400 mL of cloudy ascitic fluid. A sample was sent for culture. Attention was next turned to the left flank. Ultrasound demonstrates a large complex fluid collection affiliated with the left lower quadrant abdominal wall. Local anesthesia was again attained by infiltration with 1% lidocaine. A small dermatotomy was made. Under real-time sonographic guidance, a 10 French drainage catheter was advanced into the complex fluid collection using trocar technique. The catheter was advanced off the trocar and the locking pigtail loop was formed. Aspiration  yields approximately 200 mL thick purulent fluid. The abscess cavity was flushed and the drainage catheter connected to JP bulb suction. The drain  was then secured to the skin with 0 Prolene suture. IMPRESSION: 1. Paracentesis yields approximately 400 mL cloudy ascitic fluid highly concerning for bacterial peritonitis. 2. Percutaneous 12 French drain placement yielding approximately 200 mL of thick purulent fluid. Of note, the patient has a very thin body habitus in the drainage catheter is just deep to the skin surface. Given the superficial nature of the drain, inadvertent displacement/removal is a concern. Recommend extreme care when changing bandages. Signed, Sterling Big, MD [facility] Radiology Electronically Signed   By: Malachy Moan M.D.   On: 08/15/2017 17:23    Labs:  CBC: Recent Labs    08/15/17 1938 08/16/17 0452 08/17/17 0422 08/18/17 0546  WBC 38.3* 35.0* 23.3* 19.9*  HGB 11.2* 11.0* 9.8* 10.2*  HCT 34.5* 34.2* 30.4* 31.8*  PLT 287 293 243 229    COAGS: Recent Labs    08/15/17 1938  INR 1.65    BMP: Recent Labs    08/15/17 1002 08/15/17 1032 08/16/17 0452 08/17/17 0422 08/18/17 0546  NA 141 139 142 139 142  K 2.7* 3.0* 4.1 3.8 3.8  CL 94* 95* 100* 103 107  CO2 30  --  GLUCOSE 67 78 45* 64* 95  BUN 37* 42* 46* 55* 43*  CALCIUM 9.2  --  9.1 9.1 9.4  CREATININE 1.20* 1.20* 1.42* 1.62* 0.80  GFRNONAA 45*  --  37* 31* >60  GFRAA 52*  --  43* 36* >60    LIVER FUNCTION TESTS: Recent Labs    06/24/17 2049 08/15/17 1002 08/16/17 0452 08/17/17 0422  BILITOT 0.7 3.0* 1.8* 1.6*  AST 43* 31 32 25  ALT 15 13* 12* 11*  ALKPHOS 181* 194* 150* 120  PROT 7.0 7.2 5.9* 5.8*  ALBUMIN 2.9* 2.3* 1.9* 2.4*    Assessment and Plan: Intra-abdominal fluid collection Patient s/p drain placement 5/10. WBC improved to 19.9K today.  Afebrile.  Cultures positive for GPC.  Draining more serous-type  fluid now.  Continue current management.  IR following.   Electronically Signed: Hoyt Koch, PA 08/18/2017, 2:21 PM   I spent a total of 15 Minutes at the the patient's bedside AND on the patient's hospital floor or unit, greater than 50% of which was counseling/coordinating care for intra-abdominal fluid collection.

## 2017-08-18 NOTE — Care Management Note (Addendum)
Case Management Note  Patient Details  Name: Rachel Williams MRN: [id number] Date of Birth: 01/21/1951  Subjective/Objective: History of chronic diastolic CHF, pulmonary hypertension, moderate to severe TR, active smoker, alcohol abuse, bowel perforation with colectomy; admitted on 08/15/2017 found to have intra-abdominal abscess with sepsis.                 Action/Plan: Prior to admission patient lived at [facility]. PCP noted. NCM will continue to follow for discharge transition needs.  Expected Discharge Date:    To be determined.              Expected Discharge Plan:  Home w Home Health Services  In-House Referral:  Clinical Social Work  Discharge planning Services  CM Consult  Status of Service:  In process, will continue to follow  Yancey Flemings, RN 08/18/2017, 2:48 PM

## 2017-08-19 DIAGNOSIS — K7460 Unspecified cirrhosis of liver: Secondary | ICD-10-CM

## 2017-08-19 DIAGNOSIS — K651 Peritoneal abscess: Secondary | ICD-10-CM

## 2017-08-19 LAB — GLUCOSE, CAPILLARY
GLUCOSE-CAPILLARY: 102 mg/dL — AB (ref 65–99)
GLUCOSE-CAPILLARY: 83 mg/dL (ref 65–99)
GLUCOSE-CAPILLARY: 83 mg/dL (ref 65–99)
GLUCOSE-CAPILLARY: 93 mg/dL (ref 65–99)
Glucose-Capillary: 86 mg/dL (ref 65–99)
Glucose-Capillary: 100 mg/dL — ABNORMAL HIGH (ref 65–99)

## 2017-08-19 LAB — VANCOMYCIN, TROUGH: VANCOMYCIN TR: 7 ug/mL — AB (ref 15–20)

## 2017-08-19 MED ORDER — HYDROMORPHONE HCL 1 MG/ML IJ SOLN: 0.5000 mg | INTRAMUSCULAR | Status: DC | PRN

## 2017-08-19 MED ORDER — DEXTROSE 5 % IV SOLN
1000.0000 mg | Freq: Four times a day (QID) | INTRAVENOUS | Status: DC | PRN
  Filled 2017-08-19: qty 10

## 2017-08-19 MED ORDER — BENZONATATE 100 MG PO CAPS
100.0000 mg | ORAL_CAPSULE | Freq: Two times a day (BID) | ORAL | Status: DC
  Administered 2017-08-19 – 2017-08-29 (×20): 100 mg via ORAL
  Filled 2017-08-19 (×20): qty 1

## 2017-08-19 MED ORDER — GUAIFENESIN ER 600 MG PO TB12
600.0000 mg | ORAL_TABLET | Freq: Two times a day (BID) | ORAL | Status: DC
  Administered 2017-08-19 – 2017-08-29 (×20): 600 mg via ORAL
  Filled 2017-08-19 (×20): qty 1

## 2017-08-19 MED ORDER — GABAPENTIN 300 MG PO CAPS
300.0000 mg | ORAL_CAPSULE | Freq: Every day | ORAL | Status: AC
  Administered 2017-08-19 – 2017-08-21 (×3): 300 mg via ORAL
  Filled 2017-08-19 (×3): qty 1

## 2017-08-19 MED ORDER — MUPIROCIN 2 % EX OINT
1.0000 "application " | TOPICAL_OINTMENT | Freq: Two times a day (BID) | CUTANEOUS | Status: AC
  Administered 2017-08-19 – 2017-08-23 (×10): 1 via NASAL
  Filled 2017-08-19 (×5): qty 22

## 2017-08-19 MED ORDER — METHOCARBAMOL 500 MG PO TABS: 1000.0000 mg | ORAL_TABLET | Freq: Four times a day (QID) | ORAL | Status: DC | PRN

## 2017-08-19 MED ORDER — OXYCODONE HCL 5 MG PO TABS
5.0000 mg | ORAL_TABLET | ORAL | Status: DC | PRN
  Administered 2017-08-19: 10 mg via ORAL
  Administered 2017-08-21: 5 mg via ORAL
  Administered 2017-08-22 – 2017-08-28 (×13): 10 mg via ORAL
  Filled 2017-08-19 (×9): qty 2
  Filled 2017-08-19: qty 1
  Filled 2017-08-19 (×5): qty 2

## 2017-08-19 MED ORDER — CHLORHEXIDINE GLUCONATE CLOTH 2 % EX PADS
6.0000 | MEDICATED_PAD | Freq: Every day | CUTANEOUS | Status: AC
  Administered 2017-08-19 – 2017-08-23 (×5): 6 via TOPICAL

## 2017-08-19 MED ORDER — VANCOMYCIN HCL IN DEXTROSE 750-5 MG/150ML-% IV SOLN
750.0000 mg | Freq: Two times a day (BID) | INTRAVENOUS | Status: DC
Start: 2017-08-19 — End: 2017-08-26
  Administered 2017-08-19 – 2017-08-26 (×15): 750 mg via INTRAVENOUS
  Filled 2017-08-19 (×17): qty 150

## 2017-08-19 NOTE — Progress Notes (Signed)
[facility] Progress Note  Patient: Rachel Williams MWU:[id number]   PCP: Sigmund Hazel, MD DOB: 1951/03/17   DOA: 08/15/2017   DOS: 08/19/2017   Date of Service: the patient was seen and examined on 08/19/2017  Subjective: Pain continues to remain better.  No nausea no vomiting.  Passing gas but no bowel movement.  Brief hospital course: Pt. with PMH of chronic diastolic CHF, pulmonary hypertension, moderate to severe TR, active smoker, history of alcohol abuse, history of bowel perforation with colectomy; admitted on 08/15/2017, presented with complaint of abdominal pain, was found to have intra-abdominal abscess with sepsis. Currently further plan is continue IV antibiotics  Assessment and Plan: 1.  Sepsis with septic shock. Intra-abdominal abscess. Possible perforated diverticulitis. Presented with abdominal pain, CT scan of the abdomen showing moderate ascites with 9 cm irregular cystic mass in the left paracolic gutter abscess or cavitary neoplasm. No pneumoperitoneum. General surgery consulted recommend conservative management with IR guided drainage as well as drain placement. Drain placement performed on 08/15/2017. Paracentesis performed on 08/15/2017. Culture from the drain is growing staph aureus as well as anaerobe Blood cultures so far negative. Patient received aggressive IV hydration and become short of breath and required Lasix Patient was started on IV ceftriaxone and Flagyl, for aggressive coverage in a patient with severe sepsis, transition to IV meropenem. Vancomycin added, also staph aureus growing in the abscess culture. MRSA PCR is negative. Critical care consulted for further assistance, started on IV albumin, with BP better, I will hold off albumin for now.  No pressors or transfer to ICU required. Discussed with surgery, recommend repeat CT scan tomorrow and allowed to advance diet to full liquid diet. Antibiotic narrowed to vancomycin only.  Discussed with  infectious disease Dr. Ninetta Lights, no indication for gram-negative or anaerobic coverage at present based on the culture.  When patient is ready transition to oral Bactrim.  2.  Acute on chronic diastolic dysfunction. Severe pulmonary hypertension. Moderate to severe tricuspid regurgitation. Bilateral small pleural effusion Chest x-ray showed mild pulmonary edema and patient required 2 L of oxygen. Add incentive spirometry, no need for thoracentesis At present monitor.  No indication for Lasix for now. Add flutter device, Mucinex  3.  Acute kidney injury. Acute urinary retention Renal function worsened.  Urine output minimal.   Continue the Foley catheter, continue gentle hydration Monitor in and out. Renal function now normal Once pain is well controlled, attempt voiding trial.  4.  Alcohol abuse. Possible cirrhosis based on the imaging. MELD score is 16, child class is class B. This assessment will likely change as the patient's primary issue of sepsis resolves anticipate improvement in patient's bilirubin level as well as creatinine level. No evidence of withdrawal for now.  Monitor.  5.  Compression fracture. Back pain. Recently hospitalized for the same. Requested by orthopedic surgery. Currently stable. Monitor.  6.  Active smoker. Nicotine patch provided. Patient was counseled for quit smoking.  7.  Hypokalemia. Replaced with IV. Monitor.  8.  Hypoglycemia. Due to poor p.o. intake. Replace with IV dextrose. Monitor.  9.  Sinus tachycardia. Due to sepsis. Monitor.  10.  Hypoalbuminemia. Due to cirrhosis. Also due to poor p.o. intake. Patient received IV albumin due to poor response to IV fluid resuscitation. We will monitor recommendation from critical care.  Diet: Full liquid diet DVT Prophylaxis: subcutaneous Heparin  Advance goals of care discussion: full code  Family Communication: family was present at bedside, at the time of interview. The pt  provided permission to discuss medical plan with the family. Opportunity was given to ask question and all questions were answered satisfactorily.   Disposition:  Discharge to be determined.  Consultants: General surgery PCCM IR Procedures: IR guided drain placement. Ultrasound-guided paracentesis.  Antibiotics: Anti-infectives (From admission, onward)   Start     Dose/Rate Route Frequency Ordered Stop   08/19/17 0945  vancomycin (VANCOCIN) IVPB 750 mg/150 ml premix     750 mg 150 mL/hr over 60 Minutes Intravenous Every 12 hours 08/19/17 0942     08/17/17 2030  meropenem (MERREM) 500 mg in sodium chloride 0.9 % 100 mL IVPB  Status:  Discontinued     500 mg 200 mL/hr over 30 Minutes Intravenous Every 12 hours 08/17/17 1429 08/19/17 0740   08/17/17 0900  vancomycin (VANCOCIN) 500 mg in sodium chloride 0.9 % 100 mL IVPB  Status:  Discontinued     500 mg 100 mL/hr over 60 Minutes Intravenous Every 24 hours 08/16/17 0816 08/19/17 0941   08/16/17 1200  cefTRIAXone (ROCEPHIN) 1 g in sodium chloride 0.9 % 100 mL IVPB  Status:  Discontinued     1 g 200 mL/hr over 30 Minutes Intravenous Every 24 hours 08/15/17 1351 08/16/17 0756   08/16/17 0830  vancomycin (VANCOCIN) IVPB 1000 mg/200 mL premix     1,000 mg 200 mL/hr over 60 Minutes Intravenous  Once 08/16/17 0816 08/16/17 1117   08/16/17 0830  meropenem (MERREM) 1 g in sodium chloride 0.9 % 100 mL IVPB  Status:  Discontinued     1 g 200 mL/hr over 30 Minutes Intravenous Every 12 hours 08/16/17 0816 08/17/17 1429   08/15/17 2200  metroNIDAZOLE (FLAGYL) IVPB 500 mg  Status:  Discontinued     500 mg 100 mL/hr over 60 Minutes Intravenous Every 8 hours 08/15/17 1351 08/16/17 0757   08/15/17 1115  cefTRIAXone (ROCEPHIN) 2 g in sodium chloride 0.9 % 100 mL IVPB     2 g 200 mL/hr over 30 Minutes Intravenous  Once 08/15/17 1113 08/15/17 1221   08/15/17 1115  metroNIDAZOLE (FLAGYL) IVPB 500 mg     500 mg 100 mL/hr over 60 Minutes Intravenous  Once  08/15/17 1113 08/15/17 1317       Objective: Physical Exam: Vitals:   08/19/17 0439 08/19/17 0755 08/19/17 0810 08/19/17 1237  BP: 108/70 108/67    Pulse:  (!) 103    Resp:  19    Temp: 98.7 F (37.1 C)  99 F (37.2 C) 98 F (36.7 C)  TempSrc: Oral  Oral Oral  SpO2:  96%    Weight:      Height:        Intake/Output Summary (Last 24 hours) at 08/19/2017 1559 Last data filed at 08/19/2017 1335 Gross per 24 hour  Intake 1095 ml  Output 1472 ml  Net -377 ml   Filed Weights   08/15/17 1009 08/15/17 1846  Weight: 46.7 kg (103 lb) 46.7 kg (103 lb)   General: Alert, Awake and Oriented to Time, Place and Person. Appear in severe distress, affect flat Eyes: PERRL, Conjunctiva normal ENT: Oral Mucosa clear dry. Neck: difficult to assess JVD, no Abnormal Mass Or lumps Cardiovascular: S1 and S2 Present, no Murmur, Peripheral Pulses Present Respiratory: increased respiratory effort, Bilateral Air entry equal and Decreased, no use of accessory muscle, bilateral  Crackles, no wheezes Abdomen: Bowel Sound present, distended and diffuse mild to moderate tenderness, no hernia Skin: no redness, no Rash, no induration Extremities: no Pedal edema,  no calf tenderness Neurologic: Grossly no focal neuro deficit. Bilaterally Equal motor strength  Data Reviewed: CBC: Recent Labs  Lab 08/15/17 1002 08/15/17 1032 08/15/17 1938 08/16/17 0452 08/17/17 0422 08/18/17 0546  WBC 35.5*  --  38.3* 35.0* 23.3* 19.9*  NEUTROABS 33.7*  --   --   --   --   --   HGB 13.1 13.9 11.2* 11.0* 9.8* 10.2*  HCT 38.3 41.0 34.5* 34.2* 30.4* 31.8*  MCV 83.4  --  83.3 84.0 81.1 82.4  PLT 303  --  287 293 243 229   Basic Metabolic Panel: Recent Labs  Lab 08/15/17 1002 08/15/17 1032 08/15/17 1938 08/16/17 0452 08/16/17 1554 08/17/17 0422 08/18/17 0546  NA 141 139  --  142  --  139 142  K 2.7* 3.0*  --  4.1  --  3.8 3.8  CL 94* 95*  --  100*  --  103 107  CO2 30  --   --  28  --  26 26  GLUCOSE 67 78   --  45*  --  64* 95  BUN 37* 42*  --  46*  --  55* 43*  CREATININE 1.20* 1.20*  --  1.42*  --  1.62* 0.80  CALCIUM 9.2  --   --  9.1  --  9.1 9.4  MG  --   --  1.9 1.9  --  1.9  --   PHOS  --   --   --   --  3.8  --   --     Liver Function Tests: Recent Labs  Lab 08/15/17 1002 08/16/17 0452 08/17/17 0422  AST 31 32 25  ALT 13* 12* 11*  ALKPHOS 194* 150* 120  BILITOT 3.0* 1.8* 1.6*  PROT 7.2 5.9* 5.8*  ALBUMIN 2.3* 1.9* 2.4*   Recent Labs  Lab 08/15/17 1002  LIPASE 18   No results for input(s): AMMONIA in the last 168 hours. Coagulation Profile: Recent Labs  Lab 08/15/17 1938  INR 1.65   Cardiac Enzymes: No results for input(s): CKTOTAL, CKMB, CKMBINDEX, TROPONINI in the last 168 hours. BNP (last 3 results) No results for input(s): PROBNP in the last 8760 hours. CBG: Recent Labs  Lab 08/18/17 2031 08/19/17 0003 08/19/17 0437 08/19/17 0809 08/19/17 1236  GLUCAP 106* 102* 93 86 100*   Studies: No results found.  Scheduled Meds: . benzonatate  100 mg Oral BID  . Chlorhexidine Gluconate Cloth  6 each Topical Q0600  . enoxaparin (LOVENOX) injection  30 mg Subcutaneous Q24H  . gabapentin  300 mg Oral QHS  . guaiFENesin  600 mg Oral BID  . mupirocin ointment  1 application Nasal BID  . nicotine  14 mg Transdermal Daily  . sodium chloride flush  5 mL Intracatheter Q8H   Continuous Infusions: . dextrose 5 % and 0.45 % NaCl with KCl 20 mEq/L 30 mL/hr at 08/19/17 1117  . methocarbamol (ROBAXIN)  IV    . vancomycin Stopped (08/19/17 1134)   PRN Meds: acetaminophen **OR** acetaminophen, guaiFENesin-dextromethorphan, HYDROmorphone (DILAUDID) injection, methocarbamol (ROBAXIN)  IV, methocarbamol, ondansetron **OR** ondansetron (ZOFRAN) IV, oxyCODONE  Time spent:  35 minutes   Author: Lynden Oxford, MD [facility] Pager: [phone] 08/19/2017 3:59 PM  If 7PM-7AM, please contact night-coverage at [email or web address], password [facility]

## 2017-08-19 NOTE — Evaluation (Signed)
Physical Therapy Evaluation Patient Details Name: Rachel Williams MRN: [id number] DOB: Sep 16, 1950 Today's Date: 08/19/2017   History of Present Illness  67 year old female with past medical history of alcohol abuse, prior pneumonia, smoking and admitted for  CHF exacerbation and bacterial peritonitis, hypotension, MRSA.  Had drainage of abscess in abdomen percutaneously on 08/15/17.  PMHx: tricuspid regurg, CHF Pulm HTN, colostomy with reversal  Clinical Impression  Pt was seen for evaluation of mobility with a focus on monitoring her vital signs.  Sitting BP was 117/76, standing BP was 121/74 with O2 sats declining form 95% in bed to 80% after a time standing to ck BP.  Will follow acutely for gentle mobility to allow for progression of endurance and strength, but with limited standing and monitoring of vital signs.  Follow acutely before dc to SNF for these objectives and to shorten the need to stay in SNF.    Follow Up Recommendations SNF    Equipment Recommendations  None recommended by PT    Recommendations for Other Services       Precautions / Restrictions Precautions Precautions: Fall Precaution Comments: Pt demonstrates good tolerance for sitting and had good effort to stand Restrictions Weight Bearing Restrictions: No      Mobility  Bed Mobility Overal bed mobility: Needs Assistance Bed Mobility: Supine to Sit;Sit to Supine     Supine to sit: Mod assist Sit to supine: Mod assist   General bed mobility comments: significant help to support and lift trunk then to increase control of legs with transition due to pain  Transfers Overall transfer level: Needs assistance Equipment used: Rolling walker (2 wheeled);1 person hand held assist Transfers: Sit to/from Stand Sit to Stand: Mod assist;From elevated surface         General transfer comment: dense assistance along with cks of vitals  Ambulation/Gait Ambulation/Gait assistance: Mod assist Ambulation Distance  (Feet): 3 Feet Assistive device: Rolling walker (2 wheeled);1 person hand held assist Gait Pattern/deviations: Shuffle;Step-to pattern;Decreased stride length;Trunk flexed;Wide base of support Gait velocity: reduced Gait velocity interpretation: <1.8 ft/sec, indicate of risk for recurrent falls General Gait Details: minimal wgt shift on legs due to pain and weakness  Stairs            Wheelchair Mobility    Modified Rankin (Stroke Patients Only)       Balance Overall balance assessment: Needs assistance(Had help to sit up side of bed with poor balance to stand an) Sitting-balance support: Feet supported;Bilateral upper extremity supported Sitting balance-Leahy Scale: Fair     Standing balance support: Bilateral upper extremity supported;During functional activity Standing balance-Leahy Scale: Poor                               Pertinent Vitals/Pain Pain Assessment: Faces Faces Pain Scale: Hurts little more Pain Location: abdomen Pain Descriptors / Indicators: Operative site guarding Pain Intervention(s): Ice applied;Limited activity within patient's tolerance;Monitored during session;Premedicated before session;Repositioned    Home Living Family/patient expects to be discharged to:: Unsure Living Arrangements: Alone             Home Equipment: Walker - 4 wheels;Bedside commode;Wheelchair - manual      Prior Function Level of Independence: Independent with assistive device(s)         Comments: uses rollator     Hand Dominance   Dominant Hand: Right    Extremity/Trunk Assessment   Upper Extremity Assessment Upper Extremity Assessment: Generalized weakness  Lower Extremity Assessment Lower Extremity Assessment: Generalized weakness    Cervical / Trunk Assessment Cervical / Trunk Assessment: Kyphotic  Communication   Communication: No difficulties  Cognition Arousal/Alertness: Awake/alert Behavior During Therapy: WFL for tasks  assessed/performed Overall Cognitive Status: Within Functional Limits for tasks assessed                                        General Comments General comments (skin integrity, edema, etc.): pt has clean bandage on her abdomen and a foley cath in place, pulse oximeter and cks of BP were done    Exercises     Assessment/Plan    PT Assessment Patient needs continued PT services  PT Problem List Decreased strength;Decreased range of motion;Decreased activity tolerance;Decreased balance;Decreased mobility;Decreased coordination;Decreased knowledge of use of DME;Decreased safety awareness;Cardiopulmonary status limiting activity;Decreased skin integrity;Pain       PT Treatment Interventions DME instruction;Gait training;Functional mobility training;Therapeutic activities;Balance training;Therapeutic exercise;Neuromuscular re-education;Patient/family education    PT Goals (Current goals can be found in the Care Plan section)  Acute Rehab PT Goals Patient Stated Goal: to get stronger PT Goal Formulation: With patient Time For Goal Achievement: 09/02/17 Potential to Achieve Goals: Good    Frequency Min 2X/week   Barriers to discharge Decreased caregiver support home alone and not able to stand alone    Co-evaluation               AM-PAC PT "6 Clicks" Daily Activity  Outcome Measure Difficulty turning over in bed (including adjusting bedclothes, sheets and blankets)?: Unable Difficulty moving from lying on back to sitting on the side of the bed? : Unable Difficulty sitting down on and standing up from a chair with arms (e.g., wheelchair, bedside commode, etc,.)?: Unable Help needed moving to and from a bed to chair (including a wheelchair)?: A Lot Help needed walking in hospital room?: Total Help needed climbing 3-5 steps with a railing? : Total 6 Click Score: 7    End of Session Equipment Utilized During Treatment: Gait belt;Oxygen Activity Tolerance:  Patient tolerated treatment well;Patient limited by fatigue;Treatment limited secondary to medical complications (Comment)(O2 sats were down to 80% with standing) Patient left: in bed;with call bell/phone within reach;with bed alarm set Nurse Communication: Mobility status PT Visit Diagnosis: Unsteadiness on feet (R26.81);Other abnormalities of gait and mobility (R26.89);Muscle weakness (generalized) (M62.81);Difficulty in walking, not elsewhere classified (R26.2);Adult, failure to thrive (R62.7)    Time: 4098-1191 PT Time Calculation (min) (ACUTE ONLY): 28 min   Charges:   PT Evaluation $PT Eval Moderate Complexity: 1 Mod PT Treatments $Therapeutic Activity: 8-22 mins   PT G Codes:   PT G-Codes **NOT FOR INPATIENT CLASS** Functional Assessment Tool Used: AM-PAC 6 Clicks Basic Mobility    Ivar Drape 08/19/2017, 10:59 PM   Samul Dada, PT MS Acute Rehab Dept. Number: [facility] [id number] and [facility] [phone]

## 2017-08-19 NOTE — Progress Notes (Signed)
Referring Physician(s): Dr Gordy Savers  Supervising Physician: Ruel Favors  Patient Status:  [facility] - In-pt  Chief Complaint:  LLQ abscess  Subjective:  Drain placed 5/10 Much better today Up in bed Less pain  Allergies: Ciprofloxacin; Levofloxacin; Nickel; and Penicillins  Medications: Prior to Admission medications   Medication Sig Start Date End Date Taking? Authorizing Provider  acetaminophen (TYLENOL) 500 MG tablet Take 1,000 mg by mouth as needed for mild pain.   Yes [provider]  folic acid (FOLVITE) 1 MG tablet Take 1 tablet (1 mg total) by mouth daily. 06/26/17  Yes Burnadette Pop, MD  furosemide (LASIX) 40 MG tablet Take 1 tablet (40 mg total) by mouth daily. 06/27/17  Yes Burnadette Pop, MD  naproxen sodium (ALEVE) 220 MG tablet Take 440 mg by mouth daily as needed (pain).   Yes [provider]  Potassium Chloride ER 20 MEQ TBCR Take 20 mEq by mouth daily. 06/26/17  Yes Burnadette Pop, MD  spironolactone (ALDACTONE) 25 MG tablet Take 0.5 tablets (12.5 mg total) by mouth daily. 07/30/17 10/28/17 Yes Bensimhon, Bevelyn Buckles, MD  thiamine 100 MG tablet Take 1 tablet (100 mg total) by mouth daily. Patient not taking: Reported on 07/30/2017 06/26/17   Burnadette Pop, MD     Vital Signs: BP 108/67 (BP Location: Right Arm)   Pulse (!) 103   Temp 98 F (36.7 C) (Oral)   Resp 19   Ht  (1.575 m)   Wt 103 lb (46.7 kg)   SpO2 96%   BMI 18.84 kg/m   Physical Exam  Abdominal: Soft. Bowel sounds are decreased. There is tenderness.  Skin: Skin is warm and dry.  Site clean and dry Less tender overall OP serous 20 cc yesterday Minimal in JP  ABUNDANT METHICILLIN RESISTANT STAPHYLOCOCCUS AUREUS  NO ANAEROBES ISOLATED; CULTURE IN PROGRESS FOR 5 DAYS  Nursing note and vitals reviewed.   Imaging: Dg Chest Port 1 View  Result Date: 08/18/2017 CLINICAL DATA:  Pleural effusion.  Congestive heart failure. EXAM: PORTABLE CHEST 1 VIEW COMPARISON:   One-view chest x-ray 08/16/2017. FINDINGS: The heart is enlarged. Bilateral pleural effusions are present. Bibasilar airspace disease is stable. Perihilar soft tissue prominence is unchanged. The visualized soft tissues and bony thorax are otherwise unremarkable. IMPRESSION: 1. Stable appearance of bilateral pleural effusions and associated airspace disease, likely atelectasis. Electronically Signed   By: Marin Roberts M.D.   On: 08/18/2017 07:32   Dg Chest Port 1 View  Result Date: 08/16/2017 CLINICAL DATA:  Shortness of breath and productive cough EXAM: PORTABLE CHEST 1 VIEW COMPARISON:  08/15/2017 FINDINGS: Cardiac shadow is enlarged accentuated by the portable technique. Aortic calcifications are again seen. Enlarging bilateral pleural effusions are noted right greater than left with associated basilar atelectasis. No pneumothorax is seen. IMPRESSION: Increasing basilar atelectasis and effusions. Electronically Signed   By: Alcide Clever M.D.   On: 08/16/2017 17:20   Ir Image Guided Drainage Percut Cath  Peritoneal Retroperit  Result Date: 08/15/2017 INDICATION: 67 year old female with a history of prior colonic perforation and colon resection currently septic with evidence of a large abscess cavity in the left lower quadrant as well as ascites and evidence of peritoneal enhancement. She presents for percutaneous drain placement and paracentesis. EXAM: IR IMAGE GUIDED DRAINAGE PERCUT CATH PERITONEAL RETROPERIT; IR PARACENTESIS MEDICATIONS: The patient is currently admitted to the hospital and receiving intravenous antibiotics. The antibiotics were administered within an appropriate time frame prior to the initiation of the procedure.  ANESTHESIA/SEDATION: Fentanyl 25 mcg IV; Versed 0.5 mg IV Moderate Sedation Time:  17 minutes The patient was continuously monitored during the procedure by the interventional radiology nurse under my direct supervision. COMPLICATIONS: None immediate. PROCEDURE:  Informed written consent was obtained from the patient after a thorough discussion of the procedural risks, benefits and alternatives. All questions were addressed. Maximal Sterile Barrier Technique was utilized including caps, mask, sterile gowns, sterile gloves, sterile drape, hand hygiene and skin antiseptic. A timeout was performed prior to the initiation of the procedure. Ultrasound was used to interrogate the left upper quadrant. Perihepatic ascites is evident. Local anesthesia was attained by infiltration with 1% lidocaine. Under real-time sonographic guidance, a 17 gauge 7 cm Yueh centesis catheter was carefully advanced into the fluid collection. An image was obtained and stored for the medical record. Paracentesis was then performed yielding approximately 400 mL of cloudy ascitic fluid. A sample was sent for culture. Attention was next turned to the left flank. Ultrasound demonstrates a large complex fluid collection affiliated with the left lower quadrant abdominal wall. Local anesthesia was again attained by infiltration with 1% lidocaine. A small dermatotomy was made. Under real-time sonographic guidance, a 10 French drainage catheter was advanced into the complex fluid collection using trocar technique. The catheter was advanced off the trocar and the locking pigtail loop was formed. Aspiration yields approximately 200 mL thick purulent fluid. The abscess cavity was flushed and the drainage catheter connected to JP bulb suction. The drain was then secured to the skin with 0 Prolene suture. IMPRESSION: 1. Paracentesis yields approximately 400 mL cloudy ascitic fluid highly concerning for bacterial peritonitis. 2. Percutaneous 12 French drain placement yielding approximately 200 mL of thick purulent fluid. Of note, the patient has a very thin body habitus in the drainage catheter is just deep to the skin surface. Given the superficial nature of the drain, inadvertent displacement/removal is a concern.  Recommend extreme care when changing bandages. Signed, Sterling Big, MD [facility] Radiology Electronically Signed   By: Malachy Moan M.D.   On: 08/15/2017 17:23   Ir Paracentesis  Result Date: 08/15/2017 INDICATION: 67 year old female with a history of prior colonic perforation and colon resection currently septic with evidence of a large abscess cavity in the left lower quadrant as well as ascites and evidence of peritoneal enhancement. She presents for percutaneous drain placement and paracentesis. EXAM: IR IMAGE GUIDED DRAINAGE PERCUT CATH PERITONEAL RETROPERIT; IR PARACENTESIS MEDICATIONS: The patient is currently admitted to the hospital and receiving intravenous antibiotics. The antibiotics were administered within an appropriate time frame prior to the initiation of the procedure. ANESTHESIA/SEDATION: Fentanyl 25 mcg IV; Versed 0.5 mg IV Moderate Sedation Time:  17 minutes The patient was continuously monitored during the procedure by the interventional radiology nurse under my direct supervision. COMPLICATIONS: None immediate. PROCEDURE: Informed written consent was obtained from the patient after a thorough discussion of the procedural risks, benefits and alternatives. All questions were addressed. Maximal Sterile Barrier Technique was utilized including caps, mask, sterile gowns, sterile gloves, sterile drape, hand hygiene and skin antiseptic. A timeout was performed prior to the initiation of the procedure. Ultrasound was used to interrogate the left upper quadrant. Perihepatic ascites is evident. Local anesthesia was attained by infiltration with 1% lidocaine. Under real-time sonographic guidance, a 17 gauge 7 cm Yueh centesis catheter was carefully advanced into the fluid collection. An image was obtained and stored for the medical record. Paracentesis was then performed yielding approximately 400  mL of cloudy ascitic fluid. A sample was  sent for culture. Attention was next turned to the left flank. Ultrasound demonstrates a large complex fluid collection affiliated with the left lower quadrant abdominal wall. Local anesthesia was again attained by infiltration with 1% lidocaine. A small dermatotomy was made. Under real-time sonographic guidance, a 10 French drainage catheter was advanced into the complex fluid collection using trocar technique. The catheter was advanced off the trocar and the locking pigtail loop was formed. Aspiration yields approximately 200 mL thick purulent fluid. The abscess cavity was flushed and the drainage catheter connected to JP bulb suction. The drain was then secured to the skin with 0 Prolene suture. IMPRESSION: 1. Paracentesis yields approximately 400 mL cloudy ascitic fluid highly concerning for bacterial peritonitis. 2. Percutaneous 12 French drain placement yielding approximately 200 mL of thick purulent fluid. Of note, the patient has a very thin body habitus in the drainage catheter is just deep to the skin surface. Given the superficial nature of the drain, inadvertent displacement/removal is a concern. Recommend extreme care when changing bandages. Signed, Sterling Big, MD [facility] Radiology Electronically Signed   By: Malachy Moan M.D.   On: 08/15/2017 17:23    Labs:  CBC: Recent Labs    08/15/17 1938 08/16/17 0452 08/17/17 0422 08/18/17 0546  WBC 38.3* 35.0* 23.3* 19.9*  HGB 11.2* 11.0* 9.8* 10.2*  HCT 34.5* 34.2* 30.4* 31.8*  PLT 287 293 243 229    COAGS: Recent Labs    08/15/17 1938  INR 1.65    BMP: Recent Labs    08/15/17 1002 08/15/17 1032 08/16/17 0452 08/17/17 0422 08/18/17 0546  NA 141 139 142 139 142  K 2.7* 3.0* 4.1 3.8 3.8  CL 94* 95* 100* 103 107  CO2 30  --  GLUCOSE 67 78 45* 64* 95  BUN 37* 42* 46* 55* 43*  CALCIUM 9.2  --  9.1 9.1 9.4  CREATININE 1.20* 1.20* 1.42* 1.62* 0.80    GFRNONAA 45*  --  37* 31* >60  GFRAA 52*  --  43* 36* >60    LIVER FUNCTION TESTS: Recent Labs    06/24/17 2049 08/15/17 1002 08/16/17 0452 08/17/17 0422  BILITOT 0.7 3.0* 1.8* 1.6*  AST 43* 31 32 25  ALT 15 13* 12* 11*  ALKPHOS 181* 194* 150* 120  PROT 7.0 7.2 5.9* 5.8*  ALBUMIN 2.9* 2.3* 1.9* 2.4*    Assessment and Plan:  LLQ abscess drain Minimal OP For CT tomorrow per MD   Electronically Signed: Calise Dunckel A, PA-C 08/19/2017, 3:26 PM   I spent a total of 15 Minutes at the the patient's bedside AND on the patient's hospital floor or unit, greater than 50% of which was counseling/coordinating care for LLQ abscess drain

## 2017-08-19 NOTE — Progress Notes (Signed)
Received call from telemetry that patient had a 5 beat run of SVT. MD notified.

## 2017-08-19 NOTE — Progress Notes (Signed)
ANTIBIOTIC CONSULT NOTE - Follow-Up  Pharmacy Consult for Vancomycin  Indication: intra-abdominal infection  Patient Measurements: Height:  (157.5 cm) Weight: 103 lb (46.7 kg) IBW/kg (Calculated) : 50.1  Vital Signs: Temp: 99 F (37.2 C) (05/14 0810) Temp Source: Oral (05/14 0810) BP: 108/70 (05/14 0439) Intake/Output from previous day: 05/13 0701 - 05/14 0700 In: 1285 [P.O.:180; I.V.:800; IV Piggyback:300] Out: 1245 [Urine:1225; Drains:20] Intake/Output from this shift: No intake/output data recorded.  Labs: Recent Labs    08/17/17 0422 08/18/17 0546  WBC 23.3* 19.9*  HGB 9.8* 10.2*  PLT 243 229  CREATININE 1.62* 0.80   Estimated Creatinine Clearance: 48.9 mL/min (by C-G formula based on SCr of 0.8 mg/dL). Recent Labs    08/19/17 0832  VANCOTROUGH 7*     Microbiology: Recent Results (from the past 720 hour(s))  Blood Culture (routine x 2)     Status: None (Preliminary result)   Collection Time: 08/15/17 10:14 AM  Result Value Ref Range Status   Specimen Description BLOOD RIGHT ANTECUBITAL  Final   Special Requests   Final    BOTTLES DRAWN AEROBIC AND ANAEROBIC Blood Culture adequate volume   Culture   Final    NO GROWTH 3 DAYS Performed at [facility] Lab, [address] [place], [place] [address]    Report Status PENDING  Incomplete  Blood Culture (routine x 2)     Status: None (Preliminary result)   Collection Time: 08/15/17 10:51 AM  Result Value Ref Range Status   Specimen Description BLOOD LEFT WRIST  Final   Special Requests   Final    BOTTLES DRAWN AEROBIC AND ANAEROBIC Blood Culture adequate volume   Culture   Final    NO GROWTH 3 DAYS Performed at [facility], [address], [place], [place] [address]    Report Status PENDING  Incomplete  Aerobic/Anaerobic Culture (surgical/deep wound)     Status: None (Preliminary result)   Collection Time: 08/15/17  4:42 PM  Result Value Ref Range Status   Specimen Description ABSCESS   Final   Special Requests PARACENTESIS  Final   Gram Stain   Final    ABUNDANT WBC PRESENT, PREDOMINANTLY PMN ABUNDANT GRAM POSITIVE COCCI Performed at [facility], [address] [place], [place] [address]    Culture   Final    ABUNDANT METHICILLIN RESISTANT STAPHYLOCOCCUS AUREUS NO ANAEROBES ISOLATED; CULTURE IN PROGRESS FOR 5 DAYS    Report Status PENDING  Incomplete   Organism ID, Bacteria METHICILLIN RESISTANT STAPHYLOCOCCUS AUREUS  Final      Susceptibility   Methicillin resistant staphylococcus aureus - MIC*    CIPROFLOXACIN >=8 RESISTANT Resistant     ERYTHROMYCIN >=8 RESISTANT Resistant     GENTAMICIN <=0.5 SENSITIVE Sensitive     OXACILLIN >=4 RESISTANT Resistant     TETRACYCLINE <=1 SENSITIVE Sensitive     VANCOMYCIN 1 SENSITIVE Sensitive     TRIMETH/SULFA <=10 SENSITIVE Sensitive     CLINDAMYCIN RESISTANT Resistant     RIFAMPIN <=0.5 SENSITIVE Sensitive     Inducible Clindamycin POSITIVE Resistant     * ABUNDANT METHICILLIN RESISTANT STAPHYLOCOCCUS AUREUS  MRSA PCR Screening     Status: None   Collection Time: 08/16/17  4:58 AM  Result Value Ref Range Status   MRSA by PCR NEGATIVE NEGATIVE Final    Comment:        The GeneXpert MRSA Assay (FDA approved for NASAL specimens only), is one component of a comprehensive MRSA colonization  surveillance program. It is not intended to diagnose MRSA infection nor to guide or monitor treatment for MRSA infections. Performed at [facility], [address], [place], [place] [address]     Assessment: 67 yo female presented to ED on 4/10 with abdominal pain and hypotension. IR reported high likelihood of infected ascites c/w bacterial peritonitis and LLQ abscess with thick pus. Pharmacy consulted to start vancomycin. Abscess culture growing MRSA. Meropenem d/c'd  Renal function has improved, SCr 0.8<< 1.62 << 1.42, CrCl~50 ml/min. Vancomycin trough subtherapeutic at 7. Will increase vancomycin dose.   Goal  of Therapy:  Vancomycin trough level 15-20 mcg/ml  Plan:  - Increase Vancomycin to 750 mg every 12 hours -D/C Meropenem - Will continue to follow renal function, culture results, LOT, and antibiotic de-escalation plans   Thank you for allowing pharmacy to be a part of this patient's care.  Nikira Kushnir A. Jeanella Craze, PharmD, BCPS Clinical Pharmacist [place] Pager: [phone]  Clinical phone for 08/19/2017 from 7a-3:30p: [phone] If after 3:30p, please call main pharmacy at: [phone] 08/19/2017 10:11 AM

## 2017-08-19 NOTE — Progress Notes (Signed)
[facility]  [address] [place], [place] [address] Phone: [phone]  FAX: Eugenio Saenz [phone] 25-Mar-1951  CARE TEAM:  PCP: Kathyrn Lass, MD  Outpatient Care Team: Patient Care Team: Kathyrn Lass, MD as PCP - General (Family Medicine)  Inpatient Treatment Team: Treatment Team: Attending Provider: Lavina Hamman, MD; Consulting Physician: Nolon Nations, MD; Consulting Physician: Dorthy Cooler Radiology, MD; Rounding Team: Ala Bent, MD; Technician: Money, [place], [place]; Respiratory Therapist: Philomena Doheny, RRT; Registered Nurse: Cyril Loosen, RN; Registered Nurse: Alva Garnet, RN; Registered Nurse: Melonie Florida, RN   Problem List:   Principal Problem:   Intra-abdominal abscess s/p perc drainage 08/15/2017 Active Problems:   Hepatic cirrhosis (HCC)   Hypokalemia   Smoker   Severe sepsis (HCC)   Chronic diastolic CHF (congestive heart failure) (HCC)   Acute respiratory failure (HCC)   Pleural effusion      * No surgery found *     Assessment  Intra-abdominal abscess slowly improving but guarded   Plan:  -adv to full/pureed diet  -CT scan tomorrow for 5d f/u of perc drainage of abscess -IV ABx - Vanco w MRSA.  Meropenem w intra abd abscess & ?PCN allergy -pain control -VTE prophylaxis- SCDs, etc -mobilize as tolerated to help recovery  20 minutes spent in review, evaluation, examination, counseling, and coordination of care.  More than 50% of that time was spent in counseling.  Adin Hector, M.D., F.A.C.S. Gastrointestinal and Minimally Invasive Surgery [facility] [address], [place], [place] [address] [phone] Main / Paging   08/19/2017    Subjective: (Chief complaint)  Sore Wanting cool liquid  Objective:  Vital signs:  Vitals:   08/18/17 1621 08/18/17 2000 08/19/17 0011 08/19/17 0439  BP:  100/65 115/84  108/70  Pulse:      Resp:      Temp: 98.6 F (37 C) 98.6 F (37 C) 98.8 F (37.1 C) 98.7 F (37.1 C)  TempSrc: Oral Axillary Oral Oral  SpO2:      Weight:      Height:        Last BM Date: 08/14/17  Intake/Output   Yesterday:  05/13 0701 - 05/14 0700 In: 1285 [P.O.:180; I.V.:800; IV Piggyback:300] Out: 1505 [Urine:1225; Drains:20] This shift:  No intake/output data recorded.  Bowel function:  Flatus: YES  BM:  No  Drain: Mostly serous   Physical Exam:  General: Pt awake/alert/oriented x4 in mild acute distress Eyes: PERRL, normal EOM.  Sclera clear.  No icterus Neuro: CN II-XII intact w/o focal sensory/motor deficits. Lymph: No head/neck/groin lymphadenopathy Psych:  No delerium/psychosis/paranoia HENT: Normocephalic, Mucus membranes moist.  No thrush Neck: Supple, No tracheal deviation Chest: No chest wall pain w good excursion CV:  Pulses intact.  Regular rhythm MS: Normal AROM mjr joints.  No obvious deformity  Abdomen: Somewhat firm.  Moderately distended.  Tenderness at L flank.  No evidence of peritonitis.  No incarcerated hernias.  Ext:  No deformity.  No mjr edema.  No cyanosis Skin: No petechiae / purpura  Results:   4d ago   Specimen Description ABSCESS   Special Requests PARACENTESIS   Gram Stain ABUNDANT WBC PRESENT, PREDOMINANTLY PMN  ABUNDANT GRAM POSITIVE COCCI  Performed at [facility], [address], [place], [place] [address]     Culture ABUNDANT METHICILLIN RESISTANT STAPHYLOCOCCUS AUREUS  NO ANAEROBES ISOLATED; CULTURE IN PROGRESS  FOR 5 DAYS     Report Status PENDING   Organism ID, Bacteria METHICILLIN RESISTANT STAPHYLOCOCCUS AUREUS   Resulting Agency [facility]  Susceptibility    Methicillin resistant staphylococcus aureus    MIC    CIPROFLOXACIN >=8 RESISTANT  Resistant    CLINDAMYCIN RESISTANT  Resistant    ERYTHROMYCIN >=8 RESISTANT  Resistant    GENTAMICIN <=0.5 SENSI... Sensitive    Inducible Clindamycin  POSITIVE  Resistant    OXACILLIN >=4 RESISTANT  Resistant    RIFAMPIN <=0.5 SENSI... Sensitive    TETRACYCLINE <=1 SENSITIVE  Sensitive    TRIMETH/SULFA <=10 SENSIT... Sensitive    VANCOMYCIN 1 SENSITIVE  Sensitive        Labs: Results for orders placed or performed during the hospital encounter of 08/15/17 (from the past 48 hour(s))  Glucose, capillary     Status: None   Collection Time: 08/17/17  8:13 AM  Result Value Ref Range   Glucose-Capillary 74 65 - 99 mg/dL  Glucose, capillary     Status: None   Collection Time: 08/17/17 12:13 PM  Result Value Ref Range   Glucose-Capillary 87 65 - 99 mg/dL  Glucose, capillary     Status: None   Collection Time: 08/17/17  4:16 PM  Result Value Ref Range   Glucose-Capillary 90 65 - 99 mg/dL  Urinalysis, Routine w reflex microscopic     Status: Abnormal   Collection Time: 08/17/17  5:44 PM  Result Value Ref Range   Color, Urine YELLOW YELLOW   APPearance CLEAR CLEAR   Specific Gravity, Urine 1.019 1.005 - 1.030   pH 5.0 5.0 - 8.0   Glucose, UA NEGATIVE NEGATIVE mg/dL   Hgb urine dipstick MODERATE (A) NEGATIVE   Bilirubin Urine NEGATIVE NEGATIVE   Ketones, ur NEGATIVE NEGATIVE mg/dL   Protein, ur NEGATIVE NEGATIVE mg/dL   Nitrite NEGATIVE NEGATIVE   Leukocytes, UA SMALL (A) NEGATIVE   RBC / HPF 0-5 0 - 5 RBC/hpf   WBC, UA 11-20 0 - 5 WBC/hpf   Bacteria, UA RARE (A) NONE SEEN   Mucus PRESENT     Comment: Performed at [facility], [address], [place], [place] [address]  Glucose, capillary     Status: None   Collection Time: 08/17/17  8:23 PM  Result Value Ref Range   Glucose-Capillary 95 65 - 99 mg/dL  Glucose, capillary     Status: Abnormal   Collection Time: 08/17/17 11:55 PM  Result Value Ref Range   Glucose-Capillary 104 (H) 65 - 99 mg/dL  Glucose, capillary     Status: None   Collection Time: 08/18/17  3:44 AM  Result Value Ref Range   Glucose-Capillary 98 65 - 99 mg/dL  CBC     Status: Abnormal   Collection  Time: 08/18/17  5:46 AM  Result Value Ref Range   WBC 19.9 (H) 4.0 - 10.5 K/uL   RBC 3.86 (L) 3.87 - 5.11 MIL/uL   Hemoglobin 10.2 (L) 12.0 - 15.0 g/dL   HCT 31.8 (L) 36.0 - 46.0 %   MCV 82.4 78.0 - 100.0 fL   MCH 26.4 26.0 - 34.0 pg   MCHC 32.1 30.0 - 36.0 g/dL   RDW 16.1 (H) 11.5 - 15.5 %   Platelets 229 150 - 400 K/uL    Comment: Performed at [facility], [place], [place], [place] [address]  Basic metabolic panel     Status: Abnormal   Collection Time: 08/18/17  5:46 AM  Result Value Ref Range   Sodium 142 135 - 145 mmol/L   Potassium 3.8 3.5 - 5.1 mmol/L   Chloride 107 101 - 111 mmol/L   CO2 26 22 - 32 mmol/L   Glucose, Bld 95 65 - 99 mg/dL   BUN 43 (H) 6 - 20 mg/dL   Creatinine, Ser 0.80 0.44 - 1.00 mg/dL   Calcium 9.4 8.9 - 10.3 mg/dL   GFR calc non Af Amer >60 >60 mL/min   GFR calc Af Amer >60 >60 mL/min    Comment: (NOTE) The eGFR has been calculated using the CKD EPI equation. This calculation has not been validated in all clinical situations. eGFR's persistently <60 mL/min signify possible Chronic Kidney Disease.    Anion gap 9 5 - 15    Comment: Performed at [facility] [address], [place], [place] [address]  Glucose, capillary     Status: None   Collection Time: 08/18/17  7:59 AM  Result Value Ref Range   Glucose-Capillary 92 65 - 99 mg/dL  Glucose, capillary     Status: None   Collection Time: 08/18/17 12:04 PM  Result Value Ref Range   Glucose-Capillary 88 65 - 99 mg/dL  Glucose, capillary     Status: None   Collection Time: 08/18/17  4:19 PM  Result Value Ref Range   Glucose-Capillary 89 65 - 99 mg/dL  Glucose, capillary     Status: Abnormal   Collection Time: 08/18/17  8:31 PM  Result Value Ref Range   Glucose-Capillary 106 (H) 65 - 99 mg/dL  Glucose, capillary     Status: Abnormal   Collection Time: 08/19/17 12:03 AM  Result Value Ref Range   Glucose-Capillary 102 (H) 65 - 99 mg/dL  Glucose, capillary     Status: None    Collection Time: 08/19/17  4:37 AM  Result Value Ref Range   Glucose-Capillary 93 65 - 99 mg/dL    Imaging / Studies: Dg Chest Port 1 View  Result Date: 08/18/2017 CLINICAL DATA:  Pleural effusion.  Congestive heart failure. EXAM: PORTABLE CHEST 1 VIEW COMPARISON:  One-view chest x-ray 08/16/2017. FINDINGS: The heart is enlarged. Bilateral pleural effusions are present. Bibasilar airspace disease is stable. Perihilar soft tissue prominence is unchanged. The visualized soft tissues and bony thorax are otherwise unremarkable. IMPRESSION: 1. Stable appearance of bilateral pleural effusions and associated airspace disease, likely atelectasis. Electronically Signed   By: San Morelle M.D.   On: 08/18/2017 07:32    Medications / Allergies: per chart  Antibiotics: Anti-infectives (From admission, onward)   Start     Dose/Rate Route Frequency Ordered Stop   08/17/17 2030  meropenem (MERREM) 500 mg in sodium chloride 0.9 % 100 mL IVPB     500 mg 200 mL/hr over 30 Minutes Intravenous Every 12 hours 08/17/17 1429     08/17/17 0900  vancomycin (VANCOCIN) 500 mg in sodium chloride 0.9 % 100 mL IVPB     500 mg 100 mL/hr over 60 Minutes Intravenous Every 24 hours 08/16/17 0816     08/16/17 1200  cefTRIAXone (ROCEPHIN) 1 g in sodium chloride 0.9 % 100 mL IVPB  Status:  Discontinued     1 g 200 mL/hr over 30 Minutes Intravenous Every 24 hours 08/15/17 1351 08/16/17 0756   08/16/17 0830  vancomycin (VANCOCIN) IVPB 1000 mg/200 mL premix     1,000 mg 200 mL/hr over 60 Minutes Intravenous  Once 08/16/17 0816 08/16/17 1117   08/16/17 0830  meropenem (MERREM) 1 g in sodium chloride 0.9 % 100 mL IVPB  Status:  Discontinued     1 g 200 mL/hr over 30 Minutes Intravenous Every 12 hours 08/16/17 0816 08/17/17 1429   08/15/17 2200  metroNIDAZOLE (FLAGYL) IVPB 500 mg  Status:  Discontinued     500 mg 100 mL/hr over 60 Minutes Intravenous Every 8 hours 08/15/17 1351 08/16/17 0757   08/15/17 1115   cefTRIAXone (ROCEPHIN) 2 g in sodium chloride 0.9 % 100 mL IVPB     2 g 200 mL/hr over 30 Minutes Intravenous  Once 08/15/17 1113 08/15/17 1221   08/15/17 1115  metroNIDAZOLE (FLAGYL) IVPB 500 mg     500 mg 100 mL/hr over 60 Minutes Intravenous  Once 08/15/17 1113 08/15/17 1317        Note: Portions of this report may have been transcribed using voice recognition software. Every effort was made to ensure accuracy; however, inadvertent computerized transcription errors may be present.   Any transcriptional errors that result from this process are unintentional.     Adin Hector, M.D., F.A.C.S. Gastrointestinal and Minimally Invasive Surgery [facility] [address] [place], [place] [address] [phone] Main / Paging   08/19/2017

## 2017-08-20 ENCOUNTER — Encounter (HOSPITAL_COMMUNITY): Payer: Self-pay | Admitting: Interventional Radiology

## 2017-08-20 ENCOUNTER — Inpatient Hospital Stay (HOSPITAL_COMMUNITY): Payer: Medicare Other

## 2017-08-20 HISTORY — PX: IR PARACENTESIS: IMG2679

## 2017-08-20 LAB — CBC
HEMATOCRIT: 35.5 % — AB (ref 36.0–46.0)
Hemoglobin: 11.2 g/dL — ABNORMAL LOW (ref 12.0–15.0)
MCH: 26.7 pg (ref 26.0–34.0)
MCHC: 31.5 g/dL (ref 30.0–36.0)
MCV: 84.5 fL (ref 78.0–100.0)
Platelets: 192 10*3/uL (ref 150–400)
RBC: 4.2 MIL/uL (ref 3.87–5.11)
RDW: 15.7 % — AB (ref 11.5–15.5)
WBC: 14.9 10*3/uL — AB (ref 4.0–10.5)

## 2017-08-20 LAB — GRAM STAIN

## 2017-08-20 LAB — BODY FLUID CELL COUNT WITH DIFFERENTIAL
Eos, Fluid: 0 %
LYMPHS FL: 0 %
Monocyte-Macrophage-Serous Fluid: 1 % — ABNORMAL LOW (ref 50–90)
NEUTROPHIL FLUID: 99 % — AB (ref 0–25)
Total Nucleated Cell Count, Fluid: 5654 cu mm — ABNORMAL HIGH (ref 0–1000)

## 2017-08-20 LAB — MAGNESIUM: Magnesium: 1 mg/dL — ABNORMAL LOW (ref 1.7–2.4)

## 2017-08-20 LAB — BASIC METABOLIC PANEL
Anion gap: 11 (ref 5–15)
BUN: 12 mg/dL (ref 6–20)
CHLORIDE: 103 mmol/L (ref 101–111)
CO2: 25 mmol/L (ref 22–32)
Calcium: 8.7 mg/dL — ABNORMAL LOW (ref 8.9–10.3)
Creatinine, Ser: 0.39 mg/dL — ABNORMAL LOW (ref 0.44–1.00)
GFR calc Af Amer: >60 mL/min (ref >60)
GFR calc non Af Amer: >60 mL/min (ref >60)
Glucose, Bld: 92 mg/dL (ref 65–99)
POTASSIUM: 3.8 mmol/L (ref 3.5–5.1)
SODIUM: 139 mmol/L (ref 135–145)

## 2017-08-20 LAB — GLUCOSE, CAPILLARY
GLUCOSE-CAPILLARY: 95 mg/dL (ref 65–99)
GLUCOSE-CAPILLARY: 90 mg/dL (ref 65–99)
GLUCOSE-CAPILLARY: 137 mg/dL — AB (ref 65–99)
GLUCOSE-CAPILLARY: 113 mg/dL — AB (ref 65–99)
Glucose-Capillary: 87 mg/dL (ref 65–99)
Glucose-Capillary: 97 mg/dL (ref 65–99)

## 2017-08-20 LAB — CULTURE, BLOOD (ROUTINE X 2)
Culture: NO GROWTH
Culture: NO GROWTH
Special Requests: ADEQUATE
Special Requests: ADEQUATE

## 2017-08-20 LAB — VANCOMYCIN, TROUGH: Vancomycin Tr: 15 ug/mL (ref 15–20)

## 2017-08-20 MED ORDER — LIDOCAINE HCL (PF) 2 % IJ SOLN
INTRAMUSCULAR | Status: DC | PRN
  Administered 2017-08-20: 10 mL

## 2017-08-20 MED ORDER — IOPAMIDOL (ISOVUE-300) INJECTION 61%
INTRAVENOUS | Status: AC
  Administered 2017-08-20: 100 mL via INTRAVENOUS
  Filled 2017-08-20: qty 30

## 2017-08-20 MED ORDER — FUROSEMIDE 10 MG/ML IJ SOLN
40.0000 mg | Freq: Once | INTRAMUSCULAR | Status: AC
  Administered 2017-08-20: 40 mg via INTRAVENOUS
  Filled 2017-08-20: qty 4

## 2017-08-20 MED ORDER — LIDOCAINE HCL (PF) 2 % IJ SOLN
INTRAMUSCULAR | Status: AC
  Filled 2017-08-20: qty 20

## 2017-08-20 NOTE — Progress Notes (Signed)
PROGRESS NOTE    Rachel Williams  WGN:[id number] DOB: [birth year] DOA: 08/15/2017 PCP: Sigmund Hazel, MD   Brief Narrative:  67 year old with a history of chronic diastolic CHF, pulmonary hypertension, active smoker, alcohol abuse, history of bowel perforation status post colectomy, moderate to severe tricuspid regurgitation presented to the hospital with complaints of abdominal pain.  Patient was found to be in septic shock secondary to intra-abdominal abscess versus possible perforated diverticulitis.  CT of the abdomen pelvis showed moderate ascites with 9 cm irregular cystic mass in the left paracolic gutter concerning for abscess.  General surgery was consulted who recommended getting IR guided drain placement which was placed on 5/10.  Due to ascites underwent paracentesis on 5/10 as well.  Cultures ended up growing staph aureus from the drain as well as an narrow therefore patient was started on IV Rocephin and Flagyl and later transitioned to IV meropenem.  Vancomycin was also added.  Case was discussed with Dr. Ninetta Lights from infectious disease who recommended continuing current antibiotics and eventually transitioning to oral Bactrim.  General surgery has been following the patient.   Assessment & Plan:   Principal Problem:   Intra-abdominal abscess s/p perc drainage 08/15/2017 Active Problems:   Hypokalemia   Smoker   Severe sepsis (HCC)   Chronic diastolic CHF (congestive heart failure) (HCC)   Acute respiratory failure (HCC)   Pleural effusion   Hepatic cirrhosis (HCC)   Septic shock secondary to intra-abdominal abscess, improving - At this time we will continue patient's IV antibiotic.  Repeat CT on 5/15 shows significant improvement on left lateral abscess with drain in place.  Does show significant increase in loculated moderate to large volume ascites.  Cirrhosis noted.  Underwent paracentesis 250 cc due to loculated ascites.  Antibiotics have been narrowed to vancomycin only as per  infectious disease there is no indication to cover gram-negative bacteria or anaerobes. -Continue to monitor the drainage.  Low threshold to broaden the antibiotic coverage.  If does not improve slowly then maintained requiring formal infectious disease consult as well.  Acute on chronic diastolic CHF Severe pulmonary hypertension Moderate to severe tricuspid regurgitation and bilateral pleural effusions - Some signs of possible slight volume overload.  Will give 1 dose of IV Lasix.  Repeat chest x-ray tomorrow morning along with other labs. 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  Needs to quit drinking alcohol and follow-up with outpatient gastroenterology.  Provide supportive care  Tobacco use -Nicotine patch as needed  DVT prophylaxis: Subcu heparin Code Status: Full code Family Communication: None at bedside Disposition Plan: Still to be determined  Consultants:   Interventional radiology  Surgery   Subjective: No acute events overnight.  Still has some tenderness under abdomen but nothing new.  Review of Systems Otherwise negative except as per HPI, including: General: Denies fever, chills, night sweats or unintended weight loss. Resp: Denies cough, wheezing Cardiac: Denies chest pain, palpitations, orthopnea, paroxysmal nocturnal dyspnea. GI: Denies  nausea, vomiting, diarrhea or constipation GU: Denies dysuria, frequency, hesitancy or incontinence MS: Denies muscle aches, joint pain or swelling Neuro: Denies headache, neurologic deficits (focal weakness, numbness, tingling), abnormal gait Psych: Denies anxiety, depression, SI/HI/AVH Skin: Denies new rashes or lesions ID: Denies sick contacts, exotic exposures, travel  Objective: Vitals:   08/20/17 0355 08/20/17 0400 08/20/17 1225 08/20/17 1309  BP: 115/67  108/64   Pulse: (!) 101 95  Resp: (!) 21 19    Temp: 98.7 F (37.1 C)   98.2 F (36.8 C)  TempSrc: Oral   Oral  SpO2: 99% 98%    Weight:      Height:        Intake/Output Summary (Last 24 hours) at 08/20/2017 1404 Last data filed at 08/20/2017 1345 Gross per 24 hour  Intake 205 ml  Output 1865 ml  Net -1660 ml   Filed Weights   08/15/17 1009 08/15/17 1846  Weight: 46.7 kg (103 lb) 46.7 kg (103 lb)    Examination:  General exam: Appears calm and comfortable Respiratory system: Mild diffuse coarse breath sounds Cardiovascular system: S1 & S2 heard, RRR. No JVD, murmurs, rubs, gallops or clicks. No pedal edema. Gastrointestinal system: Abdomen is distended with fluid wave shift, soft and nontender. No organomegaly or masses felt. Normal bowel sounds heard.  Abdominal drain noted with serious sanguinous output Central nervous system: Alert and oriented. No focal neurological deficits. Extremities: Symmetric 5 x 5 power. Skin: No rashes, lesions or ulcers Psychiatry: Judgement and insight appear normal. Mood & affect appropriate.     Data Reviewed:   CBC: Recent Labs  Lab 08/15/17 1002  08/15/17 1938 08/16/17 0452 08/17/17 0422 08/18/17 0546 08/20/17 0326  WBC 35.5*  --  38.3* 35.0* 23.3* 19.9* 14.9*  NEUTROABS 33.7*  --   --   --   --   --   --   HGB 13.1   < > 11.2* 11.0* 9.8* 10.2* 11.2*  HCT 38.3   < > 34.5* 34.2* 30.4* 31.8* 35.5*  MCV 83.4  --  83.3 84.0 81.1 82.4 84.5  PLT 303  --  287 293 243 229 192   < > = values in this interval not displayed.   Basic Metabolic Panel: Recent Labs  Lab 08/15/17 1002 08/15/17 1032 08/15/17 1938 08/16/17 0452 08/16/17 1554 08/17/17 0422 08/18/17 0546 08/20/17 0326  NA 141 139  --  142  --  139 142 139  K 2.7* 3.0*  --  4.1  --  3.8 3.8 3.8  CL 94* 95*  --  100*  --  103 107 103  CO2 30  --   --  28  --  GLUCOSE 67 78  --  45*  --  64* 95 92  BUN 37* 42*  --  46*  --  55* 43* 12  CREATININE 1.20* 1.20*  --  1.42*  --  1.62* 0.80  0.39*  CALCIUM 9.2  --   --  9.1  --  9.1 9.4 8.7*  MG  --   --  1.9 1.9  --  1.9  --  1.0*  PHOS  --   --   --   --  3.8  --   --   --    GFR: Estimated Creatinine Clearance: 48.9 mL/min (A) (by C-G formula based on SCr of 0.39 mg/dL (L)). Liver Function Tests: Recent Labs  Lab 08/15/17 1002 08/16/17 0452 08/17/17 0422  AST 31 32 25  ALT 13* 12* 11*  ALKPHOS 194* 150* 120  BILITOT 3.0* 1.8* 1.6*  PROT 7.2 5.9* 5.8*  ALBUMIN 2.3* 1.9* 2.4*   Recent Labs  Lab 08/15/17 1002  LIPASE 18   No results for input(s): AMMONIA in the last 168 hours. Coagulation Profile: Recent Labs  Lab 08/15/17 1938  INR 1.65   Cardiac Enzymes: No results for input(s): CKTOTAL, CKMB, CKMBINDEX, TROPONINI in the  last 168 hours. BNP (last 3 results) No results for input(s): PROBNP in the last 8760 hours. HbA1C: No results for input(s): HGBA1C in the last 72 hours. CBG: Recent Labs  Lab 08/19/17 2017 08/19/17 2356 08/20/17 0409 08/20/17 0745 08/20/17 1139  GLUCAP 83 87 95 90 97   Lipid Profile: No results for input(s): CHOL, HDL, LDLCALC, TRIG, CHOLHDL, LDLDIRECT in the last 72 hours. Thyroid Function Tests: No results for input(s): TSH, T4TOTAL, FREET4, T3FREE, THYROIDAB in the last 72 hours. Anemia Panel: No results for input(s): VITAMINB12, FOLATE, FERRITIN, TIBC, IRON, RETICCTPCT in the last 72 hours. Sepsis Labs: Recent Labs  Lab 08/15/17 1033 08/15/17 1332 08/16/17 1546 08/16/17 1904  LATICACIDVEN 2.33* 1.43 1.3 1.2    Recent Results (from the past 240 hour(s))  Blood Culture (routine x 2)     Status: None   Collection Time: 08/15/17 10:14 AM  Result Value Ref Range Status   Specimen Description BLOOD RIGHT ANTECUBITAL  Final   Special Requests   Final    BOTTLES DRAWN AEROBIC AND ANAEROBIC Blood Culture adequate volume   Culture   Final    NO GROWTH 5 DAYS Performed at [facility], [address], [place], [place] [address]    Report Status 08/20/2017 FINAL   Final  Blood Culture (routine x 2)     Status: None   Collection Time: 08/15/17 10:51 AM  Result Value Ref Range Status   Specimen Description BLOOD LEFT WRIST  Final   Special Requests   Final    BOTTLES DRAWN AEROBIC AND ANAEROBIC Blood Culture adequate volume   Culture   Final    NO GROWTH 5 DAYS Performed at [facility], [address], [place], [place] [address]    Report Status 08/20/2017 FINAL  Final  Aerobic/Anaerobic Culture (surgical/deep wound)     Status: None (Preliminary result)   Collection Time: 08/15/17  4:42 PM  Result Value Ref Range Status   Specimen Description ABSCESS  Final   Special Requests PARACENTESIS  Final   Gram Stain   Final    ABUNDANT WBC PRESENT, PREDOMINANTLY PMN ABUNDANT GRAM POSITIVE COCCI Performed at [facility], [address], [place], [place] [address]    Culture   Final    ABUNDANT METHICILLIN RESISTANT STAPHYLOCOCCUS AUREUS NO ANAEROBES ISOLATED; CULTURE IN PROGRESS FOR 5 DAYS    Report Status PENDING  Incomplete   Organism ID, Bacteria METHICILLIN RESISTANT STAPHYLOCOCCUS AUREUS  Final      Susceptibility   Methicillin resistant staphylococcus aureus - MIC*    CIPROFLOXACIN >=8 RESISTANT Resistant     ERYTHROMYCIN >=8 RESISTANT Resistant     GENTAMICIN <=0.5 SENSITIVE Sensitive     OXACILLIN >=4 RESISTANT Resistant     TETRACYCLINE <=1 SENSITIVE Sensitive     VANCOMYCIN 1 SENSITIVE Sensitive     TRIMETH/SULFA <=10 SENSITIVE Sensitive     CLINDAMYCIN RESISTANT Resistant     RIFAMPIN <=0.5 SENSITIVE Sensitive     Inducible Clindamycin POSITIVE Resistant     * ABUNDANT METHICILLIN RESISTANT STAPHYLOCOCCUS AUREUS  MRSA PCR Screening     Status: None   Collection Time: 08/16/17  4:58 AM  Result Value Ref Range Status   MRSA by PCR NEGATIVE NEGATIVE Final    Comment:        The GeneXpert MRSA Assay (FDA approved for NASAL specimens only), is one component of a comprehensive MRSA colonization surveillance program.  It is not intended to diagnose MRSA infection  nor to guide or monitor treatment for MRSA infections. Performed at [facility], [address] [place], [place] [address]          Radiology Studies: Ct Abdomen Pelvis W Contrast  Result Date: 08/20/2017 CLINICAL DATA:  Abdominal pain, fever.  Follow-up abscess EXAM: CT ABDOMEN AND PELVIS WITH CONTRAST TECHNIQUE: Multidetector CT imaging of the abdomen and pelvis was performed using the standard protocol following bolus administration of intravenous contrast. CONTRAST:  100 mL ISOVUE-300 IOPAMIDOL (ISOVUE-300) INJECTION 61% COMPARISON:  08/15/2017 FINDINGS: Lower chest: New small bilateral pleural effusions with compressive atelectasis in the lower lobes. Heart is mildly enlarged. Hepatobiliary: Subtle nodularity to the liver surface. Enlargement of the caudate lobe. Diffuse hypodensity throughout the liver. Findings likely reflect cirrhosis. No focal hepatic abnormality. Gallbladder unremarkable. There appears to be pneumobilia within the distal common bile duct. Pancreas: No focal abnormality or ductal dilatation. Spleen: No focal abnormality.  Normal size. Adrenals/Urinary Tract: No adrenal abnormality. No focal renal abnormality. No stones or hydronephrosis. Urinary bladder is unremarkable. Stomach/Bowel: No evidence of bowel obstruction. Postoperative changes in the sigmoid colon. Distal gastric wall again appears to be edematous, similar to prior study. Vascular/Lymphatic: Aortic atherosclerosis. No enlarged abdominal or pelvic lymph nodes. Reproductive: IUD noted within the uterus.  No adnexal mass. Other: Moderate to large volume ascites noted throughout the abdomen and pelvis which appears loculated throughout the abdomen and pelvis. This is increased significantly since prior study. Previously seen left abdominal abscess has decreased significantly in size with pigtail drainage catheter in place. The area currently measures 6.1 x 1.7 cm  compared to approximately 9.1 x 4.9 cm previously. Musculoskeletal: No acute bony abnormality. Chronic compression fractures in the lower thoracic and upper lumbar spine. Diffuse degenerative disc and facet disease. IMPRESSION: Significant improvement in the left lateral abdominal abscess with pigtail drainage catheter in place, measurements as above. Only a small amount of drainable fluid remains. Significant increase in loculated moderate to large volume ascites throughout the abdomen and pelvis. Suspect changes of cirrhosis with heterogeneous low-density throughout the liver and mild nodular contours. New small bilateral pleural effusions, compressive atelectasis in the lower lobes. Electronically Signed   By: Charlett Nose M.D.   On: 08/20/2017 10:40   Ir Paracentesis  Result Date: 08/20/2017 INDICATION: Patient with intra-abdominal abscess, cirrhosis, loculated ascites. Request made for diagnostic and therapeutic paracentesis. EXAM: ULTRASOUND GUIDED DIAGNOSTIC AND THERAPEUTIC PARACENTESIS MEDICATIONS: None COMPLICATIONS: None immediate. PROCEDURE: Informed written consent was obtained from the patient after a discussion of the risks, benefits and alternatives to treatment. A timeout was performed prior to the initiation of the procedure. Initial ultrasound scanning demonstrates a moderate amount of loculated ascites within the left lower abdominal quadrant. The left lower abdomen was prepped and draped in the usual sterile fashion. 1% lidocaine was used for local anesthesia. Following this, a 19 gauge, 7-cm, Yueh catheter was introduced. An ultrasound image was saved for documentation purposes. The paracentesis was performed. The catheter was removed and a dressing was applied. The patient tolerated the procedure well without immediate post procedural complication. FINDINGS: A total of approximately 250 cc of turbid, yellow fluid was removed. Samples were sent to the laboratory as requested by the clinical  team. Due to significant loculated nature of ascites only the above amount of fluid could be removed today. IMPRESSION: Successful ultrasound-guided diagnostic/therapeutic paracentesis yielding 250 cc of peritoneal fluid. Read by: Jeananne Rama, PA-C Electronically Signed   By: Simonne Come M.D.   On: 08/20/2017  12:44        Scheduled Meds: . benzonatate  100 mg Oral BID  . Chlorhexidine Gluconate Cloth  6 each Topical Q0600  . enoxaparin (LOVENOX) injection  30 mg Subcutaneous Q24H  . gabapentin  300 mg Oral QHS  . guaiFENesin  600 mg Oral BID  . lidocaine      . mupirocin ointment  1 application Nasal BID  . nicotine  14 mg Transdermal Daily  . sodium chloride flush  5 mL Intracatheter Q8H   Continuous Infusions: . dextrose 5 % and 0.45 % NaCl with KCl 20 mEq/L 30 mL/hr at 08/19/17 2214  . methocarbamol (ROBAXIN)  IV    . vancomycin Stopped (08/20/17 0914)     LOS: 5 days    I have spent 35 minutes face to face with the patient and on the ward discussing the patients care, assessment, plan and disposition with other care givers. >50% of the time was devoted counseling the patient about the risks and benefits of treatment and coordinating care.     Tsuruko Murtha Joline Maxcy, MD [facility] Pager [phone]   If 7PM-7AM, please contact night-coverage [email or web address] Password [facility] 08/20/2017, 2:04 PM

## 2017-08-20 NOTE — Progress Notes (Signed)
ANTIBIOTIC CONSULT NOTE - Follow-Up  Pharmacy Consult for Vancomycin  Indication: intra-abdominal infection  Patient Measurements: Height:  (157.5 cm) Weight: 103 lb (46.7 kg) IBW/kg (Calculated) : 50.1  Vital Signs: Temp: 98.8 F (37.1 C) (05/15 2049) Temp Source: Oral (05/15 2049) BP: 108/64 (05/15 1225) Intake/Output from previous day: 05/14 0701 - 05/15 0700 In: 395 [P.O.:240; I.V.:5; IV Piggyback:150] Out: 1352 [Urine:1325; Drains:27] Intake/Output from this shift: No intake/output data recorded.  Labs: Recent Labs    08/18/17 0546 08/20/17 0326  WBC 19.9* 14.9*  HGB 10.2* 11.2*  PLT 229 192  CREATININE 0.80 0.39*   Estimated Creatinine Clearance: 48.9 mL/min (A) (by C-G formula based on SCr of 0.39 mg/dL (L)). Recent Labs    08/19/17 0832 08/20/17 1942  VANCOTROUGH 7* 15     Microbiology: Recent Results (from the past 720 hour(s))  Blood Culture (routine x 2)     Status: None   Collection Time: 08/15/17 10:14 AM  Result Value Ref Range Status   Specimen Description BLOOD RIGHT ANTECUBITAL  Final   Special Requests   Final    BOTTLES DRAWN AEROBIC AND ANAEROBIC Blood Culture adequate volume   Culture   Final    NO GROWTH 5 DAYS Performed at [facility], [address] [place], [place] [address]    Report Status 08/20/2017 FINAL  Final  Blood Culture (routine x 2)     Status: None   Collection Time: 08/15/17 10:51 AM  Result Value Ref Range Status   Specimen Description BLOOD LEFT WRIST  Final   Special Requests   Final    BOTTLES DRAWN AEROBIC AND ANAEROBIC Blood Culture adequate volume   Culture   Final    NO GROWTH 5 DAYS Performed at [facility], [address], [place], [place] [address]    Report Status 08/20/2017 FINAL  Final  Aerobic/Anaerobic Culture (surgical/deep wound)     Status: None (Preliminary result)   Collection Time: 08/15/17  4:42 PM  Result Value Ref Range Status   Specimen Description ABSCESS  Final   Special Requests PARACENTESIS  Final   Gram Stain   Final    ABUNDANT WBC PRESENT, PREDOMINANTLY PMN ABUNDANT GRAM POSITIVE COCCI Performed at [facility], [address], [place], [place] [address]    Culture   Final    ABUNDANT METHICILLIN RESISTANT STAPHYLOCOCCUS AUREUS NO ANAEROBES ISOLATED; CULTURE IN PROGRESS FOR 5 DAYS    Report Status PENDING  Incomplete   Organism ID, Bacteria METHICILLIN RESISTANT STAPHYLOCOCCUS AUREUS  Final      Susceptibility   Methicillin resistant staphylococcus aureus - MIC*    CIPROFLOXACIN >=8 RESISTANT Resistant     ERYTHROMYCIN >=8 RESISTANT Resistant     GENTAMICIN <=0.5 SENSITIVE Sensitive     OXACILLIN >=4 RESISTANT Resistant     TETRACYCLINE <=1 SENSITIVE Sensitive     VANCOMYCIN 1 SENSITIVE Sensitive     TRIMETH/SULFA <=10 SENSITIVE Sensitive     CLINDAMYCIN RESISTANT Resistant     RIFAMPIN <=0.5 SENSITIVE Sensitive     Inducible Clindamycin POSITIVE Resistant     * ABUNDANT METHICILLIN RESISTANT STAPHYLOCOCCUS AUREUS  MRSA PCR Screening     Status: None   Collection Time: 08/16/17  4:58 AM  Result Value Ref Range Status   MRSA by PCR NEGATIVE NEGATIVE Final    Comment:        The GeneXpert MRSA Assay (FDA approved for NASAL specimens only), is one component of a comprehensive  MRSA colonization surveillance program. It is not intended to diagnose MRSA infection nor to guide or monitor treatment for MRSA infections. Performed at [facility], [address] [place], [place] [address]   Gram stain     Status: None   Collection Time: 08/20/17 12:41 PM  Result Value Ref Range Status   Specimen Description PERITONEAL  Final   Special Requests NONE  Final   Gram Stain   Final    FEW WBC PRESENT, PREDOMINANTLY PMN NO ORGANISMS SEEN Performed at [facility], [address], [place], [place] [address]    Report Status 08/20/2017 FINAL  Final    Assessment: 67 yo female presented to ED on 4/10 with abdominal  pain and hypotension. IR reported high likelihood of infected ascites c/w bacterial peritonitis and LLQ abscess with thick pus. Pharmacy consulted to start vancomycin. Abscess culture growing MRSA. Meropenem d/c'd  Vancomycin trough = 15, low-end therapeutic after 3 doses of 750 mg Q 12. Scr down to 0.39.   Ceftriaxone 5/10 > 5/11 Flagyl 5/10 > 5/11 Vancomycin 5/11 >> Meropenem 5/11 >>5/14  5/10 BCx >> ngtd 5/10 Abscess cx >> abundant MRSA  5/10 Ascites cx >>  Goal of Therapy:  Vancomycin trough level 15-20 mcg/ml  Plan:  -Continue Vancomycin 750 mg every 12 hours  Thank you for allowing pharmacy to be a part of this patient's care.  Bayard Hugger, PharmD, BCPS  Clinical Pharmacist  Pager: [phone]   08/20/2017 9:04 PM

## 2017-08-20 NOTE — Progress Notes (Signed)
Patient ID: Rachel Williams, female   DOB: 05-Apr-1951, 67 y.o.   MRN: [id number]       Subjective: Patient ready for her CT scan.  Drank all of her contrast.  Tolerating her full liquids.    Objective: Vital signs in last 24 hours: Temp:  [98 F (36.7 C)-99.8 F (37.7 C)] 98.7 F (37.1 C) (05/15 0355) Pulse Rate:  [95-106] 95 (05/15 0400) Resp:  [19-21] 19 (05/15 0400) BP: (104-115)/(60-74) 115/67 (05/15 0355) SpO2:  [98 %-99 %] 98 % (05/15 0400) Last BM Date: 08/14/17(per pt,states just now getting a full meal)  Intake/Output from previous day: 05/14 0701 - 05/15 0700 In: 395 [P.O.:240; I.V.:5; IV Piggyback:150] Out: 1352 [Urine:1325; Drains:27] Intake/Output this shift: No intake/output data recorded.  PE: Abd: soft, still tender, but improved from last time I saw her.  Drain with 27cc documented yesterday.  Serous output today.  Some fibrinous exudate in drain.  Lab Results:  Recent Labs    08/18/17 0546 08/20/17 0326  WBC 19.9* 14.9*  HGB 10.2* 11.2*  HCT 31.8* 35.5*  PLT 229 192   BMET Recent Labs    08/18/17 0546 08/20/17 0326  NA 142 139  K 3.8 3.8  CL 107 103  CO2 26 25  GLUCOSE 95 92  BUN 43* 12  CREATININE 0.80 0.39*  CALCIUM 9.4 8.7*   PT/INR No results for input(s): LABPROT, INR in the last 72 hours. CMP     Component Value Date/Time   NA 139 08/20/2017 0326   K 3.8 08/20/2017 0326   CL 103 08/20/2017 0326   CO2 25 08/20/2017 0326   GLUCOSE 92 08/20/2017 0326   BUN 12 08/20/2017 0326   CREATININE 0.39 (L) 08/20/2017 0326   CALCIUM 8.7 (L) 08/20/2017 0326   PROT 5.8 (L) 08/17/2017 0422   ALBUMIN 2.4 (L) 08/17/2017 0422   AST 25 08/17/2017 0422   ALT 11 (L) 08/17/2017 0422   ALKPHOS 120 08/17/2017 0422   BILITOT 1.6 (H) 08/17/2017 0422   GFRNONAA >60 08/20/2017 0326   GFRAA >60 08/20/2017 0326   Lipase     Component Value Date/Time   LIPASE 18 08/15/2017 1002       Studies/Results: No results  found.  Anti-infectives: Anti-infectives (From admission, onward)   Start     Dose/Rate Route Frequency Ordered Stop   08/19/17 0945  vancomycin (VANCOCIN) IVPB 750 mg/150 ml premix     750 mg 150 mL/hr over 60 Minutes Intravenous Every 12 hours 08/19/17 0942     08/17/17 2030  meropenem (MERREM) 500 mg in sodium chloride 0.9 % 100 mL IVPB  Status:  Discontinued     500 mg 200 mL/hr over 30 Minutes Intravenous Every 12 hours 08/17/17 1429 08/19/17 0740   08/17/17 0900  vancomycin (VANCOCIN) 500 mg in sodium chloride 0.9 % 100 mL IVPB  Status:  Discontinued     500 mg 100 mL/hr over 60 Minutes Intravenous Every 24 hours 08/16/17 0816 08/19/17 0941   08/16/17 1200  cefTRIAXone (ROCEPHIN) 1 g in sodium chloride 0.9 % 100 mL IVPB  Status:  Discontinued     1 g 200 mL/hr over 30 Minutes Intravenous Every 24 hours 08/15/17 1351 08/16/17 0756   08/16/17 0830  vancomycin (VANCOCIN) IVPB 1000 mg/200 mL premix     1,000 mg 200 mL/hr over 60 Minutes Intravenous  Once 08/16/17 0816 08/16/17 1117   08/16/17 0830  meropenem (MERREM) 1 g in sodium chloride 0.9 % 100 mL IVPB  Status:  Discontinued     1 g 200 mL/hr over 30 Minutes Intravenous Every 12 hours 08/16/17 0816 08/17/17 1429   08/15/17 2200  metroNIDAZOLE (FLAGYL) IVPB 500 mg  Status:  Discontinued     500 mg 100 mL/hr over 60 Minutes Intravenous Every 8 hours 08/15/17 1351 08/16/17 0757   08/15/17 1115  cefTRIAXone (ROCEPHIN) 2 g in sodium chloride 0.9 % 100 mL IVPB     2 g 200 mL/hr over 30 Minutes Intravenous  Once 08/15/17 1113 08/15/17 1221   08/15/17 1115  metroNIDAZOLE (FLAGYL) IVPB 500 mg     500 mg 100 mL/hr over 60 Minutes Intravenous  Once 08/15/17 1113 08/15/17 1317       Assessment/Plan  Septic shock secondary to Intra-abdominal abscess (resolved sepsis) -on full liquids and doing well.  Will advance to soft diet today. -CT scan completed and not read yet, but abscess looks improved, but she has more ascites than her  prior scan.  Last CX for her ascites revealed MRSA.  Will ask IR to repeat para today. -cont vanc and merrem -drain per IR -WBC down to 14K  Cirrhosis -ascites, will repeat para given infection present in last tap and increasing fluid on new scan.  Diastolic CHF/pulmonary HTN/tricuspid regurg  Hypokalemia - 3.8, resolved COPD, tobacco abuse H/o ETOH abuse, still drinks 2 glasses of wine daily H/o compression fractures Malnutrition  FEN - soft diet VTE - Lovenox /daily ID - merrem/vanc   LOS: 5 days    Letha Cape , [facility] Surgery 08/20/2017, 9:40 AM Pager: [phone]

## 2017-08-20 NOTE — Progress Notes (Signed)
Pt ready to start first bottle of contrast. Will notify day RN to change time for CT pick up

## 2017-08-20 NOTE — Clinical Social Work Note (Signed)
Clinical Social Work Assessment  Patient Details  Name: Rachel Williams MRN: [id number] Date of Birth: 21-Jul-1950  Date of referral:  08/20/17               Reason for consult:  Discharge Planning                Permission sought to share information with:  Oceanographer granted to share information::  Yes, Verbal Permission Granted  Name::        Agency::  [facility]  Relationship::     Contact Information:     Housing/Transportation Living arrangements for the past 2 months:  Assisted Dealer of Information:  Patient, Facility Patient Interpreter Needed:  None Criminal Activity/Legal Involvement Pertinent to Current Situation/Hospitalization:  No - Comment as needed Significant Relationships:  Adult Children Lives with:  Facility Resident Do you feel safe going back to the place where you live?  Yes Need for family participation in patient care:  No (Coment)  Care giving concerns:  CSW received consult for possible SNF placement at time of discharge. CSW spoke with patient regarding PT recommendation of SNF placement at time of discharge. Patient reported that she resides at [facility] and would like to return there at discharge. CSW to continue to follow and assist with discharge planning needs.   Social Worker assessment / plan:  CSW spoke with patient concerning possibility of rehab at [facility] before returning home.  Employment status:  Retired Database administrator PT Recommendations:  Skilled Nursing Facility Information / Referral to community resources:     Patient/Family's Response to care:  Patient states that she wants to return to ALF with home health therapy. Per ALF, if patient requires oxygen at discharge hospital will need to set it up with [facility] before return.   Patient/Family's Understanding of and Emotional Response to Diagnosis, Current Treatment, and Prognosis:   Patient/family is realistic regarding therapy needs and expressed being hopeful for return to ALF placement. Patient expressed understanding of CSW role and discharge process as well as medical condition. No questions/concerns about plan or treatment.    Emotional Assessment Appearance:  Appears stated age Attitude/Demeanor/Rapport:  Self-Confident Affect (typically observed):  Accepting, Appropriate Orientation:  Oriented to Self, Oriented to Place, Oriented to  Time, Oriented to Situation Alcohol / Substance use:  Not Applicable Psych involvement (Current and /or in the community):  No (Comment)  Discharge Needs  Concerns to be addressed:  Care Coordination Readmission within the last 30 days:  No Current discharge risk:  None Barriers to Discharge:  Continued Medical Work up   [employer], LCSWA 08/20/2017, 10:50 AM

## 2017-08-20 NOTE — Procedures (Signed)
Ultrasound-guided diagnostic and therapeutic paracentesis performed yielding 250 cc of turbid, yellow fluid. No immediate complications. Due to significant loculated nature of ascites only the above amount of fluid could be aspirated today despite irrigation. The fluid was sent to the lab for preordered studies.

## 2017-08-20 NOTE — Progress Notes (Signed)
Removed foley per MD order. Purwick in place. Patient has urinated since removal.

## 2017-08-20 NOTE — Progress Notes (Deleted)
CRITICAL VALUE ALERT  Critical Value:  Troponin 0.07  Date & Time Notied:  08/20/17, 03:35am  Provider Notified: NP schorr  Orders Received/Actions taken: awaiting new orders

## 2017-08-20 NOTE — Progress Notes (Signed)
Second bottle of contrast started

## 2017-08-21 ENCOUNTER — Inpatient Hospital Stay (HOSPITAL_COMMUNITY): Payer: Medicare Other

## 2017-08-21 LAB — AEROBIC/ANAEROBIC CULTURE (SURGICAL/DEEP WOUND)

## 2017-08-21 LAB — GLUCOSE, CAPILLARY
GLUCOSE-CAPILLARY: 104 mg/dL — AB (ref 65–99)
GLUCOSE-CAPILLARY: 126 mg/dL — AB (ref 65–99)
Glucose-Capillary: 107 mg/dL — ABNORMAL HIGH (ref 65–99)
Glucose-Capillary: 122 mg/dL — ABNORMAL HIGH (ref 65–99)
Glucose-Capillary: 212 mg/dL — ABNORMAL HIGH (ref 65–99)
Glucose-Capillary: 93 mg/dL (ref 65–99)

## 2017-08-21 LAB — COMPREHENSIVE METABOLIC PANEL
ALBUMIN: 2 g/dL — AB (ref 3.5–5.0)
ALT: 9 U/L — ABNORMAL LOW (ref 14–54)
ANION GAP: 9 (ref 5–15)
AST: 27 U/L (ref 15–41)
Alkaline Phosphatase: 136 U/L — ABNORMAL HIGH (ref 38–126)
BUN: 5 mg/dL — ABNORMAL LOW (ref 6–20)
CHLORIDE: 97 mmol/L — AB (ref 101–111)
CO2: 29 mmol/L (ref 22–32)
Calcium: 8.1 mg/dL — ABNORMAL LOW (ref 8.9–10.3)
Creatinine, Ser: 0.39 mg/dL — ABNORMAL LOW (ref 0.44–1.00)
GFR calc Af Amer: >60 mL/min (ref >60)
GFR calc non Af Amer: >60 mL/min (ref >60)
GLUCOSE: 100 mg/dL — AB (ref 65–99)
POTASSIUM: 3.4 mmol/L — AB (ref 3.5–5.1)
SODIUM: 135 mmol/L (ref 135–145)
Total Bilirubin: 1.4 mg/dL — ABNORMAL HIGH (ref 0.3–1.2)
Total Protein: 5 g/dL — ABNORMAL LOW (ref 6.5–8.1)

## 2017-08-21 LAB — MAGNESIUM: MAGNESIUM: 0.9 mg/dL — AB (ref 1.7–2.4)

## 2017-08-21 LAB — CBC
HCT: 35.2 % — ABNORMAL LOW (ref 36.0–46.0)
Hemoglobin: 11.5 g/dL — ABNORMAL LOW (ref 12.0–15.0)
MCH: 26.9 pg (ref 26.0–34.0)
MCHC: 32.7 g/dL (ref 30.0–36.0)
MCV: 82.4 fL (ref 78.0–100.0)
PLATELETS: 199 10*3/uL (ref 150–400)
RBC: 4.27 MIL/uL (ref 3.87–5.11)
RDW: 15.1 % (ref 11.5–15.5)
WBC: 14.8 10*3/uL — AB (ref 4.0–10.5)

## 2017-08-21 LAB — AEROBIC/ANAEROBIC CULTURE W GRAM STAIN (SURGICAL/DEEP WOUND)

## 2017-08-21 LAB — PATHOLOGIST SMEAR REVIEW

## 2017-08-21 LAB — BRAIN NATRIURETIC PEPTIDE: B NATRIURETIC PEPTIDE 5: 3278.5 pg/mL — AB (ref 0.0–100.0)

## 2017-08-21 MED ORDER — MAGNESIUM SULFATE 4 GM/100ML IV SOLN
4.0000 g | Freq: Once | INTRAVENOUS | Status: AC
  Administered 2017-08-21: 4 g via INTRAVENOUS
  Filled 2017-08-21: qty 100

## 2017-08-21 MED ORDER — DEXTROSE 5 % IV SOLN
3.0000 g | Freq: Once | INTRAVENOUS | Status: AC
  Administered 2017-08-21: 3 g via INTRAVENOUS
  Filled 2017-08-21: qty 6

## 2017-08-21 MED ORDER — FUROSEMIDE 10 MG/ML IJ SOLN
80.0000 mg | Freq: Three times a day (TID) | INTRAMUSCULAR | Status: AC
  Administered 2017-08-21 (×2): 80 mg via INTRAVENOUS
  Filled 2017-08-21 (×2): qty 8

## 2017-08-21 MED ORDER — POTASSIUM CHLORIDE 10 MEQ/100ML IV SOLN
10.0000 meq | INTRAVENOUS | Status: AC
  Administered 2017-08-21 (×4): 10 meq via INTRAVENOUS
  Filled 2017-08-21 (×4): qty 100

## 2017-08-21 MED ORDER — MAGNESIUM OXIDE 400 (241.3 MG) MG PO TABS
800.0000 mg | ORAL_TABLET | Freq: Once | ORAL | Status: AC
  Administered 2017-08-21: 800 mg via ORAL
  Filled 2017-08-21: qty 2

## 2017-08-21 MED ORDER — POTASSIUM CHLORIDE CRYS ER 20 MEQ PO TBCR
20.0000 meq | EXTENDED_RELEASE_TABLET | Freq: Once | ORAL | Status: AC
  Administered 2017-08-21: 20 meq via ORAL
  Filled 2017-08-21: qty 1

## 2017-08-21 MED ORDER — ENSURE ENLIVE PO LIQD
237.0000 mL | Freq: Two times a day (BID) | ORAL | Status: DC
  Administered 2017-08-21: 237 mL via ORAL

## 2017-08-21 NOTE — Progress Notes (Signed)
Paged provider K. Schorr x2 regarding pt magnesium of 0.9. Awaiting further orders. Will continue to monitor pt

## 2017-08-21 NOTE — Progress Notes (Signed)
Pt with a critical mag of 0.9. Paged K.Schorr. Awaiting further ordersCRITICAL VALUE ALERT  Critical Value: magnesium of 0.9  Date & Time Notied: 08/21/17 0545  Provider Notified: Merdis Delay  Orders Received/Actions taken: Awaiting further orders.

## 2017-08-21 NOTE — Progress Notes (Signed)
Patient ID: Rachel Williams, female   DOB: March 17, 1951, 67 y.o.   MRN: [id number]       Subjective: Patient feels ok today, but isn't moving around much.  eating ome of her soft diet, but very little.  Passed flatus and had a BM yesterday.  Ascites loculated and only 250cc was able to be removed.    Objective: Vital signs in last 24 hours: Temp:  [98.2 F (36.8 C)-99.3 F (37.4 C)] 99 F (37.2 C) (05/16 0758) Pulse Rate:  [93-100] 93 (05/16 0757) Resp:  [19-22] 20 (05/16 0757) BP: (108-130)/(64-76) 108/66 (05/16 0757) SpO2:  [88 %-100 %] 88 % (05/16 0757) Last BM Date: 08/20/17  Intake/Output from previous day: 05/15 0701 - 05/16 0700 In: 415 [P.O.:400; I.V.:5] Out: 3272 [Urine:3250; Drains:22] Intake/Output this shift: No intake/output data recorded.  PE: Heart: regular Lungs: CTAB Abd: soft, less tender, +BS, drain with minimal serous output.  Lab Results:  Recent Labs    08/20/17 0326 08/21/17 0415  WBC 14.9* 14.8*  HGB 11.2* 11.5*  HCT 35.5* 35.2*  PLT 192 199   BMET Recent Labs    08/20/17 0326 08/21/17 0415  NA 139 135  K 3.8 3.4*  CL 103 97*  CO2 25 29  GLUCOSE 92 100*  BUN 12 5*  CREATININE 0.39* 0.39*  CALCIUM 8.7* 8.1*   PT/INR No results for input(s): LABPROT, INR in the last 72 hours. CMP     Component Value Date/Time   NA 135 08/21/2017 0415   K 3.4 (L) 08/21/2017 0415   CL 97 (L) 08/21/2017 0415   CO2 29 08/21/2017 0415   GLUCOSE 100 (H) 08/21/2017 0415   BUN 5 (L) 08/21/2017 0415   CREATININE 0.39 (L) 08/21/2017 0415   CALCIUM 8.1 (L) 08/21/2017 0415   PROT 5.0 (L) 08/21/2017 0415   ALBUMIN 2.0 (L) 08/21/2017 0415   AST 27 08/21/2017 0415   ALT 9 (L) 08/21/2017 0415   ALKPHOS 136 (H) 08/21/2017 0415   BILITOT 1.4 (H) 08/21/2017 0415   GFRNONAA >60 08/21/2017 0415   GFRAA >60 08/21/2017 0415   Lipase     Component Value Date/Time   LIPASE 18 08/15/2017 1002       Studies/Results: Ct Abdomen Pelvis W Contrast  Result  Date: 08/20/2017 CLINICAL DATA:  Abdominal pain, fever.  Follow-up abscess EXAM: CT ABDOMEN AND PELVIS WITH CONTRAST TECHNIQUE: Multidetector CT imaging of the abdomen and pelvis was performed using the standard protocol following bolus administration of intravenous contrast. CONTRAST:  100 mL ISOVUE-300 IOPAMIDOL (ISOVUE-300) INJECTION 61% COMPARISON:  08/15/2017 FINDINGS: Lower chest: New small bilateral pleural effusions with compressive atelectasis in the lower lobes. Heart is mildly enlarged. Hepatobiliary: Subtle nodularity to the liver surface. Enlargement of the caudate lobe. Diffuse hypodensity throughout the liver. Findings likely reflect cirrhosis. No focal hepatic abnormality. Gallbladder unremarkable. There appears to be pneumobilia within the distal common bile duct. Pancreas: No focal abnormality or ductal dilatation. Spleen: No focal abnormality.  Normal size. Adrenals/Urinary Tract: No adrenal abnormality. No focal renal abnormality. No stones or hydronephrosis. Urinary bladder is unremarkable. Stomach/Bowel: No evidence of bowel obstruction. Postoperative changes in the sigmoid colon. Distal gastric wall again appears to be edematous, similar to prior study. Vascular/Lymphatic: Aortic atherosclerosis. No enlarged abdominal or pelvic lymph nodes. Reproductive: IUD noted within the uterus.  No adnexal mass. Other: Moderate to large volume ascites noted throughout the abdomen and pelvis which appears loculated throughout the abdomen and pelvis. This is increased significantly since prior  study. Previously seen left abdominal abscess has decreased significantly in size with pigtail drainage catheter in place. The area currently measures 6.1 x 1.7 cm compared to approximately 9.1 x 4.9 cm previously. Musculoskeletal: No acute bony abnormality. Chronic compression fractures in the lower thoracic and upper lumbar spine. Diffuse degenerative disc and facet disease. IMPRESSION: Significant improvement in  the left lateral abdominal abscess with pigtail drainage catheter in place, measurements as above. Only a small amount of drainable fluid remains. Significant increase in loculated moderate to large volume ascites throughout the abdomen and pelvis. Suspect changes of cirrhosis with heterogeneous low-density throughout the liver and mild nodular contours. New small bilateral pleural effusions, compressive atelectasis in the lower lobes. Electronically Signed   By: Charlett Nose M.D.   On: 08/20/2017 10:40   Ir Paracentesis  Result Date: 08/20/2017 INDICATION: Patient with intra-abdominal abscess, cirrhosis, loculated ascites. Request made for diagnostic and therapeutic paracentesis. EXAM: ULTRASOUND GUIDED DIAGNOSTIC AND THERAPEUTIC PARACENTESIS MEDICATIONS: None COMPLICATIONS: None immediate. PROCEDURE: Informed written consent was obtained from the patient after a discussion of the risks, benefits and alternatives to treatment. A timeout was performed prior to the initiation of the procedure. Initial ultrasound scanning demonstrates a moderate amount of loculated ascites within the left lower abdominal quadrant. The left lower abdomen was prepped and draped in the usual sterile fashion. 1% lidocaine was used for local anesthesia. Following this, a 19 gauge, 7-cm, Yueh catheter was introduced. An ultrasound image was saved for documentation purposes. The paracentesis was performed. The catheter was removed and a dressing was applied. The patient tolerated the procedure well without immediate post procedural complication. FINDINGS: A total of approximately 250 cc of turbid, yellow fluid was removed. Samples were sent to the laboratory as requested by the clinical team. Due to significant loculated nature of ascites only the above amount of fluid could be removed today. IMPRESSION: Successful ultrasound-guided diagnostic/therapeutic paracentesis yielding 250 cc of peritoneal fluid. Read by: Jeananne Rama, PA-C  Electronically Signed   By: Simonne Come M.D.   On: 08/20/2017 12:44    Anti-infectives: Anti-infectives (From admission, onward)   Start     Dose/Rate Route Frequency Ordered Stop   08/19/17 0945  vancomycin (VANCOCIN) IVPB 750 mg/150 ml premix     750 mg 150 mL/hr over 60 Minutes Intravenous Every 12 hours 08/19/17 0942     08/17/17 2030  meropenem (MERREM) 500 mg in sodium chloride 0.9 % 100 mL IVPB  Status:  Discontinued     500 mg 200 mL/hr over 30 Minutes Intravenous Every 12 hours 08/17/17 1429 08/19/17 0740   08/17/17 0900  vancomycin (VANCOCIN) 500 mg in sodium chloride 0.9 % 100 mL IVPB  Status:  Discontinued     500 mg 100 mL/hr over 60 Minutes Intravenous Every 24 hours 08/16/17 0816 08/19/17 0941   08/16/17 1200  cefTRIAXone (ROCEPHIN) 1 g in sodium chloride 0.9 % 100 mL IVPB  Status:  Discontinued     1 g 200 mL/hr over 30 Minutes Intravenous Every 24 hours 08/15/17 1351 08/16/17 0756   08/16/17 0830  vancomycin (VANCOCIN) IVPB 1000 mg/200 mL premix     1,000 mg 200 mL/hr over 60 Minutes Intravenous  Once 08/16/17 0816 08/16/17 1117   08/16/17 0830  meropenem (MERREM) 1 g in sodium chloride 0.9 % 100 mL IVPB  Status:  Discontinued     1 g 200 mL/hr over 30 Minutes Intravenous Every 12 hours 08/16/17 0816 08/17/17 1429   08/15/17 2200  metroNIDAZOLE (FLAGYL)  IVPB 500 mg  Status:  Discontinued     500 mg 100 mL/hr over 60 Minutes Intravenous Every 8 hours 08/15/17 1351 08/16/17 0757   08/15/17 1115  cefTRIAXone (ROCEPHIN) 2 g in sodium chloride 0.9 % 100 mL IVPB     2 g 200 mL/hr over 30 Minutes Intravenous  Once 08/15/17 1113 08/15/17 1221   08/15/17 1115  metroNIDAZOLE (FLAGYL) IVPB 500 mg     500 mg 100 mL/hr over 60 Minutes Intravenous  Once 08/15/17 1113 08/15/17 1317       Assessment/Plan Septic shock secondary to Intra-abdominal abscess (resolved sepsis) -tolerating soft diet, but not eating much.  Will add ensure -para only with 250cc removed secondary to  loculations.  Cell count still shows elevated WBC.  Await cx to see if MRSA has cleared. -cont vanc and merrem -drain per IR -WBC stable at 14K  Cirrhosis -ascites, loculated Deconditioning  -cont PT.  Patient needs to mobilize more.  She has only been OOB once since being here.    Diastolic CHF/pulmonary HTN/tricuspid regurg  Hypokalemia - 3.4, per medicine COPD, tobacco abuse H/o ETOH abuse, still drinks 2 glasses of wine daily H/o compression fractures Malnutrition  FEN - soft diet VTE - Lovenox /daily ID - merrem/vanc    LOS: 6 days    Letha Cape , [facility] 08/21/2017, 8:25 AM Pager: [phone]

## 2017-08-21 NOTE — Progress Notes (Signed)
Patient with a critical magnesium level of 0.9. Provier K. Schorr ordered for pt to get 4g of magnesium iv. Iv magnesium was hung. Will continue to monitor pt

## 2017-08-21 NOTE — Progress Notes (Signed)
Referring Physician(s): Dr Gordy Savers  Supervising Physician: Dr. Grace Isaac  Patient Status:  [facility] - In-pt  Chief Complaint:  LLQ abscess  Subjective:  Drain placed 5/10 S/p limited para yesterday. Feeling ok, not much appetite for solids  Allergies: Ciprofloxacin; Levofloxacin; Nickel; and Penicillins  Medications:  Current Facility-Administered Medications:  .  acetaminophen (TYLENOL) tablet 650 mg, 650 mg, Oral, Q6H PRN, 650 mg at 08/16/17 0446 **OR** acetaminophen (TYLENOL) suppository 650 mg, 650 mg, Rectal, Q6H PRN, Rachel Williams, [name], MD .  benzonatate (TESSALON) capsule 100 mg, 100 mg, Oral, BID, Rolly Salter, MD, 100 mg at 08/21/17 0843 .  Chlorhexidine Gluconate Cloth 2 % PADS 6 each, 6 each, Topical, Q0600, Karie Soda, MD, 6 each at 08/21/17 [phone] .  dextrose 5 % and 0.45 % NaCl with KCl 20 mEq/L infusion, , Intravenous, Continuous, Rolly Salter, MD, Last Rate: 30 mL/hr at 08/19/17 2214 .  enoxaparin (LOVENOX) injection 30 mg, 30 mg, Subcutaneous, Q24H, Rolly Salter, MD, 30 mg at 08/20/17 2149 .  feeding supplement (ENSURE ENLIVE) (ENSURE ENLIVE) liquid 237 mL, 237 mL, Oral, BID BM, Barnetta Chapel, PA-C, 237 mL at 08/21/17 1047 .  furosemide (LASIX) injection 80 mg, 80 mg, Intravenous, Q8H, Amin, Ankit Chirag, MD, 80 mg at 08/21/17 0854 .  gabapentin (NEURONTIN) capsule 300 mg, 300 mg, Oral, QHS, Karie Soda, MD, 300 mg at 08/20/17 2149 .  guaiFENesin (MUCINEX) 12 hr tablet 600 mg, 600 mg, Oral, BID, Rolly Salter, MD, 600 mg at 08/21/17 0846 .  guaiFENesin-dextromethorphan (ROBITUSSIN DM) 100-10 MG/5ML syrup 5 mL, 5 mL, Oral, Q4H PRN, Bodenheimer, Charles A, NP, 5 mL at 08/18/17 2115 .  HYDROmorphone (DILAUDID) injection 0.5-1 mg, 0.5-1 mg, Intravenous, Q2H PRN, Karie Soda, MD .  lidocaine (XYLOCAINE) 2 % injection, , Infiltration, PRN, Allred, Darrell K, PA-C, 10 mL at 08/20/17 1226 .  methocarbamol (ROBAXIN) 1,000 mg in dextrose 5 % 50 mL IVPB, 1,000 mg,  Intravenous, Q6H PRN, Karie Soda, MD .  methocarbamol (ROBAXIN) tablet 1,000 mg, 1,000 mg, Oral, Q6H PRN, Karie Soda, MD .  mupirocin ointment (BACTROBAN) 2 % 1 application, 1 application, Nasal, BID, Gross, Viviann Spare, MD, 1 application at 08/21/17 0847 .  nicotine (NICODERM CQ - dosed in mg/24 hours) patch 14 mg, 14 mg, Transdermal, Daily, Rachel Williams, [name], MD, 14 mg at 08/21/17 0844 .  ondansetron (ZOFRAN) tablet 4 mg, 4 mg, Oral, Q6H PRN **OR** ondansetron (ZOFRAN) injection 4 mg, 4 mg, Intravenous, Q6H PRN, Calvert Cantor, MD, 4 mg at 08/16/17 2031 .  oxyCODONE (Oxy IR/ROXICODONE) immediate release tablet 5-10 mg, 5-10 mg, Oral, Q4H PRN, Karie Soda, MD, 10 mg at 08/19/17 0825 .  potassium chloride 10 mEq in 100 mL IVPB, 10 mEq, Intravenous, Q1 Hr x 4, Amin, Ankit Chirag, MD, Last Rate: 100 mL/hr at 08/21/17 1046, 10 mEq at 08/21/17 1046 .  potassium chloride SA (K-DUR,KLOR-CON) CR tablet 20 mEq, 20 mEq, Oral, Once, Amin, Ankit Chirag, MD .  sodium chloride flush (NS) 0.9 % injection 5 mL, 5 mL, Intracatheter, Q8H, Malachy Moan, MD, 5 mL at 08/21/17 4010 .  vancomycin (VANCOCIN) IVPB 750 mg/150 ml premix, 750 mg, Intravenous, Q12H, Lodema Hong A, RPH, Last Rate: 150 mL/hr at 08/21/17 1043, 750 mg at 08/21/17 1043    Vital Signs: BP 108/66   Pulse 99   Temp 99 F (37.2 C) (Oral)   Resp (!) 21   Ht  (1.575 m)   Wt 103 lb (46.7 kg)  SpO2 91%   BMI 18.84 kg/m   Physical Exam  Abdominal: Soft. Bowel sounds are decreased. There is tenderness.  Skin: Skin is warm and dry.  Site clean and dry Less tender overall OP serous Minimal in JP    Nursing note and vitals reviewed.   Imaging: Ct Abdomen Pelvis W Contrast  Result Date: 08/20/2017 CLINICAL DATA:  Abdominal pain, fever.  Follow-up abscess EXAM: CT ABDOMEN AND PELVIS WITH CONTRAST TECHNIQUE: Multidetector CT imaging of the abdomen and pelvis was performed using the standard protocol following bolus administration  of intravenous contrast. CONTRAST:  100 mL ISOVUE-300 IOPAMIDOL (ISOVUE-300) INJECTION 61% COMPARISON:  08/15/2017 FINDINGS: Lower chest: New small bilateral pleural effusions with compressive atelectasis in the lower lobes. Heart is mildly enlarged. Hepatobiliary: Subtle nodularity to the liver surface. Enlargement of the caudate lobe. Diffuse hypodensity throughout the liver. Findings likely reflect cirrhosis. No focal hepatic abnormality. Gallbladder unremarkable. There appears to be pneumobilia within the distal common bile duct. Pancreas: No focal abnormality or ductal dilatation. Spleen: No focal abnormality.  Normal size. Adrenals/Urinary Tract: No adrenal abnormality. No focal renal abnormality. No stones or hydronephrosis. Urinary bladder is unremarkable. Stomach/Bowel: No evidence of bowel obstruction. Postoperative changes in the sigmoid colon. Distal gastric wall again appears to be edematous, similar to prior study. Vascular/Lymphatic: Aortic atherosclerosis. No enlarged abdominal or pelvic lymph nodes. Reproductive: IUD noted within the uterus.  No adnexal mass. Other: Moderate to large volume ascites noted throughout the abdomen and pelvis which appears loculated throughout the abdomen and pelvis. This is increased significantly since prior study. Previously seen left abdominal abscess has decreased significantly in size with pigtail drainage catheter in place. The area currently measures 6.1 x 1.7 cm compared to approximately 9.1 x 4.9 cm previously. Musculoskeletal: No acute bony abnormality. Chronic compression fractures in the lower thoracic and upper lumbar spine. Diffuse degenerative disc and facet disease. IMPRESSION: Significant improvement in the left lateral abdominal abscess with pigtail drainage catheter in place, measurements as above. Only a small amount of drainable fluid remains. Significant increase in loculated moderate to large volume ascites throughout the abdomen and pelvis.  Suspect changes of cirrhosis with heterogeneous low-density throughout the liver and mild nodular contours. New small bilateral pleural effusions, compressive atelectasis in the lower lobes. Electronically Signed   By: Charlett Nose M.D.   On: 08/20/2017 10:40   Dg Chest Port 1 View  Result Date: 08/21/2017 CLINICAL DATA:  Shortness of breath cough.  Congestion. EXAM: PORTABLE CHEST 1 VIEW COMPARISON:  08/18/2017. FINDINGS: Cardiomegaly with bilateral pulmonary infiltrates/edema and bilateral pleural effusions. Similar findings noted on prior exams. Findings consistent with congestive heart failure with pulmonary edema. Bilateral pneumonia could also present this fashion. No significant interim change from prior exam. IMPRESSION: Findings consistent with congestive heart failure with bilateral pulmonary edema bilateral pleural effusions. No significant interim change from prior exam. Electronically Signed   By: Maisie Fus  Register   On: 08/21/2017 10:06   Dg Chest Port 1 View  Result Date: 08/18/2017 CLINICAL DATA:  Pleural effusion.  Congestive heart failure. EXAM: PORTABLE CHEST 1 VIEW COMPARISON:  One-view chest x-ray 08/16/2017. FINDINGS: The heart is enlarged. Bilateral pleural effusions are present. Bibasilar airspace disease is stable. Perihilar soft tissue prominence is unchanged. The visualized soft tissues and bony thorax are otherwise unremarkable. IMPRESSION: 1. Stable appearance of bilateral pleural effusions and associated airspace disease, likely atelectasis. Electronically Signed   By: Marin Roberts M.D.   On: 08/18/2017 07:32   Ir Paracentesis  Result Date: 08/20/2017 INDICATION: Patient with intra-abdominal abscess, cirrhosis, loculated ascites. Request made for diagnostic and therapeutic paracentesis. EXAM: ULTRASOUND GUIDED DIAGNOSTIC AND THERAPEUTIC PARACENTESIS MEDICATIONS: None COMPLICATIONS: None immediate. PROCEDURE: Informed written consent was obtained from the patient after  a discussion of the risks, benefits and alternatives to treatment. A timeout was performed prior to the initiation of the procedure. Initial ultrasound scanning demonstrates a moderate amount of loculated ascites within the left lower abdominal quadrant. The left lower abdomen was prepped and draped in the usual sterile fashion. 1% lidocaine was used for local anesthesia. Following this, a 19 gauge, 7-cm, Yueh catheter was introduced. An ultrasound image was saved for documentation purposes. The paracentesis was performed. The catheter was removed and a dressing was applied. The patient tolerated the procedure well without immediate post procedural complication. FINDINGS: A total of approximately 250 cc of turbid, yellow fluid was removed. Samples were sent to the laboratory as requested by the clinical team. Due to significant loculated nature of ascites only the above amount of fluid could be removed today. IMPRESSION: Successful ultrasound-guided diagnostic/therapeutic paracentesis yielding 250 cc of peritoneal fluid. Read by: Jeananne Rama, PA-C Electronically Signed   By: Simonne Come M.D.   On: 08/20/2017 12:44    Labs:  CBC: Recent Labs    08/17/17 0422 08/18/17 0546 08/20/17 0326 08/21/17 0415  WBC 23.3* 19.9* 14.9* 14.8*  HGB 9.8* 10.2* 11.2* 11.5*  HCT 30.4* 31.8* 35.5* 35.2*  PLT 243 229 192 199    COAGS: Recent Labs    08/15/17 1938  INR 1.65    BMP: Recent Labs    08/17/17 0422 08/18/17 0546 08/20/17 0326 08/21/17 0415  NA 139 142 139 135  K 3.8 3.8 3.8 3.4*  CL 103 107 103 97*  CO2 GLUCOSE 64* 95 92 100*  BUN 55* 43* 12 5*  CALCIUM 9.1 9.4 8.7* 8.1*  CREATININE 1.62* 0.80 0.39* 0.39*  GFRNONAA 31* >60 >60 >60  GFRAA 36* >60 >60 >60    LIVER FUNCTION TESTS: Recent Labs    08/15/17 1002 08/16/17 0452 08/17/17 0422 08/21/17 0415  BILITOT 3.0* 1.8* 1.6* 1.4*  AST 31 32 25 27  ALT 13* 12* 11* 9*  ALKPHOS 194* 150* 120 136*  PROT 7.2 5.9* 5.8*  5.0*  ALBUMIN 2.3* 1.9* 2.4* 2.0*    Assessment and Plan:  LLQ abscess drain s/p perc dain Minimal OP    Electronically Signed: Brayton El, PA-C 08/21/2017, 11:05 AM   I spent a total of 15 Minutes at the the patient's bedside AND on the patient's hospital floor or unit, greater than 50% of which was counseling/coordinating care for LLQ abscess drain

## 2017-08-21 NOTE — Progress Notes (Signed)
PROGRESS NOTE    Rachel Williams  ZOX:[id number] DOB: 02/02/1951 DOA: 08/15/2017 PCP: Sigmund Hazel, MD   Brief Narrative:  67 year old with a history of chronic diastolic CHF, pulmonary hypertension, active smoker, alcohol abuse, history of bowel perforation status post colectomy, moderate to severe tricuspid regurgitation presented to the hospital with complaints of abdominal pain.  Patient was found to be in septic shock secondary to intra-abdominal abscess versus possible perforated diverticulitis.  CT of the abdomen pelvis showed moderate ascites with 9 cm irregular cystic mass in the left paracolic gutter concerning for abscess.  General surgery was consulted who recommended getting IR guided drain placement which was placed on 5/10.  Due to ascites underwent paracentesis on 5/10 as well.  Cultures ended up growing staph aureus from the drain as well as an narrow therefore patient was started on IV Rocephin and Flagyl and later transitioned to IV meropenem.  Vancomycin was also added.  Case was discussed with Dr. Ninetta Lights from infectious disease who recommended continuing current antibiotics and eventually transitioning to oral Bactrim.  General surgery has been following the patient.   Assessment & Plan:   Principal Problem:   Intra-abdominal abscess s/p perc drainage 08/15/2017 Active Problems:   Hypokalemia   Smoker   Severe sepsis (HCC)   Chronic diastolic CHF (congestive heart failure) (HCC)   Acute respiratory failure (HCC)   Pleural effusion   Hepatic cirrhosis (HCC)   Septic shock secondary to intra-abdominal abscess, improving - Continue patient on current antibiotics.  CT of the abdomen on 5/15 showed significant improvement in the left lateral abscess.  Minimal drain output today.  Did show some loculated ascites therefore IR performed paracentesis on 5/15 with only 250 cc aspirated.  Continue vancomycin at this point as per infectious disease.  Will be low threshold to broaden  If does not improve slowly then maintained requiring formal infectious disease consult as well.  Acute on chronic diastolic CHF Severe pulmonary hypertension Moderate to severe tricuspid regurgitation and bilateral pleural effusions - Patient still appears to be a little volume overloaded.  Chest x-ray suggestive of pulmonary edema and elevated BNP.  We will give 2 doses of 80 mg IV Lasix -Echocardiogram from March 2019 shows ejection fraction 60 to 65%, grade 1 diastolic dysfunction  Hypomagnesemia and hypokalemia -Aggressive repletion today.  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  Needs to quit drinking alcohol and follow-up with outpatient gastroenterology.  Provide supportive care  Tobacco use -Nicotine patch as needed  Physical therapy 4/14 recommended skilled nursing facility  DVT prophylaxis: Subcutaneous heparin Code Status: Full code Family Communication: None at bedside Disposition Plan: To be determined  Consultants:   Interventional radiology  Surgery   Subjective: Feels a little better this morning.  No other acute events overnight.  Review of Systems Otherwise negative except as per HPI, including: HEENT/EYES = negative for pain, redness, loss of vision, double vision, blurred vision, loss of hearing, sore throat, hoarseness, dysphagia Cardiovascular= negative for chest pain, palpitation, murmurs, lower extremity swelling Respiratory/lungs= negative for shortness of breath, cough, hemoptysis, wheezing, mucus production Gastrointestinal= negative for nausea, vomiting,, abdominal pain, melena, hematemesis Genitourinary= negative for Dysuria, Hematuria, Change in Urinary Frequency MSK = Negative for arthralgia, myalgias, Back Pain, Joint swelling  Neurology= Negative for headache, seizures, numbness, tingling  Psychiatry= Negative for anxiety, depression, suicidal and homocidal ideation Allergy/Immunology= Medication/Food  allergy as listed  Skin= Negative for Rash, lesions, ulcers, itching   Objective: Vitals:   08/21/17  1610 08/21/17 0757 08/21/17 0758 08/21/17 0850  BP:  108/66    Pulse:  93  99  Resp:  20  (!) 21  Temp: 99.3 F (37.4 C)  99 F (37.2 C)   TempSrc: Oral  Oral   SpO2:  (!) 88%  91%  Weight:      Height:        Intake/Output Summary (Last 24 hours) at 08/21/2017 1118 Last data filed at 08/21/2017 1006 Gross per 24 hour  Intake 333 ml  Output 3272 ml  Net -2939 ml   Filed Weights   08/15/17 1009 08/15/17 1846  Weight: 46.7 kg (103 lb) 46.7 kg (103 lb)    Examination: Constitutional: NAD, calm, comfortable frail-appearing Eyes: PERRL, lids and conjunctivae normal ENMT: Mucous membranes are moist. Posterior pharynx clear of any exudate or lesions.Normal dentition.  Neck: normal, supple, no masses, no thyromegaly Respiratory: Bilateral crackles midway up the lung fields Cardiovascular: Regular rate and rhythm, no murmurs / rubs / gallops. No extremity edema. 2+ pedal pulses. No carotid bruits.  Abdomen: no tenderness, no masses palpated. No hepatosplenomegaly. Bowel sounds positive.  Drain is in place with sanguinous output noted Musculoskeletal: no clubbing / cyanosis. No joint deformity upper and lower extremities. Good ROM, no contractures. Normal muscle tone.  Skin: no rashes, lesions, ulcers. No induration Neurologic: CN 2-12 grossly intact. Sensation intact, DTR normal. Strength 5/5 in all 4.  Psychiatric: Normal judgment and insight. Alert and oriented x 3. Normal mood.    Data Reviewed:   CBC: Recent Labs  Lab 08/15/17 1002  08/16/17 0452 08/17/17 0422 08/18/17 0546 08/20/17 0326 08/21/17 0415  WBC 35.5*   < > 35.0* 23.3* 19.9* 14.9* 14.8*  NEUTROABS 33.7*  --   --   --   --   --   --   HGB 13.1   < > 11.0* 9.8* 10.2* 11.2* 11.5*  HCT 38.3   < > 34.2* 30.4* 31.8* 35.5* 35.2*  MCV 83.4   < > 84.0 81.1 82.4 84.5 82.4  PLT 303   < > 293 243 229 192 199   < >  = values in this interval not displayed.   Basic Metabolic Panel: Recent Labs  Lab 08/15/17 1938 08/16/17 0452 08/16/17 1554 08/17/17 0422 08/18/17 0546 08/20/17 0326 08/21/17 0415  NA  --  142  --  139 142 139 135  K  --  4.1  --  3.8 3.8 3.8 3.4*  CL  --  100*  --  103 107 103 97*  CO2  --  28  --  GLUCOSE  --  45*  --  64* 95 92 100*  BUN  --  46*  --  55* 43* 12 5*  CREATININE  --  1.42*  --  1.62* 0.80 0.39* 0.39*  CALCIUM  --  9.1  --  9.1 9.4 8.7* 8.1*  MG 1.9 1.9  --  1.9  --  1.0* 0.9*  PHOS  --   --  3.8  --   --   --   --    GFR: Estimated Creatinine Clearance: 48.9 mL/min (A) (by C-G formula based on SCr of 0.39 mg/dL (L)). Liver Function Tests: Recent Labs  Lab 08/15/17 1002 08/16/17 0452 08/17/17 0422 08/21/17 0415  AST 31 32 25 27  ALT 13* 12* 11* 9*  ALKPHOS 194* 150* 120 136*  BILITOT 3.0* 1.8* 1.6* 1.4*  PROT 7.2 5.9* 5.8* 5.0*  ALBUMIN 2.3* 1.9* 2.4* 2.0*   Recent Labs  Lab 08/15/17 1002  LIPASE 18   No results for input(s): AMMONIA in the last 168 hours. Coagulation Profile: Recent Labs  Lab 08/15/17 1938  INR 1.65   Cardiac Enzymes: No results for input(s): CKTOTAL, CKMB, CKMBINDEX, TROPONINI in the last 168 hours. BNP (last 3 results) No results for input(s): PROBNP in the last 8760 hours. HbA1C: No results for input(s): HGBA1C in the last 72 hours. CBG: Recent Labs  Lab 08/20/17 1654 08/20/17 2049 08/21/17 0029 08/21/17 0511 08/21/17 0811  GLUCAP 137* 113* 107* 93 104*   Lipid Profile: No results for input(s): CHOL, HDL, LDLCALC, TRIG, CHOLHDL, LDLDIRECT in the last 72 hours. Thyroid Function Tests: No results for input(s): TSH, T4TOTAL, FREET4, T3FREE, THYROIDAB in the last 72 hours. Anemia Panel: No results for input(s): VITAMINB12, FOLATE, FERRITIN, TIBC, IRON, RETICCTPCT in the last 72 hours. Sepsis Labs: Recent Labs  Lab 08/15/17 1033 08/15/17 1332 08/16/17 1546 08/16/17 1904  LATICACIDVEN 2.33*  1.43 1.3 1.2    Recent Results (from the past 240 hour(s))  Blood Culture (routine x 2)     Status: None   Collection Time: 08/15/17 10:14 AM  Result Value Ref Range Status   Specimen Description BLOOD RIGHT ANTECUBITAL  Final   Special Requests   Final    BOTTLES DRAWN AEROBIC AND ANAEROBIC Blood Culture adequate volume   Culture   Final    NO GROWTH 5 DAYS Performed at [facility], [address] [place], [place] [address]    Report Status 08/20/2017 FINAL  Final  Blood Culture (routine x 2)     Status: None   Collection Time: 08/15/17 10:51 AM  Result Value Ref Range Status   Specimen Description BLOOD LEFT WRIST  Final   Special Requests   Final    BOTTLES DRAWN AEROBIC AND ANAEROBIC Blood Culture adequate volume   Culture   Final    NO GROWTH 5 DAYS Performed at [facility], [address] [place], [place] [address]    Report Status 08/20/2017 FINAL  Final  Aerobic/Anaerobic Culture (surgical/deep wound)     Status: None (Preliminary result)   Collection Time: 08/15/17  4:42 PM  Result Value Ref Range Status   Specimen Description ABSCESS  Final   Special Requests PARACENTESIS  Final   Gram Stain   Final    ABUNDANT WBC PRESENT, PREDOMINANTLY PMN ABUNDANT GRAM POSITIVE COCCI Performed at [facility], [address], [place], [place] [address]    Culture   Final    ABUNDANT METHICILLIN RESISTANT STAPHYLOCOCCUS AUREUS NO ANAEROBES ISOLATED; CULTURE IN PROGRESS FOR 5 DAYS    Report Status PENDING  Incomplete   Organism ID, Bacteria METHICILLIN RESISTANT STAPHYLOCOCCUS AUREUS  Final      Susceptibility   Methicillin resistant staphylococcus aureus - MIC*    CIPROFLOXACIN >=8 RESISTANT Resistant     ERYTHROMYCIN >=8 RESISTANT Resistant     GENTAMICIN <=0.5 SENSITIVE Sensitive     OXACILLIN >=4 RESISTANT Resistant     TETRACYCLINE <=1 SENSITIVE Sensitive     VANCOMYCIN 1 SENSITIVE Sensitive     TRIMETH/SULFA <=10 SENSITIVE Sensitive      CLINDAMYCIN RESISTANT Resistant     RIFAMPIN <=0.5 SENSITIVE Sensitive     Inducible Clindamycin POSITIVE Resistant     * ABUNDANT METHICILLIN RESISTANT STAPHYLOCOCCUS AUREUS  MRSA PCR Screening     Status: None   Collection Time: 08/16/17  4:58 AM  Result Value Ref Range Status   MRSA by PCR NEGATIVE NEGATIVE Final    Comment:        The GeneXpert MRSA Assay (FDA approved for NASAL specimens only), is one component of a comprehensive MRSA colonization surveillance program. It is not intended to diagnose MRSA infection nor to guide or monitor treatment for MRSA infections. Performed at [facility], [address] [place], [place] [address]   Gram stain     Status: None   Collection Time: 08/20/17 12:41 PM  Result Value Ref Range Status   Specimen Description PERITONEAL  Final   Special Requests NONE  Final   Gram Stain   Final    FEW WBC PRESENT, PREDOMINANTLY PMN NO ORGANISMS SEEN Performed at [facility], [address] [place], [place] [address]    Report Status 08/20/2017 FINAL  Final         Radiology Studies: Ct Abdomen Pelvis W Contrast  Result Date: 08/20/2017 CLINICAL DATA:  Abdominal pain, fever.  Follow-up abscess EXAM: CT ABDOMEN AND PELVIS WITH CONTRAST TECHNIQUE: Multidetector CT imaging of the abdomen and pelvis was performed using the standard protocol following bolus administration of intravenous contrast. CONTRAST:  100 mL ISOVUE-300 IOPAMIDOL (ISOVUE-300) INJECTION 61% COMPARISON:  08/15/2017 FINDINGS: Lower chest: New small bilateral pleural effusions with compressive atelectasis in the lower lobes. Heart is mildly enlarged. Hepatobiliary: Subtle nodularity to the liver surface. Enlargement of the caudate lobe. Diffuse hypodensity throughout the liver. Findings likely reflect cirrhosis. No focal hepatic abnormality. Gallbladder unremarkable. There appears to be pneumobilia within the distal common bile duct. Pancreas: No focal abnormality or  ductal dilatation. Spleen: No focal abnormality.  Normal size. Adrenals/Urinary Tract: No adrenal abnormality. No focal renal abnormality. No stones or hydronephrosis. Urinary bladder is unremarkable. Stomach/Bowel: No evidence of bowel obstruction. Postoperative changes in the sigmoid colon. Distal gastric wall again appears to be edematous, similar to prior study. Vascular/Lymphatic: Aortic atherosclerosis. No enlarged abdominal or pelvic lymph nodes. Reproductive: IUD noted within the uterus.  No adnexal mass. Other: Moderate to large volume ascites noted throughout the abdomen and pelvis which appears loculated throughout the abdomen and pelvis. This is increased significantly since prior study. Previously seen left abdominal abscess has decreased significantly in size with pigtail drainage catheter in place. The area currently measures 6.1 x 1.7 cm compared to approximately 9.1 x 4.9 cm previously. Musculoskeletal: No acute bony abnormality. Chronic compression fractures in the lower thoracic and upper lumbar spine. Diffuse degenerative disc and facet disease. IMPRESSION: Significant improvement in the left lateral abdominal abscess with pigtail drainage catheter in place, measurements as above. Only a small amount of drainable fluid remains. Significant increase in loculated moderate to large volume ascites throughout the abdomen and pelvis. Suspect changes of cirrhosis with heterogeneous low-density throughout the liver and mild nodular contours. New small bilateral pleural effusions, compressive atelectasis in the lower lobes. Electronically Signed   By: Charlett Nose M.D.   On: 08/20/2017 10:40   Dg Chest Port 1 View  Result Date: 08/21/2017 CLINICAL DATA:  Shortness of breath cough.  Congestion. EXAM: PORTABLE CHEST 1 VIEW COMPARISON:  08/18/2017. FINDINGS: Cardiomegaly with bilateral pulmonary infiltrates/edema and bilateral pleural effusions. Similar findings noted on prior exams. Findings consistent  with congestive heart failure with pulmonary edema. Bilateral pneumonia could also present this fashion. No significant interim change from prior exam. IMPRESSION: Findings consistent with congestive heart failure with bilateral pulmonary edema bilateral pleural effusions. No significant interim change from prior  exam. Electronically Signed   By: Maisie Fus  Register   On: 08/21/2017 10:06   Ir Paracentesis  Result Date: 08/20/2017 INDICATION: Patient with intra-abdominal abscess, cirrhosis, loculated ascites. Request made for diagnostic and therapeutic paracentesis. EXAM: ULTRASOUND GUIDED DIAGNOSTIC AND THERAPEUTIC PARACENTESIS MEDICATIONS: None COMPLICATIONS: None immediate. PROCEDURE: Informed written consent was obtained from the patient after a discussion of the risks, benefits and alternatives to treatment. A timeout was performed prior to the initiation of the procedure. Initial ultrasound scanning demonstrates a moderate amount of loculated ascites within the left lower abdominal quadrant. The left lower abdomen was prepped and draped in the usual sterile fashion. 1% lidocaine was used for local anesthesia. Following this, a 19 gauge, 7-cm, Yueh catheter was introduced. An ultrasound image was saved for documentation purposes. The paracentesis was performed. The catheter was removed and a dressing was applied. The patient tolerated the procedure well without immediate post procedural complication. FINDINGS: A total of approximately 250 cc of turbid, yellow fluid was removed. Samples were sent to the laboratory as requested by the clinical team. Due to significant loculated nature of ascites only the above amount of fluid could be removed today. IMPRESSION: Successful ultrasound-guided diagnostic/therapeutic paracentesis yielding 250 cc of peritoneal fluid. Read by: Jeananne Rama, PA-C Electronically Signed   By: Simonne Come M.D.   On: 08/20/2017 12:44        Scheduled Meds: . benzonatate  100 mg Oral  BID  . Chlorhexidine Gluconate Cloth  6 each Topical Q0600  . enoxaparin (LOVENOX) injection  30 mg Subcutaneous Q24H  . feeding supplement (ENSURE ENLIVE)  237 mL Oral BID BM  . furosemide  80 mg Intravenous Q8H  . gabapentin  300 mg Oral QHS  . guaiFENesin  600 mg Oral BID  . mupirocin ointment  1 application Nasal BID  . nicotine  14 mg Transdermal Daily  . potassium chloride  20 mEq Oral Once  . sodium chloride flush  5 mL Intracatheter Q8H   Continuous Infusions: . dextrose 5 % and 0.45 % NaCl with KCl 20 mEq/L 30 mL/hr at 08/19/17 2214  . methocarbamol (ROBAXIN)  IV    . potassium chloride 10 mEq (08/21/17 1046)  . vancomycin 750 mg (08/21/17 1043)     LOS: 6 days    I have spent 35 minutes face to face with the patient and on the ward discussing the patients care, assessment, plan and disposition with other care givers. >50% of the time was devoted counseling the patient about the risks and benefits of treatment and coordinating care.     Ankit Joline Maxcy, MD [facility] Pager [phone]   If 7PM-7AM, please contact night-coverage [email or web address] Password [email or web address] 08/21/2017, 11:18 AM

## 2017-08-22 LAB — CBC
HEMATOCRIT: 41.4 % (ref 36.0–46.0)
Hemoglobin: 13.3 g/dL (ref 12.0–15.0)
MCH: 26.8 pg (ref 26.0–34.0)
MCHC: 32.1 g/dL (ref 30.0–36.0)
MCV: 83.3 fL (ref 78.0–100.0)
Platelets: 261 10*3/uL (ref 150–400)
RBC: 4.97 MIL/uL (ref 3.87–5.11)
RDW: 15.2 % (ref 11.5–15.5)
WBC: 20.1 10*3/uL — AB (ref 4.0–10.5)

## 2017-08-22 LAB — COMPREHENSIVE METABOLIC PANEL
ALBUMIN: 2.2 g/dL — AB (ref 3.5–5.0)
ALK PHOS: 141 U/L — AB (ref 38–126)
ALT: 9 U/L — ABNORMAL LOW (ref 14–54)
ANION GAP: 9 (ref 5–15)
AST: 30 U/L (ref 15–41)
BILIRUBIN TOTAL: 1.3 mg/dL — AB (ref 0.3–1.2)
BUN: 5 mg/dL — ABNORMAL LOW (ref 6–20)
CALCIUM: 8.5 mg/dL — AB (ref 8.9–10.3)
CO2: 34 mmol/L — AB (ref 22–32)
Chloride: 91 mmol/L — ABNORMAL LOW (ref 101–111)
Creatinine, Ser: 0.54 mg/dL (ref 0.44–1.00)
GFR calc Af Amer: >60 mL/min (ref >60)
GFR calc non Af Amer: >60 mL/min (ref >60)
GLUCOSE: 98 mg/dL (ref 65–99)
POTASSIUM: 4.3 mmol/L (ref 3.5–5.1)
SODIUM: 134 mmol/L — AB (ref 135–145)
TOTAL PROTEIN: 6.1 g/dL — AB (ref 6.5–8.1)

## 2017-08-22 LAB — GLUCOSE, CAPILLARY
GLUCOSE-CAPILLARY: 86 mg/dL (ref 65–99)
GLUCOSE-CAPILLARY: 91 mg/dL (ref 65–99)
Glucose-Capillary: 100 mg/dL — ABNORMAL HIGH (ref 65–99)
Glucose-Capillary: 119 mg/dL — ABNORMAL HIGH (ref 65–99)

## 2017-08-22 LAB — MAGNESIUM: Magnesium: 1.5 mg/dL — ABNORMAL LOW (ref 1.7–2.4)

## 2017-08-22 MED ORDER — BOOST / RESOURCE BREEZE PO LIQD CUSTOM
1.0000 | Freq: Three times a day (TID) | ORAL | Status: DC
  Administered 2017-08-22 – 2017-08-28 (×9): 1 via ORAL

## 2017-08-22 MED ORDER — MAGNESIUM SULFATE 2 GM/50ML IV SOLN
2.0000 g | Freq: Once | INTRAVENOUS | Status: AC
  Administered 2017-08-22: 2 g via INTRAVENOUS
  Filled 2017-08-22 (×2): qty 50

## 2017-08-22 NOTE — Progress Notes (Signed)
Physical Therapy Treatment Patient Details Name: Rachel Williams MRN: [id number] DOB: [birth year] Today's Date: 08/22/2017    History of Present Illness 67 year old female with past medical history of alcohol abuse, prior pneumonia, smoking and admitted for  CHF exacerbation and bacterial peritonitis, hypotension, MRSA.  Had drainage of abscess in abdomen percutaneously on 08/15/17.  PMHx: tricuspid regurg, CHF Pulm HTN, colostomy with reversal    PT Comments    Patient agreeable to bed level therex despite max encouragement for OOB mobility. Pt tolerated session well and encouraged work on therex independently again today and over the weekend. Continue to progress as tolerated with anticipated d/c to SNF for further skilled PT services.     Follow Up Recommendations  SNF     Equipment Recommendations  None recommended by PT    Recommendations for Other Services       Precautions / Restrictions Precautions Precautions: Fall Restrictions Weight Bearing Restrictions: No    Mobility  Bed Mobility               General bed mobility comments: NA   Transfers                 General transfer comment: NA pt declined OOB mobility  Ambulation/Gait             General Gait Details: NA   Stairs             Wheelchair Mobility    Modified Rankin (Stroke Patients Only)       Balance                                            Cognition Arousal/Alertness: Awake/alert Behavior During Therapy: WFL for tasks assessed/performed Overall Cognitive Status: Within Functional Limits for tasks assessed                                        Exercises General Exercises - Lower Extremity Ankle Circles/Pumps: AROM;20 reps Quad Sets: AROM;15 reps Short Arc Quad: AROM;Both;10 reps Heel Slides: AROM;Both;10 reps Hip ABduction/ADduction: AROM;Both;10 reps Other Exercises Other Exercises: bridging X10    General Comments         Pertinent Vitals/Pain Pain Assessment: Faces Faces Pain Scale: No hurt    Home Living                      Prior Function            PT Goals (current goals can now be found in the care plan section) Acute Rehab PT Goals Patient Stated Goal: to get stronger PT Goal Formulation: With patient Time For Goal Achievement: 09/02/17 Potential to Achieve Goals: Good Progress towards PT goals: Progressing toward goals    Frequency    Min 2X/week      PT Plan Current plan remains appropriate    Co-evaluation              AM-PAC PT "6 Clicks" Daily Activity  Outcome Measure  Difficulty turning over in bed (including adjusting bedclothes, sheets and blankets)?: Unable Difficulty moving from lying on back to sitting on the side of the bed? : Unable Difficulty sitting down on and standing up from a chair with arms (e.g., wheelchair, bedside commode, etc,.)?: Unable Help needed  moving to and from a bed to chair (including a wheelchair)?: A Lot Help needed walking in hospital room?: Total Help needed climbing 3-5 steps with a railing? : Total 6 Click Score: 7    End of Session Equipment Utilized During Treatment: Oxygen Activity Tolerance: Patient tolerated treatment well Patient left: in bed;with call bell/phone within reach;with bed alarm set Nurse Communication: Mobility status PT Visit Diagnosis: Unsteadiness on feet (R26.81);Other abnormalities of gait and mobility (R26.89);Muscle weakness (generalized) (M62.81);Difficulty in walking, not elsewhere classified (R26.2);Adult, failure to thrive (R62.7)     Time: 1610-9604 PT Time Calculation (min) (ACUTE ONLY): 23 min  Charges:  $Therapeutic Exercise: 23-37 mins                    G Codes:       Erline Levine, PTA Pager: [phone]     Rachel Williams 08/22/2017, 2:08 PM

## 2017-08-22 NOTE — Progress Notes (Signed)
PROGRESS NOTE    Rachel Williams  QVZ:[id number] DOB: [birth year] DOA: 08/15/2017 PCP: Sigmund Hazel, MD   Brief Narrative:  68 year old with a history of chronic diastolic CHF, pulmonary hypertension, active smoker, alcohol abuse, history of bowel perforation status post colectomy, moderate to severe tricuspid regurgitation presented to the hospital with complaints of abdominal pain.  Patient was found to be in septic shock secondary to intra-abdominal abscess versus possible perforated diverticulitis.  CT of the abdomen pelvis showed moderate ascites with 9 cm irregular cystic mass in the left paracolic gutter concerning for abscess.  General surgery was consulted who recommended getting IR guided drain placement which was placed on 5/10.  Due to ascites underwent paracentesis on 5/10 as well.  Cultures ended up growing staph aureus from the drain as well as an narrow therefore patient was started on IV Rocephin and Flagyl and later transitioned to IV meropenem.  Vancomycin was also added.  Case was discussed with Dr. Ninetta Lights from infectious disease who recommended continuing current antibiotics and eventually transitioning to oral Bactrim.  General surgery has been following the patient.   Assessment & Plan:   Principal Problem:   Intra-abdominal abscess s/p perc drainage 08/15/2017 Active Problems:   Hypokalemia   Smoker   Severe sepsis (HCC)   Chronic diastolic CHF (congestive heart failure) (HCC)   Acute respiratory failure (HCC)   Pleural effusion   Hepatic cirrhosis (HCC)   Septic shock secondary to intra-abdominal abscess, improving - Patient is currently only on vancomycin per recommendations by infectious disease.  White count is slightly trended up, if it continues to rise I will add meropenem.  CT of the abdomen pelvis from 5/15 shows improvement in left lateral wall abscess with some loculated ascites.  Only 250 cc is aspirated on 5/15.  This morning has yellowish clear drainage from  her drain.  General surgery and interventional radiology are following.  Acute on chronic diastolic CHF Severe pulmonary hypertension Moderate to severe tricuspid regurgitation and bilateral pleural effusions - She has some signs of volume overload and chest x-ray suggestive of pulmonary edema therefore received 2 days of IV Lasix 80 mg yesterday.  She responded well to it.  At this point would not further diuresis or as her blood pressure is somewhat soft.  She is saturating greater than 90% on room air. 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  Needs to quit drinking alcohol and follow-up with outpatient gastroenterology.  Provide supportive care  Tobacco use -Nicotine patch as needed  Physical therapy 4/14 recommended skilled nursing facility  DVT prophylaxis: Subcutaneous heparin Code Status: Full code Family Communication: None at bedside but updated patient's daughter over the phone Disposition Plan: At this point I would anticipate being here in the hospital over the weekend.  Consultants:   Interventional radiology  Surgery   Subjective: Feels little better this morning.  She was able to ambulate yesterday with assistance of nurse but still appears quite weak.  Review of Systems Otherwise negative except as per HPI, including: HEENT/EYES = negative for pain, redness, loss of vision, double vision, blurred vision, loss of hearing, sore throat, hoarseness, dysphagia Cardiovascular= negative for chest pain, palpitation, murmurs, lower extremity swelling Respiratory/lungs= negative for shortness of breath, cough, hemoptysis, wheezing, mucus production Gastrointestinal= negative for nausea, vomiting,, abdominal pain, melena, hematemesis Genitourinary= negative for Dysuria,  Hematuria, Change in Urinary Frequency MSK = Negative for arthralgia, myalgias, Back Pain, Joint swelling  Neurology= Negative for headache, seizures, numbness, tingling  Psychiatry= Negative for anxiety, depression, suicidal and homocidal ideation Allergy/Immunology= Medication/Food allergy as listed  Skin= Negative for Rash, lesions, ulcers, itching    Objective: Vitals:   08/22/17 0400 08/22/17 0630 08/22/17 0731 08/22/17 1128  BP:  (!) 93/59 109/71 90/63  Pulse: (!) 103 89 (!) 101 97  Resp:  Temp:  98.5 F (36.9 C) 98.6 F (37 C) 98.5 F (36.9 C)  TempSrc:   Oral Oral  SpO2: 96% 98% 94% 94%  Weight:      Height:        Intake/Output Summary (Last 24 hours) at 08/22/2017 1323 Last data filed at 08/22/2017 1306 Gross per 24 hour  Intake 1838.5 ml  Output 1625 ml  Net 213.5 ml   Filed Weights   08/15/17 1009 08/15/17 1846  Weight: 46.7 kg (103 lb) 46.7 kg (103 lb)    Examination: Constitutional: NAD, calm, comfortable elderly frail-appearing Eyes: PERRL, lids and conjunctivae normal ENMT: Mucous membranes are moist. Posterior pharynx clear of any exudate or lesions.Normal dentition.  Neck: normal, supple, no masses, no thyromegaly Respiratory: Bilateral crackles at the bases-improved from yesterday Cardiovascular: Regular rate and rhythm, no murmurs / rubs / gallops. No extremity edema. 2+ pedal pulses. No carotid bruits.  Abdomen: no tenderness, no masses palpated. No hepatosplenomegaly. Bowel sounds positive.  Abdominal drain in place with yellowish clear output Musculoskeletal: no clubbing / cyanosis. No joint deformity upper and lower extremities. Good ROM, no contractures. Normal muscle tone.  Skin: no rashes, lesions, ulcers. No induration Neurologic: CN 2-12 grossly intact. Sensation intact, DTR normal. Strength 5/5 in all 4.  Psychiatric: Normal judgment and insight. Alert and oriented x 3. Normal mood.    Data Reviewed:   CBC: Recent Labs  Lab  08/17/17 0422 08/18/17 0546 08/20/17 0326 08/21/17 0415 08/22/17 0544  WBC 23.3* 19.9* 14.9* 14.8* 20.1*  HGB 9.8* 10.2* 11.2* 11.5* 13.3  HCT 30.4* 31.8* 35.5* 35.2* 41.4  MCV 81.1 82.4 84.5 82.4 83.3  PLT 243 229 192 199 261   Basic Metabolic Panel: Recent Labs  Lab 08/16/17 0452 08/16/17 1554 08/17/17 0422 08/18/17 0546 08/20/17 0326 08/21/17 0415 08/22/17 0544  NA 142  --  139 142 139 135 134*  K 4.1  --  3.8 3.8 3.8 3.4* 4.3  CL 100*  --  103 107 103 97* 91*  CO2 28  --  34*  GLUCOSE 45*  --  64* 95 92 100* 98  BUN 46*  --  55* 43* 12 5* 5*  CREATININE 1.42*  --  1.62* 0.80 0.39* 0.39* 0.54  CALCIUM 9.1  --  9.1 9.4 8.7* 8.1* 8.5*  MG 1.9  --  1.9  --  1.0* 0.9* 1.5*  PHOS  --  3.8  --   --   --   --   --    GFR: Estimated Creatinine Clearance: 48.9 mL/min (by C-G formula based on SCr of 0.54 mg/dL). Liver Function Tests: Recent Labs  Lab 08/16/17 0452 08/17/17 0422 08/21/17 0415 08/22/17 0544  AST 32 ALT 12* 11* 9* 9*  ALKPHOS 150* 120 136* 141*  BILITOT 1.8* 1.6* 1.4* 1.3*  PROT 5.9* 5.8* 5.0* 6.1*  ALBUMIN 1.9* 2.4* 2.0* 2.2*   No results for input(s): LIPASE, AMYLASE in the last 168 hours. No  results for input(s): AMMONIA in the last 168 hours. Coagulation Profile: Recent Labs  Lab 08/15/17 1938  INR 1.65   Cardiac Enzymes: No results for input(s): CKTOTAL, CKMB, CKMBINDEX, TROPONINI in the last 168 hours. BNP (last 3 results) No results for input(s): PROBNP in the last 8760 hours. HbA1C: No results for input(s): HGBA1C in the last 72 hours. CBG: Recent Labs  Lab 08/21/17 2009 08/22/17 0001 08/22/17 0344 08/22/17 0819 08/22/17 1158  GLUCAP 126* 100* 86 91 119*   Lipid Profile: No results for input(s): CHOL, HDL, LDLCALC, TRIG, CHOLHDL, LDLDIRECT in the last 72 hours. Thyroid Function Tests: No results for input(s): TSH, T4TOTAL, FREET4, T3FREE, THYROIDAB in the last 72 hours. Anemia Panel: No results for  input(s): VITAMINB12, FOLATE, FERRITIN, TIBC, IRON, RETICCTPCT in the last 72 hours. Sepsis Labs: Recent Labs  Lab 08/15/17 1332 08/16/17 1546 08/16/17 1904  LATICACIDVEN 1.43 1.3 1.2    Recent Results (from the past 240 hour(s))  Blood Culture (routine x 2)     Status: None   Collection Time: 08/15/17 10:14 AM  Result Value Ref Range Status   Specimen Description BLOOD RIGHT ANTECUBITAL  Final   Special Requests   Final    BOTTLES DRAWN AEROBIC AND ANAEROBIC Blood Culture adequate volume   Culture   Final    NO GROWTH 5 DAYS Performed at [facility], [address], [place], [place] [address]    Report Status 08/20/2017 FINAL  Final  Blood Culture (routine x 2)     Status: None   Collection Time: 08/15/17 10:51 AM  Result Value Ref Range Status   Specimen Description BLOOD LEFT WRIST  Final   Special Requests   Final    BOTTLES DRAWN AEROBIC AND ANAEROBIC Blood Culture adequate volume   Culture   Final    NO GROWTH 5 DAYS Performed at [facility], [address], [place], [place] [address]    Report Status 08/20/2017 FINAL  Final  Aerobic/Anaerobic Culture (surgical/deep wound)     Status: None   Collection Time: 08/15/17  4:42 PM  Result Value Ref Range Status   Specimen Description ABSCESS  Final   Special Requests PARACENTESIS  Final   Gram Stain   Final    ABUNDANT WBC PRESENT, PREDOMINANTLY PMN ABUNDANT GRAM POSITIVE COCCI    Culture   Final    ABUNDANT METHICILLIN RESISTANT STAPHYLOCOCCUS AUREUS NO ANAEROBES ISOLATED Performed at [facility], [address], [place], [place] [address]    Report Status 08/21/2017 FINAL  Final   Organism ID, Bacteria METHICILLIN RESISTANT STAPHYLOCOCCUS AUREUS  Final      Susceptibility   Methicillin resistant staphylococcus aureus - MIC*    CIPROFLOXACIN >=8 RESISTANT Resistant     ERYTHROMYCIN >=8 RESISTANT Resistant     GENTAMICIN <=0.5 SENSITIVE Sensitive     OXACILLIN >=4 RESISTANT Resistant      TETRACYCLINE <=1 SENSITIVE Sensitive     VANCOMYCIN 1 SENSITIVE Sensitive     TRIMETH/SULFA <=10 SENSITIVE Sensitive     CLINDAMYCIN RESISTANT Resistant     RIFAMPIN <=0.5 SENSITIVE Sensitive     Inducible Clindamycin POSITIVE Resistant     * ABUNDANT METHICILLIN RESISTANT STAPHYLOCOCCUS AUREUS  MRSA PCR Screening     Status: None   Collection Time: 08/16/17  4:58 AM  Result Value Ref Range Status   MRSA by PCR NEGATIVE NEGATIVE Final    Comment:        The GeneXpert MRSA Assay (  FDA approved for NASAL specimens only), is one component of a comprehensive MRSA colonization surveillance program. It is not intended to diagnose MRSA infection nor to guide or monitor treatment for MRSA infections. Performed at [facility], [address], [place], [place] [address]   Culture, body fluid-bottle     Status: None (Preliminary result)   Collection Time: 08/20/17 12:41 PM  Result Value Ref Range Status   Specimen Description PERITONEAL  Final   Special Requests NONE  Final   Culture   Final    NO GROWTH 2 DAYS Performed at [facility], [address], [place], [place] [address]    Report Status PENDING  Incomplete  Gram stain     Status: None   Collection Time: 08/20/17 12:41 PM  Result Value Ref Range Status   Specimen Description PERITONEAL  Final   Special Requests NONE  Final   Gram Stain   Final    FEW WBC PRESENT, PREDOMINANTLY PMN NO ORGANISMS SEEN Performed at [facility], [address] [place], [place] [address]    Report Status 08/20/2017 FINAL  Final         Radiology Studies: Dg Chest Port 1 View  Result Date: 08/21/2017 CLINICAL DATA:  Shortness of breath cough.  Congestion. EXAM: PORTABLE CHEST 1 VIEW COMPARISON:  08/18/2017. FINDINGS: Cardiomegaly with bilateral pulmonary infiltrates/edema and bilateral pleural effusions. Similar findings noted on prior exams. Findings consistent with congestive heart failure with pulmonary edema.  Bilateral pneumonia could also present this fashion. No significant interim change from prior exam. IMPRESSION: Findings consistent with congestive heart failure with bilateral pulmonary edema bilateral pleural effusions. No significant interim change from prior exam. Electronically Signed   By: Maisie Fus  Register   On: 08/21/2017 10:06        Scheduled Meds: . benzonatate  100 mg Oral BID  . Chlorhexidine Gluconate Cloth  6 each Topical Q0600  . enoxaparin (LOVENOX) injection  30 mg Subcutaneous Q24H  . feeding supplement  1 Container Oral TID BM  . guaiFENesin  600 mg Oral BID  . mupirocin ointment  1 application Nasal BID  . nicotine  14 mg Transdermal Daily  . sodium chloride flush  5 mL Intracatheter Q8H   Continuous Infusions: . dextrose 5 % and 0.45 % NaCl with KCl 20 mEq/L 1,000 mL (08/22/17 0820)  . methocarbamol (ROBAXIN)  IV    . vancomycin Stopped (08/22/17 0922)     LOS: 7 days    I have spent 35 minutes face to face with the patient and on the ward discussing the patients care, assessment, plan and disposition with other care givers. >50% of the time was devoted counseling the patient about the risks and benefits of treatment and coordinating care.     Taedyn Glasscock Joline Maxcy, MD [facility] Pager [phone]   If 7PM-7AM, please contact night-coverage [email or web address] Password [email or web address] 08/22/2017, 1:23 PM

## 2017-08-22 NOTE — Progress Notes (Signed)
Referring Physician(s): Gross, S  Supervising Physician: Oley Balm  Patient Status:  [facility] - In-pt  Chief Complaint: "Drain check"  Subjective:  S/p percutaneous abscess drain placement 08/15/2017 by Dr. Archer Asa. Patient awake and alert laying in bed with no complaints. Denies fever or abdominal pain/tenderness.  Allergies: Ciprofloxacin; Levofloxacin; Nickel; and Penicillins  Medications: Prior to Admission medications   Medication Sig Start Date End Date Taking? Authorizing Provider  acetaminophen (TYLENOL) 500 MG tablet Take 1,000 mg by mouth as needed for mild pain.   Yes [provider]  folic acid (FOLVITE) 1 MG tablet Take 1 tablet (1 mg total) by mouth daily. 06/26/17  Yes Burnadette Pop, MD  furosemide (LASIX) 40 MG tablet Take 1 tablet (40 mg total) by mouth daily. 06/27/17  Yes Burnadette Pop, MD  naproxen sodium (ALEVE) 220 MG tablet Take 440 mg by mouth daily as needed (pain).   Yes [provider]  Potassium Chloride ER 20 MEQ TBCR Take 20 mEq by mouth daily. 06/26/17  Yes Burnadette Pop, MD  spironolactone (ALDACTONE) 25 MG tablet Take 0.5 tablets (12.5 mg total) by mouth daily. 07/30/17 10/28/17 Yes Bensimhon, Bevelyn Buckles, MD  thiamine 100 MG tablet Take 1 tablet (100 mg total) by mouth daily. Patient not taking: Reported on 07/30/2017 06/26/17   Burnadette Pop, MD     Vital Signs: BP 90/63 (BP Location: Right Arm)   Pulse 97   Temp 98.5 F (36.9 C) (Oral)   Resp 20   Ht  (1.575 m)   Wt 103 lb (46.7 kg)   SpO2 94%   BMI 18.84 kg/m   Physical Exam  Constitutional: She is oriented to person, place, and time. She appears well-developed and well-nourished. No distress.  Pulmonary/Chest: Effort normal. No respiratory distress.  Neurological: She is alert and oriented to person, place, and time.  Skin: Skin is warm and dry.  Abdominal LLQ at site of percutaneous abscess drain c/d/i without active bleeding, erythema, or tenderness.    Psychiatric: She has a normal mood and affect. Her behavior is normal. Judgment and thought content normal.  Nursing note and vitals reviewed.   Imaging: Ct Abdomen Pelvis W Contrast  Result Date: 08/20/2017 CLINICAL DATA:  Abdominal pain, fever.  Follow-up abscess EXAM: CT ABDOMEN AND PELVIS WITH CONTRAST TECHNIQUE: Multidetector CT imaging of the abdomen and pelvis was performed using the standard protocol following bolus administration of intravenous contrast. CONTRAST:  100 mL ISOVUE-300 IOPAMIDOL (ISOVUE-300) INJECTION 61% COMPARISON:  08/15/2017 FINDINGS: Lower chest: New small bilateral pleural effusions with compressive atelectasis in the lower lobes. Heart is mildly enlarged. Hepatobiliary: Subtle nodularity to the liver surface. Enlargement of the caudate lobe. Diffuse hypodensity throughout the liver. Findings likely reflect cirrhosis. No focal hepatic abnormality. Gallbladder unremarkable. There appears to be pneumobilia within the distal common bile duct. Pancreas: No focal abnormality or ductal dilatation. Spleen: No focal abnormality.  Normal size. Adrenals/Urinary Tract: No adrenal abnormality. No focal renal abnormality. No stones or hydronephrosis. Urinary bladder is unremarkable. Stomach/Bowel: No evidence of bowel obstruction. Postoperative changes in the sigmoid colon. Distal gastric wall again appears to be edematous, similar to prior study. Vascular/Lymphatic: Aortic atherosclerosis. No enlarged abdominal or pelvic lymph nodes. Reproductive: IUD noted within the uterus.  No adnexal mass. Other: Moderate to large volume ascites noted throughout the abdomen and pelvis which appears loculated throughout the abdomen and pelvis. This is increased significantly since prior study. Previously seen left abdominal abscess has decreased significantly in size with pigtail  drainage catheter in place. The area currently measures 6.1 x 1.7 cm compared to approximately 9.1 x 4.9 cm previously.  Musculoskeletal: No acute bony abnormality. Chronic compression fractures in the lower thoracic and upper lumbar spine. Diffuse degenerative disc and facet disease. IMPRESSION: Significant improvement in the left lateral abdominal abscess with pigtail drainage catheter in place, measurements as above. Only a small amount of drainable fluid remains. Significant increase in loculated moderate to large volume ascites throughout the abdomen and pelvis. Suspect changes of cirrhosis with heterogeneous low-density throughout the liver and mild nodular contours. New small bilateral pleural effusions, compressive atelectasis in the lower lobes. Electronically Signed   By: Charlett Nose M.D.   On: 08/20/2017 10:40   Dg Chest Port 1 View  Result Date: 08/21/2017 CLINICAL DATA:  Shortness of breath cough.  Congestion. EXAM: PORTABLE CHEST 1 VIEW COMPARISON:  08/18/2017. FINDINGS: Cardiomegaly with bilateral pulmonary infiltrates/edema and bilateral pleural effusions. Similar findings noted on prior exams. Findings consistent with congestive heart failure with pulmonary edema. Bilateral pneumonia could also present this fashion. No significant interim change from prior exam. IMPRESSION: Findings consistent with congestive heart failure with bilateral pulmonary edema bilateral pleural effusions. No significant interim change from prior exam. Electronically Signed   By: Maisie Fus  Register   On: 08/21/2017 10:06   Ir Paracentesis  Result Date: 08/20/2017 INDICATION: Patient with intra-abdominal abscess, cirrhosis, loculated ascites. Request made for diagnostic and therapeutic paracentesis. EXAM: ULTRASOUND GUIDED DIAGNOSTIC AND THERAPEUTIC PARACENTESIS MEDICATIONS: None COMPLICATIONS: None immediate. PROCEDURE: Informed written consent was obtained from the patient after a discussion of the risks, benefits and alternatives to treatment. A timeout was performed prior to the initiation of the procedure. Initial ultrasound  scanning demonstrates a moderate amount of loculated ascites within the left lower abdominal quadrant. The left lower abdomen was prepped and draped in the usual sterile fashion. 1% lidocaine was used for local anesthesia. Following this, a 19 gauge, 7-cm, Yueh catheter was introduced. An ultrasound image was saved for documentation purposes. The paracentesis was performed. The catheter was removed and a dressing was applied. The patient tolerated the procedure well without immediate post procedural complication. FINDINGS: A total of approximately 250 cc of turbid, yellow fluid was removed. Samples were sent to the laboratory as requested by the clinical team. Due to significant loculated nature of ascites only the above amount of fluid could be removed today. IMPRESSION: Successful ultrasound-guided diagnostic/therapeutic paracentesis yielding 250 cc of peritoneal fluid. Read by: Jeananne Rama, PA-C Electronically Signed   By: Simonne Come M.D.   On: 08/20/2017 12:44    Labs:  CBC: Recent Labs    08/18/17 0546 08/20/17 0326 08/21/17 0415 08/22/17 0544  WBC 19.9* 14.9* 14.8* 20.1*  HGB 10.2* 11.2* 11.5* 13.3  HCT 31.8* 35.5* 35.2* 41.4  PLT 229 192 199 261    COAGS: Recent Labs    08/15/17 1938  INR 1.65    BMP: Recent Labs    08/18/17 0546 08/20/17 0326 08/21/17 0415 08/22/17 0544  NA 142 139 135 134*  K 3.8 3.8 3.4* 4.3  CL 107 103 97* 91*  CO2 34*  GLUCOSE 95 92 100* 98  BUN 43* 12 5* 5*  CALCIUM 9.4 8.7* 8.1* 8.5*  CREATININE 0.80 0.39* 0.39* 0.54  GFRNONAA >60 >60 >60 >60  GFRAA >60 >60 >60 >60    LIVER FUNCTION TESTS: Recent Labs    08/16/17 0452 08/17/17 0422 08/21/17 0415 08/22/17 0544  BILITOT 1.8* 1.6* 1.4* 1.3*  AST 32 ALT 12* 11* 9* 9*  ALKPHOS 150* 120 136* 141*  PROT 5.9* 5.8* 5.0* 6.1*  ALBUMIN 1.9* 2.4* 2.0* 2.2*    Assessment and Plan:  LLQ abscess. S/p percutaneous abscess drain placement 08/15/2017 by Dr.  Archer Asa. Output clear yellow. Continue qshift flushing. IR to follow.  Electronically Signed: Elwin Mocha, PA-C 08/22/2017, 12:47 PM   I spent a total of 15 Minutes at the the patient's bedside AND on the patient's hospital floor or unit, greater than 50% of which was counseling/coordinating care for LLQ abscess.

## 2017-08-22 NOTE — Plan of Care (Signed)
Patient is making progress toward discharge goal 

## 2017-08-22 NOTE — Progress Notes (Signed)
Patient ID: Rachel Williams, female   DOB: [birth year], 67 y.o.   MRN: [id number]       Subjective: Patient feels ok today.  Did not like ensure, made him nauseated.  Eating some, but not much.  Pain is much better than on admission.  Objective: Vital signs in last 24 hours: Temp:  [98.4 F (36.9 C)-99.4 F (37.4 C)] 98.6 F (37 C) (05/17 0731) Pulse Rate:  [89-103] 101 (05/17 0731) Resp:  [13-25] 20 (05/17 0731) BP: (93-110)/(58-71) 109/71 (05/17 0731) SpO2:  [88 %-98 %] 94 % (05/17 0731) Last BM Date: 08/21/17  Intake/Output from previous day: 05/16 0701 - 05/17 0700 In: 1445 [P.O.:340; I.V.:790; IV Piggyback:300] Out: 2725 [Urine:2700; Drains:25] Intake/Output this shift: Total I/O In: 309.5 [P.O.:120; I.V.:39.5; IV Piggyback:150] Out: -   PE: Abd: seems more distended today, but minimally tender.  Drain with minimal serous output. +BS  Lab Results:  Recent Labs    08/21/17 0415 08/22/17 0544  WBC 14.8* 20.1*  HGB 11.5* 13.3  HCT 35.2* 41.4  PLT 199 261   BMET Recent Labs    08/21/17 0415 08/22/17 0544  NA 135 134*  K 3.4* 4.3  CL 97* 91*  CO2 29 34*  GLUCOSE 100* 98  BUN 5* 5*  CREATININE 0.39* 0.54  CALCIUM 8.1* 8.5*   PT/INR No results for input(s): LABPROT, INR in the last 72 hours. CMP     Component Value Date/Time   NA 134 (L) 08/22/2017 0544   K 4.3 08/22/2017 0544   CL 91 (L) 08/22/2017 0544   CO2 34 (H) 08/22/2017 0544   GLUCOSE 98 08/22/2017 0544   BUN 5 (L) 08/22/2017 0544   CREATININE 0.54 08/22/2017 0544   CALCIUM 8.5 (L) 08/22/2017 0544   PROT 6.1 (L) 08/22/2017 0544   ALBUMIN 2.2 (L) 08/22/2017 0544   AST 30 08/22/2017 0544   ALT 9 (L) 08/22/2017 0544   ALKPHOS 141 (H) 08/22/2017 0544   BILITOT 1.3 (H) 08/22/2017 0544   GFRNONAA >60 08/22/2017 0544   GFRAA >60 08/22/2017 0544   Lipase     Component Value Date/Time   LIPASE 18 08/15/2017 1002       Studies/Results: Dg Chest Port 1 View  Result Date:  08/21/2017 CLINICAL DATA:  Shortness of breath cough.  Congestion. EXAM: PORTABLE CHEST 1 VIEW COMPARISON:  08/18/2017. FINDINGS: Cardiomegaly with bilateral pulmonary infiltrates/edema and bilateral pleural effusions. Similar findings noted on prior exams. Findings consistent with congestive heart failure with pulmonary edema. Bilateral pneumonia could also present this fashion. No significant interim change from prior exam. IMPRESSION: Findings consistent with congestive heart failure with bilateral pulmonary edema bilateral pleural effusions. No significant interim change from prior exam. Electronically Signed   By: Maisie Fus  Register   On: 08/21/2017 10:06   Ir Paracentesis  Result Date: 08/20/2017 INDICATION: Patient with intra-abdominal abscess, cirrhosis, loculated ascites. Request made for diagnostic and therapeutic paracentesis. EXAM: ULTRASOUND GUIDED DIAGNOSTIC AND THERAPEUTIC PARACENTESIS MEDICATIONS: None COMPLICATIONS: None immediate. PROCEDURE: Informed written consent was obtained from the patient after a discussion of the risks, benefits and alternatives to treatment. A timeout was performed prior to the initiation of the procedure. Initial ultrasound scanning demonstrates a moderate amount of loculated ascites within the left lower abdominal quadrant. The left lower abdomen was prepped and draped in the usual sterile fashion. 1% lidocaine was used for local anesthesia. Following this, a 19 gauge, 7-cm, Yueh catheter was introduced. An ultrasound image was saved for documentation purposes. The paracentesis  was performed. The catheter was removed and a dressing was applied. The patient tolerated the procedure well without immediate post procedural complication. FINDINGS: A total of approximately 250 cc of turbid, yellow fluid was removed. Samples were sent to the laboratory as requested by the clinical team. Due to significant loculated nature of ascites only the above amount of fluid could be  removed today. IMPRESSION: Successful ultrasound-guided diagnostic/therapeutic paracentesis yielding 250 cc of peritoneal fluid. Read by: Jeananne Rama, PA-C Electronically Signed   By: Simonne Come M.D.   On: 08/20/2017 12:44    Anti-infectives: Anti-infectives (From admission, onward)   Start     Dose/Rate Route Frequency Ordered Stop   08/19/17 0945  vancomycin (VANCOCIN) IVPB 750 mg/150 ml premix     750 mg 150 mL/hr over 60 Minutes Intravenous Every 12 hours 08/19/17 0942     08/17/17 2030  meropenem (MERREM) 500 mg in sodium chloride 0.9 % 100 mL IVPB  Status:  Discontinued     500 mg 200 mL/hr over 30 Minutes Intravenous Every 12 hours 08/17/17 1429 08/19/17 0740   08/17/17 0900  vancomycin (VANCOCIN) 500 mg in sodium chloride 0.9 % 100 mL IVPB  Status:  Discontinued     500 mg 100 mL/hr over 60 Minutes Intravenous Every 24 hours 08/16/17 0816 08/19/17 0941   08/16/17 1200  cefTRIAXone (ROCEPHIN) 1 g in sodium chloride 0.9 % 100 mL IVPB  Status:  Discontinued     1 g 200 mL/hr over 30 Minutes Intravenous Every 24 hours 08/15/17 1351 08/16/17 0756   08/16/17 0830  vancomycin (VANCOCIN) IVPB 1000 mg/200 mL premix     1,000 mg 200 mL/hr over 60 Minutes Intravenous  Once 08/16/17 0816 08/16/17 1117   08/16/17 0830  meropenem (MERREM) 1 g in sodium chloride 0.9 % 100 mL IVPB  Status:  Discontinued     1 g 200 mL/hr over 30 Minutes Intravenous Every 12 hours 08/16/17 0816 08/17/17 1429   08/15/17 2200  metroNIDAZOLE (FLAGYL) IVPB 500 mg  Status:  Discontinued     500 mg 100 mL/hr over 60 Minutes Intravenous Every 8 hours 08/15/17 1351 08/16/17 0757   08/15/17 1115  cefTRIAXone (ROCEPHIN) 2 g in sodium chloride 0.9 % 100 mL IVPB     2 g 200 mL/hr over 30 Minutes Intravenous  Once 08/15/17 1113 08/15/17 1221   08/15/17 1115  metroNIDAZOLE (FLAGYL) IVPB 500 mg     500 mg 100 mL/hr over 60 Minutes Intravenous  Once 08/15/17 1113 08/15/17 1317       Assessment/Plan Septic shock  secondary to Intra-abdominal abscess(resolved sepsis) -tolerating soft diet, but not eating much.  Will DC ensure and change to resource breeze -para only with 250cc removed secondary to loculations.  Cell count still shows elevated WBC.  Await cx to see if MRSA has cleared, NGTD.  Suspect distention today may be secondary to more ascites. -cont vanc and merrem -drain per IR, output 25cc yesterday.  Stable for removal per IR. -WBC is up to 20K today  Cirrhosis -ascites, loculated.  Suspect increase in distention may be secondary to her ascites given her most recent scan showed an increase in ascitic fluid. Deconditioning  -cont PT.  Patient needs to mobilize more.  She has only been OOB once since being here.    Diastolic CHF/pulmonary HTN/tricuspid regurg Hypokalemia- 4.3 today COPD, tobacco abuse H/o ETOH abuse, still drinks 2 glasses of wine daily H/o compression fractures Malnutrition  FEN -soft diet, resource breeze VTE -  Lovenox /daily ID -merrem/vanc    LOS: 7 days    Letha Cape , [facility] 08/22/2017, 10:39 AM Pager: [phone]

## 2017-08-22 NOTE — Progress Notes (Signed)
Patient able to ambulate in her room from bed to the door using walker. She is very weak. During ambulation sat O2 dropped to 87 on RA. With 2L oxygen via [place], Sat O2 was 95% when ambulating. Will continue to monitor.

## 2017-08-22 NOTE — Progress Notes (Signed)
Patient refused to walk. She verbalized that is feeling weak and she will try later. Will continue to monitor.

## 2017-08-23 LAB — CBC
HEMATOCRIT: 35.8 % — AB (ref 36.0–46.0)
HEMOGLOBIN: 11.1 g/dL — AB (ref 12.0–15.0)
MCH: 26.6 pg (ref 26.0–34.0)
MCHC: 31 g/dL (ref 30.0–36.0)
MCV: 85.6 fL (ref 78.0–100.0)
Platelets: 300 10*3/uL (ref 150–400)
RBC: 4.18 MIL/uL (ref 3.87–5.11)
RDW: 15.2 % (ref 11.5–15.5)
WBC: 20.5 10*3/uL — AB (ref 4.0–10.5)

## 2017-08-23 LAB — COMPREHENSIVE METABOLIC PANEL
ALBUMIN: 2 g/dL — AB (ref 3.5–5.0)
ALK PHOS: 120 U/L (ref 38–126)
ALT: 9 U/L — AB (ref 14–54)
ANION GAP: 8 (ref 5–15)
AST: 27 U/L (ref 15–41)
BILIRUBIN TOTAL: 1.2 mg/dL (ref 0.3–1.2)
BUN: 9 mg/dL (ref 6–20)
CALCIUM: 8.2 mg/dL — AB (ref 8.9–10.3)
CO2: 33 mmol/L — AB (ref 22–32)
CREATININE: 0.82 mg/dL (ref 0.44–1.00)
Chloride: 92 mmol/L — ABNORMAL LOW (ref 101–111)
GFR calc Af Amer: >60 mL/min (ref >60)
GFR calc non Af Amer: >60 mL/min (ref >60)
GLUCOSE: 83 mg/dL (ref 65–99)
Potassium: 4.4 mmol/L (ref 3.5–5.1)
Sodium: 133 mmol/L — ABNORMAL LOW (ref 135–145)
TOTAL PROTEIN: 5.7 g/dL — AB (ref 6.5–8.1)

## 2017-08-23 LAB — MAGNESIUM: Magnesium: 1.7 mg/dL (ref 1.7–2.4)

## 2017-08-23 MED ORDER — MAGNESIUM OXIDE 400 (241.3 MG) MG PO TABS
800.0000 mg | ORAL_TABLET | Freq: Once | ORAL | Status: AC
  Administered 2017-08-23: 800 mg via ORAL
  Filled 2017-08-23: qty 2

## 2017-08-23 NOTE — Progress Notes (Signed)
Referring Physician(s): Gross,S  Supervising Physician: Oley Balm  Patient Status:  [facility] - In-pt  Chief Complaint:  Abdominal pain/abscess  Subjective: Pt "tired of being here"; still has some abd discomfort; denies N/V   Allergies: Ciprofloxacin; Levofloxacin; Nickel; and Penicillins  Medications: Prior to Admission medications   Medication Sig Start Date End Date Taking? Authorizing Provider  acetaminophen (TYLENOL) 500 MG tablet Take 1,000 mg by mouth as needed for mild pain.   Yes [provider]  folic acid (FOLVITE) 1 MG tablet Take 1 tablet (1 mg total) by mouth daily. 06/26/17  Yes Burnadette Pop, MD  furosemide (LASIX) 40 MG tablet Take 1 tablet (40 mg total) by mouth daily. 06/27/17  Yes Burnadette Pop, MD  naproxen sodium (ALEVE) 220 MG tablet Take 440 mg by mouth daily as needed (pain).   Yes [provider]  Potassium Chloride ER 20 MEQ TBCR Take 20 mEq by mouth daily. 06/26/17  Yes Burnadette Pop, MD  spironolactone (ALDACTONE) 25 MG tablet Take 0.5 tablets (12.5 mg total) by mouth daily. 07/30/17 10/28/17 Yes Bensimhon, Bevelyn Buckles, MD  thiamine 100 MG tablet Take 1 tablet (100 mg total) by mouth daily. Patient not taking: Reported on 07/30/2017 06/26/17   Burnadette Pop, MD     Vital Signs: BP 122/70 (BP Location: Right Arm)   Pulse (!) 101   Temp 98.1 F (36.7 C)   Resp 14   Ht  (1.575 m)   Wt 103 lb (46.7 kg)   SpO2 100%   BMI 18.84 kg/m   Physical Exam left abd drain intact, output 20 cc hazy, yellow fluid; insertion site ok, mildly tender  Imaging: Ct Abdomen Pelvis W Contrast  Result Date: 08/20/2017 CLINICAL DATA:  Abdominal pain, fever.  Follow-up abscess EXAM: CT ABDOMEN AND PELVIS WITH CONTRAST TECHNIQUE: Multidetector CT imaging of the abdomen and pelvis was performed using the standard protocol following bolus administration of intravenous contrast. CONTRAST:  100 mL ISOVUE-300 IOPAMIDOL (ISOVUE-300) INJECTION 61%  COMPARISON:  08/15/2017 FINDINGS: Lower chest: New small bilateral pleural effusions with compressive atelectasis in the lower lobes. Heart is mildly enlarged. Hepatobiliary: Subtle nodularity to the liver surface. Enlargement of the caudate lobe. Diffuse hypodensity throughout the liver. Findings likely reflect cirrhosis. No focal hepatic abnormality. Gallbladder unremarkable. There appears to be pneumobilia within the distal common bile duct. Pancreas: No focal abnormality or ductal dilatation. Spleen: No focal abnormality.  Normal size. Adrenals/Urinary Tract: No adrenal abnormality. No focal renal abnormality. No stones or hydronephrosis. Urinary bladder is unremarkable. Stomach/Bowel: No evidence of bowel obstruction. Postoperative changes in the sigmoid colon. Distal gastric wall again appears to be edematous, similar to prior study. Vascular/Lymphatic: Aortic atherosclerosis. No enlarged abdominal or pelvic lymph nodes. Reproductive: IUD noted within the uterus.  No adnexal mass. Other: Moderate to large volume ascites noted throughout the abdomen and pelvis which appears loculated throughout the abdomen and pelvis. This is increased significantly since prior study. Previously seen left abdominal abscess has decreased significantly in size with pigtail drainage catheter in place. The area currently measures 6.1 x 1.7 cm compared to approximately 9.1 x 4.9 cm previously. Musculoskeletal: No acute bony abnormality. Chronic compression fractures in the lower thoracic and upper lumbar spine. Diffuse degenerative disc and facet disease. IMPRESSION: Significant improvement in the left lateral abdominal abscess with pigtail drainage catheter in place, measurements as above. Only a small amount of drainable fluid remains. Significant increase in loculated moderate to large volume ascites throughout the abdomen and pelvis.  Suspect changes of cirrhosis with heterogeneous low-density throughout the liver and mild  nodular contours. New small bilateral pleural Charlett Nose, compressive atelectasis in the lower lobes. Electronically Signed   By: Kevin  Dover M.D.   On: 08/20/2017 10:40   Dg Chest Port 1 View  Result Date: 08/21/2017 CLINICAL DATA:  Shortness of breath cough.  Congestion. EXAM: PORTABLE CHEST 1 VIEW COMPARISON:  08/18/2017. FINDINGS: Cardiomegaly with bilateral pulmonary infiltrates/edema and bilateral pleural effusions. Similar findings noted on prior exams. Findings consistent with congestive heart failure with pulmonary edema. Bilateral pneumonia could also present this fashion. No significant interim change from prior exam. IMPRESSION: Findings consistent with congestive heart failure with bilateral pulmonary edema bilateral pleural effusions. No significant interim change from prior exam. Electronically Signed   By: Maisie Fus  Register   On: 08/21/2017 10:06   Ir Paracentesis  Result Date: 08/20/2017 INDICATION: Patient with intra-abdominal abscess, cirrhosis, loculated ascites. Request made for diagnostic and therapeutic paracentesis. EXAM: ULTRASOUND GUIDED DIAGNOSTIC AND THERAPEUTIC PARACENTESIS MEDICATIONS: None COMPLICATIONS: None immediate. PROCEDURE: Informed written consent was obtained from the patient after a discussion of the risks, benefits and alternatives to treatment. A timeout was performed prior to the initiation of the procedure. Initial ultrasound scanning demonstrates a moderate amount of loculated ascites within the left lower abdominal quadrant. The left lower abdomen was prepped and draped in the usual sterile fashion. 1% lidocaine was used for local anesthesia. Following this, a 19 gauge, 7-cm, Yueh catheter was introduced. An ultrasound image was saved for documentation purposes. The paracentesis was performed. The catheter was removed and a dressing was applied. The patient tolerated the procedure well without immediate post procedural complication. FINDINGS: A total of  approximately 250 cc of turbid, yellow fluid was removed. Samples were sent to the laboratory as requested by the clinical team. Due to significant loculated nature of ascites only the above amount of fluid could be removed today. IMPRESSION: Successful ultrasound-guided diagnostic/therapeutic paracentesis yielding 250 cc of peritoneal fluid. Read by: Jeananne Rama, PA-C Electronically Signed   By: Simonne Come M.D.   On: 08/20/2017 12:44    Labs:  CBC: Recent Labs    08/20/17 0326 08/21/17 0415 08/22/17 0544 08/23/17 0500  WBC 14.9* 14.8* 20.1* 20.5*  HGB 11.2* 11.5* 13.3 11.1*  HCT 35.5* 35.2* 41.4 35.8*  PLT 192 199 261 300    COAGS: Recent Labs    08/15/17 1938  INR 1.65    BMP: Recent Labs    08/20/17 0326 08/21/17 0415 08/22/17 0544 08/23/17 0500  NA 139 135 134* 133*  K 3.8 3.4* 4.3 4.4  CL 103 97* 91* 92*  CO2 25 29 34* 33*  GLUCOSE 92 100* 98 83  BUN 12 5* 5* 9  CALCIUM 8.7* 8.1* 8.5* 8.2*  CREATININE 0.39* 0.39* 0.54 0.82  GFRNONAA >60 >60 >60 >60  GFRAA >60 >60 >60 >60    LIVER FUNCTION TESTS: Recent Labs    08/17/17 0422 08/21/17 0415 08/22/17 0544 08/23/17 0500  BILITOT 1.6* 1.4* 1.3* 1.2  AST ALT 11* 9* 9* 9*  ALKPHOS 120 136* 141* 120  PROT 5.8* 5.0* 6.1* 5.7*  ALBUMIN 2.4* 2.0* 2.2* 2.0*    Assessment and Plan: Pt with hx colonic perf/colon resection, LLQ abscess; s/p left abd drain 5/10; afebrile; WBC 20.5(20.1), HGB 11.1, creat nl; perit fluid cx- MRSA; per rec of CCS, LLQ drain removed in its entirety without immediate complications; gauze dressing applied to site   Electronically Signed: D.  Jeananne Rama, PA-C 08/23/2017, 2:30 PM   I spent a total of 15 minutes at the the patient's bedside AND on the patient's hospital floor or unit, greater than 50% of which was counseling/coordinating care for left abdominal abscess drain    Patient ID: Kalman Jewels, female   DOB: 1951-02-01, 67 y.o.   MRN: [id number]

## 2017-08-23 NOTE — Progress Notes (Signed)
Patient ID: Rachel Williams, female   DOB: [birth year], 67 y.o.   MRN: [id number]       Subjective: No complaints.   Objective: Vital signs in last 24 hours: Temp:  [97.6 F (36.4 C)-98.6 F (37 C)] 98.5 F (36.9 C) (05/18 0631) Pulse Rate:  [90-102] 95 (05/18 0928) Resp:  [10-20] 20 (05/18 0928) BP: (90-109)/(52-67) 101/64 (05/18 0928) SpO2:  [93 %-98 %] 95 % (05/18 0928) Last BM Date: 08/21/17  Intake/Output from previous day: 05/17 0701 - 05/18 0700 In: 1351.5 [P.O.:440; I.V.:551.5; IV Piggyback:350] Out: 20 [Drains:20] Intake/Output this shift: Total I/O In: 623 [P.O.:240; I.V.:233; IV Piggyback:150] Out: -   PE: Abd: distended but minimally tender.  Drain with minimal serous output. +BS  Lab Results:  Recent Labs    08/22/17 0544 08/23/17 0500  WBC 20.1* 20.5*  HGB 13.3 11.1*  HCT 41.4 35.8*  PLT 261 300   BMET Recent Labs    08/22/17 0544 08/23/17 0500  NA 134* 133*  K 4.3 4.4  CL 91* 92*  CO2 34* 33*  GLUCOSE 98 83  BUN 5* 9  CREATININE 0.54 0.82  CALCIUM 8.5* 8.2*   PT/INR No results for input(s): LABPROT, INR in the last 72 hours. CMP     Component Value Date/Time   NA 133 (L) 08/23/2017 0500   K 4.4 08/23/2017 0500   CL 92 (L) 08/23/2017 0500   CO2 33 (H) 08/23/2017 0500   GLUCOSE 83 08/23/2017 0500   BUN 9 08/23/2017 0500   CREATININE 0.82 08/23/2017 0500   CALCIUM 8.2 (L) 08/23/2017 0500   PROT 5.7 (L) 08/23/2017 0500   ALBUMIN 2.0 (L) 08/23/2017 0500   AST 27 08/23/2017 0500   ALT 9 (L) 08/23/2017 0500   ALKPHOS 120 08/23/2017 0500   BILITOT 1.2 08/23/2017 0500   GFRNONAA >60 08/23/2017 0500   GFRAA >60 08/23/2017 0500   Lipase     Component Value Date/Time   LIPASE 18 08/15/2017 1002       Studies/Results: No results found.  Anti-infectives: Anti-infectives (From admission, onward)   Start     Dose/Rate Route Frequency Ordered Stop   08/19/17 0945  vancomycin (VANCOCIN) IVPB 750 mg/150 ml premix     750 mg 150  mL/hr over 60 Minutes Intravenous Every 12 hours 08/19/17 0942     08/17/17 2030  meropenem (MERREM) 500 mg in sodium chloride 0.9 % 100 mL IVPB  Status:  Discontinued     500 mg 200 mL/hr over 30 Minutes Intravenous Every 12 hours 08/17/17 1429 08/19/17 0740   08/17/17 0900  vancomycin (VANCOCIN) 500 mg in sodium chloride 0.9 % 100 mL IVPB  Status:  Discontinued     500 mg 100 mL/hr over 60 Minutes Intravenous Every 24 hours 08/16/17 0816 08/19/17 0941   08/16/17 1200  cefTRIAXone (ROCEPHIN) 1 g in sodium chloride 0.9 % 100 mL IVPB  Status:  Discontinued     1 g 200 mL/hr over 30 Minutes Intravenous Every 24 hours 08/15/17 1351 08/16/17 0756   08/16/17 0830  vancomycin (VANCOCIN) IVPB 1000 mg/200 mL premix     1,000 mg 200 mL/hr over 60 Minutes Intravenous  Once 08/16/17 0816 08/16/17 1117   08/16/17 0830  meropenem (MERREM) 1 g in sodium chloride 0.9 % 100 mL IVPB  Status:  Discontinued     1 g 200 mL/hr over 30 Minutes Intravenous Every 12 hours 08/16/17 0816 08/17/17 1429   08/15/17 2200  metroNIDAZOLE (FLAGYL) IVPB 500  mg  Status:  Discontinued     500 mg 100 mL/hr over 60 Minutes Intravenous Every 8 hours 08/15/17 1351 08/16/17 0757   08/15/17 1115  cefTRIAXone (ROCEPHIN) 2 g in sodium chloride 0.9 % 100 mL IVPB     2 g 200 mL/hr over 30 Minutes Intravenous  Once 08/15/17 1113 08/15/17 1221   08/15/17 1115  metroNIDAZOLE (FLAGYL) IVPB 500 mg     500 mg 100 mL/hr over 60 Minutes Intravenous  Once 08/15/17 1113 08/15/17 1317       Assessment/Plan Septic shock secondary to Intra-abdominal abscess(resolved sepsis) -tolerating soft diet, breeze -para only with 250cc removed secondary to loculations.  Cell count still shows elevated WBC.  Await cx to see if MRSA has cleared, NGTD.  Suspect distention today may be secondary to more ascites. -cont vanc and merrem -drain per IR, output 25cc yesterday.  Stable for removal per IR. -WBC is stable 20K today  Cirrhosis -ascites,  loculated.  Suspect increase in distention may be secondary to her ascites given her most recent scan showed an increase in ascitic fluid. Deconditioning  -cont PT.  Patient needs to mobilize more.   Diastolic CHF/pulmonary HTN/tricuspid regurg Hypokalemia- 4.4 today COPD, tobacco abuse H/o ETOH abuse, still drinks 2 glasses of wine daily H/o compression fractures Malnutrition  FEN -soft diet, resource breeze VTE -Lovenox /daily ID -merrem/vanc  Will see again Monday please call if any concerns   LOS: 8 days    Berna Bue , MD [facility] Surgery 08/23/2017, 10:52 AM

## 2017-08-23 NOTE — Progress Notes (Signed)
PROGRESS NOTE    Rachel Williams  UJW:[id number] DOB: March 15, 1951 DOA: 08/15/2017 PCP: Sigmund Hazel, MD   Brief Narrative:  67 year old with a history of chronic diastolic CHF, pulmonary hypertension, active smoker, alcohol abuse, history of bowel perforation status post colectomy, moderate to severe tricuspid regurgitation presented to the hospital with complaints of abdominal pain.  Patient was found to be in septic shock secondary to intra-abdominal abscess versus possible perforated diverticulitis.  CT of the abdomen pelvis showed moderate ascites with 9 cm irregular cystic mass in the left paracolic gutter concerning for abscess.  General surgery was consulted who recommended getting IR guided drain placement which was placed on 5/10.  Due to ascites underwent paracentesis on 5/10 as well.  Cultures ended up growing staph aureus from the drain as well as an narrow therefore patient was started on IV Rocephin and Flagyl and later transitioned to IV meropenem.  Vancomycin was also added.  Case was discussed with Dr. Ninetta Lights from infectious disease who recommended continuing current antibiotics and eventually transitioning to oral Bactrim.  General surgery has been following the patient.   Assessment & Plan:   Principal Problem:   Intra-abdominal abscess s/p perc drainage 08/15/2017 Active Problems:   Hypokalemia   Smoker   Severe sepsis (HCC)   Chronic diastolic CHF (congestive heart failure) (HCC)   Acute respiratory failure (HCC)   Pleural effusion   Hepatic cirrhosis (HCC)   Septic shock secondary to intra-abdominal abscess, continues to improve - Patient is currently only on vancomycin per recommendations by infectious disease.  White count is slightly trended up, if it continues to rise I will add meropenem.  CT of the abdomen pelvis from 5/15 shows improvement in left lateral wall abscess with some loculated ascites.  Only 250 cc is aspirated on 5/15. General surgery and interventional  radiology are following.  Acute on chronic diastolic CHF Severe pulmonary hypertension Moderate to severe tricuspid regurgitation and bilateral pleural effusions - She has some signs of volume overload and chest x-ray suggestive of pulmonary edema therefore received 2 days of IV Lasix 80 mg yesterday.  In spite of patient getting multiple doses of 80 mg IV Lasix she is +1.3 L with no urine output recorded.  Urine output needs to be documented appropriately.  Slightly soft blood pressure with systolic in 80s.  Will closely monitor this.  She is currently breathing well on 2 L nasal cannula. 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  Needs to quit drinking alcohol and follow-up with outpatient gastroenterology.  Provide supportive care  Tobacco use -Nicotine patch as needed  Physical therapy 4/14 recommended skilled nursing facility  DVT prophylaxis: Subcutaneous heparin Code Status: Full code Family Communication: None at bedside but updated patient's daughter over the phone Disposition Plan: We will plan of staying in the hospital for another 2-3 days.  Consultants:   Interventional radiology  Surgery   Subjective: No complaints this morning besides slight abdominal discomfort at the drain site insertion..  Feels little better.  Review of Systems Otherwise negative except as per HPI, including: HEENT/EYES = negative for pain, redness, loss of vision, double vision, blurred vision, loss of hearing, sore throat, hoarseness, dysphagia Cardiovascular= negative for chest pain, palpitation, murmurs, lower extremity swelling Respiratory/lungs= negative for shortness of breath, cough, hemoptysis, wheezing, mucus production Gastrointestinal= negative for nausea, vomiting,melena,  hematemesis Genitourinary= negative for Dysuria, Hematuria, Change in Urinary Frequency MSK = Negative for arthralgia, myalgias, Back Pain, Joint swelling  Neurology= Negative for headache, seizures, numbness, tingling  Psychiatry= Negative for anxiety, depression, suicidal and homocidal ideation Allergy/Immunology= Medication/Food allergy as listed  Skin= Negative for Rash, lesions, ulcers, itching     Objective: Vitals:   08/22/17 2325 08/23/17 0620 08/23/17 0631 08/23/17 0928  BP: 109/67 (!) 104/52  101/64  Pulse: 93 (!) 102  95  Resp: Temp:  98.2 F (36.8 C) 98.5 F (36.9 C)   TempSrc:  Oral Oral   SpO2: 98% 93%  95%  Weight:      Height:        Intake/Output Summary (Last 24 hours) at 08/23/2017 1023 Last data filed at 08/23/2017 0960 Gross per 24 hour  Intake 1042 ml  Output 20 ml  Net 1022 ml   Filed Weights   08/15/17 1009 08/15/17 1846  Weight: 46.7 kg (103 lb) 46.7 kg (103 lb)    Examination: Constitutional: NAD, calm, comfortable, elderly frail-appearing Eyes: PERRL, lids and conjunctivae normal ENMT: Mucous membranes are moist. Posterior pharynx clear of any exudate or lesions.Normal dentition.  Neck: normal, supple, no masses, no thyromegaly Respiratory: Slight bibasilar crackles Cardiovascular: Regular rate and rhythm, no murmurs / rubs / gallops. No extremity edema. 2+ pedal pulses. No carotid bruits.  Abdomen: no tenderness, no masses palpated. No hepatosplenomegaly. Bowel sounds positive.  Abdominal percutaneous drain noted in the left lower quadrant without any signs of active bleeding or infection. Musculoskeletal: no clubbing / cyanosis. No joint deformity upper and lower extremities. Good ROM, no contractures. Normal muscle tone.  Skin: no rashes, lesions, ulcers. No induration Neurologic: CN 2-12 grossly intact. Sensation intact, DTR normal. Strength 5/5 in all 4.  Psychiatric: Normal judgment and insight. Alert and oriented x 3. Normal  mood.   Data Reviewed:   CBC: Recent Labs  Lab 08/18/17 0546 08/20/17 0326 08/21/17 0415 08/22/17 0544 08/23/17 0500  WBC 19.9* 14.9* 14.8* 20.1* 20.5*  HGB 10.2* 11.2* 11.5* 13.3 11.1*  HCT 31.8* 35.5* 35.2* 41.4 35.8*  MCV 82.4 84.5 82.4 83.3 85.6  PLT 229 192 199 261 300   Basic Metabolic Panel: Recent Labs  Lab 08/16/17 1554  08/17/17 0422 08/18/17 0546 08/20/17 0326 08/21/17 0415 08/22/17 0544 08/23/17 0500  NA  --    < > 139 142 139 135 134* 133*  K  --    < > 3.8 3.8 3.8 3.4* 4.3 4.4  CL  --    < > 103 107 103 97* 91* 92*  CO2  --    < > 34* 33*  GLUCOSE  --    < > 64* 95 92 100* 98 83  BUN  --    < > 55* 43* 12 5* 5* 9  CREATININE  --    < > 1.62* 0.80 0.39* 0.39* 0.54 0.82  CALCIUM  --    < > 9.1 9.4 8.7* 8.1* 8.5* 8.2*  MG  --   --  1.9  --  1.0* 0.9* 1.5* 1.7  PHOS 3.8  --   --   --   --   --   --   --    < > = values in this interval not displayed.   GFR: Estimated Creatinine Clearance: 47.7 mL/min (by C-G formula based on SCr of 0.82 mg/dL). Liver Function Tests: Recent Labs  Lab 08/17/17 0422  08/21/17 0415 08/22/17 0544 08/23/17 0500  AST ALT 11* 9* 9* 9*  ALKPHOS 120 136* 141* 120  BILITOT 1.6* 1.4* 1.3* 1.2  PROT 5.8* 5.0* 6.1* 5.7*  ALBUMIN 2.4* 2.0* 2.2* 2.0*   No results for input(s): LIPASE, AMYLASE in the last 168 hours. No results for input(s): AMMONIA in the last 168 hours. Coagulation Profile: No results for input(s): INR, PROTIME in the last 168 hours. Cardiac Enzymes: No results for input(s): CKTOTAL, CKMB, CKMBINDEX, TROPONINI in the last 168 hours. BNP (last 3 results) No results for input(s): PROBNP in the last 8760 hours. HbA1C: No results for input(s): HGBA1C in the last 72 hours. CBG: Recent Labs  Lab 08/21/17 2009 08/22/17 0001 08/22/17 0344 08/22/17 0819 08/22/17 1158  GLUCAP 126* 100* 86 91 119*   Lipid Profile: No results for input(s): CHOL, HDL, LDLCALC, TRIG, CHOLHDL, LDLDIRECT  in the last 72 hours. Thyroid Function Tests: No results for input(s): TSH, T4TOTAL, FREET4, T3FREE, THYROIDAB in the last 72 hours. Anemia Panel: No results for input(s): VITAMINB12, FOLATE, FERRITIN, TIBC, IRON, RETICCTPCT in the last 72 hours. Sepsis Labs: Recent Labs  Lab 08/16/17 1546 08/16/17 1904  LATICACIDVEN 1.3 1.2    Recent Results (from the past 240 hour(s))  Blood Culture (routine x 2)     Status: None   Collection Time: 08/15/17 10:14 AM  Result Value Ref Range Status   Specimen Description BLOOD RIGHT ANTECUBITAL  Final   Special Requests   Final    BOTTLES DRAWN AEROBIC AND ANAEROBIC Blood Culture adequate volume   Culture   Final    NO GROWTH 5 DAYS Performed at [facility], [address] [place], [place] [address]    Report Status 08/20/2017 FINAL  Final  Blood Culture (routine x 2)     Status: None   Collection Time: 08/15/17 10:51 AM  Result Value Ref Range Status   Specimen Description BLOOD LEFT WRIST  Final   Special Requests   Final    BOTTLES DRAWN AEROBIC AND ANAEROBIC Blood Culture adequate volume   Culture   Final    NO GROWTH 5 DAYS Performed at [facility], [address] [place], [place] [address]    Report Status 08/20/2017 FINAL  Final  Aerobic/Anaerobic Culture (surgical/deep wound)     Status: None   Collection Time: 08/15/17  4:42 PM  Result Value Ref Range Status   Specimen Description ABSCESS  Final   Special Requests PARACENTESIS  Final   Gram Stain   Final    ABUNDANT WBC PRESENT, PREDOMINANTLY PMN ABUNDANT GRAM POSITIVE COCCI    Culture   Final    ABUNDANT METHICILLIN RESISTANT STAPHYLOCOCCUS AUREUS NO ANAEROBES ISOLATED Performed at [facility], [address], [place], [place] [address]    Report Status 08/21/2017 FINAL  Final   Organism ID, Bacteria METHICILLIN RESISTANT STAPHYLOCOCCUS AUREUS  Final      Susceptibility   Methicillin resistant staphylococcus aureus - MIC*    CIPROFLOXACIN >=8  RESISTANT Resistant     ERYTHROMYCIN >=8 RESISTANT Resistant     GENTAMICIN <=0.5 SENSITIVE Sensitive     OXACILLIN >=4 RESISTANT Resistant     TETRACYCLINE <=1 SENSITIVE Sensitive     VANCOMYCIN 1 SENSITIVE Sensitive     TRIMETH/SULFA <=10 SENSITIVE Sensitive     CLINDAMYCIN RESISTANT Resistant     RIFAMPIN <=0.5 SENSITIVE Sensitive     Inducible Clindamycin POSITIVE Resistant     *  ABUNDANT METHICILLIN RESISTANT STAPHYLOCOCCUS AUREUS  MRSA PCR Screening     Status: None   Collection Time: 08/16/17  4:58 AM  Result Value Ref Range Status   MRSA by PCR NEGATIVE NEGATIVE Final    Comment:        The GeneXpert MRSA Assay (FDA approved for NASAL specimens only), is one component of a comprehensive MRSA colonization surveillance program. It is not intended to diagnose MRSA infection nor to guide or monitor treatment for MRSA infections. Performed at [facility], [address], [place], [place] [address]   Culture, body fluid-bottle     Status: None (Preliminary result)   Collection Time: 08/20/17 12:41 PM  Result Value Ref Range Status   Specimen Description PERITONEAL  Final   Special Requests   Final    NONE Performed at [facility], [address] [place], [place] [address]    Gram Stain   Final    ANAEROBIC BOTTLE ONLY GRAM POSITIVE COCCI CRITICAL RESULT CALLED TO, READ BACK BY AND VERIFIED WITH: A JETER RN 08/23/17 0612 JDW    Culture GRAM POSITIVE COCCI  Final   Report Status PENDING  Incomplete  Gram stain     Status: None   Collection Time: 08/20/17 12:41 PM  Result Value Ref Range Status   Specimen Description PERITONEAL  Final   Special Requests NONE  Final   Gram Stain   Final    FEW WBC PRESENT, PREDOMINANTLY PMN NO ORGANISMS SEEN Performed at [facility], [address], [place], [place] [address]    Report Status 08/20/2017 FINAL  Final         Radiology Studies: No results found.      Scheduled Meds: . benzonatate  100  mg Oral BID  . enoxaparin (LOVENOX) injection  30 mg Subcutaneous Q24H  . feeding supplement  1 Container Oral TID BM  . guaiFENesin  600 mg Oral BID  . mupirocin ointment  1 application Nasal BID  . nicotine  14 mg Transdermal Daily  . sodium chloride flush  5 mL Intracatheter Q8H   Continuous Infusions: . dextrose 5 % and 0.45 % NaCl with KCl 20 mEq/L 1,000 mL (08/22/17 0820)  . methocarbamol (ROBAXIN)  IV    . vancomycin 750 mg (08/23/17 0955)     LOS: 8 days    I have spent 35 minutes face to face with the patient and on the ward discussing the patients care, assessment, plan and disposition with other care givers. >50% of the time was devoted counseling the patient about the risks and benefits of treatment and coordinating care.     Ankit Joline Maxcy, MD [facility] Pager [phone]   If 7PM-7AM, please contact night-coverage [email or web address] Password [facility] 08/23/2017, 10:23 AM

## 2017-08-23 NOTE — Progress Notes (Signed)
ANTIBIOTIC CONSULT NOTE - Follow-Up  Pharmacy Consult for Vancomycin  Indication: intra-abdominal infection  Labs: Recent Labs    08/21/17 0415 08/22/17 0544 08/23/17 0500  WBC 14.8* 20.1* 20.5*  HGB 11.5* 13.3 11.1*  PLT 199 261 300  CREATININE 0.39* 0.54 0.82   Estimated Creatinine Clearance: 47.7 mL/min (by C-G formula based on SCr of 0.82 mg/dL). Recent Labs    08/20/17 1942  VANCOTROUGH 15      Assessment: 67 yo female presented to ED on 4/10 with abdominal pain and hypotension. IR reported high likelihood of infected ascites c/w bacterial peritonitis and LLQ abscess with thick pus. Pharmacy consulted to start vancomycin. Abscess culture growing MRSA. Meropenem d/c'd  Vancomycin trough therapeutic 5/15  Ceftriaxone 5/10 > 5/11 Flagyl 5/10 > 5/11 Vancomycin 5/11 >> Meropenem 5/11 >>5/14  5/10 BCx >> ngtd 5/10 Abscess cx >> abundant MRSA  5/10 Ascites cx >> GPC  Goal of Therapy:  Vancomycin trough level 15-20 mcg/ml  Plan:  -Continue Vancomycin 750 mg every 12 hours -Continue to follow  Thank you Okey Regal, PharmD [phone]   08/23/2017 12:00 PM

## 2017-08-24 LAB — CBC
HEMATOCRIT: 36.3 % (ref 36.0–46.0)
HEMOGLOBIN: 11.4 g/dL — AB (ref 12.0–15.0)
MCH: 27.5 pg (ref 26.0–34.0)
MCHC: 31.4 g/dL (ref 30.0–36.0)
MCV: 87.5 fL (ref 78.0–100.0)
Platelets: 346 10*3/uL (ref 150–400)
RBC: 4.15 MIL/uL (ref 3.87–5.11)
RDW: 15.3 % (ref 11.5–15.5)
WBC: 23.1 10*3/uL — AB (ref 4.0–10.5)

## 2017-08-24 LAB — GLUCOSE, CAPILLARY
GLUCOSE-CAPILLARY: 95 mg/dL (ref 65–99)
Glucose-Capillary: 114 mg/dL — ABNORMAL HIGH (ref 65–99)

## 2017-08-24 LAB — MAGNESIUM: Magnesium: 1.6 mg/dL — ABNORMAL LOW (ref 1.7–2.4)

## 2017-08-24 MED ORDER — SODIUM CHLORIDE 0.9 % IV BOLUS
500.0000 mL | Freq: Once | INTRAVENOUS | Status: AC
  Administered 2017-08-24: 500 mL via INTRAVENOUS

## 2017-08-24 MED ORDER — SODIUM CHLORIDE 0.9 % IV SOLN
1.0000 g | Freq: Two times a day (BID) | INTRAVENOUS | Status: DC
  Administered 2017-08-24 – 2017-08-28 (×8): 1 g via INTRAVENOUS
  Filled 2017-08-24 (×9): qty 1

## 2017-08-24 NOTE — Progress Notes (Signed)
MD on call informed of patient's low BP. Advised to monitor same for the present time.

## 2017-08-24 NOTE — Progress Notes (Signed)
BP 78/47, MAP 47. MD on call paged.

## 2017-08-24 NOTE — Progress Notes (Signed)
ANTIBIOTIC CONSULT NOTE - Follow-Up  Pharmacy Consult for Vancomycin  Indication: intra-abdominal infection  Labs: Recent Labs    08/22/17 0544 08/23/17 0500 08/24/17 0447  WBC 20.1* 20.5* 23.1*  HGB 13.3 11.1* 11.4*  PLT 261 300 346  CREATININE 0.54 0.82  --    Estimated Creatinine Clearance: 47.7 mL/min (by C-G formula based on SCr of 0.82 mg/dL).  Assessment: 67 yo female presented to ED on 4/10 with abdominal pain and hypotension. IR reported high likelihood of infected ascites c/w bacterial peritonitis and LLQ abscess with thick pus. Pharmacy consulted to start vancomycin. Abscess culture growing MRSA. Meropenem d/c'd 5/14, and is being restarted today, 5/19.   Patient is afebrile, WBCs remain elevated, and SCr has increased but still WNL.   Ceftriaxone 5/10 > 5/11 Flagyl 5/10 > 5/11 Vancomycin 5/11 >> Meropenem 5/11 >>5/14, 5/19 >>  5/10 BCx >> ngtd 5/10 Abscess cx >> abundant MRSA  5/10 Ascites cx >> GPC  Goal of Therapy:  Vancomycin trough level 15-20 mcg/ml  Plan:  -Resume meropenem 1gm IV every 12 hours  -Continue Vancomycin 750 mg every 12 hours -Continue to follow  Carylon Perches, PharmD PGY2 Oncology Pharmacy Resident  Pharmacy Phone: [phone] 08/24/2017      08/24/2017 8:10 AM

## 2017-08-24 NOTE — Progress Notes (Signed)
PROGRESS NOTE    Rachel Williams  ZOX:[id number] DOB: 01-01-1951 DOA: 08/15/2017 PCP: Sigmund Hazel, MD   Brief Narrative:  67 year old with a history of chronic diastolic CHF, pulmonary hypertension, active smoker, alcohol abuse, history of bowel perforation status post colectomy, moderate to severe tricuspid regurgitation presented to the hospital with complaints of abdominal pain.  Patient was found to be in septic shock secondary to intra-abdominal abscess versus possible perforated diverticulitis.  CT of the abdomen pelvis showed moderate ascites with 9 cm irregular cystic mass in the left paracolic gutter concerning for abscess.  General surgery was consulted who recommended getting IR guided drain placement which was placed on 5/10.  Due to ascites underwent paracentesis on 5/10 as well.  Cultures ended up growing staph aureus from the drain as well as an narrow therefore patient was started on IV Rocephin and Flagyl and later transitioned to IV meropenem.  Vancomycin was also added.  Case was discussed with Dr. Ninetta Lights from infectious disease who recommended continuing current antibiotics and eventually transitioning to oral Bactrim.  General surgery has been following the patient.   Assessment & Plan:   Principal Problem:   Intra-abdominal abscess s/p perc drainage 08/15/2017 Active Problems:   Hypokalemia   Smoker   Severe sepsis (HCC)   Chronic diastolic CHF (congestive heart failure) (HCC)   Acute respiratory failure (HCC)   Pleural effusion   Hepatic cirrhosis (HCC)   Septic shock secondary to intra-abdominal abscess with hypotension this morning - Patient is currently on vancomycin, due to concerns of rising WBC, low-grade temperature overnight and hypotension this morning we will add meropenem back again.  Her drain was removed yesterday by interventional radiology.  General surgery is following.  Will closely monitor her, monitor urine output.  500 cc of normal saline bolus  ordered which improved her blood pressure with systolic in 100.  Acute on chronic diastolic CHF Severe pulmonary hypertension Moderate to severe tricuspid regurgitation and bilateral pleural effusions - Due to hypotension this morning patient had to be given 500 cc of normal saline bolus.  Will closely monitor her volume status.  She is at a very high risk of developing overt CHF which patient and family is aware but at this time IV fluids as necessary given hypotension. 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  Needs to quit drinking alcohol and follow-up with outpatient gastroenterology.  Provide supportive care  Tobacco use -Nicotine patch as needed  Physical therapy 4/14 recommended skilled nursing facility  DVT prophylaxis: Subcutaneous heparin Code Status: Full code Family Communication: Son is at the bedside during my evaluation Disposition Plan: Continue inpatient stay for another 2-3 days  Consultants:   Interventional radiology  Surgery   Subjective: Nurse notified me that earlier this morning patient systolic blood pressure was in 70s and later in 80s.  Patient was asymptomatic but did have some low-grade temperature overnight.  When I saw the patient she was sitting up at the side of the bed without any complaints.  Ordered 500 cc of normal saline bolus and towards the completion of it her blood pressure had improved to systolic of 100.  Son is at the bedside as well.  Review of Systems Otherwise negative except as per HPI, including: HEENT/EYES = negative for pain, redness, loss of vision, double vision, blurred vision, loss of hearing,  sore throat, hoarseness, dysphagia Cardiovascular= negative for chest pain, palpitation, murmurs, lower extremity  swelling Respiratory/lungs= negative for shortness of breath, cough, hemoptysis, wheezing, mucus production Gastrointestinal= negative for nausea, vomiting,, abdominal pain, melena, hematemesis Genitourinary= negative for Dysuria, Hematuria, Change in Urinary Frequency MSK = Negative for arthralgia, myalgias, Back Pain, Joint swelling  Neurology= Negative for headache, seizures, numbness, tingling  Psychiatry= Negative for anxiety, depression, suicidal and homocidal ideation Allergy/Immunology= Medication/Food allergy as listed  Skin= Negative for Rash, lesions, ulcers, itching  Objective: Vitals:   08/24/17 0144 08/24/17 0355 08/24/17 0419 08/24/17 0815  BP:    (!) 84/46  Pulse: 80     Resp: 20     Temp:  98.9 F (37.2 C) 100.2 F (37.9 C)   TempSrc:  Oral Oral   SpO2: 100%     Weight:      Height:        Intake/Output Summary (Last 24 hours) at 08/24/2017 1003 Last data filed at 08/24/2017 0419 Gross per 24 hour  Intake 383 ml  Output 725 ml  Net -342 ml   Filed Weights   08/15/17 1009 08/15/17 1846  Weight: 46.7 kg (103 lb) 46.7 kg (103 lb)    Examination: Constitutional: NAD, calm, comfortable, elderly frail-appearing Eyes: PERRL, lids and conjunctivae normal ENMT: Mucous membranes are moist. Posterior pharynx clear of any exudate or lesions.Normal dentition.  Neck: normal, supple, no masses, no thyromegaly Respiratory: clear to auscultation bilaterally, no wheezing, no crackles. Normal respiratory effort. No accessory muscle use.  Cardiovascular: Regular rate and rhythm, no murmurs / rubs / gallops. No extremity edema. 2+ pedal pulses. No carotid bruits.  Abdomen: Slightly distended abdomen with ascites, and nontender to palpation, no masses felt.  Removed drain site noted Musculoskeletal: no clubbing / cyanosis. No joint deformity upper and lower extremities. Good ROM, no contractures. Normal muscle tone.  Skin: no rashes, lesions, ulcers. No induration Neurologic:  CN 2-12 grossly intact. Sensation intact, DTR normal. Strength 5/5 in all 4.  Psychiatric: Normal judgment and insight. Alert and oriented x 3. Normal mood.   Data Reviewed:   CBC: Recent Labs  Lab 08/20/17 0326 08/21/17 0415 08/22/17 0544 08/23/17 0500 08/24/17 0447  WBC 14.9* 14.8* 20.1* 20.5* 23.1*  HGB 11.2* 11.5* 13.3 11.1* 11.4*  HCT 35.5* 35.2* 41.4 35.8* 36.3  MCV 84.5 82.4 83.3 85.6 87.5  PLT 192 199 261 300 346   Basic Metabolic Panel: Recent Labs  Lab 08/18/17 0546 08/20/17 0326 08/21/17 0415 08/22/17 0544 08/23/17 0500 08/24/17 0447  NA 142 139 135 134* 133*  --   K 3.8 3.8 3.4* 4.3 4.4  --   CL 107 103 97* 91* 92*  --   CO2 34* 33*  --   GLUCOSE 95 92 100* 98 83  --   BUN 43* 12 5* 5* 9  --   CREATININE 0.80 0.39* 0.39* 0.54 0.82  --   CALCIUM 9.4 8.7* 8.1* 8.5* 8.2*  --   MG  --  1.0* 0.9* 1.5* 1.7 1.6*   GFR: Estimated Creatinine Clearance: 47.7 mL/min (by C-G formula based on SCr of 0.82 mg/dL). Liver Function Tests: Recent Labs  Lab 08/21/17 0415 08/22/17 0544 08/23/17 0500  AST ALT 9* 9* 9*  ALKPHOS 136* 141* 120  BILITOT 1.4* 1.3* 1.2  PROT 5.0* 6.1* 5.7*  ALBUMIN 2.0* 2.2* 2.0*   No results for input(s): LIPASE, AMYLASE in the last 168 hours. No results for input(s): AMMONIA  in the last 168 hours. Coagulation Profile: No results for input(s): INR, PROTIME in the last 168 hours. Cardiac Enzymes: No results for input(s): CKTOTAL, CKMB, CKMBINDEX, TROPONINI in the last 168 hours. BNP (last 3 results) No results for input(s): PROBNP in the last 8760 hours. HbA1C: No results for input(s): HGBA1C in the last 72 hours. CBG: Recent Labs  Lab 08/21/17 2009 08/22/17 0001 08/22/17 0344 08/22/17 0819 08/22/17 1158  GLUCAP 126* 100* 86 91 119*   Lipid Profile: No results for input(s): CHOL, HDL, LDLCALC, TRIG, CHOLHDL, LDLDIRECT in the last 72 hours. Thyroid Function Tests: No results for input(s): TSH, T4TOTAL,  FREET4, T3FREE, THYROIDAB in the last 72 hours. Anemia Panel: No results for input(s): VITAMINB12, FOLATE, FERRITIN, TIBC, IRON, RETICCTPCT in the last 72 hours. Sepsis Labs: No results for input(s): PROCALCITON, LATICACIDVEN in the last 168 hours.  Recent Results (from the past 240 hour(s))  Blood Culture (routine x 2)     Status: None   Collection Time: 08/15/17 10:14 AM  Result Value Ref Range Status   Specimen Description BLOOD RIGHT ANTECUBITAL  Final   Special Requests   Final    BOTTLES DRAWN AEROBIC AND ANAEROBIC Blood Culture adequate volume   Culture   Final    NO GROWTH 5 DAYS Performed at [facility], [address], [place], [place] [address]    Report Status 08/20/2017 FINAL  Final  Blood Culture (routine x 2)     Status: None   Collection Time: 08/15/17 10:51 AM  Result Value Ref Range Status   Specimen Description BLOOD LEFT WRIST  Final   Special Requests   Final    BOTTLES DRAWN AEROBIC AND ANAEROBIC Blood Culture adequate volume   Culture   Final    NO GROWTH 5 DAYS Performed at [facility], [address] [place], [place] [address]    Report Status 08/20/2017 FINAL  Final  Aerobic/Anaerobic Culture (surgical/deep wound)     Status: None   Collection Time: 08/15/17  4:42 PM  Result Value Ref Range Status   Specimen Description ABSCESS  Final   Special Requests PARACENTESIS  Final   Gram Stain   Final    ABUNDANT WBC PRESENT, PREDOMINANTLY PMN ABUNDANT GRAM POSITIVE COCCI    Culture   Final    ABUNDANT METHICILLIN RESISTANT STAPHYLOCOCCUS AUREUS NO ANAEROBES ISOLATED Performed at [facility], [address] [place], [place] [address]    Report Status 08/21/2017 FINAL  Final   Organism ID, Bacteria METHICILLIN RESISTANT STAPHYLOCOCCUS AUREUS  Final      Susceptibility   Methicillin resistant staphylococcus aureus - MIC*    CIPROFLOXACIN >=8 RESISTANT Resistant     ERYTHROMYCIN >=8 RESISTANT Resistant     GENTAMICIN <=0.5  SENSITIVE Sensitive     OXACILLIN >=4 RESISTANT Resistant     TETRACYCLINE <=1 SENSITIVE Sensitive     VANCOMYCIN 1 SENSITIVE Sensitive     TRIMETH/SULFA <=10 SENSITIVE Sensitive     CLINDAMYCIN RESISTANT Resistant     RIFAMPIN <=0.5 SENSITIVE Sensitive     Inducible Clindamycin POSITIVE Resistant     * ABUNDANT METHICILLIN RESISTANT STAPHYLOCOCCUS AUREUS  MRSA PCR Screening     Status: None   Collection Time: 08/16/17  4:58 AM  Result Value Ref Range Status   MRSA by PCR NEGATIVE NEGATIVE Final    Comment:        The GeneXpert MRSA Assay (FDA approved for NASAL specimens only), is one component of  a comprehensive MRSA colonization surveillance program. It is not intended to diagnose MRSA infection nor to guide or monitor treatment for MRSA infections. Performed at [facility], [address], [place], [place] [address]   Culture, body fluid-bottle     Status: None (Preliminary result)   Collection Time: 08/20/17 12:41 PM  Result Value Ref Range Status   Specimen Description PERITONEAL  Final   Special Requests   Final    NONE Performed at [facility], [address] [place], [place] [address]    Gram Stain   Final    ANAEROBIC BOTTLE ONLY GRAM POSITIVE COCCI CRITICAL RESULT CALLED TO, READ BACK BY AND VERIFIED WITH: A JETER RN 08/23/17 0612 JDW    Culture GRAM POSITIVE COCCI  Final   Report Status PENDING  Incomplete  Gram stain     Status: None   Collection Time: 08/20/17 12:41 PM  Result Value Ref Range Status   Specimen Description PERITONEAL  Final   Special Requests NONE  Final   Gram Stain   Final    FEW WBC PRESENT, PREDOMINANTLY PMN NO ORGANISMS SEEN Performed at [facility], [address], [place], [place] [address]    Report Status 08/20/2017 FINAL  Final         Radiology Studies: No results found.      Scheduled Meds: . benzonatate  100 mg Oral BID  . enoxaparin (LOVENOX) injection  30 mg Subcutaneous Q24H  . feeding  supplement  1 Container Oral TID BM  . guaiFENesin  600 mg Oral BID  . nicotine  14 mg Transdermal Daily  . sodium chloride flush  5 mL Intracatheter Q8H   Continuous Infusions: . meropenem (MERREM) IV    . methocarbamol (ROBAXIN)  IV    . vancomycin 750 mg (08/24/17 0807)     LOS: 9 days    I have spent 35 minutes face to face with the patient and on the ward discussing the patients care, assessment, plan and disposition with other care givers. >50% of the time was devoted counseling the patient about the risks and benefits of treatment and coordinating care.     Yotam Rhine Joline Maxcy, MD [facility] Pager [phone]   If 7PM-7AM, please contact night-coverage [email or web address] Password [email or web address] 08/24/2017, 10:03 AM

## 2017-08-25 DIAGNOSIS — I071 Rheumatic tricuspid insufficiency: Secondary | ICD-10-CM

## 2017-08-25 DIAGNOSIS — R188 Other ascites: Secondary | ICD-10-CM

## 2017-08-25 DIAGNOSIS — K7460 Unspecified cirrhosis of liver: Secondary | ICD-10-CM

## 2017-08-25 DIAGNOSIS — Z880 Allergy status to penicillin: Secondary | ICD-10-CM

## 2017-08-25 DIAGNOSIS — B9562 Methicillin resistant Staphylococcus aureus infection as the cause of diseases classified elsewhere: Secondary | ICD-10-CM

## 2017-08-25 DIAGNOSIS — Z8379 Family history of other diseases of the digestive system: Secondary | ICD-10-CM

## 2017-08-25 DIAGNOSIS — Z91048 Other nonmedicinal substance allergy status: Secondary | ICD-10-CM

## 2017-08-25 DIAGNOSIS — I5032 Chronic diastolic (congestive) heart failure: Secondary | ICD-10-CM

## 2017-08-25 DIAGNOSIS — R509 Fever, unspecified: Secondary | ICD-10-CM

## 2017-08-25 DIAGNOSIS — F1099 Alcohol use, unspecified with unspecified alcohol-induced disorder: Secondary | ICD-10-CM

## 2017-08-25 DIAGNOSIS — Z9049 Acquired absence of other specified parts of digestive tract: Secondary | ICD-10-CM

## 2017-08-25 DIAGNOSIS — D72829 Elevated white blood cell count, unspecified: Secondary | ICD-10-CM

## 2017-08-25 DIAGNOSIS — F17210 Nicotine dependence, cigarettes, uncomplicated: Secondary | ICD-10-CM

## 2017-08-25 DIAGNOSIS — K651 Peritoneal abscess: Secondary | ICD-10-CM

## 2017-08-25 DIAGNOSIS — K659 Peritonitis, unspecified: Secondary | ICD-10-CM

## 2017-08-25 DIAGNOSIS — I2722 Pulmonary hypertension due to left heart disease: Secondary | ICD-10-CM

## 2017-08-25 DIAGNOSIS — Z5181 Encounter for therapeutic drug level monitoring: Secondary | ICD-10-CM

## 2017-08-25 DIAGNOSIS — Z881 Allergy status to other antibiotic agents status: Secondary | ICD-10-CM

## 2017-08-25 LAB — URINALYSIS, ROUTINE W REFLEX MICROSCOPIC
Bilirubin Urine: NEGATIVE
GLUCOSE, UA: NEGATIVE mg/dL
Ketones, ur: NEGATIVE mg/dL
Leukocytes, UA: NEGATIVE
Nitrite: NEGATIVE
PH: 5 (ref 5.0–8.0)
Protein, ur: NEGATIVE mg/dL
Specific Gravity, Urine: 1.014 (ref 1.005–1.030)

## 2017-08-25 LAB — GLUCOSE, CAPILLARY
GLUCOSE-CAPILLARY: 121 mg/dL — AB (ref 65–99)
GLUCOSE-CAPILLARY: 66 mg/dL (ref 65–99)
GLUCOSE-CAPILLARY: 81 mg/dL (ref 65–99)
GLUCOSE-CAPILLARY: 82 mg/dL (ref 65–99)
Glucose-Capillary: 73 mg/dL (ref 65–99)
Glucose-Capillary: 63 mg/dL — ABNORMAL LOW (ref 65–99)
Glucose-Capillary: 114 mg/dL — ABNORMAL HIGH (ref 65–99)
Glucose-Capillary: 88 mg/dL (ref 65–99)

## 2017-08-25 LAB — CBC
HCT: 33.3 % — ABNORMAL LOW (ref 36.0–46.0)
HEMOGLOBIN: 10.4 g/dL — AB (ref 12.0–15.0)
MCH: 26.9 pg (ref 26.0–34.0)
MCHC: 31.2 g/dL (ref 30.0–36.0)
MCV: 86.3 fL (ref 78.0–100.0)
PLATELETS: 443 10*3/uL — AB (ref 150–400)
RBC: 3.86 MIL/uL — AB (ref 3.87–5.11)
RDW: 15.2 % (ref 11.5–15.5)
WBC: 25.4 10*3/uL — AB (ref 4.0–10.5)

## 2017-08-25 LAB — MAGNESIUM: MAGNESIUM: 1.4 mg/dL — AB (ref 1.7–2.4)

## 2017-08-25 MED ORDER — MAGNESIUM OXIDE 400 (241.3 MG) MG PO TABS
800.0000 mg | ORAL_TABLET | Freq: Two times a day (BID) | ORAL | Status: AC
  Administered 2017-08-25 (×2): 800 mg via ORAL
  Filled 2017-08-25 (×2): qty 2

## 2017-08-25 NOTE — Progress Notes (Signed)
Patient declined getting OOB to chair today around 4pm. Stated I should have asked earlier.

## 2017-08-25 NOTE — Consult Note (Signed)
[facility]  Total days of antibiotics 11        Day 11 vanco        Day 6 meropenem ( intermittent)               Reason for Consult: fever, leukocytosis    Referring Physician: intra-abdominal abscess  Principal Problem:   Intra-abdominal abscess s/p perc drainage 08/15/2017 Active Problems:   Hypokalemia   Smoker   Severe sepsis (HCC)   Chronic diastolic CHF (congestive heart failure) (HCC)   Acute respiratory failure (HCC)   Pleural effusion   Hepatic cirrhosis (HCC)    HPI: Rachel Williams is a 67 y.o. female with multiple health problems including diastolic heart failure, severe pulm HTN, moderate TR, hx of colon perforation s/p colectomy s/p take down, and cirrhosis. She was admitted for 1 week hx of abdominal pain and fevers, where she presented with sepsis. Started on empiric regimen of vanco and meropenem but found to have abdominal ascities, and abdominal imaging fournd 9 cm abscess with loculated ascites. She underwent IR procedure with abscess fluid drained with small jp drain in place, as well as of milky tan ascites fluid. She had 2nd paracentesis on 5/15 with addn milky ascities fluid retrieved. Both cx showing MRSA. Since 5/16, having increasing leukocytosis and low grade fever 3 days ago concerning for ongoing source. She had blood cx drawn today. She is felling fatigued, still having abdominal pain from recent manipulations with drains.    Past Medical History:  Diagnosis Date  . Acute CHF (congestive heart failure) (HCC) 06/24/2017  . ETOH abuse   . Pneumonia     Allergies:  Allergies  Allergen Reactions  . Ciprofloxacin Rash  . Levofloxacin Rash    Rash on back after 5 days therapy.   . Nickel Rash    Anything with nickel in it.   Marland Kitchen Penicillins Rash    Tolerates cephalosporins   Has patient had a PCN reaction causing immediate rash, facial/tongue/throat swelling, SOB or lightheadedness with hypotension:  No Has patient had a PCN reaction causing severe rash involving mucus membranes or skin necrosis: No Ha patient had a PCN reaction that required hospitalization No Has patient had a PCN reaction occurring within the last 10 years: No If all of the above answers are "NO", then may proceed with: Cephalosporins        MEDICATIONS: . benzonatate  100 mg Oral BID  . enoxaparin (LOVENOX) injection  30 mg Subcutaneous Q24H  . feeding supplement  1 Container Oral TID BM  . guaiFENesin  600 mg Oral BID  . magnesium oxide  800 mg Oral BID  . nicotine  14 mg Transdermal Daily  . sodium chloride flush  5 mL Intracatheter Q8H    Social History   Tobacco Use  . Smoking status: Current Every Day Smoker    Packs/day: 1.00    Years: 50.00    Pack years: 50.00    Types: Cigarettes  . Smokeless tobacco: Never Used  Substance Use Topics  . Alcohol use: Yes    Alcohol/week: 8.4 oz    Types: 14 Standard drinks or equivalent per week    Comment: daily, >6 per daughter  . Drug use: No    Family History  Problem Relation Age of Onset  . Alzheimer's disease Mother   . Cancer Sister   . Cirrhosis Father   . Hemochromatosis Other     Review  of Systems -  Aside from abdominal discomfort, fatigue, deconditioned, 12 point ros is negative   OBJECTIVE: Temp:  [98.3 F (36.8 C)-99.4 F (37.4 C)] 99.4 F (37.4 C) (05/20 1618) Pulse Rate:  [79-105] 90 (05/20 1636) Resp:  [11-20] 14 (05/20 1636) BP: (95-127)/(48-69) 95/48 (05/20 1636) SpO2:  [98 %-100 %] 100 % (05/20 1636) Physical Exam  Constitutional:  oriented to person, place, and time. appears well-developed and well-nourished. No distress.  HENT: [place]/AT, PERRLA, no scleral icterus Mouth/Throat: Oropharynx is clear and moist. No oropharyngeal exudate.  Cardiovascular: Normal rate, regular rhythm and normal heart sounds. Exam reveals no gallop and no friction rub.  No murmur heard.  Pulmonary/Chest: Effort normal and breath sounds  normal. No respiratory distress.  has no wheezes.  Neck = supple, no nuchal rigidity Abdominal: Soft. Bowel sounds are decreased. Distended. mildy tender to palpatoin.scar from previous surgery. Bandaged at  2 sites, prior paracentesis site Lymphadenopathy: no cervical adenopathy. No axillary adenopathy Neurological: alert and oriented to person, place, and time.  Skin: Skin is warm and dry. No rash noted. No erythema.  Psychiatric: a normal mood and affect.  behavior is normal.    LABS: Results for orders placed or performed during the hospital encounter of 08/15/17 (from the past 48 hour(s))  CBC     Status: Abnormal   Collection Time: 08/24/17  4:47 AM  Result Value Ref Range   WBC 23.1 (H) 4.0 - 10.5 K/uL   RBC 4.15 3.87 - 5.11 MIL/uL   Hemoglobin 11.4 (L) 12.0 - 15.0 g/dL   HCT 16.1 09.6 - 04.5 %   MCV 87.5 78.0 - 100.0 fL   MCH 27.5 26.0 - 34.0 pg   MCHC 31.4 30.0 - 36.0 g/dL   RDW 40.9 81.1 - 91.4 %   Platelets 346 150 - 400 K/uL    Comment: Performed at [facility], [address] [place], [place] [address]  Magnesium     Status: Abnormal   Collection Time: 08/24/17  4:47 AM  Result Value Ref Range   Magnesium 1.6 (L) 1.7 - 2.4 mg/dL    Comment: Performed at [facility], [address] [place], [place] [address]  Glucose, capillary     Status: Abnormal   Collection Time: 08/24/17  8:08 PM  Result Value Ref Range   Glucose-Capillary 114 (H) 65 - 99 mg/dL  Glucose, capillary     Status: None   Collection Time: 08/24/17 11:52 PM  Result Value Ref Range   Glucose-Capillary 95 65 - 99 mg/dL  Glucose, capillary     Status: None   Collection Time: 08/25/17  4:14 AM  Result Value Ref Range   Glucose-Capillary 73 65 - 99 mg/dL  CBC     Status: Abnormal   Collection Time: 08/25/17  5:18 AM  Result Value Ref Range   WBC 25.4 (H) 4.0 - 10.5 K/uL    Comment: REPEATED TO VERIFY   RBC 3.86 (L) 3.87 - 5.11 MIL/uL   Hemoglobin 10.4 (L) 12.0 - 15.0 g/dL     Comment: REPEATED TO VERIFY   HCT 33.3 (L) 36.0 - 46.0 %   MCV 86.3 78.0 - 100.0 fL   MCH 26.9 26.0 - 34.0 pg   MCHC 31.2 30.0 - 36.0 g/dL   RDW 86.5 78.4 - 69.6 %   Platelets 443 (H) 150 - 400 K/uL    Comment: REPEATED TO VERIFY Performed at [facility], [address], [place],  [place] [address]   Magnesium     Status: Abnormal   Collection Time: 08/25/17  5:18 AM  Result Value Ref Range   Magnesium 1.4 (L) 1.7 - 2.4 mg/dL    Comment: Performed at [facility], [address] [place], [place] [address]  Glucose, capillary     Status: None   Collection Time: 08/25/17  7:59 AM  Result Value Ref Range   Glucose-Capillary 66 65 - 99 mg/dL  Glucose, capillary     Status: Abnormal   Collection Time: 08/25/17  8:47 AM  Result Value Ref Range   Glucose-Capillary 63 (L) 65 - 99 mg/dL  Glucose, capillary     Status: None   Collection Time: 08/25/17  9:15 AM  Result Value Ref Range   Glucose-Capillary 81 65 - 99 mg/dL  Urinalysis, Routine w reflex microscopic     Status: Abnormal   Collection Time: 08/25/17 10:10 AM  Result Value Ref Range   Color, Urine YELLOW YELLOW   APPearance HAZY (A) CLEAR   Specific Gravity, Urine 1.014 1.005 - 1.030   pH 5.0 5.0 - 8.0   Glucose, UA NEGATIVE NEGATIVE mg/dL   Hgb urine dipstick SMALL (A) NEGATIVE   Bilirubin Urine NEGATIVE NEGATIVE   Ketones, ur NEGATIVE NEGATIVE mg/dL   Protein, ur NEGATIVE NEGATIVE mg/dL   Nitrite NEGATIVE NEGATIVE   Leukocytes, UA NEGATIVE NEGATIVE   RBC / HPF 0-5 0 - 5 RBC/hpf   WBC, UA 0-5 0 - 5 WBC/hpf   Bacteria, UA FEW (A) NONE SEEN   Squamous Epithelial / LPF 0-5 0 - 5   Mucus PRESENT     Comment: Performed at [facility], [address], [place], [place] [address]  Glucose, capillary     Status: Abnormal   Collection Time: 08/25/17 12:17 PM  Result Value Ref Range   Glucose-Capillary 114 (H) 65 - 99 mg/dL  Glucose, capillary     Status: None   Collection Time: 08/25/17  4:51 PM  Result  Value Ref Range   Glucose-Capillary 88 65 - 99 mg/dL    MICRO: 9/14 mrsa 7/82 few staph aureus  5/20 blood cx pending IMAGING: No results found.  HISTORICAL MICRO/IMAGING  Assessment/Plan:  67 yo F with intra-abdominal mrsa abscess with secondary loculated peritonitis  Recommend repeat paracentesis with cell count and culture to see that she is improving. Concern that she had 5 days of treatment from 5/10 to 5/15 and still had MRSA isolated from paracentesis, unclear why there was not cell count on initial body fluid cx on 5/10 possibly unable to do so. Difficult to tell if a second pathogen (typical gram negative) is causing abd infection.   - continue on vancomycin for mrsa abdominal infection. Continue with therapeutic drug monitor to ensure she is not supratherapeutic on meds.  - would plan to treat for 2 weeks and then switch her later to oral abtx.  -Would recommened repeat paracentesis with cell coutnt and cultures of asictes is improving.  - source of mrsa somewhat not clear. Unusual that it is just one isolate from intra-abd infection.  Will provide further recs tomorrow -

## 2017-08-25 NOTE — Progress Notes (Signed)
Initial Nutrition Assessment  DOCUMENTATION CODES:   Severe malnutrition in context of chronic illness  INTERVENTION:  Snacks ordered  Magic cup TID with meals, each supplement provides 290 kcal and 9 grams of protein  D/C boost breeze, patient complains this is too sweet.  NUTRITION DIAGNOSIS:   Severe Malnutrition related to chronic illness as evidenced by severe fat depletion, severe muscle depletion, percent weight loss.  GOAL:   Patient will meet greater than or equal to 90% of their needs  MONITOR:   PO intake  REASON FOR ASSESSMENT:   Malnutrition Screening Tool    ASSESSMENT:   67 yo female with PMH CHF, Pulmonary HTN, Smoker, ETOH abuse, history bowel perf s/p colectomy, presents with abd pain, septic shock secondary to intra-abdominal abscess vs possible perf diverticulitis. Gen surgery recommended IR guided drain placement, placed 5/10, also underwent paracentesis 5/10 due to ascites.  Now on IV ABx due to rising WBC, low grade temp, and hypotension. Drain removed 5/18.  RD drawn to patient for positive MST. Spoke with Rachel Williams at bedside. She used to working in nursing administration here.  Patient states that she has lost a pound a day for 6 weeks from 144 pounds down to 103 pounds. She reports still eating over that time but that she ate about 50% of her normal portion. 24 hour recall as follows:  Breakfast: Rice krispy cereal, oatmeal, coffee and juice  Lunch:  Soup or Salad with an entree (meat, vegetables, and a starch) and a glass of white wine, water.  Dinner: Similar to lunch   Patient does have a history of ETOH abuse and tobacco use, both of which can contribute to decreased appetite and malnutrition. Stated weight loss is a 41 pound/28% severe weight loss over 1.5 months. Per chart she was 131 pounds on 06/26/2017, still indicating a 28 pound/21% severe weight loss.  Given weight loss, muscle wasting and fat depletions, patient meets criteria  for severe malnutrition in context of chronic illness. Will monitor PO intake.  Labs reviewed:  Na 133,  CBGs 114, 81, 63  Medications reviewed and include:  MgOx,   NUTRITION - FOCUSED PHYSICAL EXAM:    Most Recent Value  Orbital Region  Severe depletion  Upper Arm Region  Severe depletion  Thoracic and Lumbar Region  Severe depletion  Buccal Region  Severe depletion  Temple Region  Severe depletion  Clavicle Bone Region  Severe depletion  Clavicle and Acromion Bone Region  Severe depletion  Scapular Bone Region  Severe depletion  Dorsal Hand  Severe depletion  Patellar Region  Moderate depletion  Anterior Thigh Region  Moderate depletion  Posterior Calf Region  Moderate depletion       Diet Order:   Diet Order           DIET SOFT Room service appropriate? Yes; Fluid consistency: Thin  Diet effective now          EDUCATION NEEDS:   Not appropriate for education at this time  Skin:  Skin Assessment: Reviewed RN Assessment(MASD to abdomen)  Last BM:  08/22/2017  Height:   Ht Readings from Last 1 Encounters:  08/15/17  (1.575 m)    Weight:   Wt Readings from Last 1 Encounters:  08/15/17 103 lb (46.7 kg)    Ideal Body Weight:  50 kg  BMI:  Body mass index is 18.84 kg/m.  Estimated Nutritional Needs:   Kcal:  1610-9604 calories  Protein:  70-80 grams (1.5-1.7g/kg)  Fluid:  1.4-1.7L  Rachel Williams. Rachel Schrecengost, MS, RD LDN Inpatient Clinical Dietitian Pager [phone]

## 2017-08-25 NOTE — Progress Notes (Addendum)
CC: ABDOMINAL PAIN  Subjective: More distended today, but tolerating diet.  Does not feel uncomfortable.    Objective: Vital signs in last 24 hours: Temp:  [98 F (36.7 C)-99.1 F (37.3 C)] 98.3 F (36.8 C) (05/20 0800) Pulse Rate:  [74-105] 85 (05/20 0019) Resp:  [14-17] 15 (05/20 0019) BP: (101-127)/(55-69) 108/55 (05/20 0442) SpO2:  [98 %-100 %] 100 % (05/20 0442) Last BM Date: 08/22/17 500 PO 500 IV 650 urine Afebrile,BP low normal WBC 25.4 this is up, H/H down slightly Specimen Description ABSCESS   Special Requests PARACENTESIS   Gram Stain ABUNDANT WBC PRESENT, PREDOMINANTLY PMN  ABUNDANT GRAM POSITIVE COCCI     Culture ABUNDANT METHICILLIN RESISTANT STAPHYLOCOCCUS AUREUS  NO ANAEROBES ISOLATED  Performed at [facility], [address], [place], [place] [address]     Report Status 08/21/2017 FINAL   Organism ID, Bacteria METHICILLIN RESISTANT STAPHYLOCOCCUS AUREUS      Intake/Output from previous day: 05/19 0701 - 05/20 0700 In: 1000 [P.O.:500; IV Piggyback:500] Out: 650 [Urine:650] Intake/Output this shift: No intake/output data recorded.  General appearance: alert, cooperative and no distress GI: distended, + BS, not really tender, drain is out.    Lab Results:  Recent Labs    08/24/17 0447 08/25/17 0518  WBC 23.1* 25.4*  HGB 11.4* 10.4*  HCT 36.3 33.3*  PLT 346 443*    BMET Recent Labs    08/23/17 0500  NA 133*  K 4.4  CL 92*  CO2 33*  GLUCOSE 83  BUN 9  CREATININE 0.82  CALCIUM 8.2*   PT/INR No results for input(s): LABPROT, INR in the last 72 hours.  Recent Labs  Lab 08/21/17 0415 08/22/17 0544 08/23/17 0500  AST ALT 9* 9* 9*  ALKPHOS 136* 141* 120  BILITOT 1.4* 1.3* 1.2  PROT 5.0* 6.1* 5.7*  ALBUMIN 2.0* 2.2* 2.0*     Lipase     Component Value Date/Time   LIPASE 18 08/15/2017 1002     Medications: . benzonatate  100 mg Oral BID  . enoxaparin (LOVENOX) injection  30 mg Subcutaneous Q24H   . feeding supplement  1 Container Oral TID BM  . guaiFENesin  600 mg Oral BID  . magnesium oxide  800 mg Oral BID  . nicotine  14 mg Transdermal Daily  . sodium chloride flush  5 mL Intracatheter Q8H   Anti-infectives (From admission, onward)   Start     Dose/Rate Route Frequency Ordered Stop   08/24/17 1000  meropenem (MERREM) 1 g in sodium chloride 0.9 % 100 mL IVPB     1 g 200 mL/hr over 30 Minutes Intravenous Every 12 hours 08/24/17 0816     08/19/17 0945  vancomycin (VANCOCIN) IVPB 750 mg/150 ml premix     750 mg 150 mL/hr over 60 Minutes Intravenous Every 12 hours 08/19/17 0942     08/17/17 2030  meropenem (MERREM) 500 mg in sodium chloride 0.9 % 100 mL IVPB  Status:  Discontinued     500 mg 200 mL/hr over 30 Minutes Intravenous Every 12 hours 08/17/17 1429 08/19/17 0740   08/17/17 0900  vancomycin (VANCOCIN) 500 mg in sodium chloride 0.9 % 100 mL IVPB  Status:  Discontinued     500 mg 100 mL/hr over 60 Minutes Intravenous Every 24 hours 08/16/17 0816 08/19/17 0941   08/16/17 1200  cefTRIAXone (ROCEPHIN) 1 g in sodium chloride 0.9 % 100 mL IVPB  Status:  Discontinued  1 g 200 mL/hr over 30 Minutes Intravenous Every 24 hours 08/15/17 1351 08/16/17 0756   08/16/17 0830  vancomycin (VANCOCIN) IVPB 1000 mg/200 mL premix     1,000 mg 200 mL/hr over 60 Minutes Intravenous  Once 08/16/17 0816 08/16/17 1117   08/16/17 0830  meropenem (MERREM) 1 g in sodium chloride 0.9 % 100 mL IVPB  Status:  Discontinued     1 g 200 mL/hr over 30 Minutes Intravenous Every 12 hours 08/16/17 0816 08/17/17 1429   08/15/17 2200  metroNIDAZOLE (FLAGYL) IVPB 500 mg  Status:  Discontinued     500 mg 100 mL/hr over 60 Minutes Intravenous Every 8 hours 08/15/17 1351 08/16/17 0757   08/15/17 1115  cefTRIAXone (ROCEPHIN) 2 g in sodium chloride 0.9 % 100 mL IVPB     2 g 200 mL/hr over 30 Minutes Intravenous  Once 08/15/17 1113 08/15/17 1221   08/15/17 1115  metroNIDAZOLE (FLAGYL) IVPB 500 mg     500  mg 100 mL/hr over 60 Minutes Intravenous  Once 08/15/17 1113 08/15/17 1317      Assessment/Plan Septic shock secondary to Intra-abdominal abscess(resolved sepsis) MRSA -tolerating soft diet, breeze -para only with 250cc removed secondary to loculations. Cell count still shows elevated WBC. Await cx to see if MRSA has cleared, NGTD.  Suspect distention today may be secondary to more ascites. -cont vanc and merrem -drain per IR, REMOVED 08/23/17.  -WBCis stable 20K today  Cirrhosis -ascites,loculated.  Suspect increase in distention may be secondary to her ascites given her most recent scan showed an increase in ascitic fluid. Deconditioning  -cont PT. Patient needs to mobilize more.   Diastolic CHF/pulmonary HTN/tricuspid regurg Hypokalemia- 4.4 today COPD, tobacco abuse H/o ETOH abuse, still drinks 2 glasses of wine daily H/o compression fractures Malnutrition  FEN -soft diet, resource breeze VTE -Lovenox /daily ID -merrem/vanc 5/10 => DAY 10   Plan:  MRSA is not a diverticular organism.  She does not need any surgical intervention at this point.  We will be available if needed. Please call if we can be of further assistance.         LOS: 10 days    Rachel Williams 08/25/2017 [phone]

## 2017-08-25 NOTE — Progress Notes (Signed)
PROGRESS NOTE    Rachel Williams  ZOX:[id number] DOB: 04/03/1951 DOA: 08/15/2017 PCP: Sigmund Hazel, MD   Brief Narrative:  68 year old with a history of chronic diastolic CHF, pulmonary hypertension, active smoker, alcohol abuse, history of bowel perforation status post colectomy, moderate to severe tricuspid regurgitation presented to the hospital with complaints of abdominal pain.  Patient was found to be in septic shock secondary to intra-abdominal abscess versus possible perforated diverticulitis.  CT of the abdomen pelvis showed moderate ascites with 9 cm irregular cystic mass in the left paracolic gutter concerning for abscess.  General surgery was consulted who recommended getting IR guided drain placement which was placed on 5/10.  Due to ascites underwent paracentesis on 5/10 as well.  Cultures ended up growing staph aureus from the drain as well as an narrow therefore patient was started on IV Rocephin and Flagyl and later transitioned to IV meropenem.  Vancomycin was also added.  Case was discussed with Dr. Ninetta Lights from infectious disease who recommended continuing current antibiotics and eventually transitioning to oral Bactrim.  General surgery has been following the patient.   Assessment & Plan:   Principal Problem:   Intra-abdominal abscess s/p perc drainage 08/15/2017 Active Problems:   Hypokalemia   Smoker   Severe sepsis (HCC)   Chronic diastolic CHF (congestive heart failure) (HCC)   Acute respiratory failure (HCC)   Pleural effusion   Hepatic cirrhosis (HCC)   Septic shock secondary to intra-abdominal abscess with hypotension this morning - Blood pressure has improved, ordered - Currently patient is on vancomycin and meropenem. IR removed drain 5/18 -I have consulted infectious disease to help determine her antibiotics and length of treatment.  Acute on chronic diastolic CHF, stable Severe pulmonary hypertension Moderate to severe tricuspid regurgitation and bilateral  pleural effusions - Her breathing status appears to be stable.  We will keep a close eye out on her volume status as she is in a very high risk of developing overt CHF. -Echocardiogram from March 2019 shows ejection fraction 60 to 65%, grade 1 diastolic dysfunction  Hypomagnesemia and hypokalemia; improved  -Replete electrolytes as necessary  Alcohol abuse Cirrhosis secondary to Alcohol Abuse -MELD ~16, Child B.  Needs to quit drinking alcohol and follow-up with outpatient gastroenterology.  Provide supportive care  Tobacco use -Nicotine patch as needed  Physical therapy recommends skilled nursing facility  DVT prophylaxis: Subcutaneous heparin Code Status: Full code Family Communication: None at bedside this morning Disposition Plan: Maintain inpatient stay for another 24-48 hours  Consultants:   Interventional radiology  Surgery  Infectious disease   Subjective: Patient does not any complaints this morning.  She was able to sit in the chair for some time yesterday.  Review of Systems Otherwise negative except as per HPI, including: HEENT/EYES = negative for pain, redness, loss of vision, double vision, blurred vision, loss of hearing, sore throat, hoarseness, dysphagia Cardiovascular= negative for chest pain, palpitation, murmurs, lower extremity swelling Respiratory/lungs= negative for shortness of breath, cough, hemoptysis, wheezing, mucus production Gastrointestinal= negative for nausea, vomiting,, abdominal pain, melena, hematemesis Genitourinary= negative for Dysuria, Hematuria, Change in Urinary Frequency MSK = Negative for arthralgia, myalgias, Back Pain, Joint swelling  Neurology= Negative for headache, seizures, numbness, tingling  Psychiatry= Negative for anxiety, depression, suicidal and homocidal ideation Allergy/Immunology= Medication/Food allergy as listed  Skin= Negative for Rash, lesions, ulcers, itching   Objective: Vitals:   08/25/17 0019 08/25/17  0442 08/25/17 0800 08/25/17 0836  BP: 101/61 (!) 108/55  100/65  Pulse: 85  86  Resp: 15   20  Temp: 98.6 F (37 C) 98.6 F (37 C) 98.3 F (36.8 C)   TempSrc: Oral Oral Oral   SpO2: 98% 100%  99%  Weight:      Height:        Intake/Output Summary (Last 24 hours) at 08/25/2017 1135 Last data filed at 08/25/2017 0302 Gross per 24 hour  Intake 1000 ml  Output 650 ml  Net 350 ml   Filed Weights   08/15/17 1009 08/15/17 1846  Weight: 46.7 kg (103 lb) 46.7 kg (103 lb)    Examination: Constitutional: NAD, calm, comfortable, elderly frail-appearing Eyes: PERRL, lids and conjunctivae normal ENMT: Mucous membranes are moist. Posterior pharynx clear of any exudate or lesions.Normal dentition.  Neck: normal, supple, no masses, no thyromegaly Respiratory: Diminished breath sounds at the bases Cardiovascular: Regular rate and rhythm, no murmurs / rubs / gallops. No extremity edema. 2+ pedal pulses. No carotid bruits.  Abdomen: no tenderness, no masses palpated. No hepatosplenomegaly. Bowel sounds positive.  Surgical scar of the abdomen noted Musculoskeletal: no clubbing / cyanosis. No joint deformity upper and lower extremities. Good ROM, no contractures. Normal muscle tone.  Skin: no rashes, lesions, ulcers. No induration Neurologic: CN 2-12 grossly intact. Sensation intact, DTR normal. Strength 5/5 in all 4.  Psychiatric: Normal judgment and insight. Alert and oriented x 3. Normal mood.   Data Reviewed:   CBC: Recent Labs  Lab 08/21/17 0415 08/22/17 0544 08/23/17 0500 08/24/17 0447 08/25/17 0518  WBC 14.8* 20.1* 20.5* 23.1* 25.4*  HGB 11.5* 13.3 11.1* 11.4* 10.4*  HCT 35.2* 41.4 35.8* 36.3 33.3*  MCV 82.4 83.3 85.6 87.5 86.3  PLT 199 261 300 346 443*   Basic Metabolic Panel: Recent Labs  Lab 08/20/17 0326 08/21/17 0415 08/22/17 0544 08/23/17 0500 08/24/17 0447 08/25/17 0518  NA 139 135 134* 133*  --   --   K 3.8 3.4* 4.3 4.4  --   --   CL 103 97* 91* 92*  --   --    CO2 25 29 34* 33*  --   --   GLUCOSE 92 100* 98 83  --   --   BUN 12 5* 5* 9  --   --   CREATININE 0.39* 0.39* 0.54 0.82  --   --   CALCIUM 8.7* 8.1* 8.5* 8.2*  --   --   MG 1.0* 0.9* 1.5* 1.7 1.6* 1.4*   GFR: Estimated Creatinine Clearance: 47.7 mL/min (by C-G formula based on SCr of 0.82 mg/dL). Liver Function Tests: Recent Labs  Lab 08/21/17 0415 08/22/17 0544 08/23/17 0500  AST ALT 9* 9* 9*  ALKPHOS 136* 141* 120  BILITOT 1.4* 1.3* 1.2  PROT 5.0* 6.1* 5.7*  ALBUMIN 2.0* 2.2* 2.0*   No results for input(s): LIPASE, AMYLASE in the last 168 hours. No results for input(s): AMMONIA in the last 168 hours. Coagulation Profile: No results for input(s): INR, PROTIME in the last 168 hours. Cardiac Enzymes: No results for input(s): CKTOTAL, CKMB, CKMBINDEX, TROPONINI in the last 168 hours. BNP (last 3 results) No results for input(s): PROBNP in the last 8760 hours. HbA1C: No results for input(s): HGBA1C in the last 72 hours. CBG: Recent Labs  Lab 08/24/17 2352 08/25/17 0414 08/25/17 0759 08/25/17 0847 08/25/17 0915  GLUCAP 95 73 66 63* 81   Lipid Profile: No results for input(s): CHOL, HDL, LDLCALC, TRIG, CHOLHDL, LDLDIRECT in the last 72 hours. Thyroid Function Tests: No  results for input(s): TSH, T4TOTAL, FREET4, T3FREE, THYROIDAB in the last 72 hours. Anemia Panel: No results for input(s): VITAMINB12, FOLATE, FERRITIN, TIBC, IRON, RETICCTPCT in the last 72 hours. Sepsis Labs: No results for input(s): PROCALCITON, LATICACIDVEN in the last 168 hours.  Recent Results (from the past 240 hour(s))  Aerobic/Anaerobic Culture (surgical/deep wound)     Status: None   Collection Time: 08/15/17  4:42 PM  Result Value Ref Range Status   Specimen Description ABSCESS  Final   Special Requests PARACENTESIS  Final   Gram Stain   Final    ABUNDANT WBC PRESENT, PREDOMINANTLY PMN ABUNDANT GRAM POSITIVE COCCI    Culture   Final    ABUNDANT METHICILLIN RESISTANT  STAPHYLOCOCCUS AUREUS NO ANAEROBES ISOLATED Performed at [facility], [address] [place], [place] [address]    Report Status 08/21/2017 FINAL  Final   Organism ID, Bacteria METHICILLIN RESISTANT STAPHYLOCOCCUS AUREUS  Final      Susceptibility   Methicillin resistant staphylococcus aureus - MIC*    CIPROFLOXACIN >=8 RESISTANT Resistant     ERYTHROMYCIN >=8 RESISTANT Resistant     GENTAMICIN <=0.5 SENSITIVE Sensitive     OXACILLIN >=4 RESISTANT Resistant     TETRACYCLINE <=1 SENSITIVE Sensitive     VANCOMYCIN 1 SENSITIVE Sensitive     TRIMETH/SULFA <=10 SENSITIVE Sensitive     CLINDAMYCIN RESISTANT Resistant     RIFAMPIN <=0.5 SENSITIVE Sensitive     Inducible Clindamycin POSITIVE Resistant     * ABUNDANT METHICILLIN RESISTANT STAPHYLOCOCCUS AUREUS  MRSA PCR Screening     Status: None   Collection Time: 08/16/17  4:58 AM  Result Value Ref Range Status   MRSA by PCR NEGATIVE NEGATIVE Final    Comment:        The GeneXpert MRSA Assay (FDA approved for NASAL specimens only), is one component of a comprehensive MRSA colonization surveillance program. It is not intended to diagnose MRSA infection nor to guide or monitor treatment for MRSA infections. Performed at [facility], [address], [place], [place] [address]   Culture, body fluid-bottle     Status: Abnormal (Preliminary result)   Collection Time: 08/20/17 12:41 PM  Result Value Ref Range Status   Specimen Description PERITONEAL  Final   Special Requests NONE  Final   Gram Stain   Final    ANAEROBIC BOTTLE ONLY GRAM POSITIVE COCCI CRITICAL RESULT CALLED TO, READ BACK BY AND VERIFIED WITH: A JETER RN 08/23/17 0612 JDW    Culture (A)  Final    STAPHYLOCOCCUS AUREUS SUSCEPTIBILITIES TO FOLLOW Performed at [facility], [address] [place], [place] [address]    Report Status PENDING  Incomplete  Gram stain     Status: None   Collection Time: 08/20/17 12:41 PM  Result Value Ref Range  Status   Specimen Description PERITONEAL  Final   Special Requests NONE  Final   Gram Stain   Final    FEW WBC PRESENT, PREDOMINANTLY PMN NO ORGANISMS SEEN Performed at [facility], [address] [place], [place] [address]    Report Status 08/20/2017 FINAL  Final         Radiology Studies: No results found.      Scheduled Meds: . benzonatate  100 mg Oral BID  . enoxaparin (LOVENOX) injection  30 mg Subcutaneous Q24H  . feeding supplement  1 Container Oral TID BM  . guaiFENesin  600 mg Oral BID  . magnesium oxide  800 mg Oral BID  . nicotine  14 mg Transdermal Daily  . sodium chloride flush  5 mL Intracatheter Q8H   Continuous Infusions: . meropenem (MERREM) IV 1 g (08/25/17 1016)  . methocarbamol (ROBAXIN)  IV    . vancomycin Stopped (08/25/17 1014)     LOS: 10 days    I have spent 25 minutes face to face with the patient and on the ward discussing the patients care, assessment, plan and disposition with other care givers. >50% of the time was devoted counseling the patient about the risks and benefits of treatment and coordinating care.     Lunna Vogelgesang Joline Maxcy, MD [facility] Pager [phone]   If 7PM-7AM, please contact night-coverage [email or web address] Password [email or web address] 08/25/2017, 11:35 AM

## 2017-08-25 NOTE — Progress Notes (Addendum)
Hypoglycemic Event  CBG: 66  Treatment: 15 GM carbohydrate snack  Symptoms: Pale  Follow-up CBG: 63 Time:0847 Follow-up CBG 87  Time: 915   Possible Reasons for Event: Inadequate meal intake  Comments/MD notified:    Rachel Williams Consuella Lose

## 2017-08-26 ENCOUNTER — Inpatient Hospital Stay (HOSPITAL_COMMUNITY): Payer: Medicare Other

## 2017-08-26 ENCOUNTER — Encounter (HOSPITAL_COMMUNITY): Payer: Self-pay | Admitting: Physician Assistant

## 2017-08-26 DIAGNOSIS — E43 Unspecified severe protein-calorie malnutrition: Secondary | ICD-10-CM

## 2017-08-26 HISTORY — PX: IR PARACENTESIS: IMG2679

## 2017-08-26 LAB — GLUCOSE, CAPILLARY
GLUCOSE-CAPILLARY: 77 mg/dL (ref 65–99)
GLUCOSE-CAPILLARY: 89 mg/dL (ref 65–99)
GLUCOSE-CAPILLARY: 79 mg/dL (ref 65–99)
Glucose-Capillary: 80 mg/dL (ref 65–99)
Glucose-Capillary: 92 mg/dL (ref 65–99)

## 2017-08-26 LAB — CBC
HEMATOCRIT: 31.6 % — AB (ref 36.0–46.0)
Hemoglobin: 9.9 g/dL — ABNORMAL LOW (ref 12.0–15.0)
MCH: 27 pg (ref 26.0–34.0)
MCHC: 31.3 g/dL (ref 30.0–36.0)
MCV: 86.3 fL (ref 78.0–100.0)
PLATELETS: 502 10*3/uL — AB (ref 150–400)
RBC: 3.66 MIL/uL — ABNORMAL LOW (ref 3.87–5.11)
RDW: 15.3 % (ref 11.5–15.5)
WBC: 20.5 10*3/uL — AB (ref 4.0–10.5)

## 2017-08-26 LAB — BASIC METABOLIC PANEL
Anion gap: 8 (ref 5–15)
BUN: 9 mg/dL (ref 6–20)
CALCIUM: 8 mg/dL — AB (ref 8.9–10.3)
CO2: 31 mmol/L (ref 22–32)
CREATININE: 0.9 mg/dL (ref 0.44–1.00)
Chloride: 93 mmol/L — ABNORMAL LOW (ref 101–111)
GFR calc Af Amer: >60 mL/min (ref >60)
GLUCOSE: 80 mg/dL (ref 65–99)
Potassium: 3.6 mmol/L (ref 3.5–5.1)
Sodium: 132 mmol/L — ABNORMAL LOW (ref 135–145)

## 2017-08-26 LAB — VANCOMYCIN, TROUGH: VANCOMYCIN TR: 40 ug/mL — AB (ref 15–20)

## 2017-08-26 LAB — MAGNESIUM: MAGNESIUM: 1.4 mg/dL — AB (ref 1.7–2.4)

## 2017-08-26 LAB — BODY FLUID CELL COUNT WITH DIFFERENTIAL
Eos, Fluid: 0 %
Lymphs, Fluid: 0 %
Monocyte-Macrophage-Serous Fluid: 0 % — ABNORMAL LOW (ref 50–90)
Neutrophil Count, Fluid: 100 % — ABNORMAL HIGH (ref 0–25)
WBC FLUID: 3745 uL — AB (ref 0–1000)

## 2017-08-26 LAB — CULTURE, BODY FLUID-BOTTLE

## 2017-08-26 LAB — GRAM STAIN

## 2017-08-26 LAB — CULTURE, BODY FLUID W GRAM STAIN -BOTTLE

## 2017-08-26 MED ORDER — LIDOCAINE HCL (PF) 1 % IJ SOLN
INTRAMUSCULAR | Status: DC | PRN
  Administered 2017-08-26: 10 mL

## 2017-08-26 MED ORDER — MAGNESIUM SULFATE 2 GM/50ML IV SOLN
2.0000 g | Freq: Once | INTRAVENOUS | Status: AC
  Administered 2017-08-26: 2 g via INTRAVENOUS
  Filled 2017-08-26 (×2): qty 50

## 2017-08-26 MED ORDER — LIDOCAINE HCL (PF) 2 % IJ SOLN
INTRAMUSCULAR | Status: AC
  Administered 2017-08-26: 13:00:00
  Filled 2017-08-26: qty 20

## 2017-08-26 MED ORDER — POTASSIUM CHLORIDE CRYS ER 20 MEQ PO TBCR
40.0000 meq | EXTENDED_RELEASE_TABLET | Freq: Once | ORAL | Status: AC
  Administered 2017-08-26: 40 meq via ORAL
  Filled 2017-08-26: qty 2

## 2017-08-26 NOTE — Progress Notes (Signed)
ID PROGRESS NOTE  Underwent paracentesis, await culture and cell count. Continue on broad spectrum abtx. Vancomycin dose adjustment today since supratherapeutic on vancomycin.  Duke Salvia Drue Second MD MPH [facility] [phone]

## 2017-08-26 NOTE — Progress Notes (Signed)
[facility]  [facility] Infusion Coordinator will follow pt with ID Team to support IV ABX at home at DC as ordered.   If patient discharges after hours, please call [phone].   Rachel Williams 08/26/2017, 9:30 AM

## 2017-08-26 NOTE — Progress Notes (Signed)
CSW continuing to follow for needs.  Verlie Liotta LCSW [phone]  

## 2017-08-26 NOTE — Progress Notes (Signed)
Physical Therapy Treatment Patient Details Name: Rachel Williams MRN: [id number] DOB: [birth year] Today's Date: 08/26/2017    History of Present Illness Pt is a 67 y.o. female admitted 08/15/17 with intra-abdominal MRSA abscess with secondary loculated peritonitis. S/p percutaneous drainage of abdominal abscess and paracentesis on 5/10. Drain removed 5/18. PMHx: tricuspid regurg, CHF, alcohol abuse, tobacco use, pulm HTN, colostomy with reversal, cirrhosis.   PT Comments    Pt progressing with mobility. Today's session focused on repeated sit<>stands and short ambulation distance with use of RW. Pt requiring minA to stand, progressing to min guard with significantly increased time and effort. Easily fatigued with minimal activity. Continue to recommend SNF-level therapies to maximize functional mobility and independence prior to return home. Will plan to progress ambulation next session.  SpO2 down to 84% on RA, returning to 87-94% on 1L O2 [place] HR up to 136 with activity.  Resting BP 92/54 Post-session BP 103/68   Follow Up Recommendations  SNF;Supervision for mobility/OOB     Equipment Recommendations  None recommended by PT    Recommendations for Other Services       Precautions / Restrictions Precautions Precautions: Fall Restrictions Weight Bearing Restrictions: No    Mobility  Bed Mobility               General bed mobility comments: Received sitting in recliner  Transfers Overall transfer level: Needs assistance Equipment used: Rolling walker (2 wheeled) Transfers: Sit to/from Stand Sit to Stand: Min assist;Min guard         General transfer comment: Pt performed multiple sit<>stands during session, requiring seated rest in between; initially able to sit on 3rd attempt with cues for technique and minA for trunk elevation. Stood 2x more with min guard, requiring minA on last trial secondary to fatigue  Ambulation/Gait Ambulation/Gait assistance: Tourist information centre manager (Feet): 10 Feet Assistive device: Rolling walker (2 wheeled) Gait Pattern/deviations: Step-to pattern;Trunk flexed Gait velocity: Decreased Gait velocity interpretation: <1.8 ft/sec, indicate of risk for recurrent falls General Gait Details: Significantly slowed amb forwards/backwards with RW and intermittent minA for balance secondary to bilat knee instability; intermittent cues for sequencing with RW. DOE 2/4; further distance limited secondary to c/o fatigue   Stairs             Wheelchair Mobility    Modified Rankin (Stroke Patients Only)       Balance Overall balance assessment: Needs assistance Sitting-balance support: Feet supported;Bilateral upper extremity supported Sitting balance-Leahy Scale: Fair     Standing balance support: Bilateral upper extremity supported;During functional activity Standing balance-Leahy Scale: Poor Standing balance comment: Reliant on UE support                            Cognition Arousal/Alertness: Awake/alert Behavior During Therapy: WFL for tasks assessed/performed Overall Cognitive Status: Within Functional Limits for tasks assessed                                        Exercises      General Comments General comments (skin integrity, edema, etc.): Daughter present at end of session      Pertinent Vitals/Pain Pain Assessment: No/denies pain    Home Living                      Prior Function  PT Goals (current goals can now be found in the care plan section) Acute Rehab PT Goals Patient Stated Goal: to get stronger PT Goal Formulation: With patient Time For Goal Achievement: 09/02/17 Potential to Achieve Goals: Good Progress towards PT goals: Progressing toward goals    Frequency    Min 2X/week      PT Plan Current plan remains appropriate    Co-evaluation              AM-PAC PT "6 Clicks" Daily Activity  Outcome  Measure  Difficulty turning over in bed (including adjusting bedclothes, sheets and blankets)?: Unable Difficulty moving from lying on back to sitting on the side of the bed? : Unable Difficulty sitting down on and standing up from a chair with arms (e.g., wheelchair, bedside commode, etc,.)?: Unable Help needed moving to and from a bed to chair (including a wheelchair)?: A Little Help needed walking in hospital room?: A Little Help needed climbing 3-5 steps with a railing? : Total 6 Click Score: 10    End of Session Equipment Utilized During Treatment: Gait belt;Oxygen Activity Tolerance: Patient tolerated treatment well Patient left: in chair;with call bell/phone within reach;with chair alarm set;with family/visitor present Nurse Communication: Mobility status PT Visit Diagnosis: Unsteadiness on feet (R26.81);Other abnormalities of gait and mobility (R26.89);Muscle weakness (generalized) (M62.81);Difficulty in walking, not elsewhere classified (R26.2);Adult, failure to thrive (R62.7)     Time: 3244-0102 PT Time Calculation (min) (ACUTE ONLY): 34 min  Charges:  $Therapeutic Exercise: 8-22 mins $Therapeutic Activity: 8-22 mins                    G Codes:      Ina Homes, PT, DPT Acute Rehab Services  Pager: [phone]  Malachy Chamber 08/26/2017, 9:56 AM

## 2017-08-26 NOTE — Care Management Important Message (Signed)
Important Message  Patient Details  Name: Rachel Williams MRN: [id number] Date of Birth: Oct 17, 1950   Medicare Important Message Given:  Yes    Lawerance Sabal, RN 08/26/2017, 9:54 AM

## 2017-08-26 NOTE — Progress Notes (Signed)
ANTIBIOTIC CONSULT NOTE - Follow-Up  Pharmacy Consult for Vancomycin  Indication: intra-abdominal infection  Labs: Recent Labs    08/24/17 0447 08/25/17 0518 08/26/17 0332  WBC 23.1* 25.4* 20.5*  HGB 11.4* 10.4* 9.9*  PLT 346 443* 502*  CREATININE  --   --  0.90   Estimated Creatinine Clearance: 43.5 mL/min (by C-G formula based on SCr of 0.9 mg/dL).  Assessment: 67 yo female presented to ED on 4/10 with abdominal pain and hypotension. IR reported high likelihood of infected ascites c/w bacterial peritonitis and LLQ abscess with thick pus ( Abscess culture growing MRSA). Pharmacy consulted to dose vancomycin.  -WBC= 20.5, afeb, SCr= 0.9 -vancomycin level= 40 (confirmed that last dose of vancomycin was this am at ~ 10am)  The vancomycin dose has been  IV q12h since 5/14 (the last level was 15 on 5/15). SCr has been variable this admit 0.39-1.62.  Antibiotics Ceftriaxone 5/10 > 5/11 Flagyl 5/10 > 5/11 Vancomycin 5/11 >> Meropenem 5/11 >>5/14, 5/19 >>  Cultures 5/10 BCx >> ngtd 5/10 Abscess cx >> abundant MRSA  5/15 peritoneal washings: MRSA 5/20 BCx:  ngtd  Goal of Therapy:  Vancomycin trough level 15-20 mcg/ml  Plan:  -Hold vancomycin  -Check a random vancom in 24hrs  Harland German, PharmD Clinical Pharmacist 08/26/2017 8:35 PM

## 2017-08-26 NOTE — Progress Notes (Signed)
PROGRESS NOTE    Rachel Williams  ZOX:[id number] DOB: 1950/11/17 DOA: 08/15/2017 PCP: Sigmund Hazel, MD   Brief Narrative:  67 year old with a history of chronic diastolic CHF, pulmonary hypertension, active smoker, alcohol abuse, history of bowel perforation status post colectomy, moderate to severe tricuspid regurgitation presented to the hospital with complaints of abdominal pain.  Patient was found to be in septic shock secondary to intra-abdominal abscess versus possible perforated diverticulitis.  CT of the abdomen pelvis showed moderate ascites with 9 cm irregular cystic mass in the left paracolic gutter concerning for abscess.  General surgery was consulted who recommended getting IR guided drain placement which was placed on 5/10.  Due to ascites underwent paracentesis on 5/10 as well.  Cultures ended up growing staph aureus from the drain as well as an narrow therefore patient was started on IV Rocephin and Flagyl and later transitioned to IV meropenem.  Vancomycin was also added.  Case was discussed with Dr. Ninetta Lights from infectious disease who recommended continuing current antibiotics and eventually transitioning to oral Bactrim.  General surgery has been following the patient.  Eventually patient's drain was removed but over the weekend she started becoming slightly hypotensive with low-grade temperature therefore her meropenem was added again.  Infectious disease was consulted who recommended repeating paracentesis.   Assessment & Plan:   Principal Problem:   Intra-abdominal abscess s/p perc drainage 08/15/2017 Active Problems:   Hypokalemia   Smoker   Severe sepsis (HCC)   Chronic diastolic CHF (congestive heart failure) (HCC)   Acute respiratory failure (HCC)   Pleural effusion   Hepatic cirrhosis (HCC)   Protein-calorie malnutrition, severe   Septic shock secondary to intra-abdominal abscess with hypotension this morning - Blood pressure has improved, ordered - Continue patient  on vancomycin and meropenem, IR drain removed 5/18 -Appreciate input from infectious disease -Repeat paracentesis performed today, 700 cc removed.  Cell count culture and fluid analysis is pending.  Acute on chronic diastolic CHF, stable Severe pulmonary hypertension Moderate to severe tricuspid regurgitation and bilateral pleural effusions - Her breathing appears to be stable at this time.  We will continue to keep a close eye out on her volume status. -Echocardiogram from March 2019 shows ejection fraction 60 to 65%, grade 1 diastolic dysfunction  Hypomagnesemia and hypokalemia; improved  -Repletion of potassium and magnesium as necessary.  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  She has quit drinking.  Will need to follow-up with outpatient gastroenterology.  Tobacco use -Nicotine patch as needed  Physical therapy recommends skilled nursing facility. SW aware.   DVT prophylaxis: Subcutaneous heparin Code Status: Full code Family Communication: None at bedside this morning Disposition Plan: Maintain inpatient stay  Consultants:   Interventional radiology  Surgery  Infectious disease   Subjective: Patient was standing up at the side of the bed with the help of her daughter during my evaluation.  She appeared quite deconditioned, her heart rate was up in 130s.  When she was asked to sit down over the course of few minutes her heart rate normalized. She does not any complaints.  Review of Systems Otherwise negative except as per HPI, including: HEENT/EYES = negative for pain, redness, loss of vision, double vision, blurred vision, loss of hearing, sore throat, hoarseness, dysphagia Cardiovascular= negative for chest pain, palpitation, murmurs, lower extremity swelling Respiratory/lungs= negative for shortness of breath, cough, hemoptysis, wheezing, mucus production Gastrointestinal= negative for nausea, vomiting,, abdominal pain, melena,  hematemesis Genitourinary= negative for Dysuria, Hematuria,  Change in Urinary Frequency MSK = Negative for arthralgia, myalgias, Back Pain, Joint swelling  Neurology= Negative for headache, seizures, numbness, tingling  Psychiatry= Negative for anxiety, depression, suicidal and homocidal ideation Allergy/Immunology= Medication/Food allergy as listed  Skin= Negative for Rash, lesions, ulcers, itching    Objective: Vitals:   08/26/17 0500 08/26/17 0722 08/26/17 0900 08/26/17 1100  BP: 108/68  (!) 101/59 103/68  Pulse: 85  93 85  Resp: Temp: 98.7 F (37.1 C) 98.7 F (37.1 C)    TempSrc: Oral Oral    SpO2: 98%  96% 99%  Weight:      Height:        Intake/Output Summary (Last 24 hours) at 08/26/2017 1258 Last data filed at 08/26/2017 1610 Gross per 24 hour  Intake 500 ml  Output -  Net 500 ml   Filed Weights   08/15/17 1009 08/15/17 1846  Weight: 46.7 kg (103 lb) 46.7 kg (103 lb)    Examination: Constitutional: NAD, calm, comfortable, elderly frail-appearing Eyes: PERRL, lids and conjunctivae normal ENMT: Mucous membranes are moist. Posterior pharynx clear of any exudate or lesions.Normal dentition.  Neck: normal, supple, no masses, no thyromegaly Respiratory: Diminished breath sounds at the bases Cardiovascular: Regular rate and rhythm, no murmurs / rubs / gallops. No extremity edema. 2+ pedal pulses. No carotid bruits.  Abdomen: no tenderness, no masses palpated. No hepatosplenomegaly. Bowel sounds positive.  Scar noted in the abdomen, fluid wave shift. Musculoskeletal: no clubbing / cyanosis. No joint deformity upper and lower extremities. Good ROM, no contractures. Normal muscle tone.  Skin: no rashes, lesions, ulcers. No induration Neurologic: CN 2-12 grossly intact. Sensation intact, DTR normal. Strength 4/5 in all 4.  Psychiatric: Normal judgment and insight. Alert and oriented x 3. Normal mood.   Data Reviewed:   CBC: Recent Labs  Lab 08/22/17 0544  08/23/17 0500 08/24/17 0447 08/25/17 0518 08/26/17 0332  WBC 20.1* 20.5* 23.1* 25.4* 20.5*  HGB 13.3 11.1* 11.4* 10.4* 9.9*  HCT 41.4 35.8* 36.3 33.3* 31.6*  MCV 83.3 85.6 87.5 86.3 86.3  PLT 261 300 346 443* 502*   Basic Metabolic Panel: Recent Labs  Lab 08/20/17 0326 08/21/17 0415 08/22/17 0544 08/23/17 0500 08/24/17 0447 08/25/17 0518 08/26/17 0332  NA 139 135 134* 133*  --   --  132*  K 3.8 3.4* 4.3 4.4  --   --  3.6  CL 103 97* 91* 92*  --   --  93*  CO2 25 29 34* 33*  --   --  31  GLUCOSE 92 100* 98 83  --   --  80  BUN 12 5* 5* 9  --   --  9  CREATININE 0.39* 0.39* 0.54 0.82  --   --  0.90  CALCIUM 8.7* 8.1* 8.5* 8.2*  --   --  8.0*  MG 1.0* 0.9* 1.5* 1.7 1.6* 1.4* 1.4*   GFR: Estimated Creatinine Clearance: 43.5 mL/min (by C-G formula based on SCr of 0.9 mg/dL). Liver Function Tests: Recent Labs  Lab 08/21/17 0415 08/22/17 0544 08/23/17 0500  AST ALT 9* 9* 9*  ALKPHOS 136* 141* 120  BILITOT 1.4* 1.3* 1.2  PROT 5.0* 6.1* 5.7*  ALBUMIN 2.0* 2.2* 2.0*   No results for input(s): LIPASE, AMYLASE in the last 168 hours. No results for input(s): AMMONIA in the last 168 hours. Coagulation Profile: No results for input(s): INR, PROTIME in the last 168 hours. Cardiac Enzymes: No results  for input(s): CKTOTAL, CKMB, CKMBINDEX, TROPONINI in the last 168 hours. BNP (last 3 results) No results for input(s): PROBNP in the last 8760 hours. HbA1C: No results for input(s): HGBA1C in the last 72 hours. CBG: Recent Labs  Lab 08/25/17 1651 08/25/17 1958 08/25/17 2347 08/26/17 0320 08/26/17 0823  GLUCAP 88 82 121* 77 89   Lipid Profile: No results for input(s): CHOL, HDL, LDLCALC, TRIG, CHOLHDL, LDLDIRECT in the last 72 hours. Thyroid Function Tests: No results for input(s): TSH, T4TOTAL, FREET4, T3FREE, THYROIDAB in the last 72 hours. Anemia Panel: No results for input(s): VITAMINB12, FOLATE, FERRITIN, TIBC, IRON, RETICCTPCT in the last 72  hours. Sepsis Labs: No results for input(s): PROCALCITON, LATICACIDVEN in the last 168 hours.  Recent Results (from the past 240 hour(s))  Culture, body fluid-bottle     Status: Abnormal (Preliminary result)   Collection Time: 08/20/17 12:41 PM  Result Value Ref Range Status   Specimen Description PERITONEAL  Final   Special Requests NONE  Final   Gram Stain   Final    ANAEROBIC BOTTLE ONLY GRAM POSITIVE COCCI CRITICAL RESULT CALLED TO, READ BACK BY AND VERIFIED WITH: A JETER RN 08/23/17 0612 JDW    Culture (A)  Final    STAPHYLOCOCCUS AUREUS SUSCEPTIBILITIES TO FOLLOW    Report Status PENDING  Incomplete   Organism ID, Bacteria STAPHYLOCOCCUS AUREUS  Final      Susceptibility   Staphylococcus aureus - MIC*    CIPROFLOXACIN >=8 RESISTANT Resistant     ERYTHROMYCIN >=8 RESISTANT Resistant     GENTAMICIN <=0.5 SENSITIVE Sensitive     OXACILLIN >=4 RESISTANT Resistant     TETRACYCLINE <=1 SENSITIVE Sensitive     VANCOMYCIN 1 SENSITIVE Sensitive     TRIMETH/SULFA <=10 SENSITIVE Sensitive     CLINDAMYCIN RESISTANT Resistant     RIFAMPIN <=0.5 SENSITIVE Sensitive     Inducible Clindamycin Value in next row Resistant      POSITIVEPerformed at [facility], [address], [place], [place] [address]    * STAPHYLOCOCCUS AUREUS  Gram stain     Status: None   Collection Time: 08/20/17 12:41 PM  Result Value Ref Range Status   Specimen Description PERITONEAL  Final   Special Requests NONE  Final   Gram Stain   Final    FEW WBC PRESENT, PREDOMINANTLY PMN NO ORGANISMS SEEN Performed at [facility], [address], [place], [place] [address]    Report Status 08/20/2017 FINAL  Final         Radiology Studies: No results found.      Scheduled Meds: . benzonatate  100 mg Oral BID  . enoxaparin (LOVENOX) injection  30 mg Subcutaneous Q24H  . feeding supplement  1 Container Oral TID BM  . guaiFENesin  600 mg Oral BID  . nicotine  14 mg Transdermal Daily  .  sodium chloride flush  5 mL Intracatheter Q8H   Continuous Infusions: . magnesium sulfate 1 - 4 g bolus IVPB    . meropenem (MERREM) IV Stopped (08/26/17 0130)  . methocarbamol (ROBAXIN)  IV    . vancomycin Stopped (08/26/17 1122)     LOS: 11 days    I have spent 25 minutes face to face with the patient and on the ward discussing the patients care, assessment, plan and disposition with other care givers. >50% of the time was devoted counseling the patient about the risks and benefits of treatment and coordinating care.  Ankit Joline Maxcy, MD [facility] Pager [phone]   If 7PM-7AM, please contact night-coverage [email or web address] Password [facility] 08/26/2017, 12:58 PM

## 2017-08-26 NOTE — Procedures (Signed)
PROCEDURE SUMMARY:  Successful US guided paracentesis from left lateral abdomen.  Yielded 700 mL of greenish yellow fluid.  No immediate complications.  Patient tolerated well.   Specimen was sent for labs.  Bijan Ridgley S Graycen Sadlon PA-C 08/26/2017 12:09 PM

## 2017-08-26 NOTE — Progress Notes (Signed)
Pt transported to IR for paracentesis.  Central monitoring called to continue monitoring while on portable monitor.

## 2017-08-27 DIAGNOSIS — K7031 Alcoholic cirrhosis of liver with ascites: Secondary | ICD-10-CM

## 2017-08-27 LAB — VANCOMYCIN, RANDOM: Vancomycin Rm: 28

## 2017-08-27 LAB — CBC
HEMATOCRIT: 32.8 % — AB (ref 36.0–46.0)
HEMOGLOBIN: 10.3 g/dL — AB (ref 12.0–15.0)
MCH: 27.3 pg (ref 26.0–34.0)
MCHC: 31.4 g/dL (ref 30.0–36.0)
MCV: 87 fL (ref 78.0–100.0)
Platelets: 529 10*3/uL — ABNORMAL HIGH (ref 150–400)
RBC: 3.77 MIL/uL — ABNORMAL LOW (ref 3.87–5.11)
RDW: 15.4 % (ref 11.5–15.5)
WBC: 12.5 10*3/uL — ABNORMAL HIGH (ref 4.0–10.5)

## 2017-08-27 LAB — BASIC METABOLIC PANEL
ANION GAP: 7 (ref 5–15)
BUN: 11 mg/dL (ref 6–20)
CHLORIDE: 96 mmol/L — AB (ref 101–111)
CO2: 30 mmol/L (ref 22–32)
Calcium: 8.3 mg/dL — ABNORMAL LOW (ref 8.9–10.3)
Creatinine, Ser: 0.86 mg/dL (ref 0.44–1.00)
GFR calc Af Amer: >60 mL/min (ref >60)
GFR calc non Af Amer: >60 mL/min (ref >60)
GLUCOSE: 75 mg/dL (ref 65–99)
POTASSIUM: 4.4 mmol/L (ref 3.5–5.1)
Sodium: 133 mmol/L — ABNORMAL LOW (ref 135–145)

## 2017-08-27 LAB — GLUCOSE, CAPILLARY
GLUCOSE-CAPILLARY: 128 mg/dL — AB (ref 65–99)
GLUCOSE-CAPILLARY: 68 mg/dL (ref 65–99)
GLUCOSE-CAPILLARY: 75 mg/dL (ref 65–99)
GLUCOSE-CAPILLARY: 83 mg/dL (ref 65–99)
Glucose-Capillary: 87 mg/dL (ref 65–99)
Glucose-Capillary: 95 mg/dL (ref 65–99)
Glucose-Capillary: 173 mg/dL — ABNORMAL HIGH (ref 65–99)

## 2017-08-27 LAB — PATHOLOGIST SMEAR REVIEW

## 2017-08-27 LAB — MAGNESIUM: Magnesium: 1.9 mg/dL (ref 1.7–2.4)

## 2017-08-27 MED ORDER — FUROSEMIDE 40 MG PO TABS
40.0000 mg | ORAL_TABLET | Freq: Every day | ORAL | Status: DC
  Administered 2017-08-27 – 2017-08-29 (×3): 40 mg via ORAL
  Filled 2017-08-27 (×3): qty 1

## 2017-08-27 MED ORDER — VANCOMYCIN HCL 500 MG IV SOLR
500.0000 mg | INTRAVENOUS | Status: DC
  Administered 2017-08-28: 500 mg via INTRAVENOUS
  Filled 2017-08-27: qty 500

## 2017-08-27 MED ORDER — SPIRONOLACTONE 12.5 MG HALF TABLET
12.5000 mg | ORAL_TABLET | Freq: Every day | ORAL | Status: DC
  Administered 2017-08-27 – 2017-08-29 (×3): 12.5 mg via ORAL
  Filled 2017-08-27 (×3): qty 1

## 2017-08-27 MED ORDER — MAGNESIUM SULFATE 2 GM/50ML IV SOLN
2.0000 g | Freq: Once | INTRAVENOUS | Status: AC
  Administered 2017-08-27: 2 g via INTRAVENOUS
  Filled 2017-08-27: qty 50

## 2017-08-27 MED ORDER — FOLIC ACID 1 MG PO TABS
1.0000 mg | ORAL_TABLET | Freq: Every day | ORAL | Status: DC
  Administered 2017-08-27 – 2017-08-29 (×3): 1 mg via ORAL
  Filled 2017-08-27 (×3): qty 1

## 2017-08-27 NOTE — Progress Notes (Signed)
Purewick removed from patient.  Patient encouraged to get oob to bathroom or BSC to increase stamina as well as assist with deep breathing. Pt unhappy with removal but understands the reasoning.  Pt assisted to bathroom with walker x1 person assist w/o SOB or dyspnea noted. Pt agrees to sit in chair for at least one hour.  Pt also encouraged to take deep breaths and use incentive spirometer.  Pt seen using IS while in the chair.

## 2017-08-27 NOTE — Progress Notes (Signed)
Hypoglycemic Event  CBG: 68  Treatment: 15 GM carbohydrate snack  Symptoms: None  Follow-up CBG: Time:1:00am CBG Result: 87  Possible Reasons for Event: Inadequate meal intake  Comments/MD notified: Schorr, NP    Rachel Williams

## 2017-08-27 NOTE — Progress Notes (Signed)
ID PROGRESS NOTE  24hr event - had paracentesis, where patient feels improvement in abdominal discomfort, has been afebrile  Labs show improvement in leukocytosis, peritoneal fluid still has neutrophilic predominance + wbc slightly decreased. Awaiting cx data  - continue on current abtx regimen. Await culture results tomorrow, will likely narrow abtx tomorrow.

## 2017-08-27 NOTE — Progress Notes (Signed)
PROGRESS NOTE  Rachel Williams ZOX:[id number] DOB: [birth year] DOA: 08/15/2017 PCP: Sigmund Hazel, MD  HPI/Recap of past 59 hours: 67 year old with a history of chronic diastolic CHF, pulmonary hypertension, active smoker, alcohol abuse, history of bowel perforation status post colectomy, moderate to severe tricuspid regurgitation presented to the hospital with complaints of abdominal pain.  Patient was found to be in septic shock secondary to intra-abdominal abscess versus possible perforated diverticulitis.  CT of the abdomen pelvis showed moderate ascites with 9 cm irregular cystic mass in the left paracolic gutter concerning for abscess.  General surgery was consulted who recommended getting IR guided drain placement which was placed on 5/10.  Due to ascites underwent paracentesis on 5/10 as well.  Cultures ended up growing staph aureus from the drain as well as an narrow therefore patient was started on IV Rocephin and Flagyl and later transitioned to IV meropenem.  Vancomycin was also added.  Case was discussed with Dr. Ninetta Lights from infectious disease who recommended continuing current antibiotics and eventually transitioning to oral Bactrim.  General surgery has been following the patient.  Eventually patient's drain was removed but over the weekend she started becoming slightly hypotensive with low-grade temperature therefore her meropenem was added again.  Infectious disease was consulted who recommended repeating paracentesis.  08/27/2017: Patient seen and examined at her bedside.  She denies any abdominal pain or nausea.  Assessment/Plan: Principal Problem:   Intra-abdominal abscess s/p perc drainage 08/15/2017 Active Problems:   Hypokalemia   Smoker   Severe sepsis (HCC)   Chronic diastolic CHF (congestive heart failure) (HCC)   Acute respiratory failure (HCC)   Pleural effusion   Hepatic cirrhosis (HCC)   Protein-calorie malnutrition, severe  Septic shock secondary to intra-abdominal  abscess with hypotension Improving Maintaining a map above 65 Leukocytosis trending down Afebrile Continue meropenem and IV vancomycin Infectious disease following Repeat CBC in the morning  Alcoholic cirrhosis Self-reported has not followed with GI Will need to follow-up with GI outpatient Resume spironolactone and Lasix Monitor blood pressure closely  Ascites in the setting of cirrhosis Post paracentesis on 08/26/2017 700 cc of greenish-yellow fluid removed by interventional radiology Awaiting results of the culture and cell count Continue broad-spectrum IV antibiotics  Hypomagnesemia Magnesium 1.9 Repleted with IV magnesium 2 g once Repeat BMP in the morning  Hypokalemia, resolved  Debility/ambulatory dysfunction PT recommends SNF Fall precautions Continue physical therapy CSW consulted to assist in placement     Code Status: Full code  Family Communication: None at bedside  Disposition Plan: SNF when clinically stable   Consultants:  Infectious disease  Procedures:  Paracentesis 08/26/2017  Antimicrobials:  IV vancomycin  IV meropenem  DVT prophylaxis: Subcu Lovenox  Objective: Vitals:   08/27/17 0407 08/27/17 0621 08/27/17 0748 08/27/17 0749  BP: 137/76  102/61   Pulse: 88  79   Resp: 15  14   Temp:  98.7 F (37.1 C)  98.4 F (36.9 C)  TempSrc:  Oral  Oral  SpO2: 98%  99%   Weight:      Height:        Intake/Output Summary (Last 24 hours) at 08/27/2017 1143 Last data filed at 08/27/2017 1033 Gross per 24 hour  Intake 560 ml  Output 1700 ml  Net -1140 ml   Filed Weights   08/15/17 1009 08/15/17 1846  Weight: 46.7 kg (103 lb) 46.7 kg (103 lb)    Exam:  . General: 67 y.o. year-old female well developed well nourished in no acute  distress.  Alert and oriented x3. . Cardiovascular: Regular rate and rhythm with no rubs or gallops.  No thyromegaly or JVD noted.   Marland Kitchen Respiratory: Clear to auscultation with no wheezes or rales. Good  inspiratory effort. . Abdomen: Soft nontender nondistended with normal bowel sounds x4 quadrants. . Musculoskeletal: No lower extremity edema. 2/4 pulses in all 4 extremities. . Skin: No ulcerative lesions noted or rashes, . Psychiatry: Mood is appropriate for condition and setting   Data Reviewed: CBC: Recent Labs  Lab 08/23/17 0500 08/24/17 0447 08/25/17 0518 08/26/17 0332 08/27/17 0426  WBC 20.5* 23.1* 25.4* 20.5* 12.5*  HGB 11.1* 11.4* 10.4* 9.9* 10.3*  HCT 35.8* 36.3 33.3* 31.6* 32.8*  MCV 85.6 87.5 86.3 86.3 87.0  PLT 300 346 443* 502* 529*   Basic Metabolic Panel: Recent Labs  Lab 08/21/17 0415 08/22/17 0544 08/23/17 0500 08/24/17 0447 08/25/17 0518 08/26/17 0332 08/27/17 0426  NA 135 134* 133*  --   --  132* 133*  K 3.4* 4.3 4.4  --   --  3.6 4.4  CL 97* 91* 92*  --   --  93* 96*  CO2 29 34* 33*  --   --  31 30  GLUCOSE 100* 98 83  --   --  80 75  BUN 5* 5* 9  --   --  9 11  CREATININE 0.39* 0.54 0.82  --   --  0.90 0.86  CALCIUM 8.1* 8.5* 8.2*  --   --  8.0* 8.3*  MG 0.9* 1.5* 1.7 1.6* 1.4* 1.4* 1.9   GFR: Estimated Creatinine Clearance: 45.5 mL/min (by C-G formula based on SCr of 0.86 mg/dL). Liver Function Tests: Recent Labs  Lab 08/21/17 0415 08/22/17 0544 08/23/17 0500  AST ALT 9* 9* 9*  ALKPHOS 136* 141* 120  BILITOT 1.4* 1.3* 1.2  PROT 5.0* 6.1* 5.7*  ALBUMIN 2.0* 2.2* 2.0*   No results for input(s): LIPASE, AMYLASE in the last 168 hours. No results for input(s): AMMONIA in the last 168 hours. Coagulation Profile: No results for input(s): INR, PROTIME in the last 168 hours. Cardiac Enzymes: No results for input(s): CKTOTAL, CKMB, CKMBINDEX, TROPONINI in the last 168 hours. BNP (last 3 results) No results for input(s): PROBNP in the last 8760 hours. HbA1C: No results for input(s): HGBA1C in the last 72 hours. CBG: Recent Labs  Lab 08/26/17 2137 08/27/17 0022 08/27/17 0100 08/27/17 0324 08/27/17 0747  GLUCAP 79 68 87 75  83   Lipid Profile: No results for input(s): CHOL, HDL, LDLCALC, TRIG, CHOLHDL, LDLDIRECT in the last 72 hours. Thyroid Function Tests: No results for input(s): TSH, T4TOTAL, FREET4, T3FREE, THYROIDAB in the last 72 hours. Anemia Panel: No results for input(s): VITAMINB12, FOLATE, FERRITIN, TIBC, IRON, RETICCTPCT in the last 72 hours. Urine analysis:    Component Value Date/Time   COLORURINE YELLOW 08/25/2017 1010   APPEARANCEUR HAZY (A) 08/25/2017 1010   LABSPEC 1.014 08/25/2017 1010   PHURINE 5.0 08/25/2017 1010   GLUCOSEU NEGATIVE 08/25/2017 1010   HGBUR SMALL (A) 08/25/2017 1010   BILIRUBINUR NEGATIVE 08/25/2017 1010   KETONESUR NEGATIVE 08/25/2017 1010   PROTEINUR NEGATIVE 08/25/2017 1010   UROBILINOGEN 1.0 01/11/2010 1611   NITRITE NEGATIVE 08/25/2017 1010   LEUKOCYTESUR NEGATIVE 08/25/2017 1010   Sepsis Labs: (procalcitonin:4,lacticidven:4)  ) Recent Results (from the past 240 hour(s))  Culture, body fluid-bottle     Status: Abnormal   Collection Time: 08/20/17 12:41 PM  Result Value Ref Range Status  Specimen Description PERITONEAL  Final   Special Requests   Final    NONE Performed at [facility], [address] [place], [place] [address]    Gram Stain   Final    ANAEROBIC BOTTLE ONLY GRAM POSITIVE COCCI CRITICAL RESULT CALLED TO, READ BACK BY AND VERIFIED WITH: A JETER RN 08/23/17 0612 JDW    Culture METHICILLIN RESISTANT STAPHYLOCOCCUS AUREUS (A)  Final   Report Status 08/26/2017 FINAL  Final   Organism ID, Bacteria METHICILLIN RESISTANT STAPHYLOCOCCUS AUREUS  Final      Susceptibility   Methicillin resistant staphylococcus aureus - MIC*    CIPROFLOXACIN >=8 RESISTANT Resistant     ERYTHROMYCIN >=8 RESISTANT Resistant     GENTAMICIN <=0.5 SENSITIVE Sensitive     OXACILLIN >=4 RESISTANT Resistant     TETRACYCLINE <=1 SENSITIVE Sensitive     VANCOMYCIN 1 SENSITIVE Sensitive     TRIMETH/SULFA <=10 SENSITIVE Sensitive     CLINDAMYCIN  RESISTANT Resistant     RIFAMPIN <=0.5 SENSITIVE Sensitive     Inducible Clindamycin POSITIVE Resistant     * METHICILLIN RESISTANT STAPHYLOCOCCUS AUREUS  Gram stain     Status: None   Collection Time: 08/20/17 12:41 PM  Result Value Ref Range Status   Specimen Description PERITONEAL  Final   Special Requests NONE  Final   Gram Stain   Final    FEW WBC PRESENT, PREDOMINANTLY PMN NO ORGANISMS SEEN Performed at [facility], [address] [place], [place] [address]    Report Status 08/20/2017 FINAL  Final  Culture, blood (Routine X 2) w Reflex to ID Panel     Status: None (Preliminary result)   Collection Time: 08/25/17 11:20 AM  Result Value Ref Range Status   Specimen Description BLOOD RIGHT ANTECUBITAL  Final   Special Requests   Final    BOTTLES DRAWN AEROBIC ONLY Blood Culture adequate volume   Culture   Final    NO GROWTH 1 DAY Performed at [facility], [address], [place], [place] [address]    Report Status PENDING  Incomplete  Culture, blood (Routine X 2) w Reflex to ID Panel     Status: None (Preliminary result)   Collection Time: 08/25/17 11:25 AM  Result Value Ref Range Status   Specimen Description BLOOD RIGHT HAND  Final   Special Requests   Final    BOTTLES DRAWN AEROBIC ONLY Blood Culture results may not be optimal due to an inadequate volume of blood received in culture bottles   Culture   Final    NO GROWTH 1 DAY Performed at [facility], [address] [place], [place] [address]    Report Status PENDING  Incomplete  Gram stain     Status: None   Collection Time: 08/26/17 12:05 PM  Result Value Ref Range Status   Specimen Description PERITONEAL FLUID  Final   Special Requests NONE  Final   Gram Stain   Final    WBC PRESENT,BOTH PMN AND MONONUCLEAR GRAM POSITIVE COCCI CYTOSPIN SMEAR Performed at [facility], [address] [place], [place] [address]    Report Status 08/26/2017 FINAL  Final      Studies: Ir  Paracentesis  Result Date: 08/26/2017 INDICATION: History of intra-abdominal abscess, cirrhosis, loculated ascites. Request for diagnostic and therapeutic paracentesis. EXAM: ULTRASOUND GUIDED PARACENTESIS MEDICATIONS: 1% Lidocaine = 8 mL COMPLICATIONS: None immediate. PROCEDURE: Informed written consent was obtained from the patient after a  discussion of the risks, benefits and alternatives to treatment. A timeout was performed prior to the initiation of the procedure. Initial ultrasound scanning demonstrates a moderate amount of loculated ascites within the left lower abdominal quadrant. The left lower abdomen was prepped and draped in the usual sterile fashion. 1% lidocaine with epinephrine was used for local anesthesia. Following this, a 19 gauge, 7-cm, Yueh catheter was introduced. An ultrasound image was saved for documentation purposes. The paracentesis was performed. The catheter was removed and a dressing was applied. The patient tolerated the procedure well without immediate post procedural complication. FINDINGS: A total of approximately 700 mL of greenish yellow fluid was removed. Samples were sent to the laboratory as requested by the clinical team. IMPRESSION: Successful ultrasound-guided paracentesis yielding 700 mL of peritoneal fluid. Read by: Corrin Parker, PA-C Electronically Signed   By: Corlis Leak M.D.   On: 08/26/2017 12:09    Scheduled Meds: . benzonatate  100 mg Oral BID  . enoxaparin (LOVENOX) injection  30 mg Subcutaneous Q24H  . feeding supplement  1 Container Oral TID BM  . guaiFENesin  600 mg Oral BID  . nicotine  14 mg Transdermal Daily  . sodium chloride flush  5 mL Intracatheter Q8H    Continuous Infusions: . meropenem (MERREM) IV Stopped (08/27/17 1033)  . methocarbamol (ROBAXIN)  IV       LOS: 12 days     Darlin Drop, MD [facility] Pager [phone]  If 7PM-7AM, please contact night-coverage [email or web address] Password [facility] 08/27/2017, 11:43 AM

## 2017-08-27 NOTE — Progress Notes (Signed)
ANTIBIOTIC CONSULT NOTE - Follow-Up  Pharmacy Consult for Vancomycin  Indication: intra-abdominal infection  Labs: Recent Labs    08/25/17 0518 08/26/17 0332 08/27/17 0426  WBC 25.4* 20.5* 12.5*  HGB 10.4* 9.9* 10.3*  PLT 443* 502* 529*  CREATININE  --  0.90 0.86   Estimated Creatinine Clearance: 45.5 mL/min (by C-G formula based on SCr of 0.86 mg/dL).  Assessment: 67 yo female presented to ED on 4/10 with abdominal pain and hypotension. IR reported high likelihood of infected ascites c/w bacterial peritonitis and LLQ abscess with thick pus (abscess culture growing MRSA). Pharmacy consulted to dose vancomycin.   -WBC improving to 12.5, afeb, SCr= 0.9 (up from 0.4 baseline)  5/21 vancomycin level= 40  5/22 vancomycin level = 28  Patient specific ke = 0.0149, t1/2 =46hrs.  Estimate level will be ~ 20 on 5/23 PM.  Noted intial plans for IV abx for two weeks (through 5/24).  Antibiotics Ceftriaxone 5/10 > 5/11 Flagyl 5/10 > 5/11 Vancomycin 5/11 >> Meropenem 5/11 >>5/14, 5/19 >>  Cultures 5/10 BCx >> ngtd 5/10 Abscess cx >> abundant MRSA  5/15 peritoneal washings: MRSA 5/20 BCx:  ngtd  Goal of Therapy:  Vancomycin trough level 15-20 mcg/ml  Plan:  Schedule Vancomycin  IV q48h - nest dose 5/23 at 1800.  Anticipate level will be <20 at this time based in patient specific kinetics. Follow-up stop date of 5/24 - confirm with ID.  [employer], Pharm.D., BCPS Clinical Pharmacist 08/27/2017 9:31 PM

## 2017-08-28 DIAGNOSIS — K659 Peritonitis, unspecified: Secondary | ICD-10-CM

## 2017-08-28 DIAGNOSIS — Z792 Long term (current) use of antibiotics: Secondary | ICD-10-CM

## 2017-08-28 DIAGNOSIS — R109 Unspecified abdominal pain: Secondary | ICD-10-CM

## 2017-08-28 DIAGNOSIS — R1030 Lower abdominal pain, unspecified: Secondary | ICD-10-CM

## 2017-08-28 DIAGNOSIS — A4902 Methicillin resistant Staphylococcus aureus infection, unspecified site: Secondary | ICD-10-CM

## 2017-08-28 DIAGNOSIS — R188 Other ascites: Secondary | ICD-10-CM

## 2017-08-28 LAB — BASIC METABOLIC PANEL
Anion gap: 8 (ref 5–15)
BUN: 9 mg/dL (ref 6–20)
CALCIUM: 8.2 mg/dL — AB (ref 8.9–10.3)
CO2: 33 mmol/L — ABNORMAL HIGH (ref 22–32)
CREATININE: 0.82 mg/dL (ref 0.44–1.00)
Chloride: 94 mmol/L — ABNORMAL LOW (ref 101–111)
GFR calc non Af Amer: >60 mL/min (ref >60)
Glucose, Bld: 112 mg/dL — ABNORMAL HIGH (ref 65–99)
Potassium: 3.9 mmol/L (ref 3.5–5.1)
SODIUM: 135 mmol/L (ref 135–145)

## 2017-08-28 LAB — CBC
HCT: 30.7 % — ABNORMAL LOW (ref 36.0–46.0)
Hemoglobin: 10 g/dL — ABNORMAL LOW (ref 12.0–15.0)
MCH: 27.6 pg (ref 26.0–34.0)
MCHC: 32.6 g/dL (ref 30.0–36.0)
MCV: 84.8 fL (ref 78.0–100.0)
PLATELETS: 581 10*3/uL — AB (ref 150–400)
RBC: 3.62 MIL/uL — AB (ref 3.87–5.11)
RDW: 15.4 % (ref 11.5–15.5)
WBC: 12.5 10*3/uL — AB (ref 4.0–10.5)

## 2017-08-28 LAB — GLUCOSE, CAPILLARY
GLUCOSE-CAPILLARY: 106 mg/dL — AB (ref 65–99)
Glucose-Capillary: 115 mg/dL — ABNORMAL HIGH (ref 65–99)
Glucose-Capillary: 140 mg/dL — ABNORMAL HIGH (ref 65–99)
Glucose-Capillary: 81 mg/dL (ref 65–99)
Glucose-Capillary: 95 mg/dL (ref 65–99)

## 2017-08-28 LAB — MAGNESIUM: MAGNESIUM: 1.9 mg/dL (ref 1.7–2.4)

## 2017-08-28 NOTE — Progress Notes (Addendum)
CSW spoke with patient again regarding discharge plan. Patient continuing to request discharging back to ALF with home health therapy.   CSW continuing to follow to see if patient requires long term antibiotics.   Rachel Casco Zavier Canela LCSW [phone]

## 2017-08-28 NOTE — Progress Notes (Signed)
Nutrition Follow-up  DOCUMENTATION CODES:   Severe malnutrition in context of chronic illness  INTERVENTION:  Snacks ordered  D/C Magic Cup, patient does not like  D/C Boost Breeze, patient does not like  NUTRITION DIAGNOSIS:   Severe Malnutrition related to chronic illness as evidenced by severe fat depletion, severe muscle depletion, percent weight loss. -ongoing  GOAL:   Patient will meet greater than or equal to 90% of their needs -unmet  MONITOR:   PO intake  REASON FOR ASSESSMENT:   Malnutrition Screening Tool    ASSESSMENT:   67 yo female with PMH CHF, Pulmonary HTN, Smoker, ETOH abuse, history bowel perf s/p colectomy, presents with abd pain, septic shock secondary to intra-abdominal abscess vs possible perf diverticulitis. Gen surgery recommended IR guided drain placement, placed 5/10, also underwent paracentesis 5/10 due to ascites.  Now on IV ABx due to rising WBC, low grade temp, and hypotension. Drain removed 5/18.  Followed up with Ms. Leavy Cella today, she did not like boost breeze or magic cup, discontinued both.  Had rice krispies cereal this morning for breakfast. No other needs at this time. Encourage PO intake. On IV ABx for abscess per ID.  Labs reviewed: CBGs 115, 81, 106, 95  Medications reviewed and include:  Folic acid  Diet Order:   Diet Order           Diet Heart Room service appropriate? Yes; Fluid consistency: Thin; Fluid restriction: 1800 mL Fluid  Diet effective now          EDUCATION NEEDS:   Not appropriate for education at this time  Skin:  Skin Assessment: Reviewed RN Assessment(MASD to abdomen)  Last BM:  08/27/2017  Height:   Ht Readings from Last 1 Encounters:  08/15/17  (1.575 m)    Weight:   Wt Readings from Last 1 Encounters:  08/15/17 103 lb (46.7 kg)    Ideal Body Weight:  50 kg  BMI:  Body mass index is 18.84 kg/m.  Estimated Nutritional Needs:   Kcal:  4782-9562 calories  Protein:  70-80  grams (1.5-1.7g/kg)  Fluid:  1.4-1.7L  Dionne Ano. Channelle Bottger, MS, RD LDN Inpatient Clinical Dietitian Pager [phone]

## 2017-08-28 NOTE — Progress Notes (Signed)
[facility]    Date of Admission:  08/15/2017   Total days of antibiotics 14        Day 12 vancomycin        Day 5 meropenem           ID: Rachel Williams is a 67 y.o. female with  Principal Problem:   Intra-abdominal abscess s/p perc drainage 08/15/2017 Active Problems:   Hypokalemia   Smoker   Severe sepsis (HCC)   Chronic diastolic CHF (congestive heart failure) (HCC)   Acute respiratory failure (HCC)   Pleural effusion   Hepatic cirrhosis (HCC)   Protein-calorie malnutrition, severe    Subjective: Afebrile, denies abdominal pain  ROS: 12 point ros is negative, other than weakness, deconditioned  Medications:  . benzonatate  100 mg Oral BID  . enoxaparin (LOVENOX) injection  30 mg Subcutaneous Q24H  . folic acid  1 mg Oral Daily  . furosemide  40 mg Oral Daily  . guaiFENesin  600 mg Oral BID  . nicotine  14 mg Transdermal Daily  . sodium chloride flush  5 mL Intracatheter Q8H  . spironolactone  12.5 mg Oral Daily    Objective: Vital signs in last 24 hours: Temp:  [97.6 F (36.4 C)-99.3 F (37.4 C)] 98.3 F (36.8 C) (05/23 0800) Pulse Rate:  [84-92] 92 (05/22 1634) Resp:  [17] 17 (05/22 1634) BP: (106-107)/(60-62) 106/60 (05/22 1634) SpO2:  [97 %-99 %] 97 % (05/22 1634) Physical Exam  Constitutional:  oriented to person, place, and time. appears stated age and frail, under-nourished. No distress.  HENT: [place]/AT, PERRLA, no scleral icterus Mouth/Throat: Oropharynx is clear and moist. No oropharyngeal exudate.  Cardiovascular: Normal rate, regular rhythm and normal heart sounds. Exam reveals no gallop and no friction rub.  No murmur heard.  Pulmonary/Chest: Effort normal and breath sounds normal. No respiratory distress.  has no wheezes.  Neck = supple, no nuchal rigidity Abdominal: Soft. Bowel sounds are normal.  mild distension. There is no tenderness.  Lymphadenopathy: no cervical adenopathy. No axillary adenopathy Neurological:  alert and oriented to person, place, and time.  Skin: Skin is warm and dry. No rash noted. No erythema.  Psychiatric: a normal mood and affect.  behavior is normal.   Lab Results Recent Labs    08/27/17 0426 08/28/17 0257  WBC 12.5* 12.5*  HGB 10.3* 10.0*  HCT 32.8* 30.7*  NA 133* 135  K 4.4 3.9  CL 96* 94*  CO2 30 33*  BUN 11 9  CREATININE 0.86 0.82    Microbiology: 5/10 peritoneal fluid -MRSA S bactrim, S rif, S tetra 5/15 peritoneal fluid-MRSA 5/21 peritoenal fluid -NGTD at 48hr but gram stain with GPC Studies/Results: Ir Paracentesis  Result Date: 08/26/2017 INDICATION: History of intra-abdominal abscess, cirrhosis, loculated ascites. Request for diagnostic and therapeutic paracentesis. EXAM: ULTRASOUND GUIDED PARACENTESIS MEDICATIONS: 1% Lidocaine = 8 mL COMPLICATIONS: None immediate. PROCEDURE: Informed written consent was obtained from the patient after a discussion of the risks, benefits and alternatives to treatment. A timeout was performed prior to the initiation of the procedure. Initial ultrasound scanning demonstrates a moderate amount of loculated ascites within the left lower abdominal quadrant. The left lower abdomen was prepped and draped in the usual sterile fashion. 1% lidocaine with epinephrine was used for local anesthesia. Following this, a 19 gauge, 7-cm, Yueh catheter was introduced. An ultrasound image was saved for documentation purposes. The paracentesis was performed. The catheter was removed and a dressing was  applied. The patient tolerated the procedure well without immediate post procedural complication. FINDINGS: A total of approximately 700 mL of greenish yellow fluid was removed. Samples were sent to the laboratory as requested by the clinical team. IMPRESSION: Successful ultrasound-guided paracentesis yielding 700 mL of peritoneal fluid. Read by: Corrin Parker, PA-C Electronically Signed   By: Corlis Leak M.D.   On: 08/26/2017 12:09     Assessment/Plan: MRSA intra-abdominal/peritoneal abscess with secondary bacterial peritonitis. Had repeat paracentesis on 3/21 that removed cell count still showing neutrophilic predominance but count is less than 5 days prior. No growth on repeat fluids. Patient has not been bacteremic  - plan to d/c meropenem and continue on monotherapy with vancomycin - plan to get picc line to do IV abtx as outpatient - treat for additional 8 days once she is discharge - with goal of treating for 3 wk including what she has received during this hospitalization - at that time we will see her back in the ID clinic to switch to orals - she will need repeat abd CT as outpatient in roughly 8-10d to see if fluid collection has decreased in size.(last imaging on 5/15 showed it to be 6 cm x 2 cm)  Therapeutic drug monitoring - she will need twice a week labs due to risk of aki with vancomycin. She was supratherapeutic on 5/21. Pharmacy is adjusting dosage. Will place opat order  Will sign off.  [facility] Cell: [phone] Pager: [phone]  08/28/2017, 11:13 AM

## 2017-08-28 NOTE — [place] FL2 (Signed)
MEDICAID FL2 LEVEL OF CARE SCREENING TOOL     IDENTIFICATION  Patient Name: Rachel Williams Birthdate: November 27, 1950 Sex: female Admission Date (Current Location): 08/15/2017  [facility] and [place] Number:  Producer, television/film/video and Address:  [facility], [address] [place], [place] [address]      Provider Number: [id number]  Attending Physician Name and Address:  Darlin Drop, DO  Relative Name and Phone Number:  Vernona Rieger daughter, [phone]    Current Level of Care: Hospital Recommended Level of Care: Skilled Nursing Facility Prior Approval Number:    Date Approved/Denied:   PASRR Number: [id number]  Discharge Plan: SNF    Current Diagnoses: Patient Active Problem List   Diagnosis Date Noted  . Abdominal pain   . Ascites   . MRSA infection   . Bacterial peritonitis (HCC)   . Protein-calorie malnutrition, severe 08/26/2017  . Hepatic cirrhosis (HCC) 08/19/2017  . Intra-abdominal abscess s/p perc drainage 08/15/2017 08/19/2017  . Acute respiratory failure (HCC)   . Pleural effusion   . Severe sepsis (HCC) 08/15/2017  . Chronic diastolic CHF (congestive heart failure) (HCC) 08/15/2017  . Pulmonary hypertension (HCC) 06/26/2017  . CHF (congestive heart failure) (HCC) 06/25/2017  . Acute CHF (congestive heart failure) (HCC) 06/24/2017  . Right middle lobe pulmonary infiltrate 06/24/2017  . Sepsis, unspecified organism (HCC) 05/21/2016  . Elevated LFTs 05/21/2016  . Macrocytic anemia 05/21/2016  . Influenza A 05/21/2016  . Malnutrition of moderate degree 05/29/2015  . NSVT (nonsustained ventricular tachycardia) (HCC) 05/29/2015  . Hyponatremia 05/27/2015  . Recurrent falls 05/27/2015  . Facial bruising 05/27/2015  . Hypomagnesemia 05/27/2015  . Hypokalemia 05/27/2015  . Alcohol use (HCC), daily 2-3 drinks of vodka 05/27/2015  . Smoker 05/27/2015  . Dehydration 05/27/2015  . Vertebral compression fracture (HCC) 05/27/2015  .  Acute pharyngitis 05/27/2015  . Chronic constipation 05/27/2015  . Generalized weakness     Orientation RESPIRATION BLADDER Height & Weight     Self, Time, Situation, Place  O2(Nasal cannula 1.5L) Continent, External catheter Weight: 46.7 kg (103 lb) Height:   (157.5 cm)  BEHAVIORAL SYMPTOMS/MOOD NEUROLOGICAL BOWEL NUTRITION STATUS      Continent Diet(Please see DC Summary)  AMBULATORY STATUS COMMUNICATION OF NEEDS Skin   Extensive Assist Verbally Normal                       Personal Care Assistance Level of Assistance  Bathing, Feeding, Dressing Bathing Assistance: Maximum assistance Feeding assistance: Independent Dressing Assistance: Limited assistance     Functional Limitations Info  Sight, Hearing, Speech Sight Info: Impaired Hearing Info: Adequate Speech Info: Adequate    SPECIAL CARE FACTORS FREQUENCY  PT (By licensed PT), OT (By licensed OT)     PT Frequency: 5x/week OT Frequency: 3x/week            Contractures      Additional Factors Info  Code Status, Allergies, Isolation Precautions Code Status Info: Full Allergies Info: Ciprofloxacin, Levofloxacin, Nickel, Penicillins     Isolation Precautions Info: MRSA in the nose treated with Bactroban cream     Current Medications (08/28/2017):  This is the current hospital active medication list Current Facility-Administered Medications  Medication Dose Route Frequency Provider Last Rate Last Dose  . acetaminophen (TYLENOL) tablet 650 mg  650 mg Oral Q6H PRN Calvert Cantor, MD   650 mg at 08/24/17 0530   Or  . acetaminophen (TYLENOL) suppository 650 mg  650  mg Rectal Q6H PRN Calvert Cantor, MD      . benzonatate (TESSALON) capsule 100 mg  100 mg Oral BID Rolly Salter, MD   100 mg at 08/28/17 1010  . enoxaparin (LOVENOX) injection 30 mg  30 mg Subcutaneous Q24H Rolly Salter, MD   30 mg at 08/27/17 2108  . folic acid (FOLVITE) tablet 1 mg  1 mg Oral Daily Avera, Carole N, DO   1 mg at 08/28/17  1009  . furosemide (LASIX) tablet 40 mg  40 mg Oral Daily Dow Adolph N, DO   40 mg at 08/28/17 1008  . guaiFENesin (MUCINEX) 12 hr tablet 600 mg  600 mg Oral BID Rolly Salter, MD   600 mg at 08/28/17 1010  . guaiFENesin-dextromethorphan (ROBITUSSIN DM) 100-10 MG/5ML syrup 5 mL  5 mL Oral Q4H PRN Bodenheimer, Charles A, NP   5 mL at 08/18/17 2115  . HYDROmorphone (DILAUDID) injection 0.5-1 mg  0.5-1 mg Intravenous Q2H PRN Karie Soda, MD      . lidocaine (PF) (XYLOCAINE) 1 % injection    PRN Gershon Crane, PA-C   10 mL at 08/26/17 1149  . lidocaine (XYLOCAINE) 2 % injection   Infiltration PRN Allred, Darrell K, PA-C   10 mL at 08/20/17 1226  . methocarbamol (ROBAXIN) 1,000 mg in dextrose 5 % 50 mL IVPB  1,000 mg Intravenous Q6H PRN Karie Soda, MD      . methocarbamol (ROBAXIN) tablet 1,000 mg  1,000 mg Oral Q6H PRN Karie Soda, MD      . nicotine (NICODERM CQ - dosed in mg/24 hours) patch 14 mg  14 mg Transdermal Daily Calvert Cantor, MD   14 mg at 08/28/17 1011  . ondansetron (ZOFRAN) tablet 4 mg  4 mg Oral Q6H PRN Calvert Cantor, MD       Or  . ondansetron (ZOFRAN) injection 4 mg  4 mg Intravenous Q6H PRN Calvert Cantor, MD   4 mg at 08/23/17 1634  . oxyCODONE (Oxy IR/ROXICODONE) immediate release tablet 5-10 mg  5-10 mg Oral Q4H PRN Karie Soda, MD   10 mg at 08/28/17 0144  . sodium chloride flush (NS) 0.9 % injection 5 mL  5 mL Intracatheter Q8H Malachy Moan, MD   5 mL at 08/27/17 2109  . spironolactone (ALDACTONE) tablet 12.5 mg  12.5 mg Oral Daily Dow Adolph N, DO   12.5 mg at 08/28/17 1009  . vancomycin (VANCOCIN) 500 mg in sodium chloride 0.9 % 100 mL IVPB  500 mg Intravenous Q48H Hammons, Gerhard Munch, RPH         Discharge Medications: Please see discharge summary for a list of discharge medications.  Relevant Imaging Results:  Relevant Lab Results:   Additional Information SS#: [id number]  Mearl Latin, LCSWA

## 2017-08-28 NOTE — Progress Notes (Signed)
PROGRESS NOTE  Rachel Williams UJW:[id number] DOB: 12-Dec-1950 DOA: 08/15/2017 PCP: Sigmund Hazel, MD  HPI/Recap of past 54 hours: 67 year old with a history of chronic diastolic CHF, pulmonary hypertension, active smoker, alcohol abuse, history of bowel perforation status post colectomy, moderate to severe tricuspid regurgitation presented to the hospital with complaints of abdominal pain.  Patient was found to be in septic shock secondary to intra-abdominal abscess versus possible perforated diverticulitis.  CT of the abdomen pelvis showed moderate ascites with 9 cm irregular cystic mass in the left paracolic gutter concerning for abscess.  General surgery was consulted who recommended getting IR guided drain placement which was placed on 5/10.  Due to ascites underwent paracentesis on 5/10 as well.  Cultures ended up growing staph aureus from the drain as well as an narrow therefore patient was started on IV Rocephin and Flagyl and later transitioned to IV meropenem.  Vancomycin was also added.  Case was discussed with Dr. Ninetta Lights from infectious disease who recommended continuing current antibiotics and eventually transitioning to oral Bactrim.  General surgery has been following the patient.  Eventually patient's drain was removed but over the weekend she started becoming slightly hypotensive with low-grade temperature therefore her meropenem was added again.  Infectious disease was consulted who recommended repeating paracentesis.  08/27/2017: Patient seen and examined at her bedside.  She denies any abdominal pain or nausea.  08/28/17: NO new complaints.  Assessment/Plan: Principal Problem:   Intra-abdominal abscess s/p perc drainage 08/15/2017 Active Problems:   Hypokalemia   Smoker   Severe sepsis (HCC)   Chronic diastolic CHF (congestive heart failure) (HCC)   Acute respiratory failure (HCC)   Pleural effusion   Hepatic cirrhosis (HCC)   Protein-calorie malnutrition, severe   Abdominal  pain   Ascites   MRSA infection   Bacterial peritonitis (HCC)  Septic shock secondary to intra-abdominal abscess with hypotension, resolving Maintaining a map above 65 Leukocytosis trending down Afebrile Stop meropenem and continue IV vancomycin Appreciate ID recommendations  MRSA intraabdominal abscess complicated by bacterial peritonitis Management as stated above Obtain PICC line C/w IV van for 8 days.  Alcoholic cirrhosis, stable Self-reported has not followed with GI Will need to follow-up with GI outpatient Resume spironolactone and Lasix Monitor blood pressure closely  Ascites in etting of cirrhosis Post paracentesis on 08/26/2017 700 cc of greenish-yellow fluid removed by interventional radiology C/w IV vanc  Hypomagnesemia, repleted  Hypokalemia, repleted  Debility/ambulatory dysfunction PT recommends SNF Fall precautions Continue physical therapy CSW consulted to assist in placement     Code Status: Full code  Family Communication: None at bedside  Disposition Plan: SNF when clinically stable   Consultants:  Infectious disease  Procedures:  Paracentesis 08/26/2017  Antimicrobials:  IV vancomycin  IV meropenem  DVT prophylaxis: Subcu Lovenox  Objective: Vitals:   08/28/17 0835 08/28/17 1234 08/28/17 1600 08/28/17 1634  BP: 135/70 106/62  96/66  Pulse: 77 82  (!) 123  Resp: Temp: 98.1 F (36.7 C) 98.2 F (36.8 C) 98.3 F (36.8 C) 98.5 F (36.9 C)  TempSrc: Oral Oral Oral   SpO2: 100% 98%  95%  Weight:      Height:        Intake/Output Summary (Last 24 hours) at 08/28/2017 2032 Last data filed at 08/28/2017 1841 Gross per 24 hour  Intake 545 ml  Output 2900 ml  Net -2355 ml   Filed Weights   08/15/17 1009 08/15/17 1846  Weight: 46.7 kg (103  lb) 46.7 kg (103 lb)    Exam:  . General: 67 y.o. year-old female WD WN NAD A&O x 3. . Cardiovascular: RRR no rubs or gallops. NO JVD or thyromegaly. Marland Kitchen Respiratory:  Clear to auscultation with no wheezes or rales. Good inspiratory effort. . Abdomen: Soft nontender nondistended with normal bowel sounds x4 quadrants. . Musculoskeletal: No lower extremity edema. 2/4 pulses in all 4 extremities. . Skin: No ulcerative lesions noted or rashes, . Psychiatry: Mood is appropriate for condition and setting   Data Reviewed: CBC: Recent Labs  Lab 08/24/17 0447 08/25/17 0518 08/26/17 0332 08/27/17 0426 08/28/17 0257  WBC 23.1* 25.4* 20.5* 12.5* 12.5*  HGB 11.4* 10.4* 9.9* 10.3* 10.0*  HCT 36.3 33.3* 31.6* 32.8* 30.7*  MCV 87.5 86.3 86.3 87.0 84.8  PLT 346 443* 502* 529* 581*   Basic Metabolic Panel: Recent Labs  Lab 08/22/17 0544 08/23/17 0500 08/24/17 0447 08/25/17 0518 08/26/17 0332 08/27/17 0426 08/28/17 0257  NA 134* 133*  --   --  132* 133* 135  K 4.3 4.4  --   --  3.6 4.4 3.9  CL 91* 92*  --   --  93* 96* 94*  CO2 34* 33*  --   --  31 30 33*  GLUCOSE 98 83  --   --  80 75 112*  BUN 5* 9  --   --  CREATININE 0.54 0.82  --   --  0.90 0.86 0.82  CALCIUM 8.5* 8.2*  --   --  8.0* 8.3* 8.2*  MG 1.5* 1.7 1.6* 1.4* 1.4* 1.9 1.9   GFR: Estimated Creatinine Clearance: 47.7 mL/min (by C-G formula based on SCr of 0.82 mg/dL). Liver Function Tests: Recent Labs  Lab 08/22/17 0544 08/23/17 0500  AST 30 27  ALT 9* 9*  ALKPHOS 141* 120  BILITOT 1.3* 1.2  PROT 6.1* 5.7*  ALBUMIN 2.2* 2.0*   No results for input(s): LIPASE, AMYLASE in the last 168 hours. No results for input(s): AMMONIA in the last 168 hours. Coagulation Profile: No results for input(s): INR, PROTIME in the last 168 hours. Cardiac Enzymes: No results for input(s): CKTOTAL, CKMB, CKMBINDEX, TROPONINI in the last 168 hours. BNP (last 3 results) No results for input(s): PROBNP in the last 8760 hours. HbA1C: No results for input(s): HGBA1C in the last 72 hours. CBG: Recent Labs  Lab 08/28/17 0019 08/28/17 0441 08/28/17 0752 08/28/17 1240 08/28/17 1754  GLUCAP  95 106* 81 115* 140*   Lipid Profile: No results for input(s): CHOL, HDL, LDLCALC, TRIG, CHOLHDL, LDLDIRECT in the last 72 hours. Thyroid Function Tests: No results for input(s): TSH, T4TOTAL, FREET4, T3FREE, THYROIDAB in the last 72 hours. Anemia Panel: No results for input(s): VITAMINB12, FOLATE, FERRITIN, TIBC, IRON, RETICCTPCT in the last 72 hours. Urine analysis:    Component Value Date/Time   COLORURINE YELLOW 08/25/2017 1010   APPEARANCEUR HAZY (A) 08/25/2017 1010   LABSPEC 1.014 08/25/2017 1010   PHURINE 5.0 08/25/2017 1010   GLUCOSEU NEGATIVE 08/25/2017 1010   HGBUR SMALL (A) 08/25/2017 1010   BILIRUBINUR NEGATIVE 08/25/2017 1010   KETONESUR NEGATIVE 08/25/2017 1010   PROTEINUR NEGATIVE 08/25/2017 1010   UROBILINOGEN 1.0 01/11/2010 1611   NITRITE NEGATIVE 08/25/2017 1010   LEUKOCYTESUR NEGATIVE 08/25/2017 1010   Sepsis Labs: (procalcitonin:4,lacticidven:4)  ) Recent Results (from the past 240 hour(s))  Culture, body fluid-bottle     Status: Abnormal   Collection Time: 08/20/17 12:41 PM  Result Value Ref Range Status  Specimen Description PERITONEAL  Final   Special Requests   Final    NONE Performed at [facility], [address] [place], [place] [address]    Gram Stain   Final    ANAEROBIC BOTTLE ONLY GRAM POSITIVE COCCI CRITICAL RESULT CALLED TO, READ BACK BY AND VERIFIED WITH: A JETER RN 08/23/17 0612 JDW    Culture METHICILLIN RESISTANT STAPHYLOCOCCUS AUREUS (A)  Final   Report Status 08/26/2017 FINAL  Final   Organism ID, Bacteria METHICILLIN RESISTANT STAPHYLOCOCCUS AUREUS  Final      Susceptibility   Methicillin resistant staphylococcus aureus - MIC*    CIPROFLOXACIN >=8 RESISTANT Resistant     ERYTHROMYCIN >=8 RESISTANT Resistant     GENTAMICIN <=0.5 SENSITIVE Sensitive     OXACILLIN >=4 RESISTANT Resistant     TETRACYCLINE <=1 SENSITIVE Sensitive     VANCOMYCIN 1 SENSITIVE Sensitive     TRIMETH/SULFA <=10 SENSITIVE Sensitive      CLINDAMYCIN RESISTANT Resistant     RIFAMPIN <=0.5 SENSITIVE Sensitive     Inducible Clindamycin POSITIVE Resistant     * METHICILLIN RESISTANT STAPHYLOCOCCUS AUREUS  Gram stain     Status: None   Collection Time: 08/20/17 12:41 PM  Result Value Ref Range Status   Specimen Description PERITONEAL  Final   Special Requests NONE  Final   Gram Stain   Final    FEW WBC PRESENT, PREDOMINANTLY PMN NO ORGANISMS SEEN Performed at [facility], [address], [place], [place] [address]    Report Status 08/20/2017 FINAL  Final  Culture, blood (Routine X 2) w Reflex to ID Panel     Status: None (Preliminary result)   Collection Time: 08/25/17 11:20 AM  Result Value Ref Range Status   Specimen Description BLOOD RIGHT ANTECUBITAL  Final   Special Requests   Final    BOTTLES DRAWN AEROBIC ONLY Blood Culture adequate volume   Culture   Final    NO GROWTH 3 DAYS Performed at [facility], [address], [place], [place] [address]    Report Status PENDING  Incomplete  Culture, blood (Routine X 2) w Reflex to ID Panel     Status: None (Preliminary result)   Collection Time: 08/25/17 11:25 AM  Result Value Ref Range Status   Specimen Description BLOOD RIGHT HAND  Final   Special Requests   Final    BOTTLES DRAWN AEROBIC ONLY Blood Culture results may not be optimal due to an inadequate volume of blood received in culture bottles   Culture   Final    NO GROWTH 3 DAYS Performed at [facility], [address], [place], [place] [address]    Report Status PENDING  Incomplete  Gram stain     Status: None   Collection Time: 08/26/17 12:05 PM  Result Value Ref Range Status   Specimen Description PERITONEAL FLUID  Final   Special Requests NONE  Final   Gram Stain   Final    WBC PRESENT,BOTH PMN AND MONONUCLEAR GRAM POSITIVE COCCI CYTOSPIN SMEAR Performed at [facility], [address] [place], [place] [address]    Report Status 08/26/2017 FINAL  Final  Culture, body  fluid-bottle     Status: None (Preliminary result)   Collection Time: 08/26/17 12:06 PM  Result Value Ref Range Status   Specimen Description PERITONEAL FLUID  Final   Special Requests NONE  Final   Culture   Final    NO  GROWTH 2 DAYS Performed at [facility], [address] [place], [place] [address]    Report Status PENDING  Incomplete      Studies: No results found.  Scheduled Meds: . benzonatate  100 mg Oral BID  . enoxaparin (LOVENOX) injection  30 mg Subcutaneous Q24H  . folic acid  1 mg Oral Daily  . furosemide  40 mg Oral Daily  . guaiFENesin  600 mg Oral BID  . nicotine  14 mg Transdermal Daily  . sodium chloride flush  5 mL Intracatheter Q8H  . spironolactone  12.5 mg Oral Daily    Continuous Infusions: . methocarbamol (ROBAXIN)  IV    . vancomycin Stopped (08/28/17 1848)     LOS: 13 days     Darlin Drop, MD [facility] Pager [phone]  If 7PM-7AM, please contact night-coverage [email or web address] Password [facility] 08/28/2017, 8:32 PM

## 2017-08-28 NOTE — Progress Notes (Signed)
PHARMACY CONSULT NOTE FOR:  OUTPATIENT  PARENTERAL ANTIBIOTIC THERAPY (OPAT)  Indication: MRSA intra-abdominal/peritoneal abscess  Regimen: Vancomycin 500 mg every 48 hours  End date: May 31st, 2019  IV antibiotic discharge orders are pended. To discharging provider:  please sign these orders via discharge navigator,  Select New Orders & click on the button choice - Manage This Unsigned Work.     Thank you for allowing pharmacy to be a part of this patient's care.  Sharin Mons, PharmD, BCPS PGY2 Infectious Diseases Pharmacy Resident Pager: [phone]  08/28/2017, 3:51 PM

## 2017-08-28 NOTE — Progress Notes (Addendum)
11am-Due to requesting a private room, daughter has chosen [employer]. They are reviewing referral.    10:45am-CSW spoke with patient and daughter. They are now requesting rehab. CSW to search for bed.  Osborne Casco Gilliam Hawkes LCSW [phone]

## 2017-08-29 ENCOUNTER — Inpatient Hospital Stay: Payer: Self-pay

## 2017-08-29 LAB — CBC
HCT: 31.7 % — ABNORMAL LOW (ref 36.0–46.0)
HEMOGLOBIN: 9.9 g/dL — AB (ref 12.0–15.0)
MCH: 26.5 pg (ref 26.0–34.0)
MCHC: 31.2 g/dL (ref 30.0–36.0)
MCV: 85 fL (ref 78.0–100.0)
Platelets: 531 10*3/uL — ABNORMAL HIGH (ref 150–400)
RBC: 3.73 MIL/uL — ABNORMAL LOW (ref 3.87–5.11)
RDW: 15.4 % (ref 11.5–15.5)
WBC: 11.2 10*3/uL — ABNORMAL HIGH (ref 4.0–10.5)

## 2017-08-29 LAB — BASIC METABOLIC PANEL
Anion gap: 10 (ref 5–15)
BUN: 10 mg/dL (ref 6–20)
CALCIUM: 8.4 mg/dL — AB (ref 8.9–10.3)
CHLORIDE: 92 mmol/L — AB (ref 101–111)
CO2: 35 mmol/L — AB (ref 22–32)
CREATININE: 0.81 mg/dL (ref 0.44–1.00)
GFR calc Af Amer: >60 mL/min (ref >60)
GFR calc non Af Amer: >60 mL/min (ref >60)
GLUCOSE: 87 mg/dL (ref 65–99)
Potassium: 3.7 mmol/L (ref 3.5–5.1)
Sodium: 137 mmol/L (ref 135–145)

## 2017-08-29 LAB — GLUCOSE, CAPILLARY
Glucose-Capillary: 76 mg/dL (ref 65–99)
Glucose-Capillary: 108 mg/dL — ABNORMAL HIGH (ref 65–99)
Glucose-Capillary: 112 mg/dL — ABNORMAL HIGH (ref 65–99)

## 2017-08-29 LAB — MAGNESIUM: MAGNESIUM: 1.4 mg/dL — AB (ref 1.7–2.4)

## 2017-08-29 MED ORDER — VANCOMYCIN IV (FOR PTA / DISCHARGE USE ONLY): 500.0000 mg | INTRAVENOUS | 0 refills | Status: AC

## 2017-08-29 MED ORDER — SODIUM CHLORIDE 0.9% FLUSH: 10.0000 mL | INTRAVENOUS | Status: DC | PRN

## 2017-08-29 MED ORDER — HEPARIN SOD (PORK) LOCK FLUSH 100 UNIT/ML IV SOLN
250.0000 [IU] | INTRAVENOUS | Status: AC | PRN
  Administered 2017-08-29: 250 [IU]

## 2017-08-29 MED ORDER — NICOTINE 14 MG/24HR TD PT24: 14.0000 mg | MEDICATED_PATCH | Freq: Every day | TRANSDERMAL | 0 refills | Status: AC

## 2017-08-29 NOTE — Progress Notes (Signed)
Attempted to give report to RN at [facility].

## 2017-08-29 NOTE — Discharge Instructions (Signed)
Abdominal Pain, Adult Many things can cause belly (abdominal) pain. Most times, belly pain is not dangerous. Many cases of belly pain can be watched and treated at home. Sometimes belly pain is serious, though. Your doctor will try to find the cause of your belly pain. Follow these instructions at home:  Take over-the-counter and prescription medicines only as told by your doctor. Do not take medicines that help you poop (laxatives) unless told to by your doctor.  Drink enough fluid to keep your pee (urine) clear or pale yellow.  Watch your belly pain for any changes.  Keep all follow-up visits as told by your doctor. This is important. Contact a doctor if:  Your belly pain changes or gets worse.  You are not hungry, or you lose weight without trying.  You are having trouble pooping (constipated) or have watery poop (diarrhea) for more than 2-3 days.  You have pain when you pee or poop.  Your belly pain wakes you up at night.  Your pain gets worse with meals, after eating, or with certain foods.  You are throwing up and cannot keep anything down.  You have a fever. Get help right away if:  Your pain does not go away as soon as your doctor says it should.  You cannot stop throwing up.  Your pain is only in areas of your belly, such as the right side or the left lower part of the belly.  You have bloody or black poop, or poop that looks like tar.  You have very bad pain, cramping, or bloating in your belly.  You have signs of not having enough fluid or water in your body (dehydration), such as: ? Dark pee, very little pee, or no pee. ? Cracked lips. ? Dry mouth. ? Sunken eyes. ? Sleepiness. ? Weakness. This information is not intended to replace advice given to you by your health care provider. Make sure you discuss any questions you have with your health care provider. Document Released: 09/11/2007 Document Revised: 10/13/2015 Document Reviewed: 09/06/2015 Elsevier  Interactive Patient Education  2018 [employer]   Percutaneous Abscess Drain, Care After This sheet gives you information about how to care for yourself after your procedure. Your health care provider may also give you more specific instructions. If you have problems or questions, contact your health care provider. What can I expect after the procedure? After your procedure, it is common to have:  A small amount of bruising and discomfort in the area where the drainage tube (catheter) was placed.  Sleepiness and fatigue. This should go away after the medicines you were given have worn off.  Follow these instructions at home: Incision care  Follow instructions from your health care provider about how to take care of your incision. Make sure you: ? Wash your hands with soap and water before you change your bandage (dressing). If soap and water are not available, use hand sanitizer. ? Change your dressing as told by your health care provider. ? Leave stitches (sutures), skin glue, or adhesive strips in place. These skin closures may need to stay in place for 2 weeks or longer. If adhesive strip edges start to loosen and curl up, you may trim the loose edges. Do not remove adhesive strips completely unless your health care provider tells you to do that.  Check your incision area every day for signs of infection. Check for: ? More redness, swelling, or pain. ? More fluid or blood. ? Warmth. ? Pus or  a bad smell. ? Fluid leaking from around your catheter (instead of fluid draining through your catheter). Catheter care  Follow instructions from your health care provider about emptying and cleaning your catheter and collection bag. You may need to clean the catheter every day so it does not clog.  If directed, write down the following information every time you empty your bag: ? The date and time. ? The amount of drainage. General instructions  Rest at home for 1-2 days after your  procedure. Return to your normal activities as told by your health care provider.  Do not take baths, swim, or use a hot tub for 24 hours after your procedure, or until your health care provider says that this is okay.  Take over-the-counter and prescription medicines only as told by your health care provider.  Keep all follow-up visits as told by your health care provider. This is important. Contact a health care provider if:  You have less than 10 mL of drainage a day for 2-3 days in a row, or as directed by your health care provider.  You have more redness, swelling, or pain around your incision area.  You have more fluid or blood coming from your incision area.  Your incision area feels warm to the touch.  You have pus or a bad smell coming from your incision area.  You have fluid leaking from around your catheter (instead of through your catheter).  You have a fever or chills.  You have pain that does not get better with medicine. Get help right away if:  Your catheter comes out.  You suddenly stop having drainage from your catheter.  You suddenly have blood in the fluid that is draining from your catheter.  You become dizzy or you faint.  You develop a rash.  You have nausea or vomiting.  You have difficulty breathing or you feel short of breath.  You develop chest pain.  You have problems with your speech or vision.  You have trouble balancing or moving your arms or legs. Summary  It is common to have a small amount of bruising and discomfort in the area where the drainage tube (catheter) was placed.  You may be directed to record the amount of drainage from the bag every time you empty it.  Follow instructions from your health care provider about emptying and cleaning your catheter and collection bag. This information is not intended to replace advice given to you by your health care provider. Make sure you discuss any questions you have with your health  care provider. Document Released: 08/09/2013 Document Revised: 02/15/2016 Document Reviewed: 02/15/2016 Elsevier Interactive Patient Education  2017 [employer].  Peritonitis Peritonitis is inflammation of the peritoneum. The peritoneum is the tissue that lines the abdomen and covers the internal organs. Certain conditions or injuries can cause organs to leak stool, bacteria, fungi, or chemicals, such as bile or other digestive fluids, into the abdomen. When these substances come into contact with the peritoneum, they may cause irritation or infection. Peritonitis can be a life-threatening infection if not treated promptly. The infection can progress to involve the whole body (sepsis). What are the causes? Many conditions can cause peritonitis, including:  Appendicitis.  Pancreatitis.  Diverticulitis.  Ulcers.  Crohn disease or ulcerative colitis.  Cancer.  Liver disease.  Tuberculosis.  Other possible causes are:  Injury, such as: ? Abdominal injury. ? Injury to the stomach or esophagus.  Other infections inside the abdomen or pelvis.  Peritoneal  dialysis. This is a procedure used to cleanse the blood when the kidneys are unable to do so.  What are the signs or symptoms?  Severe abdominal pain.  Hard-feeling abdomen.  Abdominal swelling.  Fever and chills.  Nausea and vomiting.  Poor appetite or no appetite.  Inability to pass gas or stool.  Diarrhea.  Less frequent urination. How is this diagnosed? Your health care provider will perform a physical exam and take your medical history. Other tests that are done may include:  Blood tests.  Urinalysis.  Stool analysis, if you have associated diarrhea.  Paracentesis.  X-ray.  Ultrasound.  CT scans.  How is this treated? Treatment for peritonitis requires treating the symptoms and also the underlying cause of peritonitis. Treatment may include:  Antibiotic medicines.  Surgery to remove infected  fluid and tissue.  Surgery to treat conditions that cause peritonitis, such as an appendectomy for appendicitis.  Follow these instructions at home:  Take medicines only as directed by your health care provider.  If you were prescribed an antibiotic medicine, finish it all even if you start to feel better.  If you were taking prescription medicines for other problems before you developed peritonitis, ask about when you should re-start these medicines.  Follow your health care provider's instructions for diet and activity.  Drink enough fluid to keep your urine clear or pale yellow.  If directed, use a stool softener or laxative.  Rest as much as possible. Contact a health care provider if: You have a fever. Get help right away if:  You have severe abdominal pain or tenderness.  You have abdominal swelling.  You start to have the chills.  You have new problems with passing urine.  You develop chest pains or shortness of breath.  You have nausea, vomiting, or diarrhea.  You are unable to pass gas or stool. This information is not intended to replace advice given to you by your health care provider. Make sure you discuss any questions you have with your health care provider. Document Released: 03/07/2008 Document Revised: 08/31/2015 Document Reviewed: 11/05/2013 Elsevier Interactive Patient Education  2018 [employer].

## 2017-08-29 NOTE — Progress Notes (Signed)
Peripherally Inserted Central Catheter/Midline Placement  The IV Nurse has discussed with the patient and/or persons authorized to consent for the patient, the purpose of this procedure and the potential benefits and risks involved with this procedure.  The benefits include less needle sticks, lab draws from the catheter, and the patient may be discharged home with the catheter. Risks include, but not limited to, infection, bleeding, blood clot (thrombus formation), and puncture of an artery; nerve damage and irregular heartbeat and possibility to perform a PICC exchange if needed/ordered by physician.  Alternatives to this procedure were also discussed.  B[employer] PICC patient education guide, fact sheet on infection prevention and patient information card has been provided to patient /or left at bedside.    PICC/Midline Placement Documentation        Rachel Williams 08/29/2017, 12:59 PM

## 2017-08-29 NOTE — Progress Notes (Signed)
Physical Therapy Treatment Patient Details Name: Rachel Williams MRN: [id number] DOB: 18-May-1950 Today's Date: 08/29/2017    History of Present Illness Pt is a 67 y.o. female admitted 08/15/17 with intra-abdominal MRSA abscess with secondary loculated peritonitis. S/p percutaneous drainage of abdominal abscess and paracentesis on 5/10. Drain removed 5/18. PMHx: tricuspid regurg, CHF, alcohol abuse, tobacco use, pulm HTN, colostomy with reversal, cirrhosis.   PT Comments    Pt progressing with mobility. Amb 73' with rollator and min guard for balance; increased time and effort for all mobility due to decreased activity tolerance and fatigue. HR 90s at rest, up to 140s with ambulation. SpO2 88-98% on RA; 1L O2 [place] replaced due to SpO2 88% once supine. Continue to recommend SNF-level therapies. Pt very motivated to participate.   Follow Up Recommendations  SNF;Supervision for mobility/OOB     Equipment Recommendations  None recommended by PT    Recommendations for Other Services       Precautions / Restrictions Precautions Precautions: Fall Restrictions Weight Bearing Restrictions: No    Mobility  Bed Mobility Overal bed mobility: Needs Assistance Bed Mobility: Sit to Supine       Sit to supine: Supervision      Transfers Overall transfer level: Needs assistance Equipment used: 4-wheeled walker Transfers: Sit to/from Stand Sit to Stand: Min guard         General transfer comment: Stood 2x from chair and recliner with rollator and min guard for balance; cues to lock rollator brakes  Ambulation/Gait Ambulation/Gait assistance: Min guard Ambulation Distance (Feet): 40 Feet Assistive device: 4-wheeled walker Gait Pattern/deviations: Step-through pattern;Decreased stride length;Trunk flexed Gait velocity: Decreased Gait velocity interpretation: <1.8 ft/sec, indicate of risk for recurrent falls General Gait Details: Slow, controlled amb with rollator and min guard for  balance; 1x seated rest break at 15' secondary to fatigue and HR up to 140s   Stairs             Wheelchair Mobility    Modified Rankin (Stroke Patients Only)       Balance Overall balance assessment: Needs assistance Sitting-balance support: Feet supported;Bilateral upper extremity supported Sitting balance-Leahy Scale: Good     Standing balance support: Bilateral upper extremity supported;During functional activity Standing balance-Leahy Scale: Poor Standing balance comment: Reliant on UE support                            Cognition Arousal/Alertness: Awake/alert Behavior During Therapy: WFL for tasks assessed/performed Overall Cognitive Status: Within Functional Limits for tasks assessed                                        Exercises      General Comments        Pertinent Vitals/Pain Pain Assessment: No/denies pain    Home Living                      Prior Function            PT Goals (current goals can now be found in the care plan section) Acute Rehab PT Goals Patient Stated Goal: to get stronger PT Goal Formulation: With patient Time For Goal Achievement: 09/02/17 Potential to Achieve Goals: Good Progress towards PT goals: Progressing toward goals    Frequency    Min 2X/week      PT Plan Current  plan remains appropriate    Co-evaluation              AM-PAC PT "6 Clicks" Daily Activity  Outcome Measure  Difficulty turning over in bed (including adjusting bedclothes, sheets and blankets)?: A Little Difficulty moving from lying on back to sitting on the side of the bed? : A Little Difficulty sitting down on and standing up from a chair with arms (e.g., wheelchair, bedside commode, etc,.)?: Unable Help needed moving to and from a bed to chair (including a wheelchair)?: A Little Help needed walking in hospital room?: A Little Help needed climbing 3-5 steps with a railing? : A Lot 6 Click  Score: 15    End of Session Equipment Utilized During Treatment: Gait belt Activity Tolerance: Patient tolerated treatment well Patient left: with call bell/phone within reach;in bed Nurse Communication: Mobility status PT Visit Diagnosis: Unsteadiness on feet (R26.81);Other abnormalities of gait and mobility (R26.89);Muscle weakness (generalized) (M62.81);Difficulty in walking, not elsewhere classified (R26.2);Adult, failure to thrive (R62.7)     Time: 1117-1140 PT Time Calculation (min) (ACUTE ONLY): 23 min  Charges:  $Gait Training: 8-22 mins $Therapeutic Activity: 8-22 mins                    G Codes:      Ina Homes, PT, DPT Acute Rehab Services  Pager: [phone]  Malachy Chamber 08/29/2017, 12:13 PM

## 2017-08-29 NOTE — Progress Notes (Signed)
Rachel Williams to be D/C'd Skilled nursing facility per MD order.  Discussed with the patient and all questions fully answered.  VSS, Skin clean, dry and intact without evidence of skin break down, no evidence of skin tears noted. IV catheter discontinued intact. Site without signs and symptoms of complications. Dressing and pressure applied.  An After Visit Summary was printed and given to the patient. Patient received prescription.  D/c education completed with patient/family including follow up instructions, medication list, d/c activities limitations if indicated, with other d/c instructions as indicated by MD - patient able to verbalize understanding, all questions fully answered.   Patient instructed to return to ED, call 911, or call MD for any changes in condition.   Patient escorted via WC, and D/C [facility] via private auto.  Marca Ancona 08/29/2017 5:29 PM

## 2017-08-29 NOTE — Progress Notes (Signed)
MD notified of Vancomycin trough needing to be moved to Monday morning since [facility] not having Lab techs working on the weekend.

## 2017-08-29 NOTE — Progress Notes (Signed)
[facility] has received insurance authorization.  Osborne Casco Allayna Erlich LCSW [phone]

## 2017-08-29 NOTE — Discharge Summary (Signed)
Discharge Summary  Rachel Williams PIR:[id number] DOB: 12/18/1950  PCP: Kathyrn Lass, MD  Admit date: 08/15/2017 Discharge date: 08/29/2017  Time spent: 25 minutes  Recommendations for Outpatient Follow-up:  1. Follow-up with infectious disease 2. Follow-up with PCP 3. Take your medications as prescribed  Recommendations from infectious disease: -IV vancomycin: Treat for additional 8 days once she is discharge - with goal of treating for 3 wk including what she has received during this hospitalization - at that time we will see her back in the ID clinic to switch to orals - she will need repeat abd CT as outpatient in roughly 8-10d to see if fluid collection has decreased in size.(last imaging on 5/15 showed it to be 6 cm x 2 cm)  Therapeutic drug monitoring - she will need twice a week labs due to risk of aki with vancomycin. She was supratherapeutic on 5/21. Pharmacy is adjusting dosage. Will place opat order      Discharge Diagnoses:  Active Hospital Problems   Diagnosis Date Noted  . Intra-abdominal abscess s/p perc drainage 08/15/2017 08/19/2017  . Abdominal pain   . Ascites   . MRSA infection   . Bacterial peritonitis ([facility])   . Protein-calorie malnutrition, severe 08/26/2017  . Hepatic cirrhosis ([facility]) 08/19/2017  . Acute respiratory failure ([place])   . Pleural effusion   . Severe sepsis ([place]) 08/15/2017  . Chronic diastolic CHF (congestive heart failure) ([place]) 08/15/2017  . Hypokalemia 05/27/2015  . Smoker 05/27/2015    Resolved Hospital Problems   Diagnosis Date Noted Date Resolved  . 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 Patient was found to be in septic shock secondary to intra-abdominal abscess versus possible perforated diverticulitis. CT of the abdomen pelvis showed moderate ascites with 9 cm irregular cystic mass in the left paracolic gutter concerning for abscess. General surgery was consulted who recommended getting IR guided drain placement which was placed on 5/10. Due to ascites underwent paracentesis on 5/10 as well. Cultures ended up growing staph aureus from the drain as well as an narrow therefore patient was started on IV Rocephin and Flagyl and later transitioned to IV meropenem. Vancomycin was also added. Case was discussed with Dr. Johnnye Sima from infectious disease who recommended continuing current antibiotics and eventually transitioning to oral Bactrim. General surgery has been following the patient.Eventually patient's drain was removed but over the weekend she started becoming slightly hypotensive with low-grade temperature therefore her meropenem was added again. Infectious disease was consulted who recommended repeating paracentesis.  08/29/2017: Patient seen and examined at bedside.  She has no new complaints.  On the day of discharge patient was hemodynamically stable she will need to follow-up with infectious disease, PCP post hospitalization.  Hospital Course:  Principal Problem:   Intra-abdominal abscess s/p perc drainage 08/15/2017 Active Problems:   Hypokalemia   Smoker   Severe sepsis ([facility])   Chronic diastolic CHF (congestive heart failure) (H[facility])   Acute respiratory failure ([facility])   Pleural effusion   Hepatic cirrhosis (H[facility])  Protein-calorie malnutrition, severe   Abdominal pain   Ascites   MRSA infection   Bacterial peritonitis ([place])  Septic shock secondary to intra-abdominal abscess with hypotension,  resolving Maintaining a map above 65 Leukocytosis trending down Afebrile Stop meropenem and continue IV vancomycin Appreciate ID recommendations  MRSA intraabdominal abscess complicated by bacterial peritonitis Management as stated above Obtain PICC line C/w IV van for 8 days.  Alcoholic cirrhosis, stable Self-reported has not followed with GI Will need to follow-up with GI outpatient Resume spironolactone and Lasix Monitor blood pressure closely  Ascites in etting of cirrhosis Post paracentesis on 08/26/2017 700 cc of greenish-yellow fluid removed by interventional radiology C/w IV vanc  Hypomagnesemia, repleted  Hypokalemia, repleted  Debility/ambulatory dysfunction PT recommends SNF Fall precautions Continue physical therapy CSW consulted to assist in placement    Procedures:  Paracentesis 08/26/2017  Consultations:  IV vancomycin  Discharge Exam: BP 126/80 (BP Location: Left Arm)   Pulse 88   Temp 98.6 F (37 C) (Oral)   Resp 16   Ht _0  (1.575 m)   Wt 46.7 kg (103 lb)   SpO2 98%   BMI 18.84 kg/m  . General: 67 y.o. year-old female well developed well nourished in no acute distress.  Alert and oriented x3. . Cardiovascular: Regular rate and rhythm with no rubs or gallops.  No thyromegaly or JVD noted.   Marland Kitchen Respiratory: Clear to auscultation with no wheezes or rales. Good inspiratory effort. . Abdomen: Soft nontender nondistended with normal bowel sounds x4 quadrants. . Musculoskeletal: No lower extremity edema. 2/4 pulses in all 4 extremities. . Skin: No ulcerative lesions noted or rashes, . Psychiatry: Mood is appropriate for condition and setting  Discharge Instructions You were cared for by a hospitalist during your hospital stay. If you have any questions about your discharge medications or the care you received while you were in the hospital after you are discharged, you can call the unit and asked to speak with the hospitalist on call if  the hospitalist that took care of you is not available. Once you are discharged, your primary care physician will handle any further medical issues. Please note that NO REFILLS for any discharge medications will be authorized once you are discharged, as it is imperative that you return to your primary care physician (or establish a relationship with a primary care physician if you do not have one) for your aftercare needs so that they can reassess your need for medications and monitor your lab values.  Discharge Instructions    Home infusion instructions [facility] May follow [place] Dosing Protocol; May administer Cathflo as needed to maintain patency of vascular access device.; Flushing of vascular access device: per [facility] Protocol: 0.9% NaCl pre/post medica...   Complete by:  As directed    Instructions:  May follow [place] Dosing Protocol   Instructions:  May administer Cathflo as needed to maintain patency of vascular access device.   Instructions:  Flushing of vascular access device: per [facility] Protocol: 0.9% NaCl pre/post medication administration and prn patency; Heparin 100 u/ml, 61m for implanted ports and Heparin 10u/ml, 512mfor all other central venous catheters.   Instructions:  May follow [facility] Anaphylaxis Protocol for First Dose Administration in the home: 0.9% NaCl at 25-50 ml/hr to maintain IV access for protocol meds. Epinephrine 0.3 ml IV/IM PRN and Benadryl 25-50 IV/IM PRN s/s of anaphylaxis.   Instructions:  [facility] Coordinator (RN) to assist per patient IV care  needs in the home PRN.     Allergies as of 08/29/2017      Reactions   Ciprofloxacin Rash   Levofloxacin Rash   Rash on back after 5 days therapy.    Nickel Rash   Anything with nickel in it.    Penicillins Rash   Tolerates cephalosporins  Has patient had a PCN reaction causing immediate rash, facial/tongue/throat swelling, SOB or lightheadedness with hypotension: No Has patient had a  PCN reaction causing severe rash involving mucus membranes or skin necrosis: No Ha patient had a PCN reaction that required hospitalization No Has patient had a PCN reaction occurring within the last 10 years: No If all of the above answers are "NO", then may proceed with: Cephalosporins       Medication List    STOP taking these medications   acetaminophen 500 MG tablet Commonly known as:  TYLENOL   naproxen sodium 220 MG tablet Commonly known as:  ALEVE   Potassium Chloride ER 20 MEQ Tbcr     TAKE these medications   folic acid 1 MG tablet Commonly known as:  FOLVITE Take 1 tablet (1 mg total) by mouth daily.   furosemide 40 MG tablet Commonly known as:  LASIX Take 1 tablet (40 mg total) by mouth daily.   nicotine 14 mg/24hr patch Commonly known as:  NICODERM CQ - dosed in mg/24 hours Place 1 patch (14 mg total) onto the skin daily. Start taking on:  08/30/2017   spironolactone 25 MG tablet Commonly known as:  ALDACTONE Take 0.5 tablets (12.5 mg total) by mouth daily.   thiamine 100 MG tablet Take 1 tablet (100 mg total) by mouth daily.   vancomycin IVPB Inject 500 mg into the vein every other day for 8 days. Indication:  Intra-abdominal/peritoneal abscess  Last Day of Therapy:  09/05/17 Labs - _0 /24/19 1217     Allergies  Allergen Reactions  . Ciprofloxacin Rash  . Levofloxacin Rash    Rash on back after 5 days therapy.   . Nickel Rash    Anything with nickel in it.   Marland Kitchen Penicillins Rash    Tolerates cephalosporins   Has patient  had a PCN reaction causing immediate rash, facial/tongue/throat swelling, SOB or lightheadedness with hypotension: No Has patient had a PCN reaction causing severe rash involving mucus membranes or skin necrosis: No Ha patient had a PCN reaction that required hospitalization No Has patient had a PCN reaction occurring within the last 10 years: No If all of the above answers are "NO", then may proceed with: Cephalosporins       Contact information for follow-up providers    Kathyrn Lass, MD Follow up in 3 day(s).   Specialty:  Family Medicine Why:  Please call to make an appointment. Contact information: [place] [address] [phone]        Carlyle Basques, MD. Call in 3 day(s).   Specialty:  Infectious Diseases Why:  PLEASE call to make an appointment. Contact information: [place] [address] [phone]            Contact information for after-discharge care    Destination    [facility] .   Service:  Skilled Nursing Contact information: [address] [place]  [place] [phone]                   The results of significant diagnostics from this hospitalization (including imaging, microbiology, ancillary and laboratory) are listed below for reference.    Significant Diagnostic Studies: Ct Abdomen Pelvis W Contrast  Result Date: 08/20/2017 CLINICAL DATA:  Abdominal pain, fever.  Follow-up abscess EXAM: CT ABDOMEN AND PELVIS WITH CONTRAST TECHNIQUE: Multidetector CT imaging of the abdomen and pelvis was performed using the standard protocol following bolus administration of intravenous contrast. CONTRAST:  100 mL ISOVUE-300 IOPAMIDOL (ISOVUE-300) INJECTION 61% COMPARISON:  08/15/2017 FINDINGS: Lower chest: New small bilateral pleural effusions with compressive atelectasis in the lower lobes. Heart is mildly enlarged. Hepatobiliary: Subtle nodularity to the liver surface. Enlargement of the caudate lobe. Diffuse hypodensity throughout the liver. Findings likely reflect cirrhosis. No focal hepatic abnormality. Gallbladder unremarkable. There appears to be pneumobilia within the distal common bile duct. Pancreas: No focal abnormality or ductal dilatation. Spleen: No focal abnormality.  Normal size. Adrenals/Urinary Tract: No adrenal abnormality. No focal renal abnormality. No stones or hydronephrosis. Urinary bladder is unremarkable. Stomach/Bowel: No evidence of bowel obstruction. Postoperative changes in the sigmoid colon. Distal gastric wall again appears to be edematous, similar to prior study. Vascular/Lymphatic: Aortic atherosclerosis. No enlarged abdominal or pelvic lymph nodes. Reproductive: IUD noted within the uterus.  No adnexal mass. Other: Moderate to large volume ascites noted throughout the abdomen and pelvis which appears loculated throughout the abdomen and pelvis. This is increased significantly since prior study. Previously seen left abdominal abscess has decreased significantly in size with pigtail drainage catheter in place. The  area currently measures 6.1 x 1.7 cm compared to approximately 9.1 x 4.9 cm previously. Musculoskeletal: No acute bony abnormality. Chronic compression fractures in the lower thoracic and upper lumbar spine. Diffuse degenerative disc and facet disease. IMPRESSION: Significant improvement in the left lateral abdominal abscess with pigtail drainage catheter in place, measurements as above. Only a small amount of drainable fluid remains. Significant increase in loculated moderate to large volume ascites throughout the abdomen and pelvis. Suspect changes of cirrhosis with heterogeneous low-density throughout the liver and mild nodular contours. New small bilateral pleural effusions, compressive atelectasis in the lower lobes. Electronically Signed   By: Rolm Baptise M.D.   On: 08/20/2017 10:40   Ct Abdomen Pelvis W Contrast  Result Date: 08/15/2017 CLINICAL DATA:  Generalized abdominal pain with hypertension EXAM: CT ABDOMEN AND PELVIS WITH CONTRAST TECHNIQUE: Multidetector CT imaging of the abdomen and pelvis was performed using the standard protocol following bolus administration of intravenous contrast. CONTRAST:  22m OMNIPAQUE IOHEXOL 300 MG/ML  SOLN COMPARISON:  04/07/2003 FINDINGS: Lower chest: Large right heart. Atherosclerotic calcification. No acute finding Hepatobiliary: Questionable lobulation of the liver surface. The caudate lobe is also large.No evidence of biliary obstruction or stone. Pancreas: Generalized atrophy. Spleen: Negative.  No definite enlargement. Adrenals/Urinary Tract: Negative adrenals. No hydronephrosis or stone. Unremarkable bladder. Stomach/Bowel: Evidence of prior sigmoidectomy. There is extensive colonic diverticulosis. Suspect appendectomy. The distal gastric wall is thickened by submucosal low-density. No discrete ulcer or mass is seen. Vascular/Lymphatic: Extensive atherosclerotic plaque on the aorta and visceral branches. Prominent mesenteric and upper retroperitoneal lymph  nodes which may be related to the above. Reproductive:IUD in the atrophic uterus. Other: Overall moderate ascites mainly seen about the liver and in the pelvis where there is signs of loculation and peritoneal thickening/enhancement. Areas of greatest peritoneal thickening seen below the liver, in the ventral left upper quadrant, and along the left pelvic sidewall. There is also a multilobulated thick walled cystic collection in the left paracolic gutter measuring up to 9 x 8.4 cm. Regional inflammation is mild. An abscess was seen in this location in 2004. Musculoskeletal: T10 and T11 vertebral body fractures that were seen on prior CT. There is interval T12 inferior endplate, L1 superior endplate, and L2 superior endplate fractures with mild depression. No retropulsion. These results were called by telephone at the time of interpretation on 08/15/2017 at 11:48 am to Dr. AEliezer Mccoy, who verbally acknowledged these results. IMPRESSION: 1. 9 cm irregular cystic mass in the left paracolic gutter, favor abscess over cavitary neoplasm. No pneumoperitoneum or adjacent bowel perforation to explain this collection, suspect missed perforation that has subsequently sealed. It is noted that there was an abscess in this location in 2004 by CT. 2. Moderate ascites with loculation and peritonitis findings, presumably from the same. 3. Suspect cirrhosis.  Consider SBP as cause of above. 4. Distal gastritis. 5. Mild mesenteric and upper retroperitoneal adenopathy presumably reactive to the above. Suggest EGD after convalescence. 6. T12, L1, and L2 endplate fractures that have occurred since chest CT 06/25/2017. There are older endplate fractures at TF38and T11. Electronically Signed   By: JMonte FantasiaM.D.   On: 08/15/2017 12:00   Dg Chest Port 1 View  Result Date: 08/21/2017 CLINICAL DATA:  Shortness of breath cough.  Congestion. EXAM: PORTABLE CHEST 1 VIEW COMPARISON:  08/18/2017. FINDINGS: Cardiomegaly with bilateral  pulmonary infiltrates/edema and bilateral pleural effusions. Similar findings noted on prior exams. Findings consistent with congestive heart failure with pulmonary edema. Bilateral pneumonia could also present this fashion. No significant interim change from prior exam. IMPRESSION: Findings consistent with congestive heart failure with bilateral pulmonary edema bilateral pleural effusions. No significant interim change from prior exam. Electronically Signed   By: THobucken  On: 08/21/2017 10:06   Dg Chest Port 1 View  Result Date: 08/18/2017 CLINICAL DATA:  Pleural effusion.  Congestive heart failure. EXAM: PORTABLE CHEST 1 VIEW COMPARISON:  One-view chest x-ray 08/16/2017. FINDINGS: The heart is enlarged. Bilateral pleural effusions are present. Bibasilar airspace disease is stable. Perihilar soft tissue prominence is unchanged. The visualized soft tissues and bony thorax are otherwise unremarkable. IMPRESSION: 1. Stable appearance of bilateral pleural effusions and associated airspace disease, likely atelectasis. Electronically Signed  By: San Morelle M.D.   On: 08/18/2017 07:32   Dg Chest Port 1 View  Result Date: 08/16/2017 CLINICAL DATA:  Shortness of breath and productive cough EXAM: PORTABLE CHEST 1 VIEW COMPARISON:  08/15/2017 FINDINGS: Cardiac shadow is enlarged accentuated by the portable technique. Aortic calcifications are again seen. Enlarging bilateral pleural effusions are noted right greater than left with associated basilar atelectasis. No pneumothorax is seen. IMPRESSION: Increasing basilar atelectasis and effusions. Electronically Signed   By: Inez Catalina M.D.   On: 08/16/2017 17:20   Dg Chest Port 1 View  Result Date: 08/15/2017 CLINICAL DATA:  Hypoxia. EXAM: PORTABLE CHEST 1 VIEW COMPARISON:  Chest x-ray dated July 28, 2017. FINDINGS: The heart size and mediastinal contours are within normal limits. Slightly increased peripheral interstitial markings are more  conspicuous when compared to prior study. Possible trace left pleural effusion. Low lung volumes with bibasilar atelectasis. No consolidation or pneumothorax. No acute osseous abnormality. IMPRESSION: New mild interstitial edema and possible trace left pleural effusion. Electronically Signed   By: Titus Dubin M.D.   On: 08/15/2017 10:54   Ir Image Guided Drainage Percut Cath  Peritoneal Retroperit  Result Date: 08/15/2017 INDICATION: 67 year old female with a history of prior colonic perforation and colon resection currently septic with evidence of a large abscess cavity in the left lower quadrant as well as ascites and evidence of peritoneal enhancement. She presents for percutaneous drain placement and paracentesis. EXAM: IR IMAGE GUIDED DRAINAGE PERCUT CATH PERITONEAL RETROPERIT; IR PARACENTESIS MEDICATIONS: The patient is currently admitted to the hospital and receiving intravenous antibiotics. The antibiotics were administered within an appropriate time frame prior to the initiation of the procedure. ANESTHESIA/SEDATION: Fentanyl 25 mcg IV; Versed 0.5 mg IV Moderate Sedation Time:  17 minutes The patient was continuously monitored during the procedure by the interventional radiology nurse under my direct supervision. COMPLICATIONS: None immediate. PROCEDURE: Informed written consent was obtained from the patient after a thorough discussion of the procedural risks, benefits and alternatives. All questions were addressed. Maximal Sterile Barrier Technique was utilized including caps, mask, sterile gowns, sterile gloves, sterile drape, hand hygiene and skin antiseptic. A timeout was performed prior to the initiation of the procedure. Ultrasound was used to interrogate the left upper quadrant. Perihepatic ascites is evident. Local anesthesia was attained by infiltration with 1% lidocaine. Under real-time sonographic guidance, a 17 gauge 7 cm Yueh centesis catheter was carefully advanced into the fluid  collection. An image was obtained and stored for the medical record. Paracentesis was then performed yielding approximately 400 mL of cloudy ascitic fluid. A sample was sent for culture. Attention was next turned to the left flank. Ultrasound demonstrates a large complex fluid collection affiliated with the left lower quadrant abdominal wall. Local anesthesia was again attained by infiltration with 1% lidocaine. A small dermatotomy was made. Under real-time sonographic guidance, a 10 French drainage catheter was advanced into the complex fluid collection using trocar technique. The catheter was advanced off the trocar and the locking pigtail loop was formed. Aspiration yields approximately 200 mL thick purulent fluid. The abscess cavity was flushed and the drainage catheter connected to JP bulb suction. The drain was then secured to the skin with 0 Prolene suture. IMPRESSION: 1. Paracentesis yields approximately 400 mL cloudy ascitic fluid highly concerning for bacterial peritonitis. 2. Percutaneous 12 French drain placement yielding approximately 200 mL of thick purulent fluid. Of note, the patient has a very thin body habitus in the drainage catheter is just deep to the  skin surface. Given the superficial nature of the drain, inadvertent displacement/removal is a concern. Recommend extreme care when changing bandages. Signed, Criselda Peaches, MD [facility] Radiology Electronically Signed   By: Jacqulynn Cadet M.D.   On: 08/15/2017 17:23   [place] Ekg Site Rite  Result Date: 08/29/2017 If Site Rite image not attached, placement could not be confirmed due to current cardiac rhythm.  Ir Paracentesis  Result Date: 08/26/2017 INDICATION: History of intra-abdominal abscess, cirrhosis, loculated ascites. Request for diagnostic and therapeutic paracentesis. EXAM: ULTRASOUND GUIDED PARACENTESIS MEDICATIONS: 1% Lidocaine = 8 mL COMPLICATIONS: None immediate.  PROCEDURE: Informed written consent was obtained from the patient after a discussion of the risks, benefits and alternatives to treatment. A timeout was performed prior to the initiation of the procedure. Initial ultrasound scanning demonstrates a moderate amount of loculated ascites within the left lower abdominal quadrant. The left lower abdomen was prepped and draped in the usual sterile fashion. 1% lidocaine with epinephrine was used for local anesthesia. Following this, a 19 gauge, 7-cm, Yueh catheter was introduced. An ultrasound image was saved for documentation purposes. The paracentesis was performed. The catheter was removed and a dressing was applied. The patient tolerated the procedure well without immediate post procedural complication. FINDINGS: A total of approximately 700 mL of greenish yellow fluid was removed. Samples were sent to the laboratory as requested by the clinical team. IMPRESSION: Successful ultrasound-guided paracentesis yielding 700 mL of peritoneal fluid. Read by: Gareth Eagle, PA-C Electronically Signed   By: Lucrezia Europe M.D.   On: 08/26/2017 12:09   Ir Paracentesis  Result Date: 08/20/2017 INDICATION: Patient with intra-abdominal abscess, cirrhosis, loculated ascites. Request made for diagnostic and therapeutic paracentesis. EXAM: ULTRASOUND GUIDED DIAGNOSTIC AND THERAPEUTIC PARACENTESIS MEDICATIONS: None COMPLICATIONS: None immediate. PROCEDURE: Informed written consent was obtained from the patient after a discussion of the risks, benefits and alternatives to treatment. A timeout was performed prior to the initiation of the procedure. Initial ultrasound scanning demonstrates a moderate amount of loculated ascites within the left lower abdominal quadrant. The left lower abdomen was prepped and draped in the usual sterile fashion. 1% lidocaine was used for local anesthesia. Following this, a 19 gauge, 7-cm, Yueh catheter was introduced. An ultrasound image was saved for  documentation purposes. The paracentesis was performed. The catheter was removed and a dressing was applied. The patient tolerated the procedure well without immediate post procedural complication. FINDINGS: A total of approximately 250 cc of turbid, yellow fluid was removed. Samples were sent to the laboratory as requested by the clinical team. Due to significant loculated nature of ascites only the above amount of fluid could be removed today. IMPRESSION: Successful ultrasound-guided diagnostic/therapeutic paracentesis yielding 250 cc of peritoneal fluid. Read by: Rowe Robert, PA-C Electronically Signed   By: Sandi Mariscal M.D.   On: 08/20/2017 12:44   Ir Paracentesis  Result Date: 08/15/2017 INDICATION: 67 year old female with a history of prior colonic perforation and colon resection currently septic with evidence of a large abscess cavity in the left lower quadrant as well as ascites and evidence of peritoneal enhancement. She presents for percutaneous drain placement and paracentesis. EXAM: IR IMAGE GUIDED DRAINAGE PERCUT CATH PERITONEAL RETROPERIT; IR PARACENTESIS MEDICATIONS: The patient is currently admitted to the hospital and receiving intravenous antibiotics. The antibiotics were administered within an appropriate time frame prior to the initiation of the procedure. ANESTHESIA/SEDATION: Fentanyl 25 mcg IV; Versed 0.5 mg IV Moderate Sedation Time:  17 minutes The patient was continuously  monitored during the procedure by the interventional radiology nurse under my direct supervision. COMPLICATIONS: None immediate. PROCEDURE: Informed written consent was obtained from the patient after a thorough discussion of the procedural risks, benefits and alternatives. All questions were addressed. Maximal Sterile Barrier Technique was utilized including caps, mask, sterile gowns, sterile gloves, sterile drape, hand hygiene and skin antiseptic. A timeout was performed prior to the initiation of the procedure.  Ultrasound was used to interrogate the left upper quadrant. Perihepatic ascites is evident. Local anesthesia was attained by infiltration with 1% lidocaine. Under real-time sonographic guidance, a 17 gauge 7 cm Yueh centesis catheter was carefully advanced into the fluid collection. An image was obtained and stored for the medical record. Paracentesis was then performed yielding approximately 400 mL of cloudy ascitic fluid. A sample was sent for culture. Attention was next turned to the left flank. Ultrasound demonstrates a large complex fluid collection affiliated with the left lower quadrant abdominal wall. Local anesthesia was again attained by infiltration with 1% lidocaine. A small dermatotomy was made. Under real-time sonographic guidance, a 10 French drainage catheter was advanced into the complex fluid collection using trocar technique. The catheter was advanced off the trocar and the locking pigtail loop was formed. Aspiration yields approximately 200 mL thick purulent fluid. The abscess cavity was flushed and the drainage catheter connected to JP bulb suction. The drain was then secured to the skin with 0 Prolene suture. IMPRESSION: 1. Paracentesis yields approximately 400 mL cloudy ascitic fluid highly concerning for bacterial peritonitis. 2. Percutaneous 12 French drain placement yielding approximately 200 mL of thick purulent fluid. Of note, the patient has a very thin body habitus in the drainage catheter is just deep to the skin surface. Given the superficial nature of the drain, inadvertent displacement/removal is a concern. Recommend extreme care when changing bandages. Signed, Criselda Peaches, MD [facility] Electronically Signed   By: Jacqulynn Cadet M.D.   On: 08/15/2017 17:23    Microbiology: Recent Results (from the past 240 hour(s))  Culture, body fluid-bottle     Status: Abnormal   Collection Time: 08/20/17 12:41 PM    Result Value Ref Range Status   Specimen Description PERITONEAL  Final   Special Requests   Final    NONE Performed at [facility], [address], [place], [place] [address]    Gram Stain   Final    ANAEROBIC BOTTLE ONLY GRAM POSITIVE COCCI CRITICAL RESULT CALLED TO, READ BACK BY AND VERIFIED WITH: A JETER RN 08/23/17 0612 JDW    Culture METHICILLIN RESISTANT STAPHYLOCOCCUS AUREUS (A)  Final   Report Status 08/26/2017 FINAL  Final   Organism ID, Bacteria METHICILLIN RESISTANT STAPHYLOCOCCUS AUREUS  Final      Susceptibility   Methicillin resistant staphylococcus aureus - MIC*    CIPROFLOXACIN >=8 RESISTANT Resistant     ERYTHROMYCIN >=8 RESISTANT Resistant     GENTAMICIN <=0.5 SENSITIVE Sensitive     OXACILLIN >=4 RESISTANT Resistant     TETRACYCLINE <=1 SENSITIVE Sensitive     VANCOMYCIN 1 SENSITIVE Sensitive     TRIMETH/SULFA <=10 SENSITIVE Sensitive     CLINDAMYCIN RESISTANT Resistant     RIFAMPIN <=0.5 SENSITIVE Sensitive     Inducible Clindamycin POSITIVE Resistant     * METHICILLIN RESISTANT STAPHYLOCOCCUS AUREUS  Gram stain     Status: None   Collection Time: 08/20/17 12:41 PM  Result Value Ref Range Status   Specimen Description PERITONEAL  Final  Special Requests NONE  Final   Gram Stain   Final    FEW WBC PRESENT, PREDOMINANTLY PMN NO ORGANISMS SEEN Performed at [facility], [address] [place], [place] [address]    Report Status 08/20/2017 FINAL  Final  Culture, blood (Routine X 2) w Reflex to ID Panel     Status: None (Preliminary result)   Collection Time: 08/25/17 11:20 AM  Result Value Ref Range Status   Specimen Description BLOOD RIGHT ANTECUBITAL  Final   Special Requests   Final    BOTTLES DRAWN AEROBIC ONLY Blood Culture adequate volume   Culture   Final    NO GROWTH 3 DAYS Performed at [facility], [place] [address], [place], [place] [address]    Report Status PENDING  Incomplete  Culture, blood (Routine X 2) w Reflex to ID  Panel     Status: None (Preliminary result)   Collection Time: 08/25/17 11:25 AM  Result Value Ref Range Status   Specimen Description BLOOD RIGHT HAND  Final   Special Requests   Final    BOTTLES DRAWN AEROBIC ONLY Blood Culture results may not be optimal due to an inadequate volume of blood received in culture bottles   Culture   Final    NO GROWTH 3 DAYS Performed at [facility], [place] [address], [place], [place] [address]    Report Status PENDING  Incomplete  Gram stain     Status: None   Collection Time: 08/26/17 12:05 PM  Result Value Ref Range Status   Specimen Description PERITONEAL FLUID  Final   Special Requests NONE  Final   Gram Stain   Final    WBC PRESENT,BOTH PMN AND MONONUCLEAR GRAM POSITIVE COCCI CYTOSPIN SMEAR Performed at [facility], [place] [address] [place], [place] [address]    Report Status 08/26/2017 FINAL  Final  Culture, body fluid-bottle     Status: None (Preliminary result)   Collection Time: 08/26/17 12:06 PM  Result Value Ref Range Status   Specimen Description PERITONEAL FLUID  Final   Special Requests NONE  Final   Culture   Final    NO GROWTH 2 DAYS Performed at [place] [address], [place], [place] [address]    Report Status PENDING  Incomplete     Labs: Basic Metabolic Panel: Recent Labs  Lab 08/23/17 0500  08/25/17 0518 08/26/17 0332 08/27/17 0426 08/28/17 0257 08/29/17 0449  NA 133*  --   --  132* 133* 135 137  K 4.4  --   --  3.6 4.4 3.9 3.7  CL 92*  --   --  93* 96* 94* 92*  CO2 33*  --   --  31 30 33* 35*  GLUCOSE 83  --   --  80 75 112* 87  BUN 9  --   --  _0 CREATININE 0.82  --   --  0.90 0.86 0.82 0.81  CALCIUM 8.2*  --   --  8.0* 8.3* 8.2* 8.4*  MG 1.7   < > 1.4* 1.4* 1.9 1.9 1.4*   < > = values in this interval not displayed.   Liver Function Tests: Recent Labs  Lab 08/23/17 0500  AST 27  ALT 9*  ALKPHOS 120  BILITOT 1.2  PROT 5.7*  ALBUMIN 2.0*   No results for input(s):  LIPASE, AMYLASE in the last 168 hours. No results for input(s): AMMONIA in  the last 168 hours. CBC: Recent Labs  Lab 08/25/17 0518 08/26/17 0332 08/27/17 0426 08/28/17 0257 08/29/17 0449  WBC 25.4* 20.5* 12.5* 12.5* 11.2*  HGB 10.4* 9.9* 10.3* 10.0* 9.9*  HCT 33.3* 31.6* 32.8* 30.7* 31.7*  MCV 86.3 86.3 87.0 84.8 85.0  PLT 443* 502* 529* 581* 531*   Cardiac Enzymes: No results for input(s): CKTOTAL, CKMB, CKMBINDEX, TROPONINI in the last 168 hours. BNP: BNP (last 3 results) Recent Labs    06/24/17 2049 08/15/17 1002 08/21/17 0415  BNP 1,266.6* 2,427.4* 3,278.5*    ProBNP (last 3 results) No results for input(s): PROBNP in the last 8760 hours.  CBG: Recent Labs  Lab 08/28/17 0441 08/28/17 0752 08/28/17 1240 08/28/17 1754 08/29/17 0746  GLUCAP 106* 81 115* 140* 76       Signed:  Kayleen Memos, MD [facility] 08/29/2017, 12:21 PM

## 2017-08-29 NOTE — Clinical Social Work Placement (Signed)
   CLINICAL SOCIAL WORK PLACEMENT  NOTE  Date:  08/29/2017  Patient Details  Name: Rachel Williams MRN: [id number] Date of Birth: 08-20-1950  Clinical Social Work is seeking post-discharge placement for this patient at the Skilled  Nursing Facility level of care (*CSW will initial, date and re-position this form in  chart as items are completed):  Yes   Patient/family provided with [facility] Clinical Social Work Department's list of facilities offering this level of care within the geographic area requested by the patient (or if unable, by the patient's family).  Yes   Patient/family informed of their freedom to choose among providers that offer the needed level of care, that participate in Medicare, Medicaid or managed care program needed by the patient, have an available bed and are willing to accept the patient.  Yes   Patient/family informed of 's ownership interest in [facility] and [facility], as well as of the fact that they are under no obligation to receive care at these facilities.  PASRR submitted to EDS on 08/28/17     PASRR number received on 08/28/17     Existing PASRR number confirmed on       FL2 transmitted to all facilities in geographic area requested by pt/family on 08/28/17     FL2 transmitted to all facilities within larger geographic area on       Patient informed that his/her managed care company has contracts with or will negotiate with certain facilities, including the following:        Yes   Patient/family informed of bed offers received.  Patient chooses bed at [facility]     Physician recommends and patient chooses bed at      Patient to be transferred to [facility] on 08/29/17.  Patient to be transferred to facility by Daughter by car     Patient family notified on 08/29/17 of transfer.  Name of family member notified:  Daughter, Vernona Rieger     PHYSICIAN       Additional Comment:     _______________________________________________ Mearl Latin, LCSWA 08/29/2017, 12:50 PM

## 2017-08-29 NOTE — Progress Notes (Signed)
MD verbal order to switch PICC placement from IR to IV team. IV team will need to place the PICC line.

## 2017-08-29 NOTE — Progress Notes (Signed)
Patient's daughter is unable to pick her up until 5:30 pm.

## 2017-08-29 NOTE — Progress Notes (Signed)
Patient will DC to: [facility] Anticipated DC date: 08/29/17 Family notified: Daughter, Vernona Rieger Transport by: Daughter by car   Per MD patient ready for DC to [facility]. RN, patient, patient's family, and facility notified of DC. Discharge Summary sent to facility. RN given number for report [phone]).   CSW signing off.  Cristobal Goldmann, LCSW Clinical Social Worker [phone]

## 2017-08-30 LAB — CULTURE, BLOOD (ROUTINE X 2)
CULTURE: NO GROWTH
Culture: NO GROWTH
Special Requests: ADEQUATE

## 2017-08-31 LAB — CULTURE, BODY FLUID W GRAM STAIN -BOTTLE: Culture: NO GROWTH

## 2017-09-30 MED FILL — SPIRONOLACTONE 25 MG TABLET: 25 | 30 days supply | Qty: 15 | Fill #1

## 2017-11-03 MED FILL — SPIRONOLACTONE 25 MG TABS: 25 | 30 days supply | Qty: 15 | Fill #2

## 2017-11-18 ENCOUNTER — Encounter (HOSPITAL_COMMUNITY): Payer: Self-pay | Admitting: Internal Medicine

## 2017-11-19 MED FILL — FUROSEMIDE 40 MG TAB: 40 | 90 days supply | Qty: 90 | Fill #0

## 2017-11-26 MED FILL — RESTASIS 0.05% EYE EMULSION: 0.05 | 90 days supply | Qty: 180 | Fill #0

## 2017-12-01 MED FILL — FOLIC ACID 1 MG TABS: 1 | 90 days supply | Qty: 90 | Fill #0

## 2017-12-01 MED FILL — SPIRONOLACTONE 25 MG TABS: 25 | 30 days supply | Qty: 15 | Fill #3

## 2018-01-02 MED FILL — SPIRONOLACTONE 25 MG TABS: 25 | 30 days supply | Qty: 15 | Fill #4

## 2018-01-28 ENCOUNTER — Encounter (HOSPITAL_COMMUNITY): Payer: Self-pay | Admitting: Internal Medicine

## 2018-02-02 MED FILL — SPIRONOLACTONE 25 MG TABS: 25 | 30 days supply | Qty: 15 | Fill #5

## 2018-03-03 MED FILL — FOLIC ACID 1 MG TABS: 1 | 90 days supply | Qty: 90 | Fill #0

## 2018-03-03 MED FILL — SPIRONOLACTONE 25 MG TABS: 25 | 30 days supply | Qty: 15 | Fill #6

## 2018-03-03 MED FILL — FUROSEMIDE 40 MG TAB: 40 | 90 days supply | Qty: 90 | Fill #0

## 2018-03-23 MED FILL — LOTEMAX SM 0.38 % GEL: 0.38 | 79 days supply | Qty: 5 | Fill #0

## 2018-03-31 MED FILL — SPIRONOLACTONE 25 MG TABS: 25 | 90 days supply | Qty: 45 | Fill #0

## 2018-04-02 MED FILL — HYDROCODONE-IBUPROFEN 5-200: 5-200 | 5 days supply | Qty: 30 | Fill #0

## 2018-04-13 MED FILL — HYDROCODONE-IBUPROFEN 5-200: 5-200 | 4 days supply | Qty: 20 | Fill #0

## 2018-04-14 MED FILL — ONDANSETRON HCL 4 MG TABLET: 4 | 30 days supply | Qty: 30 | Fill #0

## 2018-04-16 MED FILL — POTASSIUM CHLORIDE CRYS ER: 20 | 15 days supply | Qty: 15 | Fill #0

## 2018-04-16 MED FILL — FUROSEMIDE 20 MG TABS: 20 | 30 days supply | Qty: 15 | Fill #0

## 2018-04-30 MED FILL — CARVEDILOL 3.125 MG TABLET: 3.125 | 30 days supply | Qty: 60 | Fill #0

## 2018-05-04 ENCOUNTER — Emergency Department (HOSPITAL_COMMUNITY): Payer: Medicare Other

## 2018-05-04 ENCOUNTER — Inpatient Hospital Stay (HOSPITAL_COMMUNITY)
Admission: EM | Admit: 2018-05-04 | Discharge: 2018-05-06 | DRG: 291 | Disposition: A | Discharge status: Nursing Home (Includes ICF) | Payer: Medicare Other | Source: Skilled Nursing Facility | Attending: Internal Medicine | Admitting: Internal Medicine

## 2018-05-04 ENCOUNTER — Encounter (HOSPITAL_COMMUNITY): Payer: Self-pay

## 2018-05-04 DIAGNOSIS — Z79899 Other long term (current) drug therapy: Secondary | ICD-10-CM | POA: Diagnosis not present

## 2018-05-04 DIAGNOSIS — E43 Unspecified severe protein-calorie malnutrition: Secondary | ICD-10-CM | POA: Diagnosis present

## 2018-05-04 DIAGNOSIS — E876 Hypokalemia: Secondary | ICD-10-CM | POA: Diagnosis present

## 2018-05-04 DIAGNOSIS — J449 Chronic obstructive pulmonary disease, unspecified: Secondary | ICD-10-CM | POA: Diagnosis present

## 2018-05-04 DIAGNOSIS — F17210 Nicotine dependence, cigarettes, uncomplicated: Secondary | ICD-10-CM | POA: Diagnosis present

## 2018-05-04 DIAGNOSIS — E0590 Thyrotoxicosis, unspecified without thyrotoxic crisis or storm: Secondary | ICD-10-CM | POA: Diagnosis present

## 2018-05-04 DIAGNOSIS — Z933 Colostomy status: Secondary | ICD-10-CM | POA: Diagnosis not present

## 2018-05-04 DIAGNOSIS — J9601 Acute respiratory failure with hypoxia: Secondary | ICD-10-CM | POA: Diagnosis present

## 2018-05-04 DIAGNOSIS — F1020 Alcohol dependence, uncomplicated: Secondary | ICD-10-CM | POA: Diagnosis present

## 2018-05-04 DIAGNOSIS — I5033 Acute on chronic diastolic (congestive) heart failure: Secondary | ICD-10-CM | POA: Diagnosis not present

## 2018-05-04 DIAGNOSIS — I361 Nonrheumatic tricuspid (valve) insufficiency: Secondary | ICD-10-CM | POA: Diagnosis not present

## 2018-05-04 DIAGNOSIS — J690 Pneumonitis due to inhalation of food and vomit: Secondary | ICD-10-CM | POA: Diagnosis present

## 2018-05-04 DIAGNOSIS — R0602 Shortness of breath: Secondary | ICD-10-CM

## 2018-05-04 DIAGNOSIS — I2510 Atherosclerotic heart disease of native coronary artery without angina pectoris: Secondary | ICD-10-CM | POA: Diagnosis present

## 2018-05-04 DIAGNOSIS — Z880 Allergy status to penicillin: Secondary | ICD-10-CM | POA: Diagnosis not present

## 2018-05-04 DIAGNOSIS — I272 Pulmonary hypertension, unspecified: Secondary | ICD-10-CM | POA: Diagnosis present

## 2018-05-04 DIAGNOSIS — I340 Nonrheumatic mitral (valve) insufficiency: Secondary | ICD-10-CM | POA: Diagnosis not present

## 2018-05-04 DIAGNOSIS — J9621 Acute and chronic respiratory failure with hypoxia: Secondary | ICD-10-CM | POA: Diagnosis present

## 2018-05-04 LAB — MRSA PCR SCREENING: MRSA by PCR: NEGATIVE

## 2018-05-04 LAB — CBC WITH DIFFERENTIAL/PLATELET
Abs Immature Granulocytes: 0.05 10*3/uL (ref 0.00–0.07)
Basophils Absolute: 0 10*3/uL (ref 0.0–0.1)
Basophils Relative: 0 %
Eosinophils Absolute: 0 10*3/uL (ref 0.0–0.5)
Eosinophils Relative: 0 %
HCT: 37.2 % (ref 36.0–46.0)
Hemoglobin: 11.9 g/dL — ABNORMAL LOW (ref 12.0–15.0)
Immature Granulocytes: 1 %
Lymphocytes Relative: 16 %
Lymphs Abs: 1.2 10*3/uL (ref 0.7–4.0)
MCH: 28.1 pg (ref 26.0–34.0)
MCHC: 32 g/dL (ref 30.0–36.0)
MCV: 87.9 fL (ref 80.0–100.0)
Monocytes Absolute: 0.4 10*3/uL (ref 0.1–1.0)
Monocytes Relative: 5 %
NEUTROS ABS: 5.7 10*3/uL (ref 1.7–7.7)
NEUTROS PCT: 78 %
Platelets: 171 10*3/uL (ref 150–400)
RBC: 4.23 MIL/uL (ref 3.87–5.11)
RDW: 14.6 % (ref 11.5–15.5)
WBC: 7.4 10*3/uL (ref 4.0–10.5)
nRBC: 0 % (ref 0.0–0.2)

## 2018-05-04 LAB — POCT I-STAT 7, (LYTES, BLD GAS, ICA,H+H)
Acid-Base Excess: 2 mmol/L (ref 0.0–2.0)
Bicarbonate: 27.4 mmol/L (ref 20.0–28.0)
CALCIUM ION: 1.2 mmol/L (ref 1.15–1.40)
HCT: 31 % — ABNORMAL LOW (ref 36.0–46.0)
Hemoglobin: 10.5 g/dL — ABNORMAL LOW (ref 12.0–15.0)
O2 Saturation: 100 %
Patient temperature: 98.6
Potassium: 4.5 mmol/L (ref 3.5–5.1)
Sodium: 135 mmol/L (ref 135–145)
TCO2: 29 mmol/L (ref 22–32)
pCO2 arterial: 44.9 mmHg (ref 32.0–48.0)
pH, Arterial: 7.394 (ref 7.350–7.450)
pO2, Arterial: 459 mmHg — ABNORMAL HIGH (ref 83.0–108.0)

## 2018-05-04 LAB — TROPONIN I: Troponin I: 0.08 ng/mL (ref <0.03)

## 2018-05-04 LAB — COMPREHENSIVE METABOLIC PANEL
ALT: 32 U/L (ref 0–44)
AST: 54 U/L — ABNORMAL HIGH (ref 15–41)
Albumin: 2.7 g/dL — ABNORMAL LOW (ref 3.5–5.0)
Alkaline Phosphatase: 173 U/L — ABNORMAL HIGH (ref 38–126)
Anion gap: 15 (ref 5–15)
BUN: 16 mg/dL (ref 8–23)
CO2: 23 mmol/L (ref 22–32)
Calcium: 9.3 mg/dL (ref 8.9–10.3)
Chloride: 98 mmol/L (ref 98–111)
Creatinine, Ser: 0.79 mg/dL (ref 0.44–1.00)
GFR calc Af Amer: >60 mL/min (ref >60)
GFR calc non Af Amer: >60 mL/min (ref >60)
Glucose, Bld: 79 mg/dL (ref 70–99)
POTASSIUM: 4.5 mmol/L (ref 3.5–5.1)
Sodium: 136 mmol/L (ref 135–145)
Total Bilirubin: 1.9 mg/dL — ABNORMAL HIGH (ref 0.3–1.2)
Total Protein: 6.2 g/dL — ABNORMAL LOW (ref 6.5–8.1)

## 2018-05-04 LAB — HEPATIC FUNCTION PANEL
ALT: 32 U/L (ref 0–44)
AST: 53 U/L — ABNORMAL HIGH (ref 15–41)
Albumin: 2.4 g/dL — ABNORMAL LOW (ref 3.5–5.0)
Alkaline Phosphatase: 138 U/L — ABNORMAL HIGH (ref 38–126)
Bilirubin, Direct: 0.5 mg/dL — ABNORMAL HIGH (ref 0.0–0.2)
Indirect Bilirubin: 0.6 mg/dL (ref 0.3–0.9)
Total Bilirubin: 1.1 mg/dL (ref 0.3–1.2)
Total Protein: 5.4 g/dL — ABNORMAL LOW (ref 6.5–8.1)

## 2018-05-04 LAB — URINALYSIS, ROUTINE W REFLEX MICROSCOPIC
Bacteria, UA: NONE SEEN
Bilirubin Urine: NEGATIVE
Glucose, UA: >=500 mg/dL — AB
Hgb urine dipstick: NEGATIVE
Ketones, ur: NEGATIVE mg/dL
Nitrite: NEGATIVE
Protein, ur: NEGATIVE mg/dL
Specific Gravity, Urine: 1.017 (ref 1.005–1.030)
pH: 6 (ref 5.0–8.0)

## 2018-05-04 LAB — CBC
HCT: 33.6 % — ABNORMAL LOW (ref 36.0–46.0)
Hemoglobin: 10.5 g/dL — ABNORMAL LOW (ref 12.0–15.0)
MCH: 26.6 pg (ref 26.0–34.0)
MCHC: 31.3 g/dL (ref 30.0–36.0)
MCV: 85.1 fL (ref 80.0–100.0)
Platelets: 151 10*3/uL (ref 150–400)
RBC: 3.95 MIL/uL (ref 3.87–5.11)
RDW: 14.5 % (ref 11.5–15.5)
WBC: 2.8 10*3/uL — ABNORMAL LOW (ref 4.0–10.5)
nRBC: 0 % (ref 0.0–0.2)

## 2018-05-04 LAB — PHOSPHORUS: Phosphorus: 3.4 mg/dL (ref 2.5–4.6)

## 2018-05-04 LAB — PROTIME-INR
INR: 1.36
Prothrombin Time: 16.6 seconds — ABNORMAL HIGH (ref 11.4–15.2)

## 2018-05-04 LAB — T4, FREE: Free T4: 5.38 ng/dL — ABNORMAL HIGH (ref 0.82–1.77)

## 2018-05-04 LAB — D-DIMER, QUANTITATIVE: D-Dimer, Quant: 3.46 ug/mL-FEU — ABNORMAL HIGH (ref 0.00–0.50)

## 2018-05-04 LAB — MAGNESIUM: Magnesium: 1.1 mg/dL — ABNORMAL LOW (ref 1.7–2.4)

## 2018-05-04 LAB — CREATININE, SERUM
Creatinine, Ser: 1 mg/dL (ref 0.44–1.00)
GFR calc Af Amer: >60 mL/min (ref >60)
GFR calc non Af Amer: 57 mL/min — ABNORMAL LOW (ref >60)

## 2018-05-04 LAB — I-STAT TROPONIN, ED: TROPONIN I, POC: 0.05 ng/mL (ref 0.00–0.08)

## 2018-05-04 LAB — I-STAT CREATININE, ED: Creatinine, Ser: 0.6 mg/dL (ref 0.44–1.00)

## 2018-05-04 LAB — BRAIN NATRIURETIC PEPTIDE: B Natriuretic Peptide: 2819.7 pg/mL — ABNORMAL HIGH (ref 0.0–100.0)

## 2018-05-04 LAB — LACTIC ACID, PLASMA: Lactic Acid, Venous: 2.8 mmol/L (ref 0.5–1.9)

## 2018-05-04 LAB — TSH

## 2018-05-04 MED ORDER — IOPAMIDOL (ISOVUE-370) INJECTION 76%
100.0000 mL | Freq: Once | INTRAVENOUS | Status: AC | PRN
  Administered 2018-05-04: 55 mL via INTRAVENOUS

## 2018-05-04 MED ORDER — VITAMIN B-1 100 MG PO TABS
100.0000 mg | ORAL_TABLET | Freq: Every day | ORAL | Status: DC
  Administered 2018-05-04 – 2018-05-06 (×3): 100 mg via ORAL
  Filled 2018-05-04 (×3): qty 1

## 2018-05-04 MED ORDER — IOPAMIDOL (ISOVUE-370) INJECTION 76%
INTRAVENOUS | Status: AC
  Filled 2018-05-04: qty 100

## 2018-05-04 MED ORDER — FOLIC ACID 1 MG PO TABS
1.0000 mg | ORAL_TABLET | Freq: Every day | ORAL | Status: DC
  Administered 2018-05-04 – 2018-05-06 (×3): 1 mg via ORAL
  Filled 2018-05-04 (×3): qty 1

## 2018-05-04 MED ORDER — FOLIC ACID 1 MG PO TABS: 1.0000 mg | ORAL_TABLET | Freq: Every day | ORAL | Status: DC

## 2018-05-04 MED ORDER — LEVALBUTEROL HCL 0.63 MG/3ML IN NEBU
0.6300 mg | INHALATION_SOLUTION | Freq: Three times a day (TID) | RESPIRATORY_TRACT | Status: DC
  Administered 2018-05-05: 0.63 mg via RESPIRATORY_TRACT
  Filled 2018-05-04: qty 3

## 2018-05-04 MED ORDER — IPRATROPIUM BROMIDE 0.02 % IN SOLN
0.5000 mg | Freq: Three times a day (TID) | RESPIRATORY_TRACT | Status: DC
  Administered 2018-05-05: 0.5 mg via RESPIRATORY_TRACT
  Filled 2018-05-04: qty 2.5

## 2018-05-04 MED ORDER — LEVALBUTEROL HCL 0.63 MG/3ML IN NEBU
INHALATION_SOLUTION | RESPIRATORY_TRACT | Status: AC
  Filled 2018-05-04: qty 3

## 2018-05-04 MED ORDER — LORAZEPAM 1 MG PO TABS: 1.0000 mg | ORAL_TABLET | Freq: Four times a day (QID) | ORAL | Status: DC | PRN

## 2018-05-04 MED ORDER — ENOXAPARIN SODIUM 40 MG/0.4ML ~~LOC~~ SOLN
40.0000 mg | SUBCUTANEOUS | Status: DC
  Administered 2018-05-04: 40 mg via SUBCUTANEOUS
  Filled 2018-05-04: qty 0.4

## 2018-05-04 MED ORDER — NICOTINE 14 MG/24HR TD PT24: 14.0000 mg | MEDICATED_PATCH | Freq: Every day | TRANSDERMAL | Status: DC

## 2018-05-04 MED ORDER — LEVALBUTEROL HCL 0.63 MG/3ML IN NEBU
0.6300 mg | INHALATION_SOLUTION | Freq: Four times a day (QID) | RESPIRATORY_TRACT | Status: DC
  Administered 2018-05-04: 0.63 mg via RESPIRATORY_TRACT
  Filled 2018-05-04: qty 3

## 2018-05-04 MED ORDER — LORAZEPAM 1 MG PO TABS: 0.0000 mg | ORAL_TABLET | Freq: Two times a day (BID) | ORAL | Status: DC

## 2018-05-04 MED ORDER — METHYLPREDNISOLONE SODIUM SUCC 125 MG IJ SOLR: 125.0000 mg | Freq: Once | INTRAMUSCULAR | Status: DC

## 2018-05-04 MED ORDER — THIAMINE HCL 100 MG/ML IJ SOLN: 100.0000 mg | Freq: Every day | INTRAMUSCULAR | Status: DC

## 2018-05-04 MED ORDER — METHYLPREDNISOLONE SODIUM SUCC 40 MG IJ SOLR
40.0000 mg | Freq: Four times a day (QID) | INTRAMUSCULAR | Status: DC
  Administered 2018-05-04 – 2018-05-06 (×7): 40 mg via INTRAVENOUS
  Filled 2018-05-04 (×7): qty 1

## 2018-05-04 MED ORDER — METOPROLOL TARTRATE 12.5 MG HALF TABLET
12.5000 mg | ORAL_TABLET | Freq: Three times a day (TID) | ORAL | Status: DC
  Administered 2018-05-05 – 2018-05-06 (×3): 12.5 mg via ORAL
  Filled 2018-05-04 (×4): qty 1

## 2018-05-04 MED ORDER — SODIUM CHLORIDE 0.9 % IV SOLN
1.0000 g | Freq: Three times a day (TID) | INTRAVENOUS | Status: DC
  Administered 2018-05-05 (×2): 1 g via INTRAVENOUS
  Filled 2018-05-04 (×5): qty 1

## 2018-05-04 MED ORDER — SPIRONOLACTONE 12.5 MG HALF TABLET
12.5000 mg | ORAL_TABLET | Freq: Every day | ORAL | Status: DC
  Administered 2018-05-05 – 2018-05-06 (×2): 12.5 mg via ORAL
  Filled 2018-05-04 (×2): qty 1

## 2018-05-04 MED ORDER — LEVALBUTEROL HCL 0.63 MG/3ML IN NEBU
0.6300 mg | INHALATION_SOLUTION | Freq: Once | RESPIRATORY_TRACT | Status: AC
  Administered 2018-05-04: 0.63 mg via RESPIRATORY_TRACT

## 2018-05-04 MED ORDER — METHIMAZOLE 10 MG PO TABS
10.0000 mg | ORAL_TABLET | Freq: Two times a day (BID) | ORAL | Status: DC
  Administered 2018-05-04 – 2018-05-06 (×4): 10 mg via ORAL
  Filled 2018-05-04 (×4): qty 1

## 2018-05-04 MED ORDER — LORAZEPAM 1 MG PO TABS: 0.0000 mg | ORAL_TABLET | Freq: Four times a day (QID) | ORAL | Status: DC

## 2018-05-04 MED ORDER — SILVER SULFADIAZINE 1 % EX CREA
TOPICAL_CREAM | Freq: Once | CUTANEOUS | Status: AC
  Administered 2018-05-04: 16:00:00 via TOPICAL
  Filled 2018-05-04: qty 85

## 2018-05-04 MED ORDER — SODIUM CHLORIDE 0.9 % IV SOLN
1.0000 g | Freq: Once | INTRAVENOUS | Status: AC
  Administered 2018-05-04: 1 g via INTRAVENOUS
  Filled 2018-05-04: qty 10

## 2018-05-04 MED ORDER — SODIUM CHLORIDE 0.9 % IV SOLN
2.0000 g | Freq: Once | INTRAVENOUS | Status: AC
  Administered 2018-05-04: 2 g via INTRAVENOUS
  Filled 2018-05-04: qty 2

## 2018-05-04 MED ORDER — ASPIRIN 81 MG PO CHEW
324.0000 mg | CHEWABLE_TABLET | Freq: Once | ORAL | Status: AC
  Administered 2018-05-04: 324 mg via ORAL
  Filled 2018-05-04: qty 4

## 2018-05-04 MED ORDER — BUDESONIDE 0.25 MG/2ML IN SUSP
0.2500 mg | Freq: Two times a day (BID) | RESPIRATORY_TRACT | Status: DC
  Administered 2018-05-04 – 2018-05-06 (×4): 0.25 mg via RESPIRATORY_TRACT
  Filled 2018-05-04 (×4): qty 2

## 2018-05-04 MED ORDER — THIAMINE HCL 100 MG PO TABS: 100.0000 mg | ORAL_TABLET | Freq: Every day | ORAL | Status: DC

## 2018-05-04 MED ORDER — FUROSEMIDE 10 MG/ML IJ SOLN
20.0000 mg | Freq: Two times a day (BID) | INTRAMUSCULAR | Status: DC
  Administered 2018-05-04 – 2018-05-06 (×4): 20 mg via INTRAVENOUS
  Filled 2018-05-04 (×4): qty 2

## 2018-05-04 MED ORDER — IPRATROPIUM BROMIDE 0.02 % IN SOLN
0.5000 mg | Freq: Four times a day (QID) | RESPIRATORY_TRACT | Status: DC
  Administered 2018-05-04: 0.5 mg via RESPIRATORY_TRACT
  Filled 2018-05-04: qty 2.5

## 2018-05-04 MED ORDER — SODIUM CHLORIDE 0.9 % IV SOLN: 1.0000 g | Freq: Once | INTRAVENOUS | Status: DC

## 2018-05-04 MED ORDER — ADULT MULTIVITAMIN W/MINERALS CH
1.0000 | ORAL_TABLET | Freq: Every day | ORAL | Status: DC
  Administered 2018-05-04 – 2018-05-06 (×3): 1 via ORAL
  Filled 2018-05-04 (×3): qty 1

## 2018-05-04 MED ORDER — LORAZEPAM 2 MG/ML IJ SOLN: 1.0000 mg | Freq: Four times a day (QID) | INTRAMUSCULAR | Status: DC | PRN

## 2018-05-04 NOTE — ED Provider Notes (Signed)
Patient presents today for evaluation of shortness of breath and hypoxia.  I assumed care of patient from Arthor Captain, PA-C at shift change, please see her note for full H&P.   Physical Exam  BP 106/63   Pulse (!) 103   Temp 98.8 F (37.1 C) (Axillary)   Resp (!) 24   SpO2 100%   Physical Exam Vitals signs and nursing note reviewed.  Constitutional:      General: She is not in acute distress.    Appearance: She is not toxic-appearing.  HENT:     Head: Normocephalic.     Mouth/Throat:     Mouth: Mucous membranes are moist.  Neck:     Musculoskeletal: Normal range of motion.  Cardiovascular:     Rate and Rhythm: Tachycardia present.  Pulmonary:     Effort: Tachypnea present.     Breath sounds: Examination of the right-upper field reveals rhonchi. Examination of the left-upper field reveals rhonchi. Examination of the right-middle field reveals rhonchi. Examination of the left-middle field reveals rhonchi. Examination of the right-lower field reveals rhonchi. Examination of the left-lower field reveals rhonchi. Rhonchi present. No wheezing or rales.  Chest:     Chest wall: No tenderness.  Neurological:     Mental Status: She is alert.     ED Course/Procedures   Clinical Course as of May 04 1733  Mon May 04, 2018  1716 Patient reevaluated, diffuse rhonchi with cough.  Xopenex ordered, respiratory at bedside.  Albuterol is less appropriate, Xopenex was ordered given tachycardia.   [EH]  1733 Spoke with hospitalist who will admit patient.    [EH]    Clinical Course User Index [EH] Cristina Gong, PA-C    Procedures   Ct Angio Chest Pe W And/or Wo Contrast  Result Date: 05/04/2018 CLINICAL DATA:  68 year old female with a history of shortness of breath. History of prior thromboembolic disease EXAM: CT ANGIOGRAPHY CHEST WITH CONTRAST TECHNIQUE: Multidetector CT imaging of the chest was performed using the standard protocol during bolus administration of intravenous  contrast. Multiplanar CT image reconstructions and MIPs were obtained to evaluate the vascular anatomy. CONTRAST:  10mL ISOVUE-370 IOPAMIDOL (ISOVUE-370) INJECTION 76% COMPARISON:  06/25/2017 FINDINGS: Cardiovascular: Heart: Heart size unchanged with cardiomegaly. No pericardial fluid/thickening. Calcifications of the left anterior descending, circumflex coronary arteries. Aorta: Minimal atherosclerotic changes of the aortic arch. Greatest diameter of the ascending aorta measures 3.3 cm. No dissection. No periaortic fluid. Pulmonary arteries: No central, lobar, segmental, or proximal subsegmental filling defects. Main pulmonary artery measures 3.8 cm. Mediastinum/Nodes: Mediastinal lymph nodes are present, without significant enlargement. The index nodes in the lowest paratracheal station measures 8 mm-9 mm. Unremarkable appearance of the thoracic esophagus. Unremarkable thoracic inlet Lungs/Pleura: The left main stem demonstrates minimal debris, nonocclusive. The left upper lobe bronchi are patent with no significant thickening. The main bronchus to the left lower lobe is occluded at the origin, with minimal airway to the superior segments of the left lower lobe. Partial volume loss of the left lower lobe with confluent airspace opacity in the dependent aspects of the left lower lobe, which is only partially aerated. No left-sided pleural effusion. No significant interlobular septal thickening. Trace right-sided pleural effusion with associated atelectasis. Centrilobular emphysema. Upper Abdomen: No acute. Musculoskeletal: Osteopenia. Similar configuration of T6 and T7 compression fracture without significant further height loss. Similar appearance of compression fracture at the T10 level without further height loss. There Is redemonstration of T11 compression fracture, now with further height loss of  approximately 50% anterior. No fracture line identified with overall sclerotic changes. Adjacent level vacuum disc  phenomenon. New compression fracture at T12 compared to the prior with less than 30% anterior height loss. None of the fractures demonstrate posterior endplate retropulsion. Multilevel degenerative changes of the spine without bony canal narrowing. No acute displaced fracture is identified. Review of the MIP images confirms the above findings. IMPRESSION: CT is negative for acute pulmonary emboli. Obstructive debris within the main bronchus to the left lower lobe, with partial volume loss and filling of some of the airways of the left lower lobe. Given the additional debris within the left mainstem bronchus, this is favored to be related to aspiration/pneumonitis, however, obstructive lesion with postobstructive pneumonitis can not be excluded. Follow-up strategy may include repeat imaging after treatment versus referral for pulmonary evaluation and bronchoscopy. Borderline mediastinal lymph nodes, favored to be reactive given the above findings of the left lower lobe. Trace right-sided pleural effusion. Evidence of pulmonary hypertension with enlarged main pulmonary artery. Emphysema (ICD10-J43.9). Mild aortic atherosclerosis and associated coronary artery disease. Aortic Atherosclerosis (ICD10-I70.0). Osteopenia with multiple chronic compression fracture as above. Electronically Signed   By: Gilmer Mor D.O.   On: 05/04/2018 16:45   Dg Chest Port 1 View  Result Date: 05/04/2018 CLINICAL DATA:  Shortness of Breath EXAM: PORTABLE CHEST 1 VIEW COMPARISON:  08/21/2017 FINDINGS: Cardiac shadow is enlarged but stable. Aortic calcifications are again seen. The lungs are well aerated bilaterally. No focal infiltrate or sizable effusion is seen. Chronic blunting of left costophrenic angle is noted. IMPRESSION: No acute abnormality noted. Electronically Signed   By: Alcide Clever M.D.   On: 05/04/2018 12:48     MDM   Plan is to follow-up on CTA, shortness of breath, anticipate admission.  CTA does not show  evidence of PE.  There are multiple chronic compression fractures with no acute fractures identified.  Negative for PE however there is obstructive debris within the main bronchus to the left lower lobe with partial volume loss in feeling of some of the airways related to aspiration/pneumonitis.  Chart review performed, patient has tolerated Rocephin multiple times in the past.  Will treat with rocephin.    CTA shows concern for obstructive debris in the left lower lobe with partial volume loss and concern for debris (non obstructive) in the left mainstem.    I spoke with the hospitalist who agreed to admit patient.        Cristina Gong, PA-C 05/04/18 1735    Vanetta Mulders, MD 05/05/18 [phone]

## 2018-05-04 NOTE — H&P (Signed)
History and Physical  Rachel Williams:[id number] DOB: 14-Nov-1950 DOA: 05/04/2018  Referring physician:  PCP: Sigmund Hazel, MD  Outpatient Specialists: Awaiting eye surgery, as well as endocrinology follow-up for hyperthyroidism Patient coming from: Home  Chief Complaint: Respiratory distress  HPI: Patient is a 68 year old female, quite cachectic, with past medical history significant for alcohol abuse, tobacco abuse, reported COPD, pulmonary hypertension, diastolic congestive heart failure and pneumonia.  Patient was recently found to have significantly low/non-detectable TSH, with associated severe weight loss of about 55 pounds over 6 weeks.  Following recent fall, patient's left lower extremity was splinted.  Patient is awaiting endocrinology follow-up.  Apparently, patient was scheduled to have eye surgery sometime last week but was found to have significant tachycardia.  Patient diuretics were discontinued, and patient was started on Coreg.  Following discontinuation of diuretics, patient developed progressive edema.  Patient developed respiratory distress earlier today, and was said to have O2 sat of 60% when patient was seen by the EMS.  On presentation to the ER, patient remained in significant respiratory distress, was placed on BiPAP, tachycardic undetectable TSH was noted.  CTA of the chest done was negative for pulmonary embolism, but revealed possible aspiration/aspiration pneumonitis versus possible obstruction membra course of the left lower lobe.  Trace right-sided pleural effusion was also reported.  No associated fever or chills.  Patient tells me that last alcohol use was yesterday.  No headache, no neck pain, no chest pain, no GI symptoms and no urinary symptoms.  Albumin is 2.7, with cardiac BNP of 2819 with lactic acid of 2.8.  ABG revealed 7.39, PCO2 of 44.9 and PCO2 of 459.  Serum creatinine is 0.79, total bilirubin of 1.9.  Point-of-care troponin was 0.05.  WBC 7.4 with hemoglobin  of 11.9 hematocrit of 37.2.  Patient be admitted for further assessment and management.  ED Course: Patient was in significant respiratory distress on presentation to the ER, was tachycardic.  Patient was placed on BiPAP.  Initial work-up was done.  CT of the chest is negative for pulmonary embolism.  Hospitalist service has been asked to admit patient for further assessment and management Pertinent labs: As above EKG: Independently reviewed.  Imaging: independently reviewed.   Review of Systems:  Negative for fever, visual changes, sore throat, rash, new muscle aches, chest pain, dysuria, bleeding, n/v/abdominal pain.  Past Medical History:  Diagnosis Date  . Acute CHF (congestive heart failure) (HCC) 06/24/2017  . ETOH abuse   . Pneumonia     Past Surgical History:  Procedure Laterality Date  . COLON RESECTION     perforation  . COLOSTOMY    . IR IMAGE GUIDED DRAINAGE PERCUT CATH  PERITONEAL RETROPERIT  08/15/2017  . IR PARACENTESIS  08/15/2017  . IR PARACENTESIS  08/20/2017  . IR PARACENTESIS  08/26/2017     reports that she has been smoking cigarettes. She has a 50.00 pack-year smoking history. She has never used smokeless tobacco. She reports current alcohol use of about 14.0 standard drinks of alcohol per week. She reports that she does not use drugs.  Allergies  Allergen Reactions  . Ciprofloxacin Rash  . Levofloxacin Rash    Rash on back after 5 days therapy.   . Nickel Rash    Anything with nickel in it.   Marland Kitchen Penicillins Rash    Tolerates cephalosporins   Has patient had a PCN reaction causing immediate rash, facial/tongue/throat swelling, SOB or lightheadedness with hypotension: No Has patient had a PCN reaction causing  severe rash involving mucus membranes or skin necrosis: No Ha patient had a PCN reaction that required hospitalization No Has patient had a PCN reaction occurring within the last 10 years: No If all of the above answers are "NO", then may proceed with:  Cephalosporins       Family History  Problem Relation Age of Onset  . Alzheimer's disease Mother   . Cancer Sister   . Cirrhosis Father   . Hemochromatosis Other      Prior to Admission medications   Medication Sig Start Date End Date Taking? Authorizing Provider  folic acid (FOLVITE) 1 MG tablet Take 1 tablet (1 mg total) by mouth daily. 06/26/17  Yes Burnadette Pop, MD  spironolactone (ALDACTONE) 25 MG tablet Take 0.5 tablets (12.5 mg total) by mouth daily. 07/30/17 05/04/18 Yes Bensimhon, Bevelyn Buckles, MD  furosemide (LASIX) 40 MG tablet Take 1 tablet (40 mg total) by mouth daily. 06/27/17   Burnadette Pop, MD  nicotine (NICODERM CQ - DOSED IN MG/24 HOURS) 14 mg/24hr patch Place 1 patch (14 mg total) onto the skin daily. Patient not taking: Reported on 05/04/2018 08/30/17   Darlin Drop, DO  thiamine 100 MG tablet Take 1 tablet (100 mg total) by mouth daily. Patient not taking: Reported on 07/30/2017 06/26/17   Burnadette Pop, MD    Physical Exam: Vitals:   05/04/18 1630 05/04/18 1700 05/04/18 1718 05/04/18 1730  BP: 121/70 116/73  109/73  Pulse: 93 (!) 106  (!) 102  Resp: (!) 25   17  Temp:      TempSrc:      SpO2: 100% 100% 100% 100%   Constitutional:  . Appears calm and comfortable.  Cachectic.  Mild proptosis.  Left lower extremity splint. Eyes:  . Pallor.    ENMT:  . external ears, nose appear normal Neck:  . Neck is supple.  JVD is equivocal.   Respiratory:  . Decreased air entry globally.  No wheezes appreciated.    Marland Kitchen Respiratory effort normal. No retractions or accessory muscle use Cardiovascular:  . S1S2, mild tachycardia. . Bilateral lower extremity edema, worse on the left side. Abdomen:  . Abdomen is flat, soft and non tender.  No organomegaly appreciated.   Neurologic:  . Awake and alert. . Moves all limbs.  Wt Readings from Last 3 Encounters:  08/15/17 46.7 kg  07/30/17 50.7 kg  06/26/17 59.5 kg    I have personally reviewed following labs and  imaging studies  Labs on Admission:  CBC: Recent Labs  Lab 05/04/18 1235 05/04/18 1240  WBC 7.4  --   NEUTROABS 5.7  --   HGB 11.9* 10.5*  HCT 37.2 31.0*  MCV 87.9  --   PLT 171  --    Basic Metabolic Panel: Recent Labs  Lab 05/04/18 1235 05/04/18 1240 05/04/18 1252  NA 136 135  --   K 4.5 4.5  --   CL 98  --   --   CO2 23  --   --   GLUCOSE 79  --   --   BUN 16  --   --   CREATININE 0.79  --  0.60  CALCIUM 9.3  --   --    Liver Function Tests: Recent Labs  Lab 05/04/18 1235  AST 54*  ALT 32  ALKPHOS 173*  BILITOT 1.9*  PROT 6.2*  ALBUMIN 2.7*   No results for input(s): LIPASE, AMYLASE in the last 168 hours. No results for input(s): AMMONIA in  the last 168 hours. Coagulation Profile: No results for input(s): INR, PROTIME in the last 168 hours. Cardiac Enzymes: No results for input(s): CKTOTAL, CKMB, CKMBINDEX, TROPONINI in the last 168 hours. BNP (last 3 results) No results for input(s): PROBNP in the last 8760 hours. HbA1C: No results for input(s): HGBA1C in the last 72 hours. CBG: No results for input(s): GLUCAP in the last 168 hours. Lipid Profile: No results for input(s): CHOL, HDL, LDLCALC, TRIG, CHOLHDL, LDLDIRECT in the last 72 hours. Thyroid Function Tests: Recent Labs    05/04/18 1437  TSH <0.010*   Anemia Panel: No results for input(s): VITAMINB12, FOLATE, FERRITIN, TIBC, IRON, RETICCTPCT in the last 72 hours. Urine analysis:    Component Value Date/Time   COLORURINE YELLOW 08/25/2017 1010   APPEARANCEUR HAZY (A) 08/25/2017 1010   LABSPEC 1.014 08/25/2017 1010   PHURINE 5.0 08/25/2017 1010   GLUCOSEU NEGATIVE 08/25/2017 1010   HGBUR SMALL (A) 08/25/2017 1010   BILIRUBINUR NEGATIVE 08/25/2017 1010   KETONESUR NEGATIVE 08/25/2017 1010   PROTEINUR NEGATIVE 08/25/2017 1010   UROBILINOGEN 1.0 01/11/2010 1611   NITRITE NEGATIVE 08/25/2017 1010   LEUKOCYTESUR NEGATIVE 08/25/2017 1010   Sepsis  Labs: @LABRCNTIP (procalcitonin:4,lacticidven:4) )No results found for this or any previous visit (from the past 240 hour(s)).    Radiological Exams on Admission: Ct Angio Chest Pe W And/or Wo Contrast  Result Date: 05/04/2018 CLINICAL DATA:  68 year old female with a history of shortness of breath. History of prior thromboembolic disease EXAM: CT ANGIOGRAPHY CHEST WITH CONTRAST TECHNIQUE: Multidetector CT imaging of the chest was performed using the standard protocol during bolus administration of intravenous contrast. Multiplanar CT image reconstructions and MIPs were obtained to evaluate the vascular anatomy. CONTRAST:  55mL ISOVUE-370 IOPAMIDOL (ISOVUE-370) INJECTION 76% COMPARISON:  06/25/2017 FINDINGS: Cardiovascular: Heart: Heart size unchanged with cardiomegaly. No pericardial fluid/thickening. Calcifications of the left anterior descending, circumflex coronary arteries. Aorta: Minimal atherosclerotic changes of the aortic arch. Greatest diameter of the ascending aorta measures 3.3 cm. No dissection. No periaortic fluid. Pulmonary arteries: No central, lobar, segmental, or proximal subsegmental filling defects. Main pulmonary artery measures 3.8 cm. Mediastinum/Nodes: Mediastinal lymph nodes are present, without significant enlargement. The index nodes in the lowest paratracheal station measures 8 mm-9 mm. Unremarkable appearance of the thoracic esophagus. Unremarkable thoracic inlet Lungs/Pleura: The left main stem demonstrates minimal debris, nonocclusive. The left upper lobe bronchi are patent with no significant thickening. The main bronchus to the left lower lobe is occluded at the origin, with minimal airway to the superior segments of the left lower lobe. Partial volume loss of the left lower lobe with confluent airspace opacity in the dependent aspects of the left lower lobe, which is only partially aerated. No left-sided pleural effusion. No significant interlobular septal thickening. Trace  right-sided pleural effusion with associated atelectasis. Centrilobular emphysema. Upper Abdomen: No acute. Musculoskeletal: Osteopenia. Similar configuration of T6 and T7 compression fracture without significant further height loss. Similar appearance of compression fracture at the T10 level without further height loss. There Is redemonstration of T11 compression fracture, now with further height loss of approximately 50% anterior. No fracture line identified with overall sclerotic changes. Adjacent level vacuum disc phenomenon. New compression fracture at T12 compared to the prior with less than 30% anterior height loss. None of the fractures demonstrate posterior endplate retropulsion. Multilevel degenerative changes of the spine without bony canal narrowing. No acute displaced fracture is identified. Review of the MIP images confirms the above findings. IMPRESSION: CT is negative for acute pulmonary  emboli. Obstructive debris within the main bronchus to the left lower lobe, with partial volume loss and filling of some of the airways of the left lower lobe. Given the additional debris within the left mainstem bronchus, this is favored to be related to aspiration/pneumonitis, however, obstructive lesion with postobstructive pneumonitis can not be excluded. Follow-up strategy may include repeat imaging after treatment versus referral for pulmonary evaluation and bronchoscopy. Borderline mediastinal lymph nodes, favored to be reactive given the above findings of the left lower lobe. Trace right-sided pleural effusion. Evidence of pulmonary hypertension with enlarged main pulmonary artery. Emphysema (ICD10-J43.9). Mild aortic atherosclerosis and associated coronary artery disease. Aortic Atherosclerosis (ICD10-I70.0). Osteopenia with multiple chronic compression fracture as above. Electronically Signed   By: Gilmer MorJaime  Wagner D.O.   On: 05/04/2018 16:45   Dg Chest Port 1 View  Result Date: 05/04/2018 CLINICAL DATA:   Shortness of Breath EXAM: PORTABLE CHEST 1 VIEW COMPARISON:  08/21/2017 FINDINGS: Cardiac shadow is enlarged but stable. Aortic calcifications are again seen. The lungs are well aerated bilaterally. No focal infiltrate or sizable effusion is seen. Chronic blunting of left costophrenic angle is noted. IMPRESSION: No acute abnormality noted. Electronically Signed   By: Alcide CleverMark  Lukens M.D.   On: 05/04/2018 12:48    EKG: Independently reviewed.   Active Problems:   Acute respiratory failure with hypoxia (HCC)   Assessment/Plan Acute respiratory distress/acute on chronic respiratory failure, hypoxic: Cannot rule out mucous plug Debris is noted member.  Because of the left lower lung lobe. -Possible acute on chronic diastolic CHF -Possible related to pulmonary hypertension (echo done 07/03/2017 revealed normal EF with PA peak pressure of 76 mmHg) -Patient was on BiPAP, but now on supplemental oxygen via nasal cannula. -Last ABG was nonrevealing.  Query FiO2. -IV Solu-Medrol -IV Lasix -Beta-blockers (monitor blood pressure closely) -Nebulizer treatment -Sputum and blood culture -Aspiration precaution -Consult speech therapy to evaluate patient -IV antibiotics  Further management will depend on hospital course.  Possible acute on chronic diastolic congestive heart failure: Beta-blocker Diuresis Echocardiogram.  Hyperthyroidism: Check free T4 Start methimazole Already on steroids as documented above Beta-blocker Follow with endocrinology,, have a low threshold to consult endocrinology while patient is inpatient  Pulmonary hypertension: Echocardiogram. Further management will depend on hospital course.  Alcohol abuse/dependence syndrome: CIWA Protocol Thiamine Folate Conseled  Tobacco abuse: Counseled to quit. Nicotine patch.  Weight loss/cachexia/severe PEM (protein energy malnutrition): Suspect role of hyperthyroidism Treat hyperthyroidism Dietitian consult Consider  calorie count  DVT prophylaxis: Subcu Lovenox Code Status: Full Family Communication: Daughter Disposition Plan: Home eventually Consults called: None Admission status: Inpatient   Severity of illness: Patient is a 68 year old female with multiple comorbidities.  Patient presents with respiratory distress, severe cachexia, tachycardia and hyperthyroidism.  Severity of patient's illness The patient needs to be treated on an inpatient basis.  Patient is at risk of further deterioration if not managed on an inpatient basis.  Time spent: 65 minutes.    Berton MountSylvester Carina Chaplin, MD  [facility] Pager #: [phone] 7PM-7AM contact night coverage as above  05/04/2018, 6:14 PM

## 2018-05-04 NOTE — Progress Notes (Signed)
Pharmacy Antibiotic Note  Rachel Williams is a 68 y.o. female admitted on 05/04/2018 with pneumonia.  Pharmacy has been consulted for Cefepime dosing.  Plan: Cefepime 2 gm x 1, then 1 gram IV q8hr Monitor renal function, C&S  Weight: 94 lb (42.6 kg)  Temp (24hrs), Avg:98.8 F (37.1 C), Min:98.8 F (37.1 C), Max:98.8 F (37.1 C)  Recent Labs  Lab 05/04/18 1235 05/04/18 1252  WBC 7.4  --   CREATININE 0.79 0.60  LATICACIDVEN 2.8*  --     CrCl cannot be calculated (Unknown ideal weight.).    Allergies  Allergen Reactions  . Ciprofloxacin Rash  . Levofloxacin Rash    Rash on back after 5 days therapy.   . Nickel Rash    Anything with nickel in it.   Marland Kitchen Penicillins Rash    Tolerates cephalosporins   Has patient had a PCN reaction causing immediate rash, facial/tongue/throat swelling, SOB or lightheadedness with hypotension: No Has patient had a PCN reaction causing severe rash involving mucus membranes or skin necrosis: No Ha patient had a PCN reaction that required hospitalization No Has patient had a PCN reaction occurring within the last 10 years: No If all of the above answers are "NO", then may proceed with: Cephalosporins       Antimicrobials this admission: Cefepime 1/27 >>   Thank you for allowing pharmacy to be a part of this patient's care.  Jeanella Cara, PharmD, [facility] Clinical Pharmacist Please see AMION for all Pharmacists' Contact Phone Numbers 05/04/2018, 7:29 PM

## 2018-05-04 NOTE — ED Provider Notes (Addendum)
[facility] EMERGENCY DEPARTMENT Provider Note   CSN: [id number] Arrival date & time: 05/04/18  1213     History   Chief Complaint Chief Complaint  Patient presents with  . Shortness of Breath    HPI Rachel Williams is a 68 y.o. female.  Who presents the emergency department in respiratory distress.  History is given by EMS, review of EMR, and the patient's daughter who is at bedside.  Patient is able to time in however there is a level 5 caveat due to respiratory distress and level of acuity.  Patient has a history of recently diagnosed hyperthyroidism, tachycardia, CHF, left leg fracture, current daily smoking, and history of alcohol abuse.  Patient states that she began having sudden onset shortness of breath starting last night.  She lives at an assisted living facility and was noted to be in respiratory distress this morning.  EMS was called and states that when they arrived she had oxygen saturation in the 60s and was very labored, unable to complete sentences.  She was placed on CPAP.  Daughter states that she needs cataract surgery and she has had some medication changes recently.  She relates that the patient was noted to be tachycardic at her ophthalmologist's office where she was found to have a TSH of 0.  Daughter states that her cardiologist began to wean her off of her Lasix and that apparently the patient and her daughter misunderstood and also took her off of her spironolactone.  Her daughter thinks that this is all due to pulmonary edema as a cause of her shortness of breath.  Patient does not take any blood thinners.  She has a history of atrial fibrillation.  Is unclear if she is on any medication for her thyroid.  Patient has been less mobile secondary to a cast on the left leg.  HPI  Past Medical History:  Diagnosis Date  . Acute CHF (congestive heart failure) (HCC) 06/24/2017  . ETOH abuse   . Pneumonia     Patient Active Problem List   Diagnosis Date  Noted  . Acute respiratory failure with hypoxia (HCC) 05/04/2018  . Abdominal pain   . Ascites   . MRSA infection   . Bacterial peritonitis (HCC)   . Protein-calorie malnutrition, severe 08/26/2017  . Hepatic cirrhosis (HCC) 08/19/2017  . Intra-abdominal abscess s/p perc drainage 08/15/2017 08/19/2017  . Acute respiratory failure (HCC)   . Pleural effusion   . Severe sepsis (HCC) 08/15/2017  . Chronic diastolic CHF (congestive heart failure) (HCC) 08/15/2017  . Pulmonary hypertension (HCC) 06/26/2017  . CHF (congestive heart failure) (HCC) 06/25/2017  . Acute CHF (congestive heart failure) (HCC) 06/24/2017  . Right middle lobe pulmonary infiltrate 06/24/2017  . Sepsis, unspecified organism (HCC) 05/21/2016  . Elevated LFTs 05/21/2016  . Macrocytic anemia 05/21/2016  . Influenza A 05/21/2016  . Malnutrition of moderate degree 05/29/2015  . NSVT (nonsustained ventricular tachycardia) (HCC) 05/29/2015  . Hyponatremia 05/27/2015  . Recurrent falls 05/27/2015  . Facial bruising 05/27/2015  . Hypomagnesemia 05/27/2015  . Hypokalemia 05/27/2015  . Alcohol use (HCC), daily 2-3 drinks of vodka 05/27/2015  . Smoker 05/27/2015  . Dehydration 05/27/2015  . Vertebral compression fracture (HCC) 05/27/2015  . Acute pharyngitis 05/27/2015  . Chronic constipation 05/27/2015  . Generalized weakness     Past Surgical History:  Procedure Laterality Date  . COLON RESECTION     perforation  . COLOSTOMY    . IR IMAGE GUIDED DRAINAGE PERCUT CATH  PERITONEAL RETROPERIT  08/15/2017  . IR PARACENTESIS  08/15/2017  . IR PARACENTESIS  08/20/2017  . IR PARACENTESIS  08/26/2017     OB History   No obstetric history on file.      Home Medications    Prior to Admission medications   Medication Sig Start Date End Date Taking? Authorizing Provider  folic acid (FOLVITE) 1 MG tablet Take 1 tablet (1 mg total) by mouth daily. 06/26/17  Yes Burnadette Pop, MD  spironolactone (ALDACTONE) 25 MG tablet  Take 0.5 tablets (12.5 mg total) by mouth daily. 07/30/17 05/04/18 Yes Bensimhon, Bevelyn Buckles, MD  furosemide (LASIX) 40 MG tablet Take 1 tablet (40 mg total) by mouth daily. 06/27/17   Burnadette Pop, MD  nicotine (NICODERM CQ - DOSED IN MG/24 HOURS) 14 mg/24hr patch Place 1 patch (14 mg total) onto the skin daily. Patient not taking: Reported on 05/04/2018 08/30/17   Darlin Drop, DO  thiamine 100 MG tablet Take 1 tablet (100 mg total) by mouth daily. Patient not taking: Reported on 07/30/2017 06/26/17   Burnadette Pop, MD    Family History Family History  Problem Relation Age of Onset  . Alzheimer's disease Mother   . Cancer Sister   . Cirrhosis Father   . Hemochromatosis Other     Social History Social History   Tobacco Use  . Smoking status: Current Every Day Smoker    Packs/day: 1.00    Years: 50.00    Pack years: 50.00    Types: Cigarettes  . Smokeless tobacco: Never Used  Substance Use Topics  . Alcohol use: Yes    Alcohol/week: 14.0 standard drinks    Types: 14 Standard drinks or equivalent per week    Comment: daily, >6 per daughter  . Drug use: No     Allergies   Ciprofloxacin; Levofloxacin; Nickel; and Penicillins   Review of Systems Review of Systems  Unable to perform ROS: Acuity of condition     Physical Exam Updated Vital Signs BP 109/60 (BP Location: Right Leg)   Pulse (!) 110 Comment: Nurse notified.  Temp 98.3 F (36.8 C) (Oral)   Resp 16   Wt 43.2 kg   SpO2 96%   BMI 17.42 kg/m   Physical Exam Vitals signs and nursing note reviewed.  Constitutional:      General: She is in acute distress.     Appearance: She is underweight. She is ill-appearing.  Eyes:     Pupils: Pupils are equal, round, and reactive to light.  Cardiovascular:     Rate and Rhythm: Tachycardia present.     Comments: Difficult to auscultate heart sounds Pulmonary:     Effort: Tachypnea and accessory muscle usage present.     Breath sounds: Rhonchi and rales present.  No wheezing.     Comments: Bipap in place Musculoskeletal:     Right lower leg: Edema present.     Left lower leg: Edema present.     Comments: Left >Right peripheral edema Left leg in cast with markedly larger appearance  Skin:    Comments: Cold extremities  Neurological:     Mental Status: She is alert.      ED Treatments / Results  Labs (all labs ordered are listed, but only abnormal results are displayed) Labs Reviewed  LACTIC ACID, PLASMA - Abnormal; Notable for the following components:      Result Value   Lactic Acid, Venous 2.8 (*)    All other components within normal limits  COMPREHENSIVE METABOLIC PANEL - Abnormal; Notable for the following components:   Total Protein 6.2 (*)    Albumin 2.7 (*)    AST 54 (*)    Alkaline Phosphatase 173 (*)    Total Bilirubin 1.9 (*)    All other components within normal limits  CBC WITH DIFFERENTIAL/PLATELET - Abnormal; Notable for the following components:   Hemoglobin 11.9 (*)    All other components within normal limits  URINALYSIS, ROUTINE W REFLEX MICROSCOPIC - Abnormal; Notable for the following components:   Glucose, UA >=500 (*)    Leukocytes, UA TRACE (*)    All other components within normal limits  BRAIN NATRIURETIC PEPTIDE - Abnormal; Notable for the following components:   B Natriuretic Peptide 2,819.7 (*)    All other components within normal limits  D-DIMER, QUANTITATIVE (NOT AT [facility]) - Abnormal; Notable for the following components:   D-Dimer, Quant 3.46 (*)    All other components within normal limits  TSH - Abnormal; Notable for the following components:   TSH <0.010 (*)    All other components within normal limits  CBC - Abnormal; Notable for the following components:   WBC 2.8 (*)    Hemoglobin 10.5 (*)    HCT 33.6 (*)    All other components within normal limits  CREATININE, SERUM - Abnormal; Notable for the following components:   GFR calc non Af Amer 57 (*)    All other components within normal  limits  MAGNESIUM - Abnormal; Notable for the following components:   Magnesium 1.1 (*)    All other components within normal limits  TROPONIN I - Abnormal; Notable for the following components:   Troponin I 0.08 (*)    All other components within normal limits  TROPONIN I - Abnormal; Notable for the following components:   Troponin I 0.10 (*)    All other components within normal limits  BASIC METABOLIC PANEL - Abnormal; Notable for the following components:   Sodium 134 (*)    Potassium 3.1 (*)    Glucose, Bld 309 (*)    Calcium 8.7 (*)    All other components within normal limits  CBC - Abnormal; Notable for the following components:   WBC 2.5 (*)    Hemoglobin 10.5 (*)    HCT 32.2 (*)    Platelets 141 (*)    All other components within normal limits  PROTIME-INR - Abnormal; Notable for the following components:   Prothrombin Time 16.6 (*)    All other components within normal limits  HEPATIC FUNCTION PANEL - Abnormal; Notable for the following components:   Total Protein 5.4 (*)    Albumin 2.4 (*)    AST 53 (*)    Alkaline Phosphatase 138 (*)    Bilirubin, Direct 0.5 (*)    All other components within normal limits  T4, FREE - Abnormal; Notable for the following components:   Free T4 5.38 (*)    All other components within normal limits  MAGNESIUM - Abnormal; Notable for the following components:   Magnesium 1.2 (*)    All other components within normal limits  TROPONIN I - Abnormal; Notable for the following components:   Troponin I 0.11 (*)    All other components within normal limits  POCT I-STAT 7, (LYTES, BLD GAS, ICA,H+H) - Abnormal; Notable for the following components:   pO2, Arterial 459.0 (*)    HCT 31.0 (*)    Hemoglobin 10.5 (*)    All other components within  normal limits  CULTURE, BLOOD (ROUTINE X 2)  CULTURE, BLOOD (ROUTINE X 2)  MRSA PCR SCREENING  EXPECTORATED SPUTUM ASSESSMENT W REFEX TO RESP CULTURE  HIV ANTIBODY (ROUTINE TESTING W REFLEX)    PHOSPHORUS  PROCALCITONIN  T3, FREE  COMPREHENSIVE METABOLIC PANEL  CBC  MAGNESIUM  I-STAT TROPONIN, ED  I-STAT CREATININE, ED    EKG EKG Interpretation  Date/Time:  Monday May 04 2018 12:21:12 EST Ventricular Rate:  118 PR Interval:    QRS Duration: 126 QT Interval:  292 QTC Calculation: 409 R Axis:   90 Text Interpretation:  Sinus tachycardia Atrial premature complexes RBBB and LPFB ST depr, consider ischemia, inferior leads Minimal ST elevation, lateral leads No significant change since last tracing Confirmed by Alvira MondaySchlossman, Erin ([id number]) on 05/04/2018 1:33:07 PM   Radiology Ct Angio Chest Pe W And/or Wo Contrast  Result Date: 05/04/2018 CLINICAL DATA:  68 year old female with a history of shortness of breath. History of prior thromboembolic disease EXAM: CT ANGIOGRAPHY CHEST WITH CONTRAST TECHNIQUE: Multidetector CT imaging of the chest was performed using the standard protocol during bolus administration of intravenous contrast. Multiplanar CT image reconstructions and MIPs were obtained to evaluate the vascular anatomy. CONTRAST:  55mL ISOVUE-370 IOPAMIDOL (ISOVUE-370) INJECTION 76% COMPARISON:  06/25/2017 FINDINGS: Cardiovascular: Heart: Heart size unchanged with cardiomegaly. No pericardial fluid/thickening. Calcifications of the left anterior descending, circumflex coronary arteries. Aorta: Minimal atherosclerotic changes of the aortic arch. Greatest diameter of the ascending aorta measures 3.3 cm. No dissection. No periaortic fluid. Pulmonary arteries: No central, lobar, segmental, or proximal subsegmental filling defects. Main pulmonary artery measures 3.8 cm. Mediastinum/Nodes: Mediastinal lymph nodes are present, without significant enlargement. The index nodes in the lowest paratracheal station measures 8 mm-9 mm. Unremarkable appearance of the thoracic esophagus. Unremarkable thoracic inlet Lungs/Pleura: The left main stem demonstrates minimal debris, nonocclusive. The  left upper lobe bronchi are patent with no significant thickening. The main bronchus to the left lower lobe is occluded at the origin, with minimal airway to the superior segments of the left lower lobe. Partial volume loss of the left lower lobe with confluent airspace opacity in the dependent aspects of the left lower lobe, which is only partially aerated. No left-sided pleural effusion. No significant interlobular septal thickening. Trace right-sided pleural effusion with associated atelectasis. Centrilobular emphysema. Upper Abdomen: No acute. Musculoskeletal: Osteopenia. Similar configuration of T6 and T7 compression fracture without significant further height loss. Similar appearance of compression fracture at the T10 level without further height loss. There Is redemonstration of T11 compression fracture, now with further height loss of approximately 50% anterior. No fracture line identified with overall sclerotic changes. Adjacent level vacuum disc phenomenon. New compression fracture at T12 compared to the prior with less than 30% anterior height loss. None of the fractures demonstrate posterior endplate retropulsion. Multilevel degenerative changes of the spine without bony canal narrowing. No acute displaced fracture is identified. Review of the MIP images confirms the above findings. IMPRESSION: CT is negative for acute pulmonary emboli. Obstructive debris within the main bronchus to the left lower lobe, with partial volume loss and filling of some of the airways of the left lower lobe. Given the additional debris within the left mainstem bronchus, this is favored to be related to aspiration/pneumonitis, however, obstructive lesion with postobstructive pneumonitis can not be excluded. Follow-up strategy may include repeat imaging after treatment versus referral for pulmonary evaluation and bronchoscopy. Borderline mediastinal lymph nodes, favored to be reactive given the above findings of the left lower  lobe.  Trace right-sided pleural effusion. Evidence of pulmonary hypertension with enlarged main pulmonary artery. Emphysema (ICD10-J43.9). Mild aortic atherosclerosis and associated coronary artery disease. Aortic Atherosclerosis (ICD10-I70.0). Osteopenia with multiple chronic compression fracture as above. Electronically Signed   By: Gilmer Mor D.O.   On: 05/04/2018 16:45   Dg Chest Port 1 View  Result Date: 05/04/2018 CLINICAL DATA:  Shortness of Breath EXAM: PORTABLE CHEST 1 VIEW COMPARISON:  08/21/2017 FINDINGS: Cardiac shadow is enlarged but stable. Aortic calcifications are again seen. The lungs are well aerated bilaterally. No focal infiltrate or sizable effusion is seen. Chronic blunting of left costophrenic angle is noted. IMPRESSION: No acute abnormality noted. Electronically Signed   By: Alcide Clever M.D.   On: 05/04/2018 12:48    Procedures .Critical Care Performed by: Arthor Captain, PA-C Authorized by: Arthor Captain, PA-C   Critical care provider statement:    Critical care time (minutes):  50   Critical care time was exclusive of:  Separately billable procedures and treating other patients   Critical care was necessary to treat or prevent imminent or life-threatening deterioration of the following conditions:  Respiratory failure   Critical care was time spent personally by me on the following activities:  Evaluation of patient's response to treatment, examination of patient, ordering and performing treatments and interventions, ordering and review of laboratory studies, ordering and review of radiographic studies, pulse oximetry, re-evaluation of patient's condition, obtaining history from patient or surrogate, review of old charts and development of treatment plan with patient or surrogate   (including critical care time)  Medications Ordered in ED Medications  iopamidol (ISOVUE-370) 76 % injection (has no administration in time range)  spironolactone (ALDACTONE) tablet  12.5 mg (12.5 mg Oral Given 05/05/18 0937)  furosemide (LASIX) injection 20 mg (20 mg Intravenous Given 05/05/18 1737)  methimazole (TAPAZOLE) tablet 10 mg (10 mg Oral Given 05/05/18 0940)  metoprolol tartrate (LOPRESSOR) tablet 12.5 mg (12.5 mg Oral Not Given 05/05/18 1301)  methylPREDNISolone sodium succinate (SOLU-MEDROL) 40 mg/mL injection 40 mg (40 mg Intravenous Given 05/05/18 1713)  budesonide (PULMICORT) nebulizer solution 0.25 mg (0.25 mg Nebulization Given by Other 05/05/18 0812)  LORazepam (ATIVAN) tablet 1 mg (has no administration in time range)    Or  LORazepam (ATIVAN) injection 1 mg (has no administration in time range)  thiamine (VITAMIN B-1) tablet 100 mg (100 mg Oral Given 05/05/18 0939)    Or  thiamine (B-1) injection 100 mg ( Intravenous See Alternative 05/05/18 0939)  multivitamin with minerals tablet 1 tablet (1 tablet Oral Given 05/05/18 0938)  LORazepam (ATIVAN) tablet 0-4 mg (0 mg Oral Not Given 05/05/18 1301)    Followed by  LORazepam (ATIVAN) tablet 0-4 mg (has no administration in time range)  folic acid (FOLVITE) tablet 1 mg (1 mg Oral Given 05/05/18 0938)  ipratropium (ATROVENT) nebulizer solution 0.5 mg (has no administration in time range)  levalbuterol (XOPENEX) nebulizer solution 0.63 mg (has no administration in time range)  enoxaparin (LOVENOX) injection 30 mg (has no administration in time range)  metroNIDAZOLE (FLAGYL) IVPB 500 mg (500 mg Intravenous New Bag/Given 05/05/18 1818)  cefdinir (OMNICEF) capsule 300 mg (has no administration in time range)  silver sulfADIAZINE (SILVADENE) 1 % cream ( Topical Given 05/04/18 1607)  iopamidol (ISOVUE-370) 76 % injection 100 mL (55 mLs Intravenous Contrast Given 05/04/18 1626)  levalbuterol (XOPENEX) nebulizer solution 0.63 mg (0.63 mg Nebulization Given 05/04/18 1718)  cefTRIAXone (ROCEPHIN) 1 g in sodium chloride 0.9 % 100 mL IVPB (0 g Intravenous Stopped  05/04/18 1810)  ceFEPIme (MAXIPIME) 2 g in sodium chloride 0.9 % 100 mL  IVPB (2 g Intravenous New Bag/Given 05/04/18 2325)  aspirin chewable tablet 324 mg (324 mg Oral Given 05/04/18 2348)  potassium chloride SA (K-DUR,KLOR-CON) CR tablet 40 mEq (40 mEq Oral Given 05/05/18 1043)  magnesium sulfate IVPB 4 g 100 mL (4 g Intravenous New Bag/Given 05/05/18 1411)  potassium chloride SA (K-DUR,KLOR-CON) CR tablet 40 mEq (40 mEq Oral Given 05/05/18 1426)     Initial Impression / Assessment and Plan / ED Course  I have reviewed the triage vital signs and the nursing notes.  Pertinent labs & imaging results that were available during my care of the patient were reviewed by me and considered in my medical decision making (see chart for details).  Clinical Course as of May 06 1823  Mon May 04, 2018  1716 Patient reevaluated, diffuse rhonchi with cough.  Xopenex ordered, respiratory at bedside.  Albuterol is less appropriate, Xopenex was ordered given tachycardia.   [EH]  1733 Spoke with hospitalist who will admit patient.    [EH]    Clinical Course User Index [EH] Cristina GongHammond, Elizabeth W, [place]PA-C    .  68 year old female who presents in respiratory distress.  Patient placed on BiPAP with improvement in her symptoms heart lactic acid is elevated which I suspect is from hypoxia and work of breathing prior to arrival.  Patient TSH is unreasonably low which is very concerning for hyperthyroidism may be 1 of the underlying causes of her heart failure symptoms.  I do not see that the patient is on methimazole or propylthiouracil.  Patient does not appear volume overloaded but has an elevated BNP.  This is not her highest value in her previous serial BNP values.  Negative i-STAT troponin.  Her d-dimer is markedly elevated and currently has a CT angiogram of the pulmonary arteries pending.  Portable chest x-ray is negative.  EKG shows sinus tachycardia and some EKG changes that are likely secondary to demand.  I have given sign out to PA LucanHammond who will assume care of the patient.  Suspect  she will need admission. Final Clinical Impressions(s) / ED Diagnoses   Final diagnoses:  Shortness of breath    ED Discharge Orders    None       Arthor CaptainHarris, Denaly Gatling, PA-C 05/04/18 1649    Arthor CaptainHarris, Emsley Custer, PA-C 05/05/18 1825    Alvira MondaySchlossman, Erin, MD 05/05/18 2112

## 2018-05-04 NOTE — ED Notes (Signed)
Patient transported to CT 

## 2018-05-04 NOTE — ED Notes (Signed)
Respiratory on way to help transport patient to CT on Bi-PAP.

## 2018-05-04 NOTE — ED Notes (Signed)
Patient's daughter came to desk, stating patient was c/o shortness of breath again - patient scrunched up in bed. Repositioned and raised head of bed which, per patient, improved her shortness of breath.

## 2018-05-05 ENCOUNTER — Encounter (HOSPITAL_COMMUNITY): Payer: Self-pay | Admitting: General Practice

## 2018-05-05 ENCOUNTER — Inpatient Hospital Stay (HOSPITAL_COMMUNITY): Payer: Medicare Other

## 2018-05-05 ENCOUNTER — Other Ambulatory Visit: Payer: Self-pay

## 2018-05-05 DIAGNOSIS — I361 Nonrheumatic tricuspid (valve) insufficiency: Secondary | ICD-10-CM

## 2018-05-05 DIAGNOSIS — I340 Nonrheumatic mitral (valve) insufficiency: Secondary | ICD-10-CM

## 2018-05-05 LAB — CBC
HCT: 32.2 % — ABNORMAL LOW (ref 36.0–46.0)
Hemoglobin: 10.5 g/dL — ABNORMAL LOW (ref 12.0–15.0)
MCH: 27 pg (ref 26.0–34.0)
MCHC: 32.6 g/dL (ref 30.0–36.0)
MCV: 82.8 fL (ref 80.0–100.0)
Platelets: 141 10*3/uL — ABNORMAL LOW (ref 150–400)
RBC: 3.89 MIL/uL (ref 3.87–5.11)
RDW: 14 % (ref 11.5–15.5)
WBC: 2.5 10*3/uL — ABNORMAL LOW (ref 4.0–10.5)
nRBC: 0 % (ref 0.0–0.2)

## 2018-05-05 LAB — MAGNESIUM: MAGNESIUM: 1.2 mg/dL — AB (ref 1.7–2.4)

## 2018-05-05 LAB — ECHOCARDIOGRAM COMPLETE: Weight: 1523.82 oz

## 2018-05-05 LAB — BASIC METABOLIC PANEL
Anion gap: 10 (ref 5–15)
BUN: 23 mg/dL (ref 8–23)
CO2: 25 mmol/L (ref 22–32)
Calcium: 8.7 mg/dL — ABNORMAL LOW (ref 8.9–10.3)
Chloride: 99 mmol/L (ref 98–111)
Creatinine, Ser: 0.64 mg/dL (ref 0.44–1.00)
GFR calc Af Amer: >60 mL/min (ref >60)
GFR calc non Af Amer: >60 mL/min (ref >60)
Glucose, Bld: 309 mg/dL — ABNORMAL HIGH (ref 70–99)
Potassium: 3.1 mmol/L — ABNORMAL LOW (ref 3.5–5.1)
Sodium: 134 mmol/L — ABNORMAL LOW (ref 135–145)

## 2018-05-05 LAB — TROPONIN I
Troponin I: 0.1 ng/mL (ref <0.03)
Troponin I: 0.11 ng/mL (ref <0.03)

## 2018-05-05 LAB — HIV ANTIBODY (ROUTINE TESTING W REFLEX): HIV Screen 4th Generation wRfx: NONREACTIVE

## 2018-05-05 LAB — PROCALCITONIN: Procalcitonin: 0.77 ng/mL

## 2018-05-05 MED ORDER — METRONIDAZOLE IN NACL 5-0.79 MG/ML-% IV SOLN
500.0000 mg | Freq: Three times a day (TID) | INTRAVENOUS | Status: DC
  Administered 2018-05-05 – 2018-05-06 (×3): 500 mg via INTRAVENOUS
  Filled 2018-05-05 (×3): qty 100

## 2018-05-05 MED ORDER — IPRATROPIUM BROMIDE 0.02 % IN SOLN
0.5000 mg | Freq: Two times a day (BID) | RESPIRATORY_TRACT | Status: DC
  Administered 2018-05-05 – 2018-05-06 (×2): 0.5 mg via RESPIRATORY_TRACT
  Filled 2018-05-05 (×2): qty 2.5

## 2018-05-05 MED ORDER — ENOXAPARIN SODIUM 30 MG/0.3ML ~~LOC~~ SOLN
30.0000 mg | SUBCUTANEOUS | Status: DC
  Administered 2018-05-05: 30 mg via SUBCUTANEOUS
  Filled 2018-05-05: qty 0.3

## 2018-05-05 MED ORDER — LEVALBUTEROL HCL 0.63 MG/3ML IN NEBU
0.6300 mg | INHALATION_SOLUTION | Freq: Two times a day (BID) | RESPIRATORY_TRACT | Status: DC
  Administered 2018-05-05 – 2018-05-06 (×2): 0.63 mg via RESPIRATORY_TRACT
  Filled 2018-05-05 (×2): qty 3

## 2018-05-05 MED ORDER — CEFDINIR 300 MG PO CAPS
300.0000 mg | ORAL_CAPSULE | Freq: Two times a day (BID) | ORAL | Status: DC
  Administered 2018-05-05 – 2018-05-06 (×2): 300 mg via ORAL
  Filled 2018-05-05 (×2): qty 1

## 2018-05-05 MED ORDER — POTASSIUM CHLORIDE CRYS ER 20 MEQ PO TBCR
40.0000 meq | EXTENDED_RELEASE_TABLET | Freq: Once | ORAL | Status: AC
  Administered 2018-05-05: 40 meq via ORAL
  Filled 2018-05-05: qty 2

## 2018-05-05 MED ORDER — MAGNESIUM SULFATE 4 GM/100ML IV SOLN
4.0000 g | Freq: Once | INTRAVENOUS | Status: AC
  Administered 2018-05-05: 4 g via INTRAVENOUS
  Filled 2018-05-05: qty 100

## 2018-05-05 NOTE — Clinical Social Work Note (Signed)
Clinical Social Work Assessment  Patient Details  Name: Rachel Williams MRN: [id number] Date of Birth: 1950-05-06  Date of referral:  05/05/18               Reason for consult:  Discharge Planning                Permission sought to share information with:  Facility Sport and exercise psychologist, Family Supports Permission granted to share information::  Yes, Verbal Permission Granted  Name::     Ketzaly Cardella  Agency::  [facility]  Relationship::  Daughter  Contact Information:  [phone]  Housing/Transportation Living arrangements for the past 2 months:  [place] of Information:  Patient, Medical Team, Adult Children, Facility Patient Interpreter Needed:  None Criminal Activity/Legal Involvement Pertinent to Current Situation/Hospitalization:  No - Comment as needed Significant Relationships:  Adult Children Lives with:  Facility Resident Do you feel safe going back to the place where you live?  Yes Need for family participation in patient care:  Yes (Comment)  Care giving concerns:  Patient is a resident at [facility].   Social Worker assessment / plan:  CSW met with patient. Daughter at bedside. CSW introduced role and explained that discharge planning would be discussed. Patient and her daughter confirmed she is from [facility] and the plan is for her to return at discharge. No further concerns. CSW encouraged patient and her daughter to contact CSW as needed. CSW will continue to follow patient and her daughter for support and facilitate discharge back to ALF once medically stable.  Employment status:  Retired Nurse, adult PT Recommendations:  Not assessed at this time Information / Referral to community resources:  Other (Comment Required)(Plan is to return to ALF.)  Patient/Family's Response to care:  Patient and her daughter agreeable to return to ALF. Patient's daughter supportive and involved in  patient's care. Patient and her daughter appreciated social work intervention.  Patient/Family's Understanding of and Emotional Response to Diagnosis, Current Treatment, and Prognosis:  Patient and her daughter have a good understanding of the reason for admission and plan to return to ALF at discharge. Patient and her daughter appear happy with hospital care.  Emotional Assessment Appearance:  Appears stated age Attitude/Demeanor/Rapport:  Engaged, Gracious Affect (typically observed):  Accepting, Appropriate, Calm, Pleasant Orientation:  Oriented to Self, Oriented to Place, Oriented to  Time, Oriented to Situation Alcohol / Substance use:  Never Used Psych involvement (Current and /or in the community):  No (Comment)  Discharge Needs  Concerns to be addressed:  Care Coordination Readmission within the last 30 days:  No Current discharge risk:  None Barriers to Discharge:  Continued Medical Work up   Candie Chroman, LCSW 05/05/2018, 1:14 PM

## 2018-05-05 NOTE — Progress Notes (Signed)
Patient had a critical troponin of 0.08. MD was paged and placed orders for aspirin 324 mg. Will continue to monitor.

## 2018-05-05 NOTE — Progress Notes (Signed)
  Echocardiogram 2D Echocardiogram has been performed.  Gerda Diss 05/05/2018, 3:33 PM

## 2018-05-05 NOTE — Progress Notes (Addendum)
PROGRESS NOTE    Rachel Williams  ZOX:[id number] DOB: 1950-06-29 DOA: 05/04/2018 PCP: Sigmund HazelMiller, Lisa, MD   Brief Narrative: Rachel JewelsSusan M Williams is a 68 y.o. female with a history of left leg fracture, tobacco, tobacco abuse, pulmonary hypertension, diastolic heart failure. Patient presented secondary to respiratory distress thought secondary to acute heart failure.   Assessment & Plan:   Active Problems:   Acute respiratory failure with hypoxia (HCC)   Acute respiratory failure with hypoxia On room air as an outpatient. Required Bipap on admission. Now on [place] -Wean to room air.  Acute on chronic diastolic heart failure Patient was taken off of lasix as an outpatient which likely precipitated exacerbation -Continue Lasix IV -Daily weights, strict in and out  Aspiration pneumonia Debris found on CT chest. Concern for aspiration vs mucous plug. Started on antibiotics. -Switch to Cefdinir/flagyl (penicillin allergy) -Procalcitonin; if elevated, continue antibiotics  Hypomagnesemia -Replete  Elevated troponin No chest pain or abnormal EKG findings concerning for ACS. Likely demand in setting of heart failure. Transthoracic Echocardiogram pending.  Hypokalemia -Replete  Hyperthyroidism Undetectable TSH with elevated free T4. Started on methimazole on admission. Patient with outpatient endocrinology follow-up -Free T3 -Continue metoprolol  Pulmonary hypertension Severe. Transthoracic Echocardiogram pending.  Alcohol abuse -CIWA, thiamine, folate  Tobacco abuse Counseled on admission. -Continue nicotine patch  Weight loss Dietitian consulted. Likely secondary to hyperthyroidism.  History of left leg surgery Currently in immobilizer. Currently does not ambulate and travels via wheelchair   DVT prophylaxis: Lovenox Code Status:   Code Status: Full Code Family Communication: Daughter at bedside Disposition Plan: Discharge in 1-2 days once weaned to room air   Consultants:     None  Procedures:   Transthoracic Echocardiogram Pending  Antimicrobials:  Ceftriaxone  Cefepime  Cefdinir  Flagyl    Subjective: No dyspnea today. Still requiring oxygen  Objective: Vitals:   05/05/18 1100 05/05/18 1202 05/05/18 1415 05/05/18 1536  BP: (!) 94/59   109/60  Pulse: (!) 104   (!) 110  Resp:      Temp:  98.9 F (37.2 C)  98.3 F (36.8 C)  TempSrc:    Oral  SpO2: 100%  99% 96%  Weight:        Intake/Output Summary (Last 24 hours) at 05/05/2018 1541 Last data filed at 05/05/2018 1537 Gross per 24 hour  Intake 488.99 ml  Output 2000 ml  Net -1511.01 ml   Filed Weights   05/04/18 1837 05/05/18 0258  Weight: 42.6 kg 43.2 kg    Examination:  General exam: Appears calm and comfortable Respiratory system: diminished to auscultation. Respiratory effort normal. Cardiovascular system: S1 & S2 heard, RRR. +JVD No murmurs, rubs, gallops or clicks. Gastrointestinal system: Abdomen is nondistended, soft and nontender. No organomegaly or masses felt. Normal bowel sounds heard. Central nervous system: Alert and oriented. No focal neurological deficits. Extremities: No calf tenderness Skin: No cyanosis. No rashes Psychiatry: Judgement and insight appear normal. Mood & affect appropriate.     Data Reviewed: I have personally reviewed following labs and imaging studies  CBC: Recent Labs  Lab 05/04/18 1235 05/04/18 1240 05/04/18 2042 05/05/18 0417  WBC 7.4  --  2.8* 2.5*  NEUTROABS 5.7  --   --   --   HGB 11.9* 10.5* 10.5* 10.5*  HCT 37.2 31.0* 33.6* 32.2*  MCV 87.9  --  85.1 82.8  PLT 171  --  151 141*   Basic Metabolic Panel: Recent Labs  Lab 05/04/18 1235 05/04/18 1240 05/04/18  1252 05/04/18 2042 05/05/18 0417 05/05/18 1050  NA 136 135  --   --  134*  --   K 4.5 4.5  --   --  3.1*  --   CL 98  --   --   --  99  --   CO2 23  --   --   --  25  --   GLUCOSE 79  --   --   --  309*  --   BUN 16  --   --   --  23  --   CREATININE 0.79   --  0.60 1.00 0.64  --   CALCIUM 9.3  --   --   --  8.7*  --   MG  --   --   --  1.1*  --  1.2*  PHOS  --   --   --  3.4  --   --    GFR: CrCl cannot be calculated (Unknown ideal weight.). Liver Function Tests: Recent Labs  Lab 05/04/18 1235 05/04/18 2042  AST 54* 53*  ALT 32 32  ALKPHOS 173* 138*  BILITOT 1.9* 1.1  PROT 6.2* 5.4*  ALBUMIN 2.7* 2.4*   No results for input(s): LIPASE, AMYLASE in the last 168 hours. No results for input(s): AMMONIA in the last 168 hours. Coagulation Profile: Recent Labs  Lab 05/04/18 2042  INR 1.36   Cardiac Enzymes: Recent Labs  Lab 05/04/18 2042 05/04/18 2334 05/05/18 1050  TROPONINI 0.08* 0.10* 0.11*   BNP (last 3 results) No results for input(s): PROBNP in the last 8760 hours. HbA1C: No results for input(s): HGBA1C in the last 72 hours. CBG: No results for input(s): GLUCAP in the last 168 hours. Lipid Profile: No results for input(s): CHOL, HDL, LDLCALC, TRIG, CHOLHDL, LDLDIRECT in the last 72 hours. Thyroid Function Tests: Recent Labs    05/04/18 1437 05/04/18 2042  TSH <0.010*  --   FREET4  --  5.38*   Anemia Panel: No results for input(s): VITAMINB12, FOLATE, FERRITIN, TIBC, IRON, RETICCTPCT in the last 72 hours. Sepsis Labs: Recent Labs  Lab 05/04/18 1235 05/05/18 1102  PROCALCITON  --  0.77  LATICACIDVEN 2.8*  --     Recent Results (from the past 240 hour(s))  Blood Culture (routine x 2)     Status: None (Preliminary result)   Collection Time: 05/04/18 12:35 PM  Result Value Ref Range Status   Specimen Description BLOOD LEFT ANTECUBITAL  Final   Special Requests   Final    BOTTLES DRAWN AEROBIC AND ANAEROBIC Blood Culture adequate volume   Culture   Final    NO GROWTH < 24 HOURS Performed at [facility], [address] [place], [place] [address]    Report Status PENDING  Incomplete  Blood Culture (routine x 2)     Status: None (Preliminary result)   Collection Time: 05/04/18 12:40 PM  Result  Value Ref Range Status   Specimen Description BLOOD LEFT FOREARM  Final   Special Requests   Final    BOTTLES DRAWN AEROBIC AND ANAEROBIC Blood Culture adequate volume   Culture   Final    NO GROWTH < 24 HOURS Performed at [facility], [address], [place], [place] [address]    Report Status PENDING  Incomplete  MRSA PCR Screening     Status: None   Collection Time: 05/04/18  9:42 PM  Result Value Ref Range Status   MRSA by PCR  NEGATIVE NEGATIVE Final    Comment:        The GeneXpert MRSA Assay (FDA approved for NASAL specimens only), is one component of a comprehensive MRSA colonization surveillance program. It is not intended to diagnose MRSA infection nor to guide or monitor treatment for MRSA infections. Performed at [facility], [address], [place], [place] [address]          Radiology Studies: Ct Angio Chest Pe W And/or Wo Contrast  Result Date: 05/04/2018 CLINICAL DATA:  68 year old female with a history of shortness of breath. History of prior thromboembolic disease EXAM: CT ANGIOGRAPHY CHEST WITH CONTRAST TECHNIQUE: Multidetector CT imaging of the chest was performed using the standard protocol during bolus administration of intravenous contrast. Multiplanar CT image reconstructions and MIPs were obtained to evaluate the vascular anatomy. CONTRAST:  36mL ISOVUE-370 IOPAMIDOL (ISOVUE-370) INJECTION 76% COMPARISON:  06/25/2017 FINDINGS: Cardiovascular: Heart: Heart size unchanged with cardiomegaly. No pericardial fluid/thickening. Calcifications of the left anterior descending, circumflex coronary arteries. Aorta: Minimal atherosclerotic changes of the aortic arch. Greatest diameter of the ascending aorta measures 3.3 cm. No dissection. No periaortic fluid. Pulmonary arteries: No central, lobar, segmental, or proximal subsegmental filling defects. Main pulmonary artery measures 3.8 cm. Mediastinum/Nodes: Mediastinal lymph nodes are present, without  significant enlargement. The index nodes in the lowest paratracheal station measures 8 mm-9 mm. Unremarkable appearance of the thoracic esophagus. Unremarkable thoracic inlet Lungs/Pleura: The left main stem demonstrates minimal debris, nonocclusive. The left upper lobe bronchi are patent with no significant thickening. The main bronchus to the left lower lobe is occluded at the origin, with minimal airway to the superior segments of the left lower lobe. Partial volume loss of the left lower lobe with confluent airspace opacity in the dependent aspects of the left lower lobe, which is only partially aerated. No left-sided pleural effusion. No significant interlobular septal thickening. Trace right-sided pleural effusion with associated atelectasis. Centrilobular emphysema. Upper Abdomen: No acute. Musculoskeletal: Osteopenia. Similar configuration of T6 and T7 compression fracture without significant further height loss. Similar appearance of compression fracture at the T10 level without further height loss. There Is redemonstration of T11 compression fracture, now with further height loss of approximately 50% anterior. No fracture line identified with overall sclerotic changes. Adjacent level vacuum disc phenomenon. New compression fracture at T12 compared to the prior with less than 30% anterior height loss. None of the fractures demonstrate posterior endplate retropulsion. Multilevel degenerative changes of the spine without bony canal narrowing. No acute displaced fracture is identified. Review of the MIP images confirms the above findings. IMPRESSION: CT is negative for acute pulmonary emboli. Obstructive debris within the main bronchus to the left lower lobe, with partial volume loss and filling of some of the airways of the left lower lobe. Given the additional debris within the left mainstem bronchus, this is favored to be related to aspiration/pneumonitis, however, obstructive lesion with postobstructive  pneumonitis can not be excluded. Follow-up strategy may include repeat imaging after treatment versus referral for pulmonary evaluation and bronchoscopy. Borderline mediastinal lymph nodes, favored to be reactive given the above findings of the left lower lobe. Trace right-sided pleural effusion. Evidence of pulmonary hypertension with enlarged main pulmonary artery. Emphysema (ICD10-J43.9). Mild aortic atherosclerosis and associated coronary artery disease. Aortic Atherosclerosis (ICD10-I70.0). Osteopenia with multiple chronic compression fracture as above. Electronically Signed   By: Gilmer Mor D.O.   On: 05/04/2018 16:45   Dg Chest Port 1 View  Result Date: 05/04/2018 CLINICAL DATA:  Shortness  of Breath EXAM: PORTABLE CHEST 1 VIEW COMPARISON:  08/21/2017 FINDINGS: Cardiac shadow is enlarged but stable. Aortic calcifications are again seen. The lungs are well aerated bilaterally. No focal infiltrate or sizable effusion is seen. Chronic blunting of left costophrenic angle is noted. IMPRESSION: No acute abnormality noted. Electronically Signed   By: Alcide Clever M.D.   On: 05/04/2018 12:48        Scheduled Meds: . budesonide (PULMICORT) nebulizer solution  0.25 mg Nebulization BID  . enoxaparin (LOVENOX) injection  30 mg Subcutaneous Q24H  . folic acid  1 mg Oral Daily  . furosemide  20 mg Intravenous BID  . ipratropium  0.5 mg Nebulization BID  . levalbuterol  0.63 mg Nebulization BID  . LORazepam  0-4 mg Oral Q6H   Followed by  . [START ON 05/06/2018] LORazepam  0-4 mg Oral Q12H  . methimazole  10 mg Oral BID  . methylPREDNISolone (SOLU-MEDROL) injection  40 mg Intravenous Q6H  . metoprolol tartrate  12.5 mg Oral Q8H  . multivitamin with minerals  1 tablet Oral Daily  . spironolactone  12.5 mg Oral Daily  . thiamine  100 mg Oral Daily   Or  . thiamine  100 mg Intravenous Daily   Continuous Infusions: . ceFEPime (MAXIPIME) IV 1 g (05/05/18 1217)  . magnesium sulfate 1 - 4 g bolus  IVPB 4 g (05/05/18 1411)     LOS: 1 day     Jacquelin Hawking, MD [facility] 05/05/2018, 3:41 PM  If 7PM-7AM, please contact night-coverage [email or web address]

## 2018-05-05 NOTE — Procedures (Signed)
Echo attempted. Patient with provider. Will attempt later.

## 2018-05-05 NOTE — Progress Notes (Signed)
Inpatient Diabetes Program Recommendations  AACE/ADA: New Consensus Statement on Inpatient Glycemic Control  Target Ranges:  Prepandial:   less than 140 mg/dL      Peak postprandial:   less than 180 mg/dL (1-2 hours)      Critically ill patients:  140 - 180 mg/dL   Results for Rachel Williams, Rachel Williams (MRN [id number]) as of 05/05/2018 11:51  Ref. Range 05/04/2018 12:35 05/05/2018 04:17  Glucose Latest Ref Range: 70 - 99 mg/dL 79 660 (H)   Review of Glycemic Control  Diabetes history: No Outpatient Diabetes medications: NA Current orders for Inpatient glycemic control: None; Solumedrol 40 mg Q6H  Inpatient Diabetes Program Recommendations: Correction (SSI): While inpatient and ordered steroids, please consider ordering CBGs with Novolog correction scale ACHS.  Thanks, Orlando Penner, RN, MSN, CDE Diabetes Coordinator Inpatient Diabetes Program [phone] (Team Pager from 8am to 5pm)

## 2018-05-05 NOTE — Plan of Care (Signed)
  Problem: Education: Goal: Knowledge of General Education information will improve Description Including pain rating scale, medication(s)/side effects and non-pharmacologic comfort measures Outcome: Progressing   Problem: Activity: Goal: Risk for activity intolerance will decrease Outcome: Progressing   Problem: Nutrition: Goal: Adequate nutrition will be maintained Outcome: Progressing   Problem: Coping: Goal: Level of anxiety will decrease Outcome: Progressing   Problem: Safety: Goal: Ability to remain free from injury will improve Outcome: Progressing   Problem: Education: Goal: Ability to demonstrate management of disease process will improve Outcome: Progressing

## 2018-05-05 NOTE — Evaluation (Signed)
Clinical/Bedside Swallow Evaluation Patient Details  Name: Rachel Williams MRN: [id number] Date of Birth: 1950-06-26  Today's Date: 05/05/2018 Time: SLP Start Time (ACUTE ONLY): 1340 SLP Stop Time (ACUTE ONLY): 1354 SLP Time Calculation (min) (ACUTE ONLY): 14 min  Past Medical History:  Past Medical History:  Diagnosis Date  . Acute CHF (congestive heart failure) (HCC) 06/24/2017  . ETOH abuse   . Pneumonia    Past Surgical History:  Past Surgical History:  Procedure Laterality Date  . COLON RESECTION     perforation  . COLOSTOMY    . IR IMAGE GUIDED DRAINAGE PERCUT CATH  PERITONEAL RETROPERIT  08/15/2017  . IR PARACENTESIS  08/15/2017  . IR PARACENTESIS  08/20/2017  . IR PARACENTESIS  08/26/2017   HPI:  Pt is a 68 yo female admitted with acute respiratory distress initially requiring BiPAP. CXR without acute changes but CTA showed obstructive debris within the main bronchus to the LLL concerning for aspiration. Previous BSE in February 2018 recommended regular diet and thin liquids but no straw. PMH includes: alcohol abuse, tobacco abuse, reported COPD, pulmonary hypertension, diastolic congestive heart failure and pneumonia.    Assessment / Plan / Recommendation Clinical Impression  Pt has no overt signs of aspiration, although she does have multiple swallows that appear to be from piecemeal swallowing. Concern for aspiration from imaging and potential for silent aspiration were reviewed with pt and her daughter, but they are not interested in instrumental testing at this time. They report that she may have had an episode a few days ago, at which time pt said something felt like it went down the wrong way, but that this was an isolated event. Her daughter also believes that the pt may not have been sitting fully upright for intake since she broke her leg. Aspiration precautions were reviewed, as well as potential complications that can arise from aspiration. At this time, they politely  decline further work up. SLP to sign off acutely - please reorder if needed. SLP Visit Diagnosis: Dysphagia, unspecified (R13.10)    Aspiration Risk  Mild aspiration risk    Diet Recommendation Regular;Thin liquid   Liquid Administration via: Cup;Straw Medication Administration: Whole meds with liquid Supervision: Patient able to self feed;Intermittent supervision to cue for compensatory strategies Compensations: Slow rate;Small sips/bites Postural Changes: Seated upright at 90 degrees    Other  Recommendations Oral Care Recommendations: Oral care BID   Follow up Recommendations None      Frequency and Duration            Prognosis        Swallow Study   General HPI: Pt is a 68 yo female admitted with acute respiratory distress initially requiring BiPAP. CXR without acute changes but CTA showed obstructive debris within the main bronchus to the LLL concerning for aspiration. Previous BSE in February 2018 recommended regular diet and thin liquids but no straw. PMH includes: alcohol abuse, tobacco abuse, reported COPD, pulmonary hypertension, diastolic congestive heart failure and pneumonia.  Type of Study: Bedside Swallow Evaluation Previous Swallow Assessment: see HPI Diet Prior to this Study: Regular;Thin liquids Temperature Spikes Noted: No Respiratory Status: Nasal cannula History of Recent Intubation: No Behavior/Cognition: Alert;Cooperative Oral Cavity Assessment: Within Functional Limits Oral Care Completed by SLP: No Oral Cavity - Dentition: Adequate natural dentition Vision: Functional for self-feeding Self-Feeding Abilities: Able to feed self Patient Positioning: Upright in bed Baseline Vocal Quality: Normal Volitional Cough: Strong;Congested Volitional Swallow: Able to elicit    Oral/Motor/Sensory Function Overall  Oral Motor/Sensory Function: Within functional limits   Ice Chips Ice chips: Not tested   Thin Liquid Thin Liquid: Impaired Presentation: Self  Fed;Straw Oral Phase Functional Implications: Other (comment)(suspect piecemeal swallowing) Pharyngeal  Phase Impairments: Multiple swallows    Nectar Thick Nectar Thick Liquid: Not tested   Honey Thick Honey Thick Liquid: Not tested   Puree Puree: Within functional limits Presentation: Self Fed;Spoon   Solid     Solid: Within functional limits Presentation: Self Primus Bravo Jazyah Butsch 05/05/2018,2:08 PM   Natalia Leatherwood, M.A. CCC-SLP Acute Herbalist [phone] Office [phone]

## 2018-05-06 ENCOUNTER — Encounter (HOSPITAL_COMMUNITY): Payer: Self-pay | Admitting: Internal Medicine

## 2018-05-06 LAB — COMPREHENSIVE METABOLIC PANEL
ALT: 26 U/L (ref 0–44)
AST: 29 U/L (ref 15–41)
Albumin: 2.4 g/dL — ABNORMAL LOW (ref 3.5–5.0)
Alkaline Phosphatase: 129 U/L — ABNORMAL HIGH (ref 38–126)
Anion gap: 9 (ref 5–15)
BUN: 22 mg/dL (ref 8–23)
CO2: 27 mmol/L (ref 22–32)
Calcium: 8.9 mg/dL (ref 8.9–10.3)
Chloride: 99 mmol/L (ref 98–111)
Creatinine, Ser: 0.52 mg/dL (ref 0.44–1.00)
GFR calc Af Amer: >60 mL/min (ref >60)
GFR calc non Af Amer: >60 mL/min (ref >60)
Glucose, Bld: 262 mg/dL — ABNORMAL HIGH (ref 70–99)
Potassium: 3.3 mmol/L — ABNORMAL LOW (ref 3.5–5.1)
Sodium: 135 mmol/L (ref 135–145)
Total Bilirubin: 0.9 mg/dL (ref 0.3–1.2)
Total Protein: 5.5 g/dL — ABNORMAL LOW (ref 6.5–8.1)

## 2018-05-06 LAB — CBC
HCT: 31.5 % — ABNORMAL LOW (ref 36.0–46.0)
Hemoglobin: 10.9 g/dL — ABNORMAL LOW (ref 12.0–15.0)
MCH: 28 pg (ref 26.0–34.0)
MCHC: 34.6 g/dL (ref 30.0–36.0)
MCV: 81 fL (ref 80.0–100.0)
Platelets: 152 10*3/uL (ref 150–400)
RBC: 3.89 MIL/uL (ref 3.87–5.11)
RDW: 14.3 % (ref 11.5–15.5)
WBC: 6.1 10*3/uL (ref 4.0–10.5)
nRBC: 0 % (ref 0.0–0.2)

## 2018-05-06 LAB — GLUCOSE, CAPILLARY: Glucose-Capillary: 259 mg/dL — ABNORMAL HIGH (ref 70–99)

## 2018-05-06 LAB — MAGNESIUM: Magnesium: 1.9 mg/dL (ref 1.7–2.4)

## 2018-05-06 LAB — T3, FREE: T3, Free: 13.2 pg/mL — ABNORMAL HIGH (ref 2.0–4.4)

## 2018-05-06 MED ORDER — METRONIDAZOLE 500 MG PO TABS: 500.0000 mg | ORAL_TABLET | Freq: Three times a day (TID) | ORAL | 0 refills | Status: AC

## 2018-05-06 MED ORDER — METOPROLOL TARTRATE 25 MG PO TABS: 12.5000 mg | ORAL_TABLET | Freq: Three times a day (TID) | ORAL | 0 refills | Status: AC

## 2018-05-06 MED ORDER — CEFDINIR 300 MG PO CAPS: 300.0000 mg | ORAL_CAPSULE | Freq: Two times a day (BID) | ORAL | 0 refills | Status: AC

## 2018-05-06 MED ORDER — INSULIN ASPART 100 UNIT/ML ~~LOC~~ SOLN: 0.0000 [IU] | Freq: Three times a day (TID) | SUBCUTANEOUS | Status: DC

## 2018-05-06 MED ORDER — FUROSEMIDE 20 MG PO TABS: 20.0000 mg | ORAL_TABLET | Freq: Two times a day (BID) | ORAL | 0 refills | Status: AC

## 2018-05-06 MED ORDER — METHIMAZOLE 10 MG PO TABS: 10.0000 mg | ORAL_TABLET | Freq: Two times a day (BID) | ORAL | 0 refills | Status: AC

## 2018-05-06 MED ORDER — INSULIN ASPART 100 UNIT/ML ~~LOC~~ SOLN
0.0000 [IU] | Freq: Three times a day (TID) | SUBCUTANEOUS | Status: DC
  Administered 2018-05-06: 5 [IU] via SUBCUTANEOUS

## 2018-05-06 MED ORDER — POTASSIUM CHLORIDE CRYS ER 20 MEQ PO TBCR
40.0000 meq | EXTENDED_RELEASE_TABLET | Freq: Once | ORAL | Status: AC
  Administered 2018-05-06: 40 meq via ORAL
  Filled 2018-05-06: qty 2

## 2018-05-06 MED FILL — METOPROLOL TARTRATE 25 MG T: 25 | 60 days supply | Qty: 90 | Fill #0

## 2018-05-06 MED FILL — CEFDINIR 300 MG CAPSULE: 300 | 2 days supply | Qty: 4 | Fill #0

## 2018-05-06 MED FILL — metroNIDAZOLE 500 MG TABS: 500 | 2 days supply | Qty: 6 | Fill #0

## 2018-05-06 MED FILL — methIMAzole 10 MG TABS: 10 | 30 days supply | Qty: 60 | Fill #0

## 2018-05-06 NOTE — [place] FL2 (Signed)
[place] MEDICAID FL2 LEVEL OF CARE SCREENING TOOL     IDENTIFICATION  Patient Name: Rachel Williams Birthdate: 12/06/1950 Sex: female Admission Date (Current Location): 05/04/2018  [facility] [place]   Facility and Address:  [facility], [address], [place], [place] [address]      Provider Number: [id number]  Attending Physician Name and Address:  Noralee Stainhoi, Jennifer, DO  Relative Name and Phone Number:       Current Level of Care: Hospital Recommended Level of Care: Assisted Living Facility Prior Approval Number:    Date Approved/Denied:   PASRR Number:    Discharge Plan: Other (Comment)(ALF)    Current Diagnoses: Patient Active Problem List   Diagnosis Date Noted  . Acute respiratory failure with hypoxia (HCC) 05/04/2018  . Abdominal pain   . Ascites   . MRSA infection   . Bacterial peritonitis (HCC)   . Protein-calorie malnutrition, severe 08/26/2017  . Hepatic cirrhosis (HCC) 08/19/2017  . Intra-abdominal abscess s/p perc drainage 08/15/2017 08/19/2017  . Acute respiratory failure (HCC)   . Pleural effusion   . Severe sepsis (HCC) 08/15/2017  . Chronic diastolic CHF (congestive heart failure) (HCC) 08/15/2017  . Pulmonary hypertension (HCC) 06/26/2017  . CHF (congestive heart failure) (HCC) 06/25/2017  . Acute CHF (congestive heart failure) (HCC) 06/24/2017  . Right middle lobe pulmonary infiltrate 06/24/2017  . Sepsis, unspecified organism (HCC) 05/21/2016  . Elevated LFTs 05/21/2016  . Macrocytic anemia 05/21/2016  . Influenza A 05/21/2016  . Malnutrition of moderate degree 05/29/2015  . NSVT (nonsustained ventricular tachycardia) (HCC) 05/29/2015  . Hyponatremia 05/27/2015  . Recurrent falls 05/27/2015  . Facial bruising 05/27/2015  . Hypomagnesemia 05/27/2015  . Hypokalemia 05/27/2015  . Alcohol use (HCC), daily 2-3 drinks of vodka 05/27/2015  . Smoker 05/27/2015  . Dehydration 05/27/2015  . Vertebral  compression fracture (HCC) 05/27/2015  . Acute pharyngitis 05/27/2015  . Chronic constipation 05/27/2015  . Generalized weakness     Orientation RESPIRATION BLADDER Height & Weight     Self, Time, Situation, Place  Normal Incontinent Weight: 93 lb 7.6 oz (42.4 kg) Height:  5\' 2"  (157.5 cm)  BEHAVIORAL SYMPTOMS/MOOD NEUROLOGICAL BOWEL NUTRITION STATUS  (None) (None) Continent Diet (Regular)  AMBULATORY STATUS COMMUNICATION OF NEEDS Skin     Verbally Bruising(MASD.)                       Personal Care Assistance Level of Assistance              Functional Limitations Info  Sight, Hearing, Speech Sight Info: Adequate Hearing Info: Adequate Speech Info: Adequate    SPECIAL CARE FACTORS FREQUENCY                       Contractures Contractures Info: Not present    Additional Factors Info  Code Status, Allergies Code Status Info: Full code Allergies Info: Ciprofloxacin, Levofloxacin, Nickel, Penicillins.           Current Medications (05/06/2018):  This is the current hospital active medication list Current Facility-Administered Medications  Medication Dose Route Frequency Provider Last Rate Last Dose  . budesonide (PULMICORT) nebulizer solution 0.25 mg  0.25 mg Nebulization BID Berton Mountgbata, Sylvester I, MD   0.25 mg at 05/06/18 [id number]  . cefdinir (OMNICEF) capsule 300 mg  300 mg Oral Q12H Narda BondsNettey, Ralph A, MD   300 mg at 05/06/18 0846  . enoxaparin (LOVENOX) injection 30 mg  30 mg Subcutaneous Q24H Narda Bonds, MD   30 mg at 05/05/18 2110  . folic acid (FOLVITE) tablet 1 mg  1 mg Oral Daily Berton Mount I, MD   1 mg at 05/06/18 0847  . furosemide (LASIX) injection 20 mg  20 mg Intravenous BID Berton Mount I, MD   20 mg at 05/06/18 4825  . insulin aspart (novoLOG) injection 0-9 Units  0-9 Units Subcutaneous TID WC Noralee Stain, DO   5 Units at 05/06/18 0844  . ipratropium (ATROVENT) nebulizer solution 0.5 mg  0.5 mg Nebulization BID Narda Bonds,  MD   0.5 mg at 05/06/18 0819  . levalbuterol (XOPENEX) nebulizer solution 0.63 mg  0.63 mg Nebulization BID Narda Bonds, MD   0.63 mg at 05/06/18 0819  . LORazepam (ATIVAN) tablet 1 mg  1 mg Oral Q6H PRN Barnetta Chapel, MD       Or  . LORazepam (ATIVAN) injection 1 mg  1 mg Intravenous Q6H PRN Berton Mount I, MD      . LORazepam (ATIVAN) tablet 0-4 mg  0-4 mg Oral Q6H Barnetta Chapel, MD       Followed by  . LORazepam (ATIVAN) tablet 0-4 mg  0-4 mg Oral Q12H Berton Mount I, MD      . methimazole (TAPAZOLE) tablet 10 mg  10 mg Oral BID Berton Mount I, MD   10 mg at 05/06/18 0846  . methylPREDNISolone sodium succinate (SOLU-MEDROL) 40 mg/mL injection 40 mg  40 mg Intravenous Q6H Berton Mount I, MD   40 mg at 05/06/18 1134  . metoprolol tartrate (LOPRESSOR) tablet 12.5 mg  12.5 mg Oral Q8H Opyd, Lavone Neri, MD   12.5 mg at 05/06/18 0629  . metroNIDAZOLE (FLAGYL) IVPB 500 mg  500 mg Intravenous Q8H Narda Bonds, MD 100 mL/hr at 05/06/18 0252 500 mg at 05/06/18 0252  . multivitamin with minerals tablet 1 tablet  1 tablet Oral Daily Berton Mount I, MD   1 tablet at 05/06/18 0847  . spironolactone (ALDACTONE) tablet 12.5 mg  12.5 mg Oral Daily Berton Mount I, MD   12.5 mg at 05/06/18 0846  . thiamine (VITAMIN B-1) tablet 100 mg  100 mg Oral Daily Berton Mount I, MD   100 mg at 05/06/18 0037   Or  . thiamine (B-1) injection 100 mg  100 mg Intravenous Daily Barnetta Chapel, MD         Discharge Medications: TAKE these medications   cefdinir 300 MG capsule Commonly known as:  OMNICEF Take 1 capsule (300 mg total) by mouth every 12 (twelve) hours for 2 days.   folic acid 1 MG tablet Commonly known as:  FOLVITE Take 1 tablet (1 mg total) by mouth daily.   furosemide 20 MG tablet Commonly known as:  LASIX Take 1 tablet (20 mg total) by mouth 2 (two) times daily. What changed:    medication strength  how much to take  when to take  this   methimazole 10 MG tablet Commonly known as:  TAPAZOLE Take 1 tablet (10 mg total) by mouth 2 (two) times daily.   metoprolol tartrate 25 MG tablet Commonly known as:  LOPRESSOR Take 0.5 tablets (12.5 mg total) by mouth every 8 (eight) hours.   metroNIDAZOLE 500 MG tablet Commonly known as:  FLAGYL Take 1 tablet (500 mg total) by mouth 3 (three) times daily for 2 days.   nicotine 14 mg/24hr patch Commonly known as:  NICODERM CQ -  dosed in mg/24 hours Place 1 patch (14 mg total) onto the skin daily.   spironolactone 25 MG tablet Commonly known as:  ALDACTONE Take 0.5 tablets (12.5 mg total) by mouth daily.   thiamine 100 MG tablet Take 1 tablet (100 mg total) by mouth daily.     Relevant Imaging Results:  Relevant Lab Results:   Additional Information SS#: [id number]  Margarito Liner, LCSW

## 2018-05-06 NOTE — Clinical Social Work Note (Signed)
CSW facilitated patient discharge including contacting patient family and facility to confirm patient discharge plans. Clinical information faxed to facility and family agreeable with plan. Patient's daughter will transport by car back to [facility]. RN to call report prior to discharge [phone]).  CSW will sign off for now as social work intervention is no longer needed. Please consult [place] again if new needs arise.  Charlynn Court, CSW [phone]

## 2018-05-06 NOTE — Clinical Social Work Note (Signed)
[facility] to notify [facility] that patient will return today. Waiting on call back.  Rachel CourtSarah Khyleigh Furney, CSW [phone]

## 2018-05-06 NOTE — Discharge Summary (Signed)
Physician Discharge Summary  Kalman JewelsSusan M Daddona ZOX:[id number] DOB: May 19, 1950 DOA: 05/04/2018  PCP: Sigmund HazelMiller, Lisa, MD  Admit date: 05/04/2018 Discharge date: 05/06/2018  Admitted From: ALF Disposition:  ALF  Recommendations for Outpatient Follow-up:  1. Follow up with PCP in 1 week 2. Follow up with outpatient endocrinology  3. Follow up with Dr. Gala RomneyBensimhon   4. Please obtain BMP while on diuretics 5. Please follow up on the following pending results: Final blood culture results, negative at day of discharge 6. Follow-up for repeat imaging vs referral for pulmonary evaluation and bronchoscopy for findings on CT chest (see below)   Discharge Condition: Stable CODE STATUS: Full  Diet recommendation: Heart healthy   Brief/Interim Summary: From H&P by Dr. Dartha Lodgegbata: Patient is a 68 year old female, quite cachectic, with past medical history significant for alcohol abuse, tobacco abuse, reported COPD, pulmonary hypertension, diastolic congestive heart failure and pneumonia.  Patient was recently found to have significantly low/non-detectable TSH, with associated severe weight loss of about 55 pounds over 6 weeks.  Following recent fall, patient's left lower extremity was splinted.  Patient is awaiting endocrinology follow-up.  Apparently, patient was scheduled to have eye surgery sometime last week but was found to have significant tachycardia.  Patient diuretics were discontinued, and patient was started on Coreg.  Following discontinuation of diuretics, patient developed progressive edema.  Patient developed respiratory distress earlier today, and was said to have O2 sat of 60% when patient was seen by the EMS.  On presentation to the ER, patient remained in significant respiratory distress, was placed on BiPAP, tachycardic undetectable TSH was noted.  CTA of the chest done was negative for pulmonary embolism, but revealed possible aspiration/aspiration pneumonitis versus possible obstruction membra course of  the left lower lobe.  Trace right-sided pleural effusion was also reported.  No associated fever or chills.  Patient tells me that last alcohol use was yesterday.  No headache, no neck pain, no chest pain, no GI symptoms and no urinary symptoms.  Albumin is 2.7, with cardiac BNP of 2819 with lactic acid of 2.8.  ABG revealed 7.39, PCO2 of 44.9 and PCO2 of 459.  Serum creatinine is 0.79, total bilirubin of 1.9.  Point-of-care troponin was 0.05.  WBC 7.4 with hemoglobin of 11.9 hematocrit of 37.2.  Patient be admitted for further assessment and management.  ED Course: Patient was in significant respiratory distress on presentation to the ER, was tachycardic.  Patient was placed on BiPAP.  Initial work-up was done.  CT of the chest is negative for pulmonary embolism.  Hospitalist service has been asked to admit patient for further assessment and management  Interim: Patient was started on methimazole, antibiotic for concern of aspiration pneumonia, IV Solu-Medrol, IV Lasix.  Her oxygenation requirements was weaned down, patient was on room air on day of discharge.  She had intermittent tachycardia, but remained symptom-free.  Her daughter and patient were eager for discharge back to her assisted living facility.  They ensured me that patient has close outpatient follow-up with PCP as well as endocrinology scheduled.  On day of discharge, patient was feeling well, without any acute complaints of any chest pain, palpitation or shortness of breath.  Questions and concerns answered prior to discharge home.  Discharge Diagnoses:   Acute respiratory failure with hypoxia Required Bipap on admission, weaned down.  On day of discharge, patient was on room air  Acute on chronic diastolic heart failure Patient was taken off of lasix as an outpatient which likely precipitated exacerbation.  Patient treated with IV Lasix.  Discharged home with Lasix, spironolactone  Aspiration pneumonia Debris found on CT chest.  Concern for aspiration vs mucous plug. Started on antibiotics.  Continue cefdinir/flagyl (penicillin allergy)  Elevated troponin No chest pain or abnormal EKG findings concerning for ACS. Likely demand in setting of heart failure.   Hypokalemia Replete, follow as outpatient  Hyperthyroidism Undetectable TSH with elevated free T4. Started on methimazole on admission. Patient with outpatient endocrinology follow-up.  Continue metoprolol  Pulmonary hypertension Severe  Alcohol abuse Continue thiamine, folate  Tobacco abuse Counseled on admission. Continue nicotine patch  Weight loss Dietitian consulted. Likely secondary to hyperthyroidism.  History of left leg surgery Currently in immobilizer. Currently does not ambulate and travels via wheelchair   Discharge Instructions  Discharge Instructions    (HEART FAILURE PATIENTS) Call MD:  Anytime you have any of the following symptoms: 1) 3 pound weight gain in 24 hours or 5 pounds in 1 week 2) shortness of breath, with or without a dry hacking cough 3) swelling in the hands, feet or stomach 4) if you have to sleep on extra pillows at night in order to breathe.   Complete by:  As directed    Call MD for:  difficulty breathing, headache or visual disturbances   Complete by:  As directed    Call MD for:  extreme fatigue   Complete by:  As directed    Call MD for:  hives   Complete by:  As directed    Call MD for:  persistant dizziness or light-headedness   Complete by:  As directed    Call MD for:  persistant nausea and vomiting   Complete by:  As directed    Call MD for:  severe uncontrolled pain   Complete by:  As directed    Call MD for:  temperature >100.4   Complete by:  As directed    Diet - low sodium heart healthy   Complete by:  As directed    Discharge instructions   Complete by:  As directed    You were cared for by a hospitalist during your hospital stay. If you have any questions about your discharge  medications or the care you received while you were in the hospital after you are discharged, you can call the unit and ask to speak with the hospitalist on call if the hospitalist that took care of you is not available. Once you are discharged, your primary care physician will handle any further medical issues. Please note that NO REFILLS for any discharge medications will be authorized once you are discharged, as it is imperative that you return to your primary care physician (or establish a relationship with a primary care physician if you do not have one) for your aftercare needs so that they can reassess your need for medications and monitor your lab values.   Increase activity slowly   Complete by:  As directed      Allergies as of 05/06/2018      Reactions   Ciprofloxacin Rash   Levofloxacin Rash   Rash on back after 5 days therapy.    Nickel Rash   Anything with nickel in it.    Penicillins Rash   Tolerates cephalosporins  Has patient had a PCN reaction causing immediate rash, facial/tongue/throat swelling, SOB or lightheadedness with hypotension: No Has patient had a PCN reaction causing severe rash involving mucus membranes or skin necrosis: No Ha patient had a PCN reaction that required  hospitalization No Has patient had a PCN reaction occurring within the last 10 years: No If all of the above answers are "NO", then may proceed with: Cephalosporins       Medication List    TAKE these medications   cefdinir 300 MG capsule Commonly known as:  OMNICEF Take 1 capsule (300 mg total) by mouth every 12 (twelve) hours for 2 days.   folic acid 1 MG tablet Commonly known as:  FOLVITE Take 1 tablet (1 mg total) by mouth daily.   furosemide 20 MG tablet Commonly known as:  LASIX Take 1 tablet (20 mg total) by mouth 2 (two) times daily. What changed:    medication strength  how much to take  when to take this   methimazole 10 MG tablet Commonly known as:  TAPAZOLE Take 1  tablet (10 mg total) by mouth 2 (two) times daily.   metoprolol tartrate 25 MG tablet Commonly known as:  LOPRESSOR Take 0.5 tablets (12.5 mg total) by mouth every 8 (eight) hours.   metroNIDAZOLE 500 MG tablet Commonly known as:  FLAGYL Take 1 tablet (500 mg total) by mouth 3 (three) times daily for 2 days.   nicotine 14 mg/24hr patch Commonly known as:  NICODERM CQ - dosed in mg/24 hours Place 1 patch (14 mg total) onto the skin daily.   spironolactone 25 MG tablet Commonly known as:  ALDACTONE Take 0.5 tablets (12.5 mg total) by mouth daily.   thiamine 100 MG tablet Take 1 tablet (100 mg total) by mouth daily.      Follow-up Information    Sigmund Hazel, MD. Schedule an appointment as soon as possible for a visit in 1 week(s).   Specialty:  Family Medicine Contact information: [address] [place] [address] [phone]        Bensimhon, Bevelyn Buckles, MD. Schedule an appointment as soon as possible for a visit in 1 week(s).   Specialty:  Cardiology Contact information: [address] [place] [address] [phone]        Endocrinology. Schedule an appointment as soon as possible for a visit in 1 week(s).   Why:  Reschedule your endocrinology appointment          Allergies  Allergen Reactions  . Ciprofloxacin Rash  . Levofloxacin Rash    Rash on back after 5 days therapy.   . Nickel Rash    Anything with nickel in it.   Marland Kitchen Penicillins Rash    Tolerates cephalosporins   Has patient had a PCN reaction causing immediate rash, facial/tongue/throat swelling, SOB or lightheadedness with hypotension: No Has patient had a PCN reaction causing severe rash involving mucus membranes or skin necrosis: No Ha patient had a PCN reaction that required hospitalization No Has patient had a PCN reaction occurring within the last 10 years: No If all of the above answers are "NO", then may proceed with: Cephalosporins        Consultations:  None   Procedures/Studies: Ct Angio Chest Pe W And/or Wo Contrast  Result Date: 05/04/2018 CLINICAL DATA:  68 year old female with a history of shortness of breath. History of prior thromboembolic disease EXAM: CT ANGIOGRAPHY CHEST WITH CONTRAST TECHNIQUE: Multidetector CT imaging of the chest was performed using the standard protocol during bolus administration of intravenous contrast. Multiplanar CT image reconstructions and MIPs were obtained to evaluate the vascular anatomy. CONTRAST:  55mL ISOVUE-370 IOPAMIDOL (ISOVUE-370) INJECTION 76% COMPARISON:  06/25/2017 FINDINGS: Cardiovascular: Heart: Heart size unchanged with  cardiomegaly. No pericardial fluid/thickening. Calcifications of the left anterior descending, circumflex coronary arteries. Aorta: Minimal atherosclerotic changes of the aortic arch. Greatest diameter of the ascending aorta measures 3.3 cm. No dissection. No periaortic fluid. Pulmonary arteries: No central, lobar, segmental, or proximal subsegmental filling defects. Main pulmonary artery measures 3.8 cm. Mediastinum/Nodes: Mediastinal lymph nodes are present, without significant enlargement. The index nodes in the lowest paratracheal station measures 8 mm-9 mm. Unremarkable appearance of the thoracic esophagus. Unremarkable thoracic inlet Lungs/Pleura: The left main stem demonstrates minimal debris, nonocclusive. The left upper lobe bronchi are patent with no significant thickening. The main bronchus to the left lower lobe is occluded at the origin, with minimal airway to the superior segments of the left lower lobe. Partial volume loss of the left lower lobe with confluent airspace opacity in the dependent aspects of the left lower lobe, which is only partially aerated. No left-sided pleural effusion. No significant interlobular septal thickening. Trace right-sided pleural effusion with associated atelectasis. Centrilobular emphysema. Upper Abdomen: No acute.  Musculoskeletal: Osteopenia. Similar configuration of T6 and T7 compression fracture without significant further height loss. Similar appearance of compression fracture at the T10 level without further height loss. There Is redemonstration of T11 compression fracture, now with further height loss of approximately 50% anterior. No fracture line identified with overall sclerotic changes. Adjacent level vacuum disc phenomenon. New compression fracture at T12 compared to the prior with less than 30% anterior height loss. None of the fractures demonstrate posterior endplate retropulsion. Multilevel degenerative changes of the spine without bony canal narrowing. No acute displaced fracture is identified. Review of the MIP images confirms the above findings. IMPRESSION: CT is negative for acute pulmonary emboli. Obstructive debris within the main bronchus to the left lower lobe, with partial volume loss and filling of some of the airways of the left lower lobe. Given the additional debris within the left mainstem bronchus, this is favored to be related to aspiration/pneumonitis, however, obstructive lesion with postobstructive pneumonitis can not be excluded. Follow-up strategy may include repeat imaging after treatment versus referral for pulmonary evaluation and bronchoscopy. Borderline mediastinal lymph nodes, favored to be reactive given the above findings of the left lower lobe. Trace right-sided pleural effusion. Evidence of pulmonary hypertension with enlarged main pulmonary artery. Emphysema (ICD10-J43.9). Mild aortic atherosclerosis and associated coronary artery disease. Aortic Atherosclerosis (ICD10-I70.0). Osteopenia with multiple chronic compression fracture as above. Electronically Signed   By: Gilmer Mor D.O.   On: 05/04/2018 16:45   Dg Chest Port 1 View  Result Date: 05/04/2018 CLINICAL DATA:  Shortness of Breath EXAM: PORTABLE CHEST 1 VIEW COMPARISON:  08/21/2017 FINDINGS: Cardiac shadow is enlarged  but stable. Aortic calcifications are again seen. The lungs are well aerated bilaterally. No focal infiltrate or sizable effusion is seen. Chronic blunting of left costophrenic angle is noted. IMPRESSION: No acute abnormality noted. Electronically Signed   By: Alcide Clever M.D.   On: 05/04/2018 12:48    Echo 05/05/2018  IMPRESSIONS    1. The left ventricle appears to be normal in size, has normal wall thickness 60-65% ejection fraction Spectral Doppler shows pseudonormal pattern of diastolic filling.  2. Right ventricular systolic pressure is is severely elevated.  3. Measured gradient is 50 mmHg, recorded measure is erroneous. RVSP estimated at 65 mmHg.  4. The right ventricle has normal size and normal systolic function.  5. Moderately dilated left atrial size.  6. Severely dilated right atrial size.  7. Mitral valve regurgitation is mild by color flow  Doppler.  8. The mitral valve normal in structure and function.  9. Normal tricuspid valve. 10. Tricuspid regurgitation moderate. 11. Aortic valve normal. 12. There is mild calcification of the aortic valve. 13. There is mild thickening of the aortic valve. 14. No atrial level shunt detected by color flow Doppler.   Discharge Exam: Vitals:   05/06/18 0821 05/06/18 0855  BP:    Pulse:    Resp:    Temp:    SpO2: 95% 97%    General: Pt is alert, awake, not in acute distress, thin and cachectic  Cardiovascular: tachycardic, rate 100s, S1/S2 +, no rubs, no gallops Respiratory: Diminished breath sounds, no wheezes, no conversational dyspnea, on room air  Abdominal: Soft, NT, ND, bowel sounds + Extremities: no edema, no cyanosis    The results of significant diagnostics from this hospitalization (including imaging, microbiology, ancillary and laboratory) are listed below for reference.     Microbiology: Recent Results (from the past 240 hour(s))  Blood Culture (routine x 2)     Status: None (Preliminary result)   Collection  Time: 05/04/18 12:35 PM  Result Value Ref Range Status   Specimen Description BLOOD LEFT ANTECUBITAL  Final   Special Requests   Final    BOTTLES DRAWN AEROBIC AND ANAEROBIC Blood Culture adequate volume   Culture   Final    NO GROWTH 2 DAYS Performed at [facility], [address], [place], [place] [address]    Report Status PENDING  Incomplete  Blood Culture (routine x 2)     Status: None (Preliminary result)   Collection Time: 05/04/18 12:40 PM  Result Value Ref Range Status   Specimen Description BLOOD LEFT FOREARM  Final   Special Requests   Final    BOTTLES DRAWN AEROBIC AND ANAEROBIC Blood Culture adequate volume   Culture   Final    NO GROWTH 2 DAYS Performed at [facility], [address] [place], [place] [address]    Report Status PENDING  Incomplete  MRSA PCR Screening     Status: None   Collection Time: 05/04/18  9:42 PM  Result Value Ref Range Status   MRSA by PCR NEGATIVE NEGATIVE Final    Comment:        The GeneXpert MRSA Assay (FDA approved for NASAL specimens only), is one component of a comprehensive MRSA colonization surveillance program. It is not intended to diagnose MRSA infection nor to guide or monitor treatment for MRSA infections. Performed at [facility], [address], [place], [place] [address]      Labs: BNP (last 3 results) Recent Labs    08/15/17 1002 08/21/17 0415 05/04/18 1235  BNP 2,427.4* 3,278.5* 2,819.7*   Basic Metabolic Panel: Recent Labs  Lab 05/04/18 1235 05/04/18 1240 05/04/18 1252 05/04/18 2042 05/05/18 0417 05/05/18 1050 05/06/18 0540  NA 136 135  --   --  134*  --  135  K 4.5 4.5  --   --  3.1*  --  3.3*  CL 98  --   --   --  99  --  99  CO2 23  --   --   --  25  --  27  GLUCOSE 79  --   --   --  309*  --  262*  BUN 16  --   --   --  23  --  22  CREATININE 0.79  --  0.60 1.00 0.64  --  0.52  CALCIUM 9.3  --   --   --  8.7*  --  8.9  MG  --   --   --  1.1*  --  1.2* 1.9  PHOS  --    --   --  3.4  --   --   --    Liver Function Tests: Recent Labs  Lab 05/04/18 1235 05/04/18 2042 05/06/18 0540  AST 54* 53* 29  ALT 32 32 26  ALKPHOS 173* 138* 129*  BILITOT 1.9* 1.1 0.9  PROT 6.2* 5.4* 5.5*  ALBUMIN 2.7* 2.4* 2.4*   No results for input(s): LIPASE, AMYLASE in the last 168 hours. No results for input(s): AMMONIA in the last 168 hours. CBC: Recent Labs  Lab 05/04/18 1235 05/04/18 1240 05/04/18 2042 05/05/18 0417 05/06/18 0540  WBC 7.4  --  2.8* 2.5* 6.1  NEUTROABS 5.7  --   --   --   --   HGB 11.9* 10.5* 10.5* 10.5* 10.9*  HCT 37.2 31.0* 33.6* 32.2* 31.5*  MCV 87.9  --  85.1 82.8 81.0  PLT 171  --  151 141* 152   Cardiac Enzymes: Recent Labs  Lab 05/04/18 2042 05/04/18 2334 05/05/18 1050  TROPONINI 0.08* 0.10* 0.11*   BNP: Invalid input(s): POCBNP CBG: Recent Labs  Lab 05/06/18 0806  GLUCAP 259*   D-Dimer Recent Labs    05/04/18 1235  DDIMER 3.46*   Hgb A1c No results for input(s): HGBA1C in the last 72 hours. Lipid Profile No results for input(s): CHOL, HDL, LDLCALC, TRIG, CHOLHDL, LDLDIRECT in the last 72 hours. Thyroid function studies Recent Labs    05/04/18 1437 05/05/18 1050  TSH <0.010*  --   T3FREE  --  13.2*   Anemia work up No results for input(s): VITAMINB12, FOLATE, FERRITIN, TIBC, IRON, RETICCTPCT in the last 72 hours. Urinalysis    Component Value Date/Time   COLORURINE YELLOW 05/04/2018 2331   APPEARANCEUR CLEAR 05/04/2018 2331   LABSPEC 1.017 05/04/2018 2331   PHURINE 6.0 05/04/2018 2331   GLUCOSEU >=500 (A) 05/04/2018 2331   HGBUR NEGATIVE 05/04/2018 2331   BILIRUBINUR NEGATIVE 05/04/2018 2331   KETONESUR NEGATIVE 05/04/2018 2331   PROTEINUR NEGATIVE 05/04/2018 2331   UROBILINOGEN 1.0 01/11/2010 1611   NITRITE NEGATIVE 05/04/2018 2331   LEUKOCYTESUR TRACE (A) 05/04/2018 2331   Sepsis Labs Invalid input(s): PROCALCITONIN,  WBC,  LACTICIDVEN Microbiology Recent Results (from the past 240 hour(s))   Blood Culture (routine x 2)     Status: None (Preliminary result)   Collection Time: 05/04/18 12:35 PM  Result Value Ref Range Status   Specimen Description BLOOD LEFT ANTECUBITAL  Final   Special Requests   Final    BOTTLES DRAWN AEROBIC AND ANAEROBIC Blood Culture adequate volume   Culture   Final    NO GROWTH 2 DAYS Performed at [facility], [address] [place], [place] [address]    Report Status PENDING  Incomplete  Blood Culture (routine x 2)     Status: None (Preliminary result)   Collection Time: 05/04/18 12:40 PM  Result Value Ref Range Status   Specimen Description BLOOD LEFT FOREARM  Final   Special Requests   Final    BOTTLES DRAWN AEROBIC AND ANAEROBIC Blood Culture adequate volume   Culture   Final    NO GROWTH 2 DAYS Performed at [facility], [address] [place], [place] [address]    Report Status PENDING  Incomplete  MRSA PCR Screening  Status: None   Collection Time: 05/04/18  9:42 PM  Result Value Ref Range Status   MRSA by PCR NEGATIVE NEGATIVE Final    Comment:        The GeneXpert MRSA Assay (FDA approved for NASAL specimens only), is one component of a comprehensive MRSA colonization surveillance program. It is not intended to diagnose MRSA infection nor to guide or monitor treatment for MRSA infections. Performed at [facility], [address] [place], [place] [address]      Patient was seen and examined on the day of discharge and was found to be in stable condition. Time coordinating discharge: 45 minutes including assessment and coordination of care, as well as examination of the patient.   SIGNED:  Noralee Stain, DO [facility] [email or web address] 05/06/2018, 10:40 AM

## 2018-05-06 NOTE — Progress Notes (Signed)
Pt and daughter given dc packet with information, aware of meds to pick up at pharmacy, reviewed medication changes, daughter says her bp gets too low and they have not been betablocker due to low bp, advised she has toelrated well and last bp here, , also pt says she has not been using the nicotine patch as she stills smokes, educated on cessation and benefits but still declined attemptd to call report to [facility] and had to leave voicemail,pt was dc in wheelchair with daughter and all belongings

## 2018-05-06 NOTE — Progress Notes (Signed)
Telemetry called to report heartrate 100's with brief nonsustained periods of tach 140-150's, pt resting in bed no distress, pt denies feeling sob, palps or chest pain, vss, 02 2l/[place] sat 98%, noted k 3.3 on labs, paged Dr Rayetta Humphrey

## 2018-05-09 LAB — CULTURE, BLOOD (ROUTINE X 2)
Culture: NO GROWTH
Culture: NO GROWTH
Special Requests: ADEQUATE
Special Requests: ADEQUATE

## 2018-05-13 MED FILL — DOXYCYCLINE HYCLATE 100 MG: 100 | 7 days supply | Qty: 14 | Fill #0

## 2018-06-01 MED FILL — CARVEDILOL 3.125 MG TABLET: 3.125 | 90 days supply | Qty: 180 | Fill #0

## 2018-06-04 MED FILL — methIMAzole 10 MG TABS: 10 | 30 days supply | Qty: 60 | Fill #0

## 2018-06-15 MED FILL — FOLIC ACID 1 MG TABS: 1 | 90 days supply | Qty: 90 | Fill #0

## 2018-06-16 MED FILL — DICLOFENAC SODIUM 1 % GEL: 1 | 90 days supply | Qty: 800 | Fill #0

## 2018-06-17 MED FILL — GABAPENTIN 300 MG CAPSULE: 300 | 90 days supply | Qty: 90 | Fill #0

## 2018-06-19 ENCOUNTER — Other Ambulatory Visit: Payer: Self-pay | Admitting: Family Medicine

## 2018-06-19 DIAGNOSIS — R9389 Abnormal findings on diagnostic imaging of other specified body structures: Secondary | ICD-10-CM

## 2018-07-01 MED FILL — methIMAzole 10 MG TABS: 10 | 30 days supply | Qty: 60 | Fill #1

## 2018-07-03 MED FILL — SPIRONOLACTONE 25 MG TABS: 25 | 90 days supply | Qty: 45 | Fill #0

## 2018-07-27 MED FILL — FUROSEMIDE 20 MG TABS: 20 | 90 days supply | Qty: 90 | Fill #0

## 2018-08-22 MED FILL — methIMAzole 10 MG TABS: 10 | 30 days supply | Qty: 60 | Fill #2

## 2018-09-14 MED FILL — FOLIC ACID 1 MG TABS: 1 | 90 days supply | Qty: 90 | Fill #0

## 2018-09-30 MED FILL — SPIRONOLACTONE 25 MG TABS: 25 | 90 days supply | Qty: 45 | Fill #0

## 2018-10-12 MED FILL — CARVEDILOL 3.125 MG TABLET: 3.125 | 90 days supply | Qty: 180 | Fill #0

## 2018-10-13 MED FILL — methIMAzole 10 MG TABS: 10 | 30 days supply | Qty: 60 | Fill #3

## 2018-10-23 MED FILL — FUROSEMIDE 20 MG TABS: 20 | 90 days supply | Qty: 90 | Fill #0

## 2018-12-11 MED FILL — FOLIC ACID 1 MG TABS: 1 | 90 days supply | Qty: 90 | Fill #0

## 2018-12-24 MED FILL — SPIRONOLACTONE 25 MG TABS: 25 | 90 days supply | Qty: 45 | Fill #0

## 2018-12-24 MED FILL — methIMAzole 10 MG TABS: 10 | 30 days supply | Qty: 60 | Fill #0

## 2019-01-22 MED FILL — FUROSEMIDE 20 MG TABS: 20 | 90 days supply | Qty: 90 | Fill #0

## 2019-02-19 MED FILL — methIMAzole 10 MG TABS: 10 | 30 days supply | Qty: 60 | Fill #1

## 2019-03-12 MED FILL — FOLIC ACID 1 MG TABS: 1 | 90 days supply | Qty: 90 | Fill #0

## 2019-03-20 IMAGING — CT CT ANGIO CHEST
2 of 7 series · 17 of 46 positions shown · IV contrast (APPLIED)
Comparison: 06/25/2017

CLINICAL DATA: 67-year-old female with a history of shortness of
breath. History of prior thromboembolic disease

EXAM:
CT ANGIOGRAPHY CHEST WITH CONTRAST
TECHNIQUE: Multidetector CT imaging of the chest was performed using the
standard protocol during bolus administration of intravenous
contrast. Multiplanar CT image reconstructions and MIPs were
obtained to evaluate the vascular anatomy.
CONTRAST:  55mL L0A5XO-4IT IOPAMIDOL (L0A5XO-4IT) INJECTION 76%

[Series 8: thins · axial · 0.56mm/px · z∈[+1056,+1303]mm · 14 of 397 slices shown]
[im 23/397  lung]
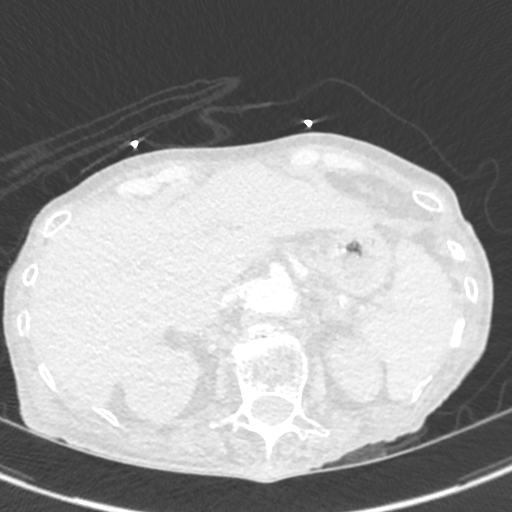
[im 45/397  soft-tissue]
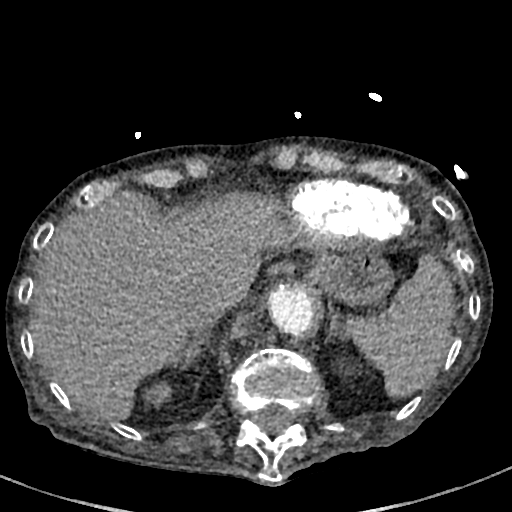
[im 89/397  lung]
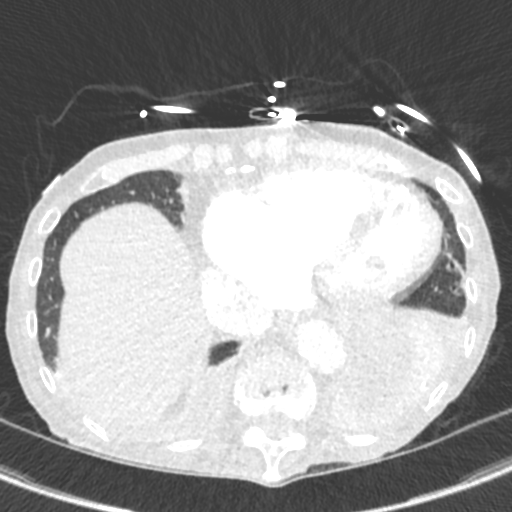
[im 111/397  soft-tissue]
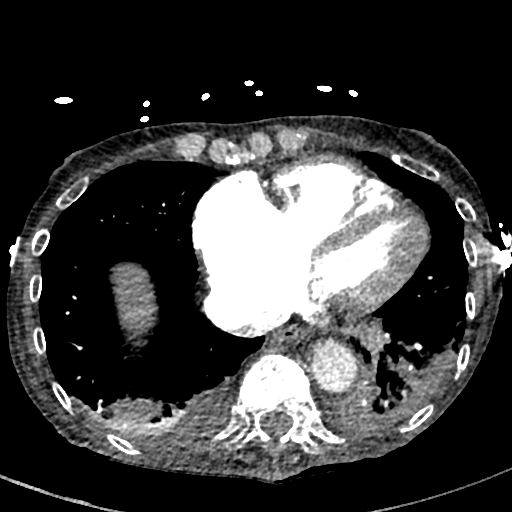
[im 133/397  lung]
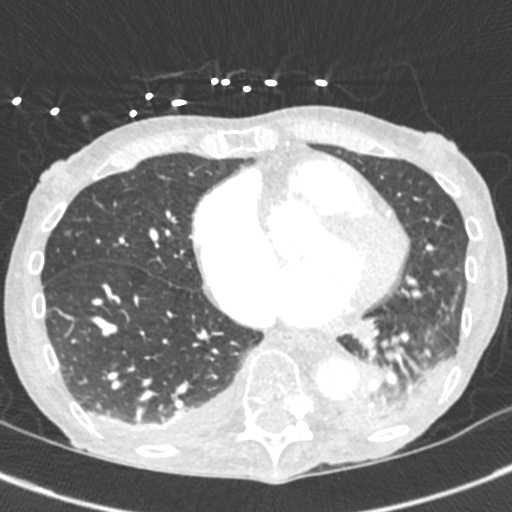
[im 155/397  soft-tissue]
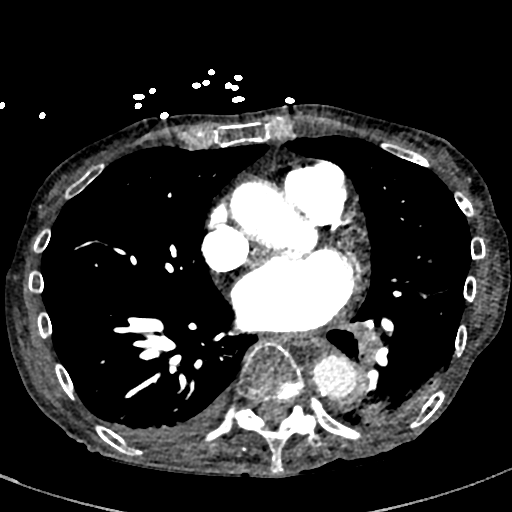
[im 177/397  lung]
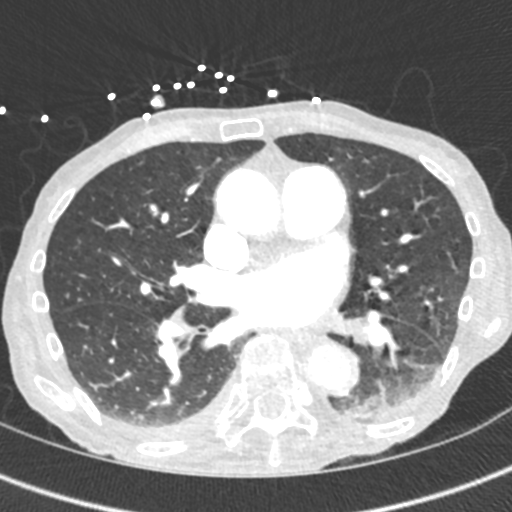
[im 221/397  soft-tissue]
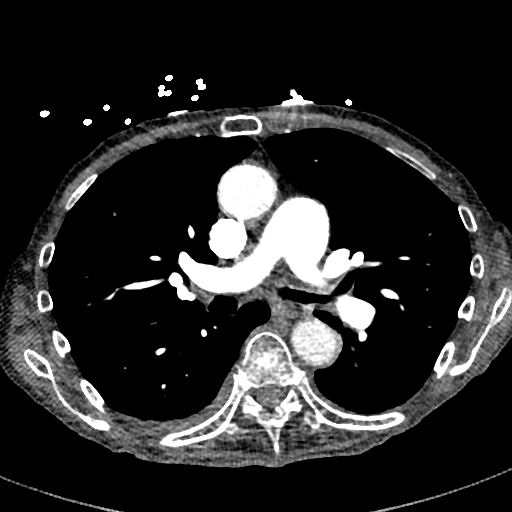
[im 243/397  lung]
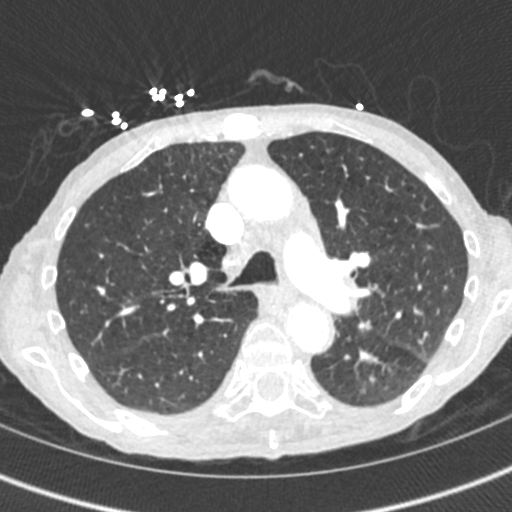
[im 265/397  soft-tissue]
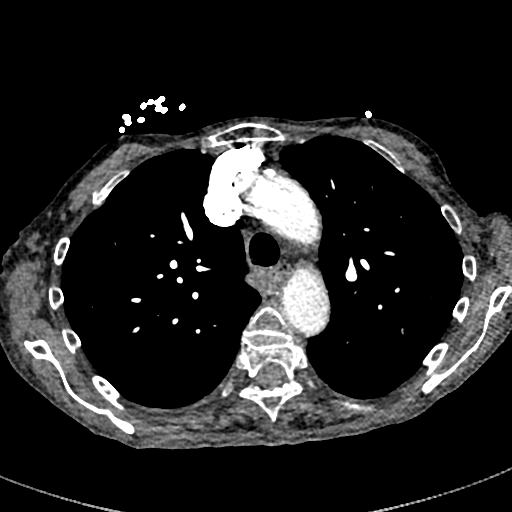
[im 287/397  lung]
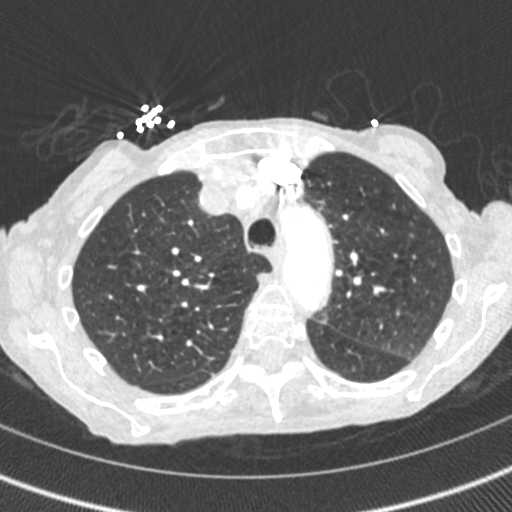
[im 309/397  soft-tissue]
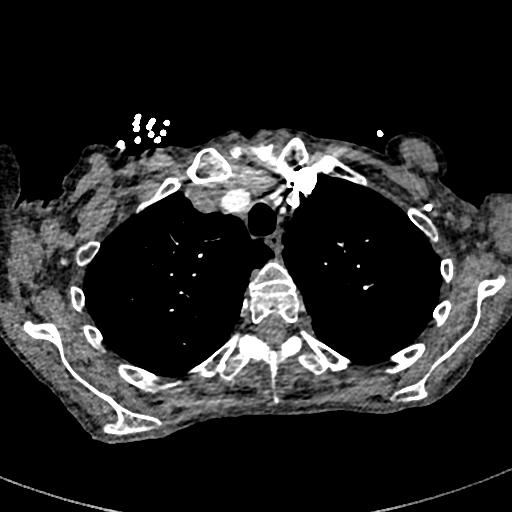
[im 353/397  lung]
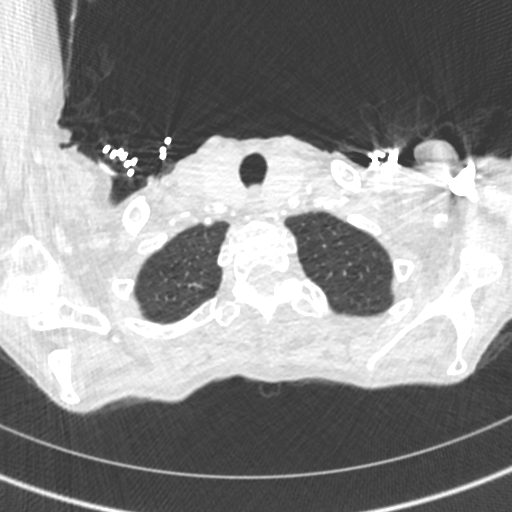
[im 375/397  soft-tissue]
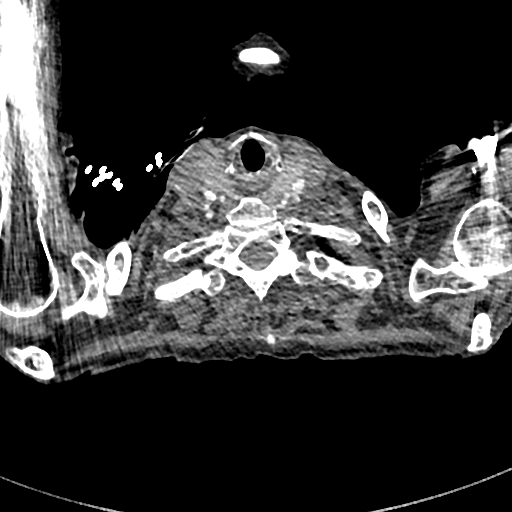

[Series 9: cor · coronal · 0.55mm/px · 3 of 150 slices shown]
[im 38/150  soft-tissue]
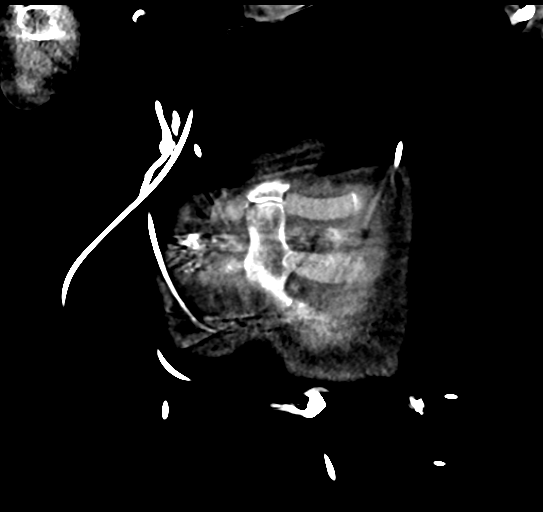
[im 75/150  soft-tissue]
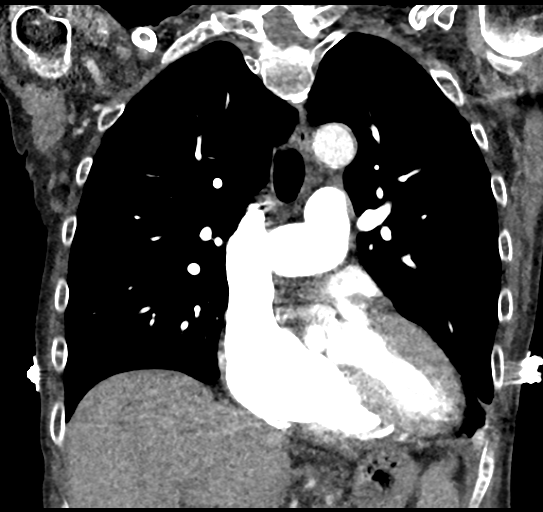
[im 112/150  soft-tissue]
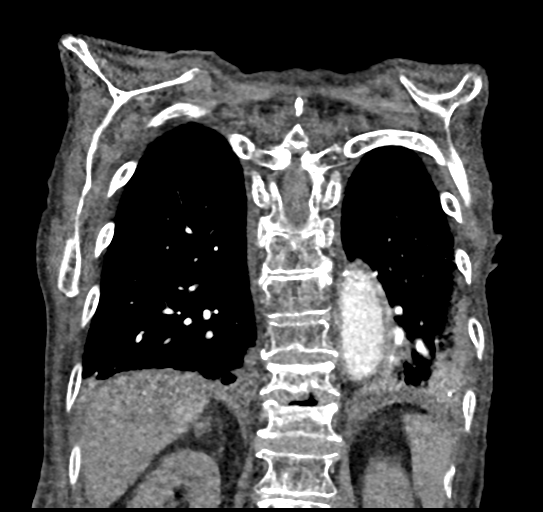

[17 of 46 positions shown; findings below may reference images not displayed]

FINDINGS: Cardiovascular:

Heart:

Heart size unchanged with cardiomegaly. No pericardial
fluid/thickening. Calcifications of the left anterior descending,
circumflex coronary arteries.

Aorta:

Minimal atherosclerotic changes of the aortic arch. Greatest
diameter of the ascending aorta measures 3.3 cm. No dissection. No
periaortic fluid.

Pulmonary arteries:

No central, lobar, segmental, or proximal subsegmental filling
defects. Main pulmonary artery measures 3.8 cm.

Mediastinum/Nodes: Mediastinal lymph nodes are present, without
significant enlargement. The index nodes in the lowest paratracheal
station measures 8 mm-9 mm. Unremarkable appearance of the thoracic
esophagus.

Unremarkable thoracic inlet

Lungs/Pleura: The left main stem demonstrates minimal debris,
nonocclusive. The left upper lobe bronchi are patent with no
significant thickening. The main bronchus to the left lower lobe is
occluded at the origin, with minimal airway to the superior segments
of the left lower lobe. Partial volume loss of the left lower lobe
with confluent airspace opacity in the dependent aspects of the left
lower lobe, which is only partially aerated. No left-sided pleural
effusion. No significant interlobular septal thickening.

Trace right-sided pleural effusion with associated atelectasis.

Centrilobular emphysema.

Upper Abdomen: No acute.

Musculoskeletal: Osteopenia. Similar configuration of T6 and T7
compression fracture without significant further height loss.

Similar appearance of compression fracture at the T10 level without
further height loss.

There Is redemonstration of T11 compression fracture, now with
further height loss of approximately 50% anterior. No fracture line
identified with overall sclerotic changes. Adjacent level vacuum
disc phenomenon. New compression fracture at T12 compared to the
prior with less than 30% anterior height loss. None of the fractures
demonstrate posterior endplate retropulsion. Multilevel degenerative
changes of the spine without bony canal narrowing. No acute
displaced fracture is identified.

Review of the MIP images confirms the above findings.
IMPRESSION: CT is negative for acute pulmonary emboli.

Obstructive debris within the main bronchus to the left lower lobe,
with partial volume loss and filling of some of the airways of the
left lower lobe. Given the additional debris within the left
mainstem bronchus, this is favored to be related to
aspiration/pneumonitis, however, obstructive lesion with
postobstructive pneumonitis can not be excluded. Follow-up strategy
may include repeat imaging after treatment versus referral for
pulmonary evaluation and bronchoscopy.

Borderline mediastinal lymph nodes, favored to be reactive given the
above findings of the left lower lobe.

Trace right-sided pleural effusion.

Evidence of pulmonary hypertension with enlarged main pulmonary
artery.

Emphysema (7GVTT-0AV.A).

Mild aortic atherosclerosis and associated coronary artery disease.
Aortic Atherosclerosis (7GVTT-9CE.E).

Osteopenia with multiple chronic compression fracture as above.

## 2019-03-20 IMAGING — DX DG CHEST 1V PORT
1 series · 1 of 1 positions shown · non-contrast
Comparison: 08/21/2017

CLINICAL DATA: Shortness of Breath

EXAM:
PORTABLE CHEST 1 VIEW

[chest ap]
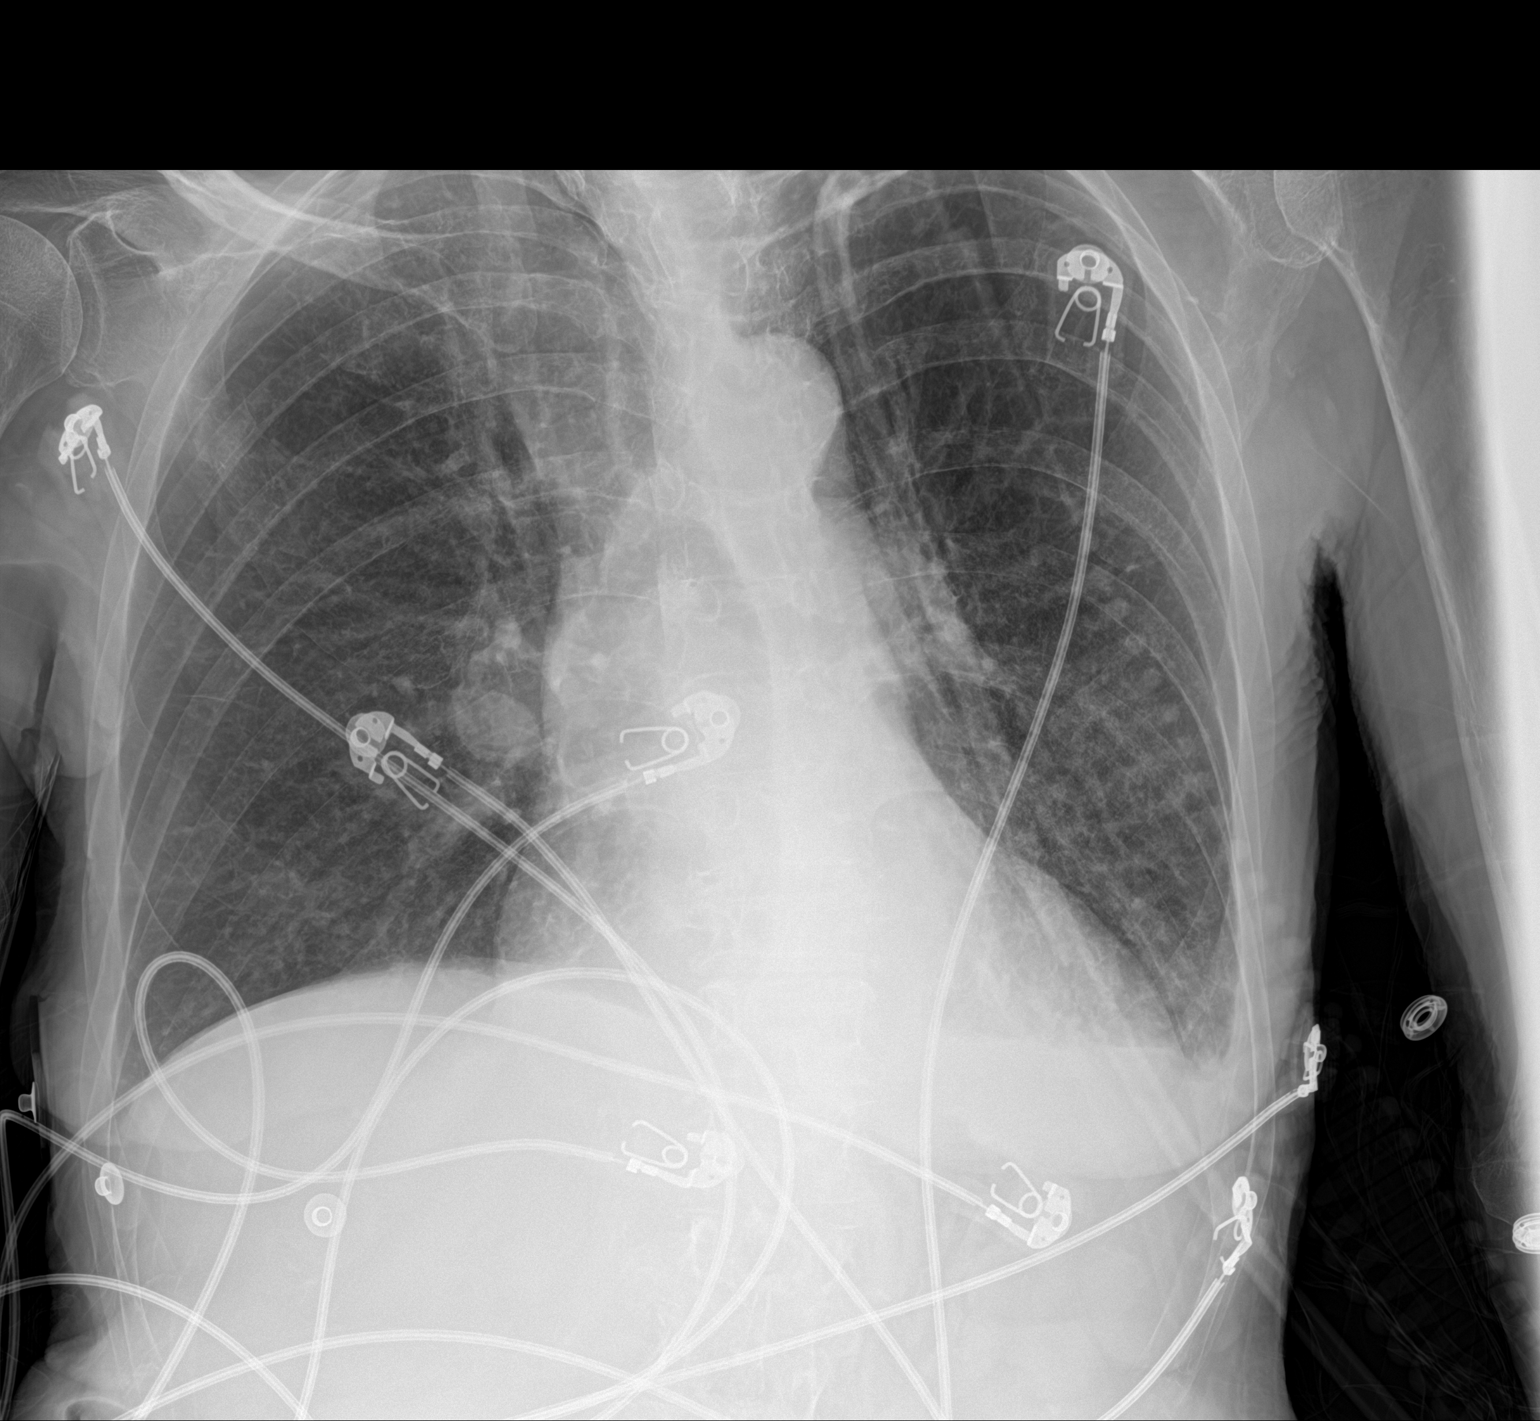

[1 of 1 positions shown; findings below may reference images not displayed]

FINDINGS: Cardiac shadow is enlarged but stable. Aortic calcifications are
again seen. The lungs are well aerated bilaterally. No focal
infiltrate or sizable effusion is seen. Chronic blunting of left
costophrenic angle is noted.
IMPRESSION: No acute abnormality noted.

## 2019-03-25 MED FILL — FOLIC ACID 1 MG TABS: 1 | 90 days supply | Qty: 90 | Fill #0

## 2019-03-26 MED FILL — SPIRONOLACTONE 25 MG TABS: 25 | 90 days supply | Qty: 45 | Fill #1

## 2019-04-06 MED FILL — ATORVASTATIN 10 MG TABLET: 10 | 90 days supply | Qty: 90 | Fill #0

## 2019-04-12 MED FILL — CARVEDILOL 3.125 MG TABLET: 3.125 | 90 days supply | Qty: 180 | Fill #0

## 2019-04-13 MED FILL — methIMAzole 5 MG TABS: 5 | 30 days supply | Qty: 30 | Fill #0

## 2019-04-26 MED FILL — FUROSEMIDE 20 MG TABS: 20 | 90 days supply | Qty: 90 | Fill #0

## 2019-04-28 ENCOUNTER — Encounter (HOSPITAL_COMMUNITY): Payer: Medicare Other | Admitting: Internal Medicine

## 2019-04-28 ENCOUNTER — Other Ambulatory Visit: Payer: Self-pay

## 2019-04-28 ENCOUNTER — Ambulatory Visit (HOSPITAL_COMMUNITY)
Admission: RE | Admit: 2019-04-28 | Discharge: 2019-04-28 | Disposition: A | Discharge status: Home / Self Care | Payer: Medicare Other | Source: Ambulatory Visit | Attending: Internal Medicine | Admitting: Internal Medicine

## 2019-04-28 DIAGNOSIS — I5032 Chronic diastolic (congestive) heart failure: Secondary | ICD-10-CM

## 2019-04-28 NOTE — Progress Notes (Signed)
Patient placed on recall AVS mailed to home.

## 2019-04-28 NOTE — Patient Instructions (Signed)
Follow up and echo in 6 months   

## 2019-04-28 NOTE — Progress Notes (Signed)
Heart Failure TeleHealth Note  Due to national recommendations of social distancing due to COVID 19, Audio/video telehealth visit is felt to be most appropriate for this patient at this time.  See MyChart message from today for patient consent regarding telehealth for [facility].  Date:  04/28/2019   ID:  Rachel Williams, DOB 07/04/1950, MRN [id number]  Location: Home  Provider location: [facility] Type of Visit: Established patient  PCP:  Sigmund Hazel, MD  Cardiologist:  No primary care provider on file. Primary HF: Ambry Dix  Chief Complaint: Heart Failure follow-up   History of Present Illness:  HPI: Rachel Williams is a 69 y.o. female (who retired form [employer]) with a history of pulmonary HTN, COPD, current smoker, current ETOH use with a history of ETOH abuse, and tricuspid regurgitation  She was hospitalized 3/19-3/21/2019 for acute diastolic HF and PNA. She was diuresed with IV lasix, then transitioned to lasix 40 mg daily. Echo during that admission showed EF 60-65%, grade 1 DD, markedly dilted RV with mild RV dysfunction, mod-severe TR, and PA peak pressure of 76 mmHg. She required a thoracentesis on right side. Discharged to ALF. DC weight 140 lbs.   She saw Dr Donnie Aho 07/17/17. He ordered a V/Q scan to evaluate for cause of pulmonary HTN and referred her to advanced HF clinic for further evaluation and management.   Remains at [facility] in Assisted Living. Remains very active (as permitted by COVID). Has received first COVID vaccine. Denies SOB. Smoking about 1/2 ppd. No edema, orthopnea or PND. Most recently BP 149/90. SBP usually 120s   Echo 06/25/17 - Left ventricle: The cavity size was normal. Wall thickness was normal. Systolic function was normal. The estimated ejection fraction was in the range of 60% to 65%. Wall motion was normal; there were no regional wall motion abnormalities.  Doppler parameters are consistent with abnormal left ventricular relaxation (grade 1 diastolic dysfunction). - Ventricular septum: The contour showed diastolic flattening and systolic flattening. - Left atrium: The atrium was mildly dilated. - Right ventricle: The cavity size was moderately dilated. Systolic function was mildly reduced. - Right atrium: The atrium was moderately dilated. - Tricuspid valve: There was moderate-severe regurgitation. - Pulmonary arteries: Systolic pressure was severely increased. PA peak pressure: 76 mm Hg (S). who presents via Web designer for a telehealth visit today.     Rachel Williams denies symptoms worrisome for COVID 19.   Past Medical History:  Diagnosis Date  . Acute CHF (congestive heart failure) (HCC) 06/24/2017  . ETOH abuse   . Pneumonia    Past Surgical History:  Procedure Laterality Date  . COLON RESECTION     perforation  . COLOSTOMY    . IR IMAGE GUIDED DRAINAGE PERCUT CATH  PERITONEAL RETROPERIT  08/15/2017  . IR PARACENTESIS  08/15/2017  . IR PARACENTESIS  08/20/2017  . IR PARACENTESIS  08/26/2017     Current Outpatient Medications  Medication Sig Dispense Refill  . folic acid (FOLVITE) 1 MG tablet Take 1 tablet (1 mg total) by mouth daily. 30 tablet 0  . furosemide (LASIX) 20 MG tablet Take 1 tablet (20 mg total) by mouth 2 (two) times daily. 60 tablet 0  . methimazole (TAPAZOLE) 10 MG tablet Take 1 tablet (10 mg total) by mouth 2 (two) times daily. 60 tablet 0  . metoprolol tartrate (LOPRESSOR) 25 MG tablet Take 0.5 tablets (12.5 mg total) by mouth every 8 (eight)  hours. 90 tablet 0  . nicotine (NICODERM CQ - DOSED IN MG/24 HOURS) 14 mg/24hr patch Place 1 patch (14 mg total) onto the skin daily. (Patient not taking: Reported on 05/04/2018) 28 patch 0  . spironolactone (ALDACTONE) 25 MG tablet Take 0.5 tablets (12.5 mg total) by mouth daily. 15 tablet 6  . thiamine 100 MG tablet Take 1 tablet (100 mg total) by  mouth daily. (Patient not taking: Reported on 07/30/2017) 30 tablet 0   No current facility-administered medications for this encounter.    Allergies:   Ciprofloxacin, Levofloxacin, Nickel, and Penicillins   Social History:  The patient  reports that she has been smoking cigarettes. She has a 50.00 pack-year smoking history. She has never used smokeless tobacco. She reports current alcohol use of about 14.0 standard drinks of alcohol per week. She reports that she does not use drugs.   Family History:  The patient's family history includes Alzheimer's disease in her mother; Cancer in her sister; Cirrhosis in her father; Hemochromatosis in an other family member.   ROS:  Please see the history of present illness.   All other systems are personally reviewed and negative.   Exam:  (Video/Tele Health Call; Exam is subjective and or/visual.) General:  Speaks in full sentences. No resp difficulty. Lungs: Normal respiratory effort with conversation.  Abdomen: Non-distended per patient report Extremities: Pt denies edema. Neuro: Alert & oriented x 3.   Recent Labs: 05/04/2018: B Natriuretic Peptide 2,819.7; TSH <0.010 05/06/2018: ALT 26; BUN 22; Creatinine, Ser 0.52; Hemoglobin 10.9; Magnesium 1.9; Platelets 152; Potassium 3.3; Sodium 135  Personally reviewed   Wt Readings from Last 3 Encounters:  05/06/18 42.4 kg (93 lb 7.6 oz)  08/15/17 46.7 kg (103 lb)  07/30/17 50.7 kg (111 lb 12.8 oz)      ASSESSMENT AND PLAN:  1. Pulmonary HTN. PA peak pressure 76 mm Hg on Echo 06/2017 - VQ scan with normal perfusion, abnormal ventilation favoring parenchymal lung disease - She is uninterested in proceeding with any further testing at this point including RHC. - We have agreed to proceed with rvisit and repeat echo in 6 months when COVID hopefully less prevalent.    2. Chronic diastolic HF. Echo 05/2015: EF 60-65%, trivial MR, severely reduced RV, mod dilation of RA, mild TR, PA peak pressure 56.  Echo 06/2017: EF 60-65%, grade I DD, RV mildly reduced, mod RA dilation, mild LA dilation, mod-severe TR, PA peak pressure 76 mmHg - Stable NYHA II  - Volume status appears stable - Continue lasix 40 mg daily and spiro 12.5 daily   3. Tricuspid regurgitation - Mod to severe on echo 06/2017. Likely due to [facility] - repeat echo in 6 months   4. Aortic atherosclerosis on CT scan - No s/s ischemia  5. Tobacco use - Smokes 1/2 ppd - Not planning on quitting currently  COVID screen The patient does not have any symptoms that suggest any further testing/ screening at this time.  Social distancing reinforced today.  Recommended follow-up:  As above  Relevant cardiac medications were reviewed at length with the patient today.   The patient does not have concerns regarding their medications at this time.   The following changes were made today:  As above  Today, I have spent 14 minutes with the patient with telehealth technology discussing the above issues .    Signed, Glori Bickers, MD  04/28/2019 10:54 AM  [facility] [address] [facility]  [place] [address] [phone] (office) [phone] (fax)

## 2019-04-29 MED FILL — methIMAzole 5 MG TABS: 5 | 30 days supply | Qty: 30 | Fill #0

## 2019-05-27 MED FILL — methIMAzole 5 MG TABS: 5 | 30 days supply | Qty: 30 | Fill #1

## 2019-06-11 MED FILL — methIMAzole 5 MG TABS: 5 | 30 days supply | Qty: 45 | Fill #0

## 2019-06-17 MED FILL — FOLIC ACID 1 MG TABS: 1 | 90 days supply | Qty: 90 | Fill #0

## 2019-06-21 MED FILL — methIMAzole 5 MG TABS: 5 | 30 days supply | Qty: 45 | Fill #0

## 2019-06-24 MED FILL — SPIRONOLACTONE 25 MG TABS: 25 | 90 days supply | Qty: 45 | Fill #0

## 2019-07-03 MED FILL — FUROSEMIDE 20 MG TABS: 20 | 90 days supply | Qty: 90 | Fill #0

## 2019-10-26 ENCOUNTER — Other Ambulatory Visit: Payer: Self-pay | Admitting: Family Medicine

## 2020-04-12 DIAGNOSIS — I272 Pulmonary hypertension, unspecified: Secondary | ICD-10-CM | POA: Diagnosis not present

## 2020-04-12 DIAGNOSIS — D649 Anemia, unspecified: Secondary | ICD-10-CM | POA: Diagnosis not present

## 2020-04-12 DIAGNOSIS — I5031 Acute diastolic (congestive) heart failure: Secondary | ICD-10-CM | POA: Diagnosis not present

## 2020-04-12 DIAGNOSIS — G8929 Other chronic pain: Secondary | ICD-10-CM | POA: Diagnosis not present

## 2020-04-12 DIAGNOSIS — E782 Mixed hyperlipidemia: Secondary | ICD-10-CM | POA: Diagnosis not present

## 2020-04-12 DIAGNOSIS — I10 Essential (primary) hypertension: Secondary | ICD-10-CM | POA: Diagnosis not present

## 2020-05-05 DIAGNOSIS — E0590 Thyrotoxicosis, unspecified without thyrotoxic crisis or storm: Secondary | ICD-10-CM | POA: Diagnosis not present

## 2020-05-10 DIAGNOSIS — D649 Anemia, unspecified: Secondary | ICD-10-CM | POA: Diagnosis not present

## 2020-05-10 DIAGNOSIS — I272 Pulmonary hypertension, unspecified: Secondary | ICD-10-CM | POA: Diagnosis not present

## 2020-05-10 DIAGNOSIS — G8929 Other chronic pain: Secondary | ICD-10-CM | POA: Diagnosis not present

## 2020-05-10 DIAGNOSIS — E782 Mixed hyperlipidemia: Secondary | ICD-10-CM | POA: Diagnosis not present

## 2020-05-10 DIAGNOSIS — I10 Essential (primary) hypertension: Secondary | ICD-10-CM | POA: Diagnosis not present

## 2020-05-10 DIAGNOSIS — I5031 Acute diastolic (congestive) heart failure: Secondary | ICD-10-CM | POA: Diagnosis not present

## 2020-05-17 DIAGNOSIS — Z720 Tobacco use: Secondary | ICD-10-CM | POA: Diagnosis not present

## 2020-05-17 DIAGNOSIS — M8000XA Age-related osteoporosis with current pathological fracture, unspecified site, initial encounter for fracture: Secondary | ICD-10-CM | POA: Diagnosis not present

## 2020-05-17 DIAGNOSIS — J42 Unspecified chronic bronchitis: Secondary | ICD-10-CM | POA: Diagnosis not present

## 2020-05-17 DIAGNOSIS — I509 Heart failure, unspecified: Secondary | ICD-10-CM | POA: Diagnosis not present

## 2020-05-17 DIAGNOSIS — E0590 Thyrotoxicosis, unspecified without thyrotoxic crisis or storm: Secondary | ICD-10-CM | POA: Diagnosis not present

## 2020-06-09 DIAGNOSIS — I5031 Acute diastolic (congestive) heart failure: Secondary | ICD-10-CM | POA: Diagnosis not present

## 2020-06-09 DIAGNOSIS — E782 Mixed hyperlipidemia: Secondary | ICD-10-CM | POA: Diagnosis not present

## 2020-06-09 DIAGNOSIS — D649 Anemia, unspecified: Secondary | ICD-10-CM | POA: Diagnosis not present

## 2020-06-09 DIAGNOSIS — I272 Pulmonary hypertension, unspecified: Secondary | ICD-10-CM | POA: Diagnosis not present

## 2020-06-09 DIAGNOSIS — G8929 Other chronic pain: Secondary | ICD-10-CM | POA: Diagnosis not present

## 2020-06-09 DIAGNOSIS — I10 Essential (primary) hypertension: Secondary | ICD-10-CM | POA: Diagnosis not present

## 2020-07-20 DIAGNOSIS — E0590 Thyrotoxicosis, unspecified without thyrotoxic crisis or storm: Secondary | ICD-10-CM | POA: Diagnosis not present

## 2020-07-25 DIAGNOSIS — J42 Unspecified chronic bronchitis: Secondary | ICD-10-CM | POA: Diagnosis not present

## 2020-07-25 DIAGNOSIS — M8000XA Age-related osteoporosis with current pathological fracture, unspecified site, initial encounter for fracture: Secondary | ICD-10-CM | POA: Diagnosis not present

## 2020-07-25 DIAGNOSIS — E871 Hypo-osmolality and hyponatremia: Secondary | ICD-10-CM | POA: Diagnosis not present

## 2020-07-25 DIAGNOSIS — I509 Heart failure, unspecified: Secondary | ICD-10-CM | POA: Diagnosis not present

## 2020-07-25 DIAGNOSIS — Z720 Tobacco use: Secondary | ICD-10-CM | POA: Diagnosis not present

## 2020-07-25 DIAGNOSIS — E0590 Thyrotoxicosis, unspecified without thyrotoxic crisis or storm: Secondary | ICD-10-CM | POA: Diagnosis not present

## 2020-09-06 DIAGNOSIS — E0590 Thyrotoxicosis, unspecified without thyrotoxic crisis or storm: Secondary | ICD-10-CM | POA: Diagnosis not present

## 2020-09-11 DIAGNOSIS — J42 Unspecified chronic bronchitis: Secondary | ICD-10-CM | POA: Diagnosis not present

## 2020-09-11 DIAGNOSIS — E0590 Thyrotoxicosis, unspecified without thyrotoxic crisis or storm: Secondary | ICD-10-CM | POA: Diagnosis not present

## 2020-09-11 DIAGNOSIS — M8000XA Age-related osteoporosis with current pathological fracture, unspecified site, initial encounter for fracture: Secondary | ICD-10-CM | POA: Diagnosis not present

## 2020-09-11 DIAGNOSIS — Z720 Tobacco use: Secondary | ICD-10-CM | POA: Diagnosis not present

## 2020-09-11 DIAGNOSIS — E871 Hypo-osmolality and hyponatremia: Secondary | ICD-10-CM | POA: Diagnosis not present

## 2020-09-11 DIAGNOSIS — I509 Heart failure, unspecified: Secondary | ICD-10-CM | POA: Diagnosis not present

## 2020-09-22 DIAGNOSIS — I10 Essential (primary) hypertension: Secondary | ICD-10-CM | POA: Diagnosis not present

## 2020-09-22 DIAGNOSIS — I5031 Acute diastolic (congestive) heart failure: Secondary | ICD-10-CM | POA: Diagnosis not present

## 2020-09-22 DIAGNOSIS — E782 Mixed hyperlipidemia: Secondary | ICD-10-CM | POA: Diagnosis not present

## 2020-09-22 DIAGNOSIS — D649 Anemia, unspecified: Secondary | ICD-10-CM | POA: Diagnosis not present

## 2020-09-22 DIAGNOSIS — I272 Pulmonary hypertension, unspecified: Secondary | ICD-10-CM | POA: Diagnosis not present

## 2020-09-22 DIAGNOSIS — G8929 Other chronic pain: Secondary | ICD-10-CM | POA: Diagnosis not present

## 2020-10-06 DIAGNOSIS — E0590 Thyrotoxicosis, unspecified without thyrotoxic crisis or storm: Secondary | ICD-10-CM | POA: Diagnosis not present

## 2020-10-06 DIAGNOSIS — G8929 Other chronic pain: Secondary | ICD-10-CM | POA: Diagnosis not present

## 2020-10-06 DIAGNOSIS — E871 Hypo-osmolality and hyponatremia: Secondary | ICD-10-CM | POA: Diagnosis not present

## 2020-10-06 DIAGNOSIS — E782 Mixed hyperlipidemia: Secondary | ICD-10-CM | POA: Diagnosis not present

## 2020-10-06 DIAGNOSIS — I10 Essential (primary) hypertension: Secondary | ICD-10-CM | POA: Diagnosis not present

## 2020-10-06 DIAGNOSIS — D649 Anemia, unspecified: Secondary | ICD-10-CM | POA: Diagnosis not present

## 2020-10-06 DIAGNOSIS — I272 Pulmonary hypertension, unspecified: Secondary | ICD-10-CM | POA: Diagnosis not present

## 2020-10-06 DIAGNOSIS — I5031 Acute diastolic (congestive) heart failure: Secondary | ICD-10-CM | POA: Diagnosis not present

## 2020-10-12 DIAGNOSIS — E871 Hypo-osmolality and hyponatremia: Secondary | ICD-10-CM | POA: Diagnosis not present

## 2020-10-12 DIAGNOSIS — M8000XA Age-related osteoporosis with current pathological fracture, unspecified site, initial encounter for fracture: Secondary | ICD-10-CM | POA: Diagnosis not present

## 2020-10-12 DIAGNOSIS — I509 Heart failure, unspecified: Secondary | ICD-10-CM | POA: Diagnosis not present

## 2020-10-12 DIAGNOSIS — Z720 Tobacco use: Secondary | ICD-10-CM | POA: Diagnosis not present

## 2020-10-12 DIAGNOSIS — J42 Unspecified chronic bronchitis: Secondary | ICD-10-CM | POA: Diagnosis not present

## 2020-10-12 DIAGNOSIS — E0590 Thyrotoxicosis, unspecified without thyrotoxic crisis or storm: Secondary | ICD-10-CM | POA: Diagnosis not present

## 2020-11-08 DIAGNOSIS — E0590 Thyrotoxicosis, unspecified without thyrotoxic crisis or storm: Secondary | ICD-10-CM | POA: Diagnosis not present

## 2020-11-08 DIAGNOSIS — E871 Hypo-osmolality and hyponatremia: Secondary | ICD-10-CM | POA: Diagnosis not present

## 2020-11-13 DIAGNOSIS — M8000XA Age-related osteoporosis with current pathological fracture, unspecified site, initial encounter for fracture: Secondary | ICD-10-CM | POA: Diagnosis not present

## 2020-11-13 DIAGNOSIS — E0590 Thyrotoxicosis, unspecified without thyrotoxic crisis or storm: Secondary | ICD-10-CM | POA: Diagnosis not present

## 2020-11-13 DIAGNOSIS — Z720 Tobacco use: Secondary | ICD-10-CM | POA: Diagnosis not present

## 2020-11-13 DIAGNOSIS — J42 Unspecified chronic bronchitis: Secondary | ICD-10-CM | POA: Diagnosis not present

## 2020-11-13 DIAGNOSIS — I509 Heart failure, unspecified: Secondary | ICD-10-CM | POA: Diagnosis not present

## 2020-11-13 DIAGNOSIS — E871 Hypo-osmolality and hyponatremia: Secondary | ICD-10-CM | POA: Diagnosis not present

## 2020-11-15 DIAGNOSIS — E871 Hypo-osmolality and hyponatremia: Secondary | ICD-10-CM | POA: Diagnosis not present

## 2020-12-08 DIAGNOSIS — D649 Anemia, unspecified: Secondary | ICD-10-CM | POA: Diagnosis not present

## 2020-12-08 DIAGNOSIS — G8929 Other chronic pain: Secondary | ICD-10-CM | POA: Diagnosis not present

## 2020-12-08 DIAGNOSIS — I272 Pulmonary hypertension, unspecified: Secondary | ICD-10-CM | POA: Diagnosis not present

## 2020-12-08 DIAGNOSIS — I10 Essential (primary) hypertension: Secondary | ICD-10-CM | POA: Diagnosis not present

## 2020-12-08 DIAGNOSIS — E782 Mixed hyperlipidemia: Secondary | ICD-10-CM | POA: Diagnosis not present

## 2020-12-08 DIAGNOSIS — I5031 Acute diastolic (congestive) heart failure: Secondary | ICD-10-CM | POA: Diagnosis not present

## 2020-12-22 DIAGNOSIS — I10 Essential (primary) hypertension: Secondary | ICD-10-CM | POA: Diagnosis not present

## 2020-12-22 DIAGNOSIS — I272 Pulmonary hypertension, unspecified: Secondary | ICD-10-CM | POA: Diagnosis not present

## 2020-12-22 DIAGNOSIS — E559 Vitamin D deficiency, unspecified: Secondary | ICD-10-CM | POA: Diagnosis not present

## 2020-12-22 DIAGNOSIS — Z720 Tobacco use: Secondary | ICD-10-CM | POA: Diagnosis not present

## 2020-12-22 DIAGNOSIS — E0590 Thyrotoxicosis, unspecified without thyrotoxic crisis or storm: Secondary | ICD-10-CM | POA: Diagnosis not present

## 2021-01-17 DIAGNOSIS — M8000XA Age-related osteoporosis with current pathological fracture, unspecified site, initial encounter for fracture: Secondary | ICD-10-CM | POA: Diagnosis not present

## 2021-01-17 DIAGNOSIS — Z720 Tobacco use: Secondary | ICD-10-CM | POA: Diagnosis not present

## 2021-01-17 DIAGNOSIS — E0590 Thyrotoxicosis, unspecified without thyrotoxic crisis or storm: Secondary | ICD-10-CM | POA: Diagnosis not present

## 2021-01-17 DIAGNOSIS — I509 Heart failure, unspecified: Secondary | ICD-10-CM | POA: Diagnosis not present

## 2021-01-17 DIAGNOSIS — J42 Unspecified chronic bronchitis: Secondary | ICD-10-CM | POA: Diagnosis not present

## 2021-01-17 DIAGNOSIS — E871 Hypo-osmolality and hyponatremia: Secondary | ICD-10-CM | POA: Diagnosis not present

## 2021-03-08 DIAGNOSIS — G8929 Other chronic pain: Secondary | ICD-10-CM | POA: Diagnosis not present

## 2021-03-08 DIAGNOSIS — I10 Essential (primary) hypertension: Secondary | ICD-10-CM | POA: Diagnosis not present

## 2021-03-08 DIAGNOSIS — E782 Mixed hyperlipidemia: Secondary | ICD-10-CM | POA: Diagnosis not present

## 2021-03-08 DIAGNOSIS — I5031 Acute diastolic (congestive) heart failure: Secondary | ICD-10-CM | POA: Diagnosis not present

## 2021-03-08 DIAGNOSIS — D649 Anemia, unspecified: Secondary | ICD-10-CM | POA: Diagnosis not present

## 2021-03-08 DIAGNOSIS — I272 Pulmonary hypertension, unspecified: Secondary | ICD-10-CM | POA: Diagnosis not present

## 2021-03-27 DIAGNOSIS — J069 Acute upper respiratory infection, unspecified: Secondary | ICD-10-CM | POA: Diagnosis not present

## 2021-03-27 DIAGNOSIS — J209 Acute bronchitis, unspecified: Secondary | ICD-10-CM | POA: Diagnosis not present

## 2021-04-06 DIAGNOSIS — E0590 Thyrotoxicosis, unspecified without thyrotoxic crisis or storm: Secondary | ICD-10-CM | POA: Diagnosis not present

## 2021-04-30 DIAGNOSIS — M8000XA Age-related osteoporosis with current pathological fracture, unspecified site, initial encounter for fracture: Secondary | ICD-10-CM | POA: Diagnosis not present

## 2021-04-30 DIAGNOSIS — E0590 Thyrotoxicosis, unspecified without thyrotoxic crisis or storm: Secondary | ICD-10-CM | POA: Diagnosis not present

## 2021-04-30 DIAGNOSIS — J42 Unspecified chronic bronchitis: Secondary | ICD-10-CM | POA: Diagnosis not present

## 2021-04-30 DIAGNOSIS — Z6824 Body mass index (BMI) 24.0-24.9, adult: Secondary | ICD-10-CM | POA: Diagnosis not present

## 2021-04-30 DIAGNOSIS — E871 Hypo-osmolality and hyponatremia: Secondary | ICD-10-CM | POA: Diagnosis not present

## 2021-04-30 DIAGNOSIS — I509 Heart failure, unspecified: Secondary | ICD-10-CM | POA: Diagnosis not present

## 2021-04-30 DIAGNOSIS — Z720 Tobacco use: Secondary | ICD-10-CM | POA: Diagnosis not present

## 2021-06-18 DIAGNOSIS — R112 Nausea with vomiting, unspecified: Secondary | ICD-10-CM | POA: Diagnosis not present

## 2021-06-18 DIAGNOSIS — R0982 Postnasal drip: Secondary | ICD-10-CM | POA: Diagnosis not present

## 2021-08-02 DIAGNOSIS — E559 Vitamin D deficiency, unspecified: Secondary | ICD-10-CM | POA: Diagnosis not present

## 2021-08-02 DIAGNOSIS — E0590 Thyrotoxicosis, unspecified without thyrotoxic crisis or storm: Secondary | ICD-10-CM | POA: Diagnosis not present

## 2021-09-13 DIAGNOSIS — E782 Mixed hyperlipidemia: Secondary | ICD-10-CM | POA: Diagnosis not present

## 2021-09-13 DIAGNOSIS — I5031 Acute diastolic (congestive) heart failure: Secondary | ICD-10-CM | POA: Diagnosis not present

## 2021-09-13 DIAGNOSIS — I10 Essential (primary) hypertension: Secondary | ICD-10-CM | POA: Diagnosis not present

## 2021-09-13 DIAGNOSIS — G8929 Other chronic pain: Secondary | ICD-10-CM | POA: Diagnosis not present

## 2021-12-04 DIAGNOSIS — H6121 Impacted cerumen, right ear: Secondary | ICD-10-CM | POA: Diagnosis not present

## 2021-12-04 DIAGNOSIS — I700 Atherosclerosis of aorta: Secondary | ICD-10-CM | POA: Diagnosis not present

## 2021-12-04 DIAGNOSIS — E559 Vitamin D deficiency, unspecified: Secondary | ICD-10-CM | POA: Diagnosis not present

## 2021-12-04 DIAGNOSIS — Z0000 Encounter for general adult medical examination without abnormal findings: Secondary | ICD-10-CM | POA: Diagnosis not present

## 2021-12-04 DIAGNOSIS — E0590 Thyrotoxicosis, unspecified without thyrotoxic crisis or storm: Secondary | ICD-10-CM | POA: Diagnosis not present

## 2021-12-04 DIAGNOSIS — F17210 Nicotine dependence, cigarettes, uncomplicated: Secondary | ICD-10-CM | POA: Diagnosis not present

## 2021-12-04 DIAGNOSIS — I10 Essential (primary) hypertension: Secondary | ICD-10-CM | POA: Diagnosis not present

## 2022-03-25 DIAGNOSIS — J069 Acute upper respiratory infection, unspecified: Secondary | ICD-10-CM | POA: Diagnosis not present

## 2022-03-25 DIAGNOSIS — R110 Nausea: Secondary | ICD-10-CM | POA: Diagnosis not present

## 2022-03-29 DIAGNOSIS — R112 Nausea with vomiting, unspecified: Secondary | ICD-10-CM | POA: Diagnosis not present

## 2022-05-08 DIAGNOSIS — E0590 Thyrotoxicosis, unspecified without thyrotoxic crisis or storm: Secondary | ICD-10-CM | POA: Diagnosis not present

## 2022-05-08 DIAGNOSIS — I5031 Acute diastolic (congestive) heart failure: Secondary | ICD-10-CM | POA: Diagnosis not present

## 2022-05-08 DIAGNOSIS — I10 Essential (primary) hypertension: Secondary | ICD-10-CM | POA: Diagnosis not present

## 2022-05-08 DIAGNOSIS — E782 Mixed hyperlipidemia: Secondary | ICD-10-CM | POA: Diagnosis not present

## 2022-05-31 DIAGNOSIS — I5031 Acute diastolic (congestive) heart failure: Secondary | ICD-10-CM | POA: Diagnosis not present

## 2022-05-31 DIAGNOSIS — E0590 Thyrotoxicosis, unspecified without thyrotoxic crisis or storm: Secondary | ICD-10-CM | POA: Diagnosis not present

## 2022-05-31 DIAGNOSIS — I10 Essential (primary) hypertension: Secondary | ICD-10-CM | POA: Diagnosis not present

## 2022-05-31 DIAGNOSIS — I272 Pulmonary hypertension, unspecified: Secondary | ICD-10-CM | POA: Diagnosis not present

## 2022-05-31 DIAGNOSIS — E782 Mixed hyperlipidemia: Secondary | ICD-10-CM | POA: Diagnosis not present

## 2022-12-23 DIAGNOSIS — I509 Heart failure, unspecified: Secondary | ICD-10-CM | POA: Diagnosis not present

## 2022-12-23 DIAGNOSIS — E559 Vitamin D deficiency, unspecified: Secondary | ICD-10-CM | POA: Diagnosis not present

## 2022-12-23 DIAGNOSIS — Z0000 Encounter for general adult medical examination without abnormal findings: Secondary | ICD-10-CM | POA: Diagnosis not present

## 2022-12-23 DIAGNOSIS — E0590 Thyrotoxicosis, unspecified without thyrotoxic crisis or storm: Secondary | ICD-10-CM | POA: Diagnosis not present

## 2022-12-23 DIAGNOSIS — R739 Hyperglycemia, unspecified: Secondary | ICD-10-CM | POA: Diagnosis not present

## 2022-12-23 DIAGNOSIS — I10 Essential (primary) hypertension: Secondary | ICD-10-CM | POA: Diagnosis not present

## 2022-12-23 DIAGNOSIS — Z23 Encounter for immunization: Secondary | ICD-10-CM | POA: Diagnosis not present

## 2022-12-23 DIAGNOSIS — I361 Nonrheumatic tricuspid (valve) insufficiency: Secondary | ICD-10-CM | POA: Diagnosis not present

## 2022-12-23 DIAGNOSIS — F17210 Nicotine dependence, cigarettes, uncomplicated: Secondary | ICD-10-CM | POA: Diagnosis not present

## 2022-12-23 DIAGNOSIS — E782 Mixed hyperlipidemia: Secondary | ICD-10-CM | POA: Diagnosis not present

## 2022-12-23 DIAGNOSIS — I700 Atherosclerosis of aorta: Secondary | ICD-10-CM | POA: Diagnosis not present

## 2023-03-03 DIAGNOSIS — E559 Vitamin D deficiency, unspecified: Secondary | ICD-10-CM | POA: Diagnosis not present

## 2023-03-03 DIAGNOSIS — J439 Emphysema, unspecified: Secondary | ICD-10-CM | POA: Diagnosis not present

## 2023-03-03 DIAGNOSIS — F17210 Nicotine dependence, cigarettes, uncomplicated: Secondary | ICD-10-CM | POA: Diagnosis not present

## 2023-03-03 DIAGNOSIS — I10 Essential (primary) hypertension: Secondary | ICD-10-CM | POA: Diagnosis not present

## 2023-03-03 DIAGNOSIS — E0500 Thyrotoxicosis with diffuse goiter without thyrotoxic crisis or storm: Secondary | ICD-10-CM | POA: Diagnosis not present

## 2023-03-03 DIAGNOSIS — I071 Rheumatic tricuspid insufficiency: Secondary | ICD-10-CM | POA: Diagnosis not present

## 2023-03-03 DIAGNOSIS — I110 Hypertensive heart disease with heart failure: Secondary | ICD-10-CM | POA: Diagnosis not present

## 2023-03-03 DIAGNOSIS — Z23 Encounter for immunization: Secondary | ICD-10-CM | POA: Diagnosis not present

## 2023-03-03 DIAGNOSIS — I5032 Chronic diastolic (congestive) heart failure: Secondary | ICD-10-CM | POA: Diagnosis not present

## 2023-03-03 DIAGNOSIS — I700 Atherosclerosis of aorta: Secondary | ICD-10-CM | POA: Diagnosis not present

## 2023-03-03 DIAGNOSIS — K7460 Unspecified cirrhosis of liver: Secondary | ICD-10-CM | POA: Diagnosis not present

## 2023-04-21 DIAGNOSIS — E0500 Thyrotoxicosis with diffuse goiter without thyrotoxic crisis or storm: Secondary | ICD-10-CM | POA: Diagnosis not present

## 2023-06-23 DIAGNOSIS — F17210 Nicotine dependence, cigarettes, uncomplicated: Secondary | ICD-10-CM | POA: Diagnosis not present

## 2023-06-23 DIAGNOSIS — E0500 Thyrotoxicosis with diffuse goiter without thyrotoxic crisis or storm: Secondary | ICD-10-CM | POA: Diagnosis not present

## 2023-06-23 DIAGNOSIS — I10 Essential (primary) hypertension: Secondary | ICD-10-CM | POA: Diagnosis not present

## 2023-06-23 DIAGNOSIS — E559 Vitamin D deficiency, unspecified: Secondary | ICD-10-CM | POA: Diagnosis not present

## 2023-06-23 DIAGNOSIS — E871 Hypo-osmolality and hyponatremia: Secondary | ICD-10-CM | POA: Diagnosis not present

## 2023-12-29 DIAGNOSIS — Z0000 Encounter for general adult medical examination without abnormal findings: Secondary | ICD-10-CM | POA: Diagnosis not present

## 2023-12-29 DIAGNOSIS — Z23 Encounter for immunization: Secondary | ICD-10-CM | POA: Diagnosis not present

## 2024-01-05 DIAGNOSIS — J439 Emphysema, unspecified: Secondary | ICD-10-CM | POA: Diagnosis not present

## 2024-01-05 DIAGNOSIS — I10 Essential (primary) hypertension: Secondary | ICD-10-CM | POA: Diagnosis not present

## 2024-01-05 DIAGNOSIS — E0500 Thyrotoxicosis with diffuse goiter without thyrotoxic crisis or storm: Secondary | ICD-10-CM | POA: Diagnosis not present

## 2024-01-05 DIAGNOSIS — K7460 Unspecified cirrhosis of liver: Secondary | ICD-10-CM | POA: Diagnosis not present

## 2024-01-05 DIAGNOSIS — E559 Vitamin D deficiency, unspecified: Secondary | ICD-10-CM | POA: Diagnosis not present

## 2024-01-05 DIAGNOSIS — E871 Hypo-osmolality and hyponatremia: Secondary | ICD-10-CM | POA: Diagnosis not present

## 2024-01-05 DIAGNOSIS — I5032 Chronic diastolic (congestive) heart failure: Secondary | ICD-10-CM | POA: Diagnosis not present

## 2024-01-05 DIAGNOSIS — F17210 Nicotine dependence, cigarettes, uncomplicated: Secondary | ICD-10-CM | POA: Diagnosis not present

## 2024-01-05 DIAGNOSIS — Z9989 Dependence on other enabling machines and devices: Secondary | ICD-10-CM | POA: Diagnosis not present

## 2024-01-05 DIAGNOSIS — I071 Rheumatic tricuspid insufficiency: Secondary | ICD-10-CM | POA: Diagnosis not present
